# Patient Record
Sex: Male | Born: 1945 | Race: White | Hispanic: No | Marital: Married | Smoking: Former smoker
Health system: Southern US, Community
[De-identification: ages and names within clinical notes are randomized; demographics above are authoritative.]

## PROBLEM LIST (undated history)

## (undated) DIAGNOSIS — J189 Pneumonia, unspecified organism: Secondary | ICD-10-CM

## (undated) DIAGNOSIS — I10 Essential (primary) hypertension: Secondary | ICD-10-CM

## (undated) DIAGNOSIS — C801 Malignant (primary) neoplasm, unspecified: Secondary | ICD-10-CM

## (undated) DIAGNOSIS — I2510 Atherosclerotic heart disease of native coronary artery without angina pectoris: Secondary | ICD-10-CM

## (undated) DIAGNOSIS — K219 Gastro-esophageal reflux disease without esophagitis: Secondary | ICD-10-CM

## (undated) DIAGNOSIS — R0602 Shortness of breath: Secondary | ICD-10-CM

## (undated) DIAGNOSIS — D141 Benign neoplasm of larynx: Secondary | ICD-10-CM

## (undated) DIAGNOSIS — H9193 Unspecified hearing loss, bilateral: Secondary | ICD-10-CM

## (undated) DIAGNOSIS — R112 Nausea with vomiting, unspecified: Secondary | ICD-10-CM

## (undated) DIAGNOSIS — I259 Chronic ischemic heart disease, unspecified: Secondary | ICD-10-CM

## (undated) DIAGNOSIS — I219 Acute myocardial infarction, unspecified: Secondary | ICD-10-CM

## (undated) DIAGNOSIS — E785 Hyperlipidemia, unspecified: Secondary | ICD-10-CM

## (undated) DIAGNOSIS — E782 Mixed hyperlipidemia: Secondary | ICD-10-CM

## (undated) DIAGNOSIS — K4090 Unilateral inguinal hernia, without obstruction or gangrene, not specified as recurrent: Secondary | ICD-10-CM

## (undated) DIAGNOSIS — J449 Chronic obstructive pulmonary disease, unspecified: Secondary | ICD-10-CM

## (undated) DIAGNOSIS — Z9889 Other specified postprocedural states: Secondary | ICD-10-CM

## (undated) HISTORY — PX: HERNIA REPAIR: SHX51

## (undated) HISTORY — PX: OTHER SURGICAL HISTORY: SHX169

## (undated) HISTORY — PX: CORONARY ARTERY BYPASS GRAFT: SHX141

## (undated) HISTORY — DX: Gastro-esophageal reflux disease without esophagitis: K21.9

## (undated) HISTORY — DX: Chronic ischemic heart disease, unspecified: I25.9

## (undated) HISTORY — DX: Atherosclerotic heart disease of native coronary artery without angina pectoris: I25.10

## (undated) HISTORY — DX: Unilateral inguinal hernia, without obstruction or gangrene, not specified as recurrent: K40.90

## (undated) HISTORY — PX: CARDIAC CATHETERIZATION: SHX172

## (undated) HISTORY — DX: Mixed hyperlipidemia: E78.2

## (undated) HISTORY — DX: Shortness of breath: R06.02

## (undated) HISTORY — DX: Essential (primary) hypertension: I10

## (undated) HISTORY — DX: Hyperlipidemia, unspecified: E78.5

## (undated) HISTORY — DX: Pneumonia, unspecified organism: J18.9

## (undated) HISTORY — DX: Acute myocardial infarction, unspecified: I21.9

## (undated) HISTORY — DX: Benign neoplasm of larynx: D14.1

---

## 1995-12-01 HISTORY — PX: CHOLECYSTECTOMY: SHX55

## 2008-08-22 ENCOUNTER — Emergency Department (HOSPITAL_COMMUNITY): Admission: EM | Admit: 2008-08-22 | Discharge: 2008-08-22 | Payer: Self-pay | Admitting: Emergency Medicine

## 2010-06-09 ENCOUNTER — Ambulatory Visit: Payer: Self-pay | Admitting: Cardiology

## 2010-10-14 ENCOUNTER — Ambulatory Visit: Payer: Self-pay | Admitting: Cardiology

## 2011-02-10 ENCOUNTER — Other Ambulatory Visit: Payer: Self-pay | Admitting: *Deleted

## 2011-02-10 ENCOUNTER — Encounter: Payer: Self-pay | Admitting: Cardiology

## 2011-02-10 DIAGNOSIS — J449 Chronic obstructive pulmonary disease, unspecified: Secondary | ICD-10-CM | POA: Insufficient documentation

## 2011-02-10 DIAGNOSIS — R079 Chest pain, unspecified: Secondary | ICD-10-CM | POA: Insufficient documentation

## 2011-02-10 DIAGNOSIS — I219 Acute myocardial infarction, unspecified: Secondary | ICD-10-CM | POA: Insufficient documentation

## 2011-02-10 DIAGNOSIS — E7800 Pure hypercholesterolemia, unspecified: Secondary | ICD-10-CM

## 2011-02-10 DIAGNOSIS — R0602 Shortness of breath: Secondary | ICD-10-CM | POA: Insufficient documentation

## 2011-02-10 DIAGNOSIS — I259 Chronic ischemic heart disease, unspecified: Secondary | ICD-10-CM | POA: Insufficient documentation

## 2011-02-10 DIAGNOSIS — E785 Hyperlipidemia, unspecified: Secondary | ICD-10-CM | POA: Insufficient documentation

## 2011-02-10 DIAGNOSIS — D141 Benign neoplasm of larynx: Secondary | ICD-10-CM | POA: Insufficient documentation

## 2011-02-13 ENCOUNTER — Other Ambulatory Visit (INDEPENDENT_AMBULATORY_CARE_PROVIDER_SITE_OTHER): Payer: Medicare Other | Admitting: *Deleted

## 2011-02-13 ENCOUNTER — Ambulatory Visit (INDEPENDENT_AMBULATORY_CARE_PROVIDER_SITE_OTHER): Payer: Medicare Other | Admitting: Cardiology

## 2011-02-13 ENCOUNTER — Encounter: Payer: Self-pay | Admitting: Cardiology

## 2011-02-13 DIAGNOSIS — E7800 Pure hypercholesterolemia, unspecified: Secondary | ICD-10-CM

## 2011-02-13 DIAGNOSIS — E785 Hyperlipidemia, unspecified: Secondary | ICD-10-CM

## 2011-02-13 DIAGNOSIS — I259 Chronic ischemic heart disease, unspecified: Secondary | ICD-10-CM

## 2011-02-13 DIAGNOSIS — J449 Chronic obstructive pulmonary disease, unspecified: Secondary | ICD-10-CM

## 2011-02-13 LAB — HEPATIC FUNCTION PANEL
ALT: 47 U/L (ref 0–53)
AST: 27 U/L (ref 0–37)
Albumin: 4.1 g/dL (ref 3.5–5.2)
Alkaline Phosphatase: 63 U/L (ref 39–117)
Bilirubin, Direct: 0.3 mg/dL (ref 0.0–0.3)
Total Bilirubin: 2.1 mg/dL — ABNORMAL HIGH (ref 0.3–1.2)
Total Protein: 6.7 g/dL (ref 6.0–8.3)

## 2011-02-13 LAB — BASIC METABOLIC PANEL
BUN: 19 mg/dL (ref 6–23)
CO2: 31 mEq/L (ref 19–32)
Calcium: 9.7 mg/dL (ref 8.4–10.5)
Chloride: 98 mEq/L (ref 96–112)
Creatinine, Ser: 1.1 mg/dL (ref 0.4–1.5)
GFR: 69.02 mL/min (ref >60.00)
Glucose, Bld: 104 mg/dL — ABNORMAL HIGH (ref 70–99)
Potassium: 3.5 mEq/L (ref 3.5–5.1)
Sodium: 138 mEq/L (ref 135–145)

## 2011-02-13 LAB — LIPID PANEL
Cholesterol: 156 mg/dL (ref 0–200)
HDL: 43.1 mg/dL (ref >39.00)
LDL Cholesterol: 88 mg/dL (ref 0–99)
Total CHOL/HDL Ratio: 4
Triglycerides: 123 mg/dL (ref 0.0–149.0)
VLDL: 24.6 mg/dL (ref 0.0–40.0)

## 2011-02-13 NOTE — Assessment & Plan Note (Signed)
The patient remains on his present medication for his COPD.  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  In the past he had not been able to tolerate any of the beta blockers because of adverse effects on his pulmonary condition.  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

## 2011-02-13 NOTE — Assessment & Plan Note (Signed)
The patient has a history of coronary artery disease and has had previous coronary artery bypass graft surgery x5 in 1996.  He remains on long-term Lipitor 20 mg daily.  He's not having any musculoskeletal side effects from the Lipitor.

## 2011-02-13 NOTE — Progress Notes (Signed)
Harrell Gave Date of Birth:  1946/09/27 [facility] / [facility] [address]   [address] [place], [place]  [address] [phone]           Fax   [phone]  HPI: This pleasant 65 year old man is seen for a scheduled 4 month followup office visit he has a history of severe COPD and he has a history of known ischemic heart disease.  He had an acute inferior wall myocardial infarction in August 1996.  He went on to have coronary artery bypass graft surgery x5 while living in [place] area.  He gets a lot of his healthcare through the [place] system.  He has had COPD since 1992.  He quit smoking then.  He has had reactive airways disease.  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  His most recent procedure was successful and he's not having any active problems at this time.  Current Outpatient Prescriptions  Medication Sig Dispense Refill  . albuterol (PROVENTIL) (5 MG/ML) 0.5% nebulizer solution Take 2.5 mg by nebulization every 6 (six) hours as needed.        Marland Kitchen aspirin 325 MG tablet Take 325 mg by mouth daily.        Marland Kitchen atorvastatin (LIPITOR) 20 MG tablet Take 20 mg by mouth daily.        . Budesonide-Formoterol Fumarate (SYMBICORT IN) Inhale into the lungs. 2puffs bid       . diltiazem (CARDIZEM) 120 MG tablet Take 120 mg by mouth daily.        . nitroGLYCERIN (NITROSTAT) 0.4 MG SL tablet Place 0.4 mg under the tongue every 5 (five) minutes as needed.        Marland Kitchen omeprazole (PRILOSEC) 20 MG capsule Take 20 mg by mouth 2 (two) times daily.        . Potassium Chloride (KLOR-CON PO) Take by mouth daily. 1-2 DAILY       . Tiotropium Bromide Monohydrate (SPIRIVA HANDIHALER IN) Inhale into the lungs daily.        Marland Kitchen triamterene-hydrochlorothiazide (MAXZIDE-25) 37.5-25 MG per tablet Take 1 tablet by mouth daily.        . formoterol (FORADIL AEROLIZER) 12 MCG capsule for inhaler  Place 12 mcg into inhaler and inhale 2 (two) times daily.        . mometasone (ASMANEX 120 METERED DOSES) 220 MCG/INH inhaler Inhale 2 puffs into the lungs daily.          No Known Allergies  Patient Active Problem List  Diagnoses  . Ischemic heart disease  . MI (myocardial infarction)  . COPD, severe  . Papilloma of larynx  . Chest pain  . SOB (shortness of breath) on exertion  . Dyslipidemia    History  Smoking status  . Former Smoker  . Quit date: 10/30/1990  Smokeless tobacco  . Not on file    History  Alcohol Use No    No family history on file.  Review of Systems: The patient denies any heat or cold intolerance.  No weight gain or weight loss.  The patient denies headaches or blurry vision.  There is no cough or sputum production.  The patient denies dizziness.  There is no hematuria or hematochezia.  The patient denies any muscle aches or arthritis.  The patient denies any rash.  The patient denies frequent falling or instability.  There is no  history of depression or anxiety.  All other systems were reviewed and are negative.   Physical Exam: Filed Vitals:   02/13/11 0930  BP: 120/70  Pulse: 76  Weight is 1:30, down 4 pounds.Pupils equal and reactive.   Extraocular Movements are full.  There is no scleral icterus.  The mouth and pharynx are normal.  The neck is supple.  The carotids reveal no bruits.  The jugular venous pressure is normal.  The thyroid is not enlarged.  There is no lymphadenopathy.The chest is clear to percussion and auscultation. There are no rales or rhonchi. Expansion of the chest is symmetrical.  He does have minimal mild expiratory wheeze but is in no respiratory distress.The precordium is quiet.  The first heart sound is normal.  The second heart sound is physiologically split.  There is no murmur gallop rub or click.  There is no abnormal lift or heave.The abdomen is soft and nontender. Bowel sounds are normal. The liver and spleen are not  enlarged. There Are no abdominal masses. There are no bruits.The pedal pulses are good.  There is no phlebitis or edema.  There is no cyanosis or clubbing.  Neurological reveals fine intention tremor.    Assessment / Plan: Continue present medication.  Blood work today pending.  Recheck 4 months

## 2011-02-13 NOTE — Assessment & Plan Note (Signed)
The patient denies any chest pain or shortness of breath.  He's not had any palpitations or tachycardia.  He denies any dizzy spells or syncope.

## 2011-02-17 ENCOUNTER — Other Ambulatory Visit: Payer: Self-pay | Admitting: Cardiology

## 2011-02-17 DIAGNOSIS — E7800 Pure hypercholesterolemia, unspecified: Secondary | ICD-10-CM

## 2011-02-17 NOTE — Telephone Encounter (Signed)
Spoke with Dr. Elease Hashimoto regarding override, said it was ok. escribe request

## 2011-02-20 ENCOUNTER — Telehealth: Payer: Self-pay | Admitting: *Deleted

## 2011-02-20 NOTE — Progress Notes (Signed)
Advised of labs 

## 2011-02-20 NOTE — Telephone Encounter (Signed)
Advised patient of labs, watch sweets and carbs

## 2011-02-20 NOTE — Telephone Encounter (Signed)
Message copied by Regis Bill on Mon Feb 20, 2011  8:57 AM ------      Message from: Cassell Clement      Created: Mon Feb 13, 2011  6:32 PM       Blood work is satisfactory.  Continue present medication

## 2011-05-22 ENCOUNTER — Telehealth: Payer: Self-pay | Admitting: Cardiology

## 2011-05-22 NOTE — Telephone Encounter (Signed)
Called needing a refill of the generic version of Lipitor because it is cheaper [facility] in [place] [phone]. Please call back. I have pulled the chart.

## 2011-05-22 NOTE — Telephone Encounter (Signed)
Called pharmacy and ok'd generic Lipitor.  Advised wife

## 2011-08-01 LAB — CBC
HCT: 45
Hemoglobin: 15.5
MCHC: 34.5
MCV: 93.4
Platelets: 265
RBC: 4.82
RDW: 13.8
WBC: 8.6

## 2011-08-01 LAB — POCT I-STAT, CHEM 8
BUN: 18
Calcium, Ion: 1.15
Chloride: 102
Creatinine, Ser: 1.2
Glucose, Bld: 108 — ABNORMAL HIGH
HCT: 44
Hemoglobin: 15
Potassium: 3.5
Sodium: 139
TCO2: 28

## 2011-08-01 LAB — DIFFERENTIAL
Basophils Absolute: 0
Basophils Relative: 0
Eosinophils Absolute: 0.1
Eosinophils Relative: 1
Lymphocytes Relative: 6 — ABNORMAL LOW
Lymphs Abs: 0.6 — ABNORMAL LOW
Monocytes Absolute: 0.6
Monocytes Relative: 6
Neutro Abs: 7.4
Neutrophils Relative %: 86 — ABNORMAL HIGH

## 2011-08-01 LAB — POCT CARDIAC MARKERS
CKMB, poc: 1.6
Myoglobin, poc: 92
Troponin i, poc: <0.05

## 2011-08-01 LAB — B-NATRIURETIC PEPTIDE (CONVERTED LAB): Pro B Natriuretic peptide (BNP): 91

## 2011-08-01 LAB — D-DIMER, QUANTITATIVE: D-Dimer, Quant: 0.43

## 2011-08-03 ENCOUNTER — Ambulatory Visit (INDEPENDENT_AMBULATORY_CARE_PROVIDER_SITE_OTHER): Payer: Medicare Other | Admitting: Cardiology

## 2011-08-03 ENCOUNTER — Encounter: Payer: Self-pay | Admitting: Cardiology

## 2011-08-03 VITALS — BP 128/72 | HR 70 | Ht 62.0 in | Wt 128.0 lb

## 2011-08-03 DIAGNOSIS — I259 Chronic ischemic heart disease, unspecified: Secondary | ICD-10-CM

## 2011-08-03 DIAGNOSIS — J449 Chronic obstructive pulmonary disease, unspecified: Secondary | ICD-10-CM

## 2011-08-03 DIAGNOSIS — R0602 Shortness of breath: Secondary | ICD-10-CM

## 2011-08-03 DIAGNOSIS — D141 Benign neoplasm of larynx: Secondary | ICD-10-CM

## 2011-08-03 DIAGNOSIS — R079 Chest pain, unspecified: Secondary | ICD-10-CM

## 2011-08-03 DIAGNOSIS — E785 Hyperlipidemia, unspecified: Secondary | ICD-10-CM

## 2011-08-03 LAB — HEPATIC FUNCTION PANEL
ALT: 17 U/L (ref 0–53)
AST: 22 U/L (ref 0–37)
Albumin: 4.4 g/dL (ref 3.5–5.2)
Alkaline Phosphatase: 68 U/L (ref 39–117)
Bilirubin, Direct: 0.2 mg/dL (ref 0.0–0.3)
Total Bilirubin: 2.4 mg/dL — ABNORMAL HIGH (ref 0.3–1.2)
Total Protein: 7.4 g/dL (ref 6.0–8.3)

## 2011-08-03 LAB — BASIC METABOLIC PANEL
BUN: 16 mg/dL (ref 6–23)
CO2: 30 mEq/L (ref 19–32)
Calcium: 9.4 mg/dL (ref 8.4–10.5)
Chloride: 101 mEq/L (ref 96–112)
Creatinine, Ser: 1.1 mg/dL (ref 0.4–1.5)
GFR: 72.62 mL/min (ref >60.00)
Glucose, Bld: 115 mg/dL — ABNORMAL HIGH (ref 70–99)
Potassium: 3.5 mEq/L (ref 3.5–5.1)
Sodium: 139 mEq/L (ref 135–145)

## 2011-08-03 LAB — LIPID PANEL
Cholesterol: 165 mg/dL (ref 0–200)
HDL: 54.4 mg/dL (ref >39.00)
LDL Cholesterol: 91 mg/dL (ref 0–99)
Total CHOL/HDL Ratio: 3
Triglycerides: 99 mg/dL (ref 0.0–149.0)
VLDL: 19.8 mg/dL (ref 0.0–40.0)

## 2011-08-03 NOTE — Progress Notes (Signed)
William Lee Date of Birth:  10/16/1946 [facility] / [facility] [address]   [address] [place], [place]  [address] [phone]           Fax   [phone]  History of Present Illness: This pleasant 65 year old gentleman is seen for a scheduled office visit.  He has a history of severe COPD.  He also has a history of known ischemic heart disease.  He had an acute inferior wall myocardial infarction in August, 1996.  He had subsequent coronary bypass graft surgery.  He was living in [place] state at that time.  He has had a history of COPD since 1962.  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  These have required recurrent surgical procedures, and he actually has another surgery scheduled for next week.  The patient has not been expressing any chest pain.  Current Outpatient Prescriptions  Medication Sig Dispense Refill  . albuterol (PROVENTIL) (5 MG/ML) 0.5% nebulizer solution Take 2.5 mg by nebulization every 6 (six) hours as needed.        Marland Kitchen aspirin 325 MG tablet Take 325 mg by mouth daily.        . Budesonide-Formoterol Fumarate (SYMBICORT IN) Inhale into the lungs. 2puffs bid       . diltiazem (CARDIZEM) 120 MG tablet Take 120 mg by mouth daily.        Marland Kitchen LIPITOR 20 MG tablet TAKE 1 TABLET EVERY DAY  30 tablet  12  . nitroGLYCERIN (NITROSTAT) 0.4 MG SL tablet Place 0.4 mg under the tongue every 5 (five) minutes as needed.        Marland Kitchen omeprazole (PRILOSEC) 20 MG capsule Take 20 mg by mouth 2 (two) times daily.        . Potassium Chloride (KLOR-CON PO) Take by mouth daily. 1-2 DAILY       . Tiotropium Bromide Monohydrate (SPIRIVA HANDIHALER IN) Inhale into the lungs daily.        Marland Kitchen triamterene-hydrochlorothiazide (MAXZIDE-25) 37.5-25 MG per tablet Take 1 tablet by mouth daily.          No Known Allergies  Patient Active Problem List  Diagnoses  . Ischemic heart disease  . MI (myocardial infarction)  . COPD, severe  . Papilloma of larynx  . Chest  pain  . SOB (shortness of breath) on exertion  . Dyslipidemia    History  Smoking status  . Former Smoker  . Quit date: 10/30/1990  Smokeless tobacco  . Not on file    History  Alcohol Use No    No family history on file.  Review of Systems: Constitutional: no fever chills diaphoresis or fatigue or change in weight.  Head and neck: no hearing loss, no epistaxis, no photophobia or visual disturbance. Respiratory: No cough, shortness of breath or wheezing. Cardiovascular: No chest pain peripheral edema, palpitations. Gastrointestinal: No abdominal distention, no abdominal pain, no change in bowel habits hematochezia or melena. Genitourinary: No dysuria, no frequency, no urgency, no nocturia. Musculoskeletal:No arthralgias, no back pain, no gait disturbance or myalgias. Neurological: No dizziness, no headaches, no numbness, no seizures, no syncope, no weakness, no tremors. Hematologic: No lymphadenopathy, no easy bruising. Psychiatric: No confusion, no hallucinations, no sleep disturbance.    Physical Exam: Filed Vitals:   08/03/11 1029  BP: 128/72  Pulse: 70   The general appearance reveals a well-developed, well-nourished, in no distress.Pupils equal and reactive.   Extraocular Movements are full.  There is no scleral  icterus.  The mouth and pharynx are normal.  The neck is supple.  The carotids reveal no bruits.  The jugular venous pressure is normal.  The thyroid is not enlarged.  There is no lymphadenopathy.  The chest is clear to percussion and auscultation. There are no rales or rhonchi. Expansion of the chest is symmetrical.  The precordium is quiet.  The first heart sound is normal.  The second heart sound is physiologically split.  There is no murmur gallop rub or click.  There is no abnormal lift or heave.  The abdomen is soft and nontender. Bowel sounds are normal. The liver and spleen are not enlarged. There Are no abdominal masses. There are no bruits  The  pedal pulses are good.  There is no phlebitis or edema.  There is no cyanosis or clubbing. Strength is normal and symmetrical in all extremities.  There is no lateralizing weakness.  There are no sensory deficits.The skin is warm and dry.  There is no rash.     Assessment / Plan:

## 2011-08-03 NOTE — Assessment & Plan Note (Signed)
The patient has chronic musculoskeletal suggestible pain, but no ischemic pain

## 2011-08-03 NOTE — Assessment & Plan Note (Signed)
The patient has a history of COPD.  He denies any purulent sputum.  He has had no hemoptysis.

## 2011-08-03 NOTE — Assessment & Plan Note (Signed)
The patient has surgery scheduled for next week at his ENT physician in [facility]

## 2011-08-03 NOTE — Patient Instructions (Signed)
continue same dose of medications  Your physician wants you to follow-up in: 6 months You will receive a reminder letter in the mail two months in advance. If you don't receive a letter, please call our office to schedule the follow-up appointment.  

## 2011-08-11 ENCOUNTER — Telehealth: Payer: Self-pay | Admitting: *Deleted

## 2011-08-11 NOTE — Progress Notes (Signed)
Advised of labs 

## 2011-08-11 NOTE — Telephone Encounter (Signed)
Message copied by Burnell Blanks on Fri Aug 11, 2011 10:27 AM ------      Message from: Cassell Clement      Created: Fri Aug 04, 2011  5:46 PM       Please report.  Lipids are normal.  Liver functions are normal.  The bilirubin is chronically high and is of no concern.  The blood sugar is at high at 115.  Continue present regimen

## 2011-08-11 NOTE — Telephone Encounter (Signed)
Advised of labs 

## 2011-11-11 ENCOUNTER — Inpatient Hospital Stay (HOSPITAL_COMMUNITY)
Admission: EM | Admit: 2011-11-11 | Discharge: 2011-11-12 | DRG: 313 | Disposition: A | Discharge status: Home / Self Care | Payer: Medicare Other | Attending: Internal Medicine | Admitting: Internal Medicine

## 2011-11-11 ENCOUNTER — Encounter (HOSPITAL_COMMUNITY): Payer: Self-pay | Admitting: *Deleted

## 2011-11-11 ENCOUNTER — Other Ambulatory Visit: Payer: Self-pay

## 2011-11-11 ENCOUNTER — Emergency Department (HOSPITAL_COMMUNITY): Payer: Medicare Other

## 2011-11-11 DIAGNOSIS — Z87891 Personal history of nicotine dependence: Secondary | ICD-10-CM

## 2011-11-11 DIAGNOSIS — I252 Old myocardial infarction: Secondary | ICD-10-CM

## 2011-11-11 DIAGNOSIS — R0789 Other chest pain: Principal | ICD-10-CM | POA: Diagnosis present

## 2011-11-11 DIAGNOSIS — E785 Hyperlipidemia, unspecified: Secondary | ICD-10-CM | POA: Diagnosis present

## 2011-11-11 DIAGNOSIS — R079 Chest pain, unspecified: Secondary | ICD-10-CM | POA: Diagnosis present

## 2011-11-11 DIAGNOSIS — I259 Chronic ischemic heart disease, unspecified: Secondary | ICD-10-CM

## 2011-11-11 DIAGNOSIS — J4489 Other specified chronic obstructive pulmonary disease: Secondary | ICD-10-CM | POA: Diagnosis present

## 2011-11-11 DIAGNOSIS — D141 Benign neoplasm of larynx: Secondary | ICD-10-CM

## 2011-11-11 DIAGNOSIS — J449 Chronic obstructive pulmonary disease, unspecified: Secondary | ICD-10-CM | POA: Diagnosis present

## 2011-11-11 DIAGNOSIS — Z9861 Coronary angioplasty status: Secondary | ICD-10-CM

## 2011-11-11 DIAGNOSIS — I2589 Other forms of chronic ischemic heart disease: Secondary | ICD-10-CM | POA: Diagnosis present

## 2011-11-11 DIAGNOSIS — I10 Essential (primary) hypertension: Secondary | ICD-10-CM | POA: Diagnosis present

## 2011-11-11 DIAGNOSIS — R0602 Shortness of breath: Secondary | ICD-10-CM | POA: Diagnosis present

## 2011-11-11 DIAGNOSIS — Z951 Presence of aortocoronary bypass graft: Secondary | ICD-10-CM

## 2011-11-11 DIAGNOSIS — I219 Acute myocardial infarction, unspecified: Secondary | ICD-10-CM

## 2011-11-11 DIAGNOSIS — J189 Pneumonia, unspecified organism: Secondary | ICD-10-CM | POA: Diagnosis present

## 2011-11-11 HISTORY — DX: Chronic obstructive pulmonary disease, unspecified: J44.9

## 2011-11-11 HISTORY — DX: Shortness of breath: R06.02

## 2011-11-11 LAB — LIPID PANEL
Cholesterol: 149 mg/dL (ref 0–200)
HDL: 50 mg/dL (ref >39)
LDL Cholesterol: 78 mg/dL (ref 0–99)
Total CHOL/HDL Ratio: 3 RATIO
Triglycerides: 103 mg/dL (ref <150)
VLDL: 21 mg/dL (ref 0–40)

## 2011-11-11 LAB — COMPREHENSIVE METABOLIC PANEL
ALT: 15 U/L (ref 0–53)
ALT: 220 U/L — ABNORMAL HIGH (ref 0–53)
AST: 15 U/L (ref 0–37)
AST: 341 U/L — ABNORMAL HIGH (ref 0–37)
Albumin: 3.6 g/dL (ref 3.5–5.2)
Albumin: 3.2 g/dL — ABNORMAL LOW (ref 3.5–5.2)
Alkaline Phosphatase: 76 U/L (ref 39–117)
Alkaline Phosphatase: 140 U/L — ABNORMAL HIGH (ref 39–117)
BUN: 12 mg/dL (ref 6–23)
BUN: 11 mg/dL (ref 6–23)
CO2: 31 mEq/L (ref 19–32)
CO2: 29 mEq/L (ref 19–32)
Calcium: 9.1 mg/dL (ref 8.4–10.5)
Calcium: 8.7 mg/dL (ref 8.4–10.5)
Chloride: 99 mEq/L (ref 96–112)
Chloride: 103 mEq/L (ref 96–112)
Creatinine, Ser: 0.97 mg/dL (ref 0.50–1.35)
Creatinine, Ser: 1.04 mg/dL (ref 0.50–1.35)
GFR calc Af Amer: >90 mL/min (ref >90)
GFR calc Af Amer: 84 mL/min — ABNORMAL LOW (ref >90)
GFR calc non Af Amer: 84 mL/min — ABNORMAL LOW (ref >90)
GFR calc non Af Amer: 73 mL/min — ABNORMAL LOW (ref >90)
Glucose, Bld: 130 mg/dL — ABNORMAL HIGH (ref 70–99)
Glucose, Bld: 130 mg/dL — ABNORMAL HIGH (ref 70–99)
Potassium: 2.8 mEq/L — ABNORMAL LOW (ref 3.5–5.1)
Potassium: 3.5 mEq/L (ref 3.5–5.1)
Sodium: 140 mEq/L (ref 135–145)
Sodium: 139 mEq/L (ref 135–145)
Total Bilirubin: 1.4 mg/dL — ABNORMAL HIGH (ref 0.3–1.2)
Total Bilirubin: 1.7 mg/dL — ABNORMAL HIGH (ref 0.3–1.2)
Total Protein: 6.8 g/dL (ref 6.0–8.3)
Total Protein: 6.2 g/dL (ref 6.0–8.3)

## 2011-11-11 LAB — APTT
aPTT: 28 seconds (ref 24–37)
aPTT: 29 seconds (ref 24–37)

## 2011-11-11 LAB — CBC
HCT: 43 % (ref 39.0–52.0)
HCT: 41.5 % (ref 39.0–52.0)
Hemoglobin: 15.3 g/dL (ref 13.0–17.0)
Hemoglobin: 14.2 g/dL (ref 13.0–17.0)
MCH: 32 pg (ref 26.0–34.0)
MCH: 31.3 pg (ref 26.0–34.0)
MCHC: 35.6 g/dL (ref 30.0–36.0)
MCHC: 34.2 g/dL (ref 30.0–36.0)
MCV: 90 fL (ref 78.0–100.0)
MCV: 91.4 fL (ref 78.0–100.0)
Platelets: 197 10*3/uL (ref 150–400)
Platelets: 179 10*3/uL (ref 150–400)
RBC: 4.78 MIL/uL (ref 4.22–5.81)
RBC: 4.54 MIL/uL (ref 4.22–5.81)
RDW: 13.8 % (ref 11.5–15.5)
RDW: 14 % (ref 11.5–15.5)
WBC: 9.1 10*3/uL (ref 4.0–10.5)
WBC: 6.4 10*3/uL (ref 4.0–10.5)

## 2011-11-11 LAB — CARDIAC PANEL(CRET KIN+CKTOT+MB+TROPI)
CK, MB: 3.3 ng/mL (ref 0.3–4.0)
CK, MB: 3 ng/mL (ref 0.3–4.0)
CK, MB: 3 ng/mL (ref 0.3–4.0)
Relative Index: 2.3 (ref 0.0–2.5)
Relative Index: INVALID (ref 0.0–2.5)
Relative Index: INVALID (ref 0.0–2.5)
Total CK: 145 U/L (ref 7–232)
Total CK: 68 U/L (ref 7–232)
Total CK: 62 U/L (ref 7–232)
Troponin I: <0.3 ng/mL (ref <0.30)
Troponin I: <0.3 ng/mL (ref <0.30)
Troponin I: <0.3 ng/mL (ref <0.30)

## 2011-11-11 LAB — DIFFERENTIAL
Basophils Absolute: 0 10*3/uL (ref 0.0–0.1)
Basophils Relative: 0 % (ref 0–1)
Eosinophils Absolute: 0.3 10*3/uL (ref 0.0–0.7)
Eosinophils Relative: 4 % (ref 0–5)
Lymphocytes Relative: 9 % — ABNORMAL LOW (ref 12–46)
Lymphs Abs: 0.6 10*3/uL — ABNORMAL LOW (ref 0.7–4.0)
Monocytes Absolute: 0.6 10*3/uL (ref 0.1–1.0)
Monocytes Relative: 9 % (ref 3–12)
Neutro Abs: 5 10*3/uL (ref 1.7–7.7)
Neutrophils Relative %: 78 % — ABNORMAL HIGH (ref 43–77)

## 2011-11-11 LAB — PROTIME-INR
INR: 1.07 (ref 0.00–1.49)
INR: 1.16 (ref 0.00–1.49)
Prothrombin Time: 14.1 seconds (ref 11.6–15.2)
Prothrombin Time: 15 seconds (ref 11.6–15.2)

## 2011-11-11 LAB — PRO B NATRIURETIC PEPTIDE: Pro B Natriuretic peptide (BNP): 480.5 pg/mL — ABNORMAL HIGH (ref 0–125)

## 2011-11-11 LAB — POCT I-STAT TROPONIN I: Troponin i, poc: 0 ng/mL (ref 0.00–0.08)

## 2011-11-11 LAB — MAGNESIUM
Magnesium: 2.1 mg/dL (ref 1.5–2.5)
Magnesium: 2.1 mg/dL (ref 1.5–2.5)

## 2011-11-11 LAB — PHOSPHORUS: Phosphorus: 2.1 mg/dL — ABNORMAL LOW (ref 2.3–4.6)

## 2011-11-11 LAB — D-DIMER, QUANTITATIVE: D-Dimer, Quant: 0.31 ug/mL-FEU (ref 0.00–0.48)

## 2011-11-11 LAB — TSH: TSH: 1.174 u[IU]/mL (ref 0.350–4.500)

## 2011-11-11 LAB — HEMOGLOBIN A1C
Hgb A1c MFr Bld: 5.8 % — ABNORMAL HIGH (ref <5.7)
Mean Plasma Glucose: 120 mg/dL — ABNORMAL HIGH (ref <117)

## 2011-11-11 MED ORDER — ONDANSETRON HCL 4 MG PO TABS: 4.0000 mg | ORAL_TABLET | Freq: Four times a day (QID) | ORAL | Status: DC | PRN

## 2011-11-11 MED ORDER — KETOROLAC TROMETHAMINE 30 MG/ML IJ SOLN
30.0000 mg | Freq: Once | INTRAMUSCULAR | Status: AC
  Administered 2011-11-11: 30 mg via INTRAVENOUS
  Filled 2011-11-11: qty 1

## 2011-11-11 MED ORDER — MORPHINE SULFATE 4 MG/ML IJ SOLN
2.0000 mg | Freq: Once | INTRAMUSCULAR | Status: AC
  Administered 2011-11-11: 2 mg via INTRAVENOUS
  Filled 2011-11-11: qty 1

## 2011-11-11 MED ORDER — MOXIFLOXACIN HCL IN NACL 400 MG/250ML IV SOLN
400.0000 mg | Freq: Once | INTRAVENOUS | Status: AC
  Administered 2011-11-11: 400 mg via INTRAVENOUS
  Filled 2011-11-11: qty 250

## 2011-11-11 MED ORDER — TRIAMTERENE-HCTZ 37.5-25 MG PO TABS
1.0000 | ORAL_TABLET | Freq: Every day | ORAL | Status: DC
  Administered 2011-11-11 – 2011-11-12 (×2): 1 via ORAL
  Filled 2011-11-11 (×2): qty 1

## 2011-11-11 MED ORDER — ALBUTEROL SULFATE (5 MG/ML) 0.5% IN NEBU: 2.5000 mg | INHALATION_SOLUTION | Freq: Four times a day (QID) | RESPIRATORY_TRACT | Status: DC | PRN

## 2011-11-11 MED ORDER — SIMVASTATIN 40 MG PO TABS
40.0000 mg | ORAL_TABLET | Freq: Every day | ORAL | Status: DC
  Filled 2011-11-11: qty 1

## 2011-11-11 MED ORDER — POTASSIUM CHLORIDE CRYS ER 20 MEQ PO TBCR
40.0000 meq | EXTENDED_RELEASE_TABLET | Freq: Once | ORAL | Status: AC
  Administered 2011-11-11: 40 meq via ORAL
  Filled 2011-11-11: qty 2

## 2011-11-11 MED ORDER — BUDESONIDE-FORMOTEROL FUMARATE 160-4.5 MCG/ACT IN AERO
2.0000 | INHALATION_SPRAY | Freq: Two times a day (BID) | RESPIRATORY_TRACT | Status: DC
  Administered 2011-11-11 – 2011-11-12 (×2): 2 via RESPIRATORY_TRACT
  Filled 2011-11-11 (×2): qty 6

## 2011-11-11 MED ORDER — PANTOPRAZOLE SODIUM 40 MG PO TBEC
40.0000 mg | DELAYED_RELEASE_TABLET | Freq: Every day | ORAL | Status: DC
  Administered 2011-11-11 – 2011-11-12 (×2): 40 mg via ORAL
  Filled 2011-11-11 (×2): qty 1

## 2011-11-11 MED ORDER — ASPIRIN 325 MG PO TABS
325.0000 mg | ORAL_TABLET | Freq: Every day | ORAL | Status: DC
  Administered 2011-11-11 – 2011-11-12 (×2): 325 mg via ORAL
  Filled 2011-11-11 (×2): qty 1

## 2011-11-11 MED ORDER — ENOXAPARIN SODIUM 30 MG/0.3ML ~~LOC~~ SOLN
30.0000 mg | SUBCUTANEOUS | Status: DC
  Administered 2011-11-11: 30 mg via SUBCUTANEOUS
  Filled 2011-11-11 (×2): qty 0.3

## 2011-11-11 MED ORDER — NITROGLYCERIN 0.4 MG SL SUBL
0.4000 mg | SUBLINGUAL_TABLET | SUBLINGUAL | Status: DC | PRN
  Administered 2011-11-11: 0.4 mg via SUBLINGUAL
  Filled 2011-11-11 (×2): qty 25

## 2011-11-11 MED ORDER — DILTIAZEM HCL 60 MG PO TABS
120.0000 mg | ORAL_TABLET | Freq: Every day | ORAL | Status: DC
  Administered 2011-11-11: 120 mg via ORAL
  Filled 2011-11-11: qty 2

## 2011-11-11 MED ORDER — BIOTENE DRY MOUTH MT LIQD
15.0000 mL | Freq: Two times a day (BID) | OROMUCOSAL | Status: DC
  Administered 2011-11-12: 15 mL via OROMUCOSAL

## 2011-11-11 MED ORDER — POTASSIUM CHLORIDE 20 MEQ PO PACK: 20.0000 meq | PACK | Freq: Every day | ORAL | Status: DC

## 2011-11-11 MED ORDER — POTASSIUM CHLORIDE CRYS ER 20 MEQ PO TBCR
20.0000 meq | EXTENDED_RELEASE_TABLET | Freq: Every day | ORAL | Status: DC
  Administered 2011-11-11 – 2011-11-12 (×2): 20 meq via ORAL
  Filled 2011-11-11 (×2): qty 1

## 2011-11-11 MED ORDER — DILTIAZEM HCL ER COATED BEADS 120 MG PO CP24
120.0000 mg | ORAL_CAPSULE | Freq: Every day | ORAL | Status: DC
  Administered 2011-11-11: 120 mg via ORAL
  Filled 2011-11-11 (×2): qty 1

## 2011-11-11 MED ORDER — TIOTROPIUM BROMIDE MONOHYDRATE 18 MCG IN CAPS
18.0000 ug | ORAL_CAPSULE | Freq: Every day | RESPIRATORY_TRACT | Status: DC
  Administered 2011-11-12: 18 ug via RESPIRATORY_TRACT
  Filled 2011-11-11: qty 5

## 2011-11-11 MED ORDER — ONDANSETRON HCL 4 MG/2ML IJ SOLN: 4.0000 mg | Freq: Four times a day (QID) | INTRAMUSCULAR | Status: DC | PRN

## 2011-11-11 MED ORDER — DILTIAZEM HCL ER COATED BEADS 120 MG PO CP24
120.0000 mg | ORAL_CAPSULE | Freq: Every day | ORAL | Status: DC
Start: 2011-11-11 — End: 2011-11-11
  Filled 2011-11-11: qty 1

## 2011-11-11 MED ORDER — ATORVASTATIN CALCIUM 10 MG PO TABS
20.0000 mg | ORAL_TABLET | Freq: Every day | ORAL | Status: DC
  Administered 2011-11-11: 20 mg via ORAL
  Filled 2011-11-11 (×3): qty 2

## 2011-11-11 NOTE — ED Notes (Signed)
Per EMS:  Pt from home.  States that he started to have CP at 0430 this am.  Pain in the (L) chest, radiating to the back.  Denies N/V/Diaphoresis.  Reports SOB with activity.  [place]AB[place]  18 LAC, 2SL nitro given en route, asa taken PTA.  Pt tender on palpation.

## 2011-11-11 NOTE — ED Notes (Signed)
Report given but informed that room is not cleaned at this time. RN will call back when room is ready.

## 2011-11-11 NOTE — ED Notes (Signed)
HH meal ordered for pt.

## 2011-11-11 NOTE — ED Notes (Signed)
EKG completed at [phone] and given to Dr. Jeraldine Loots along with OLD ekg.

## 2011-11-11 NOTE — H&P (Signed)
PCP:  Kaleen Mask, MD   DOA:  11/11/2011  6:05 AM  Chief Complaint:  Chest pain  HPI: 66 year old male with history of HTN, Posterior wall MI status post angioplasty presented to ED with sudden onset chest pain, while at rest, 8/10 in intensity, radiating to left arm and somewhat to right side of the chest, worse with movement. Patient reports no associated numbness or tingling sensation in left arm, no palpitations, no cough, no lightheadedness or dizziness. Patient has experienced somewhat similar symptoms in past. The pain has been constant with no alleviating factors. No complaints of abdominal pain, no nausea or vomiting, no blood in stool or urine. Patient reports no fever or chills and no sick contacts at home. Patient is admitted to hospitalist service for further evaluation and management.  Allergies: No Known Allergies  Prior to Admission medications   Medication Sig Start Date End Date Taking? Authorizing Provider  albuterol (PROVENTIL) (5 MG/ML) 0.5% nebulizer solution Take 2.5 mg by nebulization every 6 (six) hours as needed.     Yes Historical Provider, MD  aspirin 325 MG tablet Take 325 mg by mouth daily.     Yes Historical Provider, MD  atorvastatin (LIPITOR) 20 MG tablet Take 20 mg by mouth daily.   Yes Historical Provider, MD  budesonide-formoterol (SYMBICORT) 160-4.5 MCG/ACT inhaler Inhale 2 puffs into the lungs 2 (two) times daily.   Yes Historical Provider, MD  diltiazem (CARDIZEM) 120 MG tablet Take 120 mg by mouth daily.     Yes Historical Provider, MD  nitroGLYCERIN (NITROSTAT) 0.4 MG SL tablet Place 0.4 mg under the tongue every 5 (five) minutes as needed.     Yes Historical Provider, MD  omeprazole (PRILOSEC) 20 MG capsule Take 20 mg by mouth 2 (two) times daily. Take one capsule in the morning and 2 capsules in the evening   Yes Historical Provider, MD  potassium chloride (KLOR-CON) 20 MEQ packet Take 20 mEq by mouth daily.   Yes Historical Provider, MD    Tiotropium Bromide Monohydrate (SPIRIVA HANDIHALER IN) Inhale 1 capsule into the lungs daily.    Yes Historical Provider, MD  triamterene-hydrochlorothiazide (MAXZIDE-25) 37.5-25 MG per tablet Take 1 tablet by mouth daily.     Yes Historical Provider, MD    Past Medical History  Diagnosis Date  . Ischemic heart disease   . MI (myocardial infarction)   . COPD, severe   . Papilloma of larynx   . Chest pain   . SOB (shortness of breath) on exertion   . Dyslipidemia   . COPD (chronic obstructive pulmonary disease)     Past Surgical History  Procedure Date  . Coronary artery bypass graft     Social History:  reports that he quit smoking about 21 years ago. He does not have any smokeless tobacco history on file. He reports that he does not drink alcohol or use illicit drugs.  History reviewed. No pertinent family history.  Review of Systems:  Constitutional: Denies fever, chills, diaphoresis, appetite change and fatigue.  HEENT: Denies photophobia, eye pain, redness, hearing loss, ear pain, congestion, sore throat, rhinorrhea, sneezing, mouth sores, trouble swallowing, neck pain, neck stiffness and tinnitus.   Respiratory: Denies SOB, DOE, cough, chest tightness,  and wheezing.   Cardiovascular: as per HPI  Gastrointestinal: Denies nausea, vomiting, abdominal pain, diarrhea, constipation, blood in stool and abdominal distention.  Genitourinary: Denies dysuria, urgency, frequency, hematuria, flank pain and difficulty urinating.  Musculoskeletal: Denies myalgias, back pain, joint swelling, arthralgias  and gait problem.  Skin: Denies pallor, rash and wound.  Neurological: Denies dizziness, seizures, syncope, weakness, light-headedness, numbness and headaches.  Hematological: Denies adenopathy. Easy bruising, personal or family bleeding history  Psychiatric/Behavioral: Denies suicidal ideation, mood changes, confusion, nervousness, sleep disturbance and agitation   Physical  Exam:  Filed Vitals:   11/11/11 0915 11/11/11 1023 11/11/11 1115 11/11/11 1338  BP: 153/83 131/74 131/74 133/78  Pulse: 84   79  Temp:   98.3 F (36.8 C) 97.7 F (36.5 C)  TempSrc:   Oral Oral  Resp: 18   18  Height:    5\' 3"  (1.6 m)  Weight:    57.2 kg (126 lb 1.7 oz)  SpO2: 100% 100% 100% 98%    Constitutional: Vital signs reviewed.  Patient is a well-developed and well-nourished in no acute distress and cooperative with exam. Alert and oriented x3.  Head: Normocephalic and atraumatic Ear: TM normal bilaterally Mouth: no erythema or exudates, MMM Eyes: PERRL, EOMI, conjunctivae normal, No scleral icterus.  Neck: Supple, Trachea midline normal ROM, No JVD, mass, thyromegaly, or carotid bruit present.  Cardiovascular: RRR, S1 normal, S2 normal, no MRG, pulses symmetric and intact bilaterally Pulmonary/Chest: CTAB, no wheezes, rales, or rhonchi Abdominal: Soft. Non-tender, non-distended, bowel sounds are normal, no masses, organomegaly, or guarding present.  GU: no CVA tenderness Musculoskeletal: No joint deformities, erythema, or stiffness, ROM full and no nontender Ext: no edema and no cyanosis, pulses palpable bilaterally (DP and PT) Hematology: no cervical, inginal, or axillary adenopathy.  Neurological: A&O x3, Strenght is normal and symmetric bilaterally, cranial nerve II-XII are grossly intact, no focal motor deficit, sensory intact to light touch bilaterally.  Skin: Warm, dry and intact. No rash, cyanosis, or clubbing.  Psychiatric: Normal mood and affect. speech and behavior is normal. Judgment and thought content normal. Cognition and memory are normal.   Labs on Admission:  Results for orders placed during the hospital encounter of 11/11/11 (from the past 48 hour(s))  CBC     Status: Normal   Collection Time   11/11/11  6:12 AM      Component Value Range Comment   WBC 9.1  4.0 - 10.5 (K/uL)    RBC 4.78  4.22 - 5.81 (MIL/uL)    Hemoglobin 15.3  13.0 - 17.0 (g/dL)     HCT 45.4  09.8 - 11.9 (%)    MCV 90.0  78.0 - 100.0 (fL)    MCH 32.0  26.0 - 34.0 (pg)    MCHC 35.6  30.0 - 36.0 (g/dL)    RDW 14.7  82.9 - 56.2 (%)    Platelets 197  150 - 400 (K/uL)   PROTIME-INR     Status: Normal   Collection Time   11/11/11  6:12 AM      Component Value Range Comment   Prothrombin Time 14.1  11.6 - 15.2 (seconds)    INR 1.07  0.00 - 1.49    MAGNESIUM     Status: Normal   Collection Time   11/11/11  6:12 AM      Component Value Range Comment   Magnesium 2.1  1.5 - 2.5 (mg/dL)   APTT     Status: Normal   Collection Time   11/11/11  6:12 AM      Component Value Range Comment   aPTT 28  24 - 37 (seconds)   COMPREHENSIVE METABOLIC PANEL     Status: Abnormal   Collection Time   11/11/11  6:12 AM  Component Value Range Comment   Sodium 140  135 - 145 (mEq/L)    Potassium 2.8 (*) 3.5 - 5.1 (mEq/L)    Chloride 99  96 - 112 (mEq/L)    CO2 31  19 - 32 (mEq/L)    Glucose, Bld 130 (*) 70 - 99 (mg/dL)    BUN 12  6 - 23 (mg/dL)    Creatinine, Ser 1.61  0.50 - 1.35 (mg/dL)    Calcium 9.1  8.4 - 10.5 (mg/dL)    Total Protein 6.8  6.0 - 8.3 (g/dL)    Albumin 3.6  3.5 - 5.2 (g/dL)    AST 15  0 - 37 (U/L)    ALT 15  0 - 53 (U/L)    Alkaline Phosphatase 76  39 - 117 (U/L)    Total Bilirubin 1.4 (*) 0.3 - 1.2 (mg/dL)    GFR calc non Af Amer 84 (*) >90 (mL/min)    GFR calc Af Amer >90  >90 (mL/min)   D-DIMER, QUANTITATIVE     Status: Normal   Collection Time   11/11/11  6:12 AM      Component Value Range Comment   D-Dimer, Quant 0.31  0.00 - 0.48 (ug/mL-FEU)   PRO B NATRIURETIC PEPTIDE     Status: Abnormal   Collection Time   11/11/11  6:13 AM      Component Value Range Comment   Pro B Natriuretic peptide (BNP) 480.5 (*) 0 - 125 (pg/mL)   CARDIAC PANEL(CRET KIN+CKTOT+MB+TROPI)     Status: Normal   Collection Time   11/11/11  6:20 AM      Component Value Range Comment   Total CK 145  7 - 232 (U/L)    CK, MB 3.3  0.3 - 4.0 (ng/mL)    Troponin I <0.30  <0.30 (ng/mL)     Relative Index 2.3  0.0 - 2.5    POCT I-STAT TROPONIN I     Status: Normal   Collection Time   11/11/11  9:36 AM      Component Value Range Comment   Troponin i, poc 0.00  0.00 - 0.08 (ng/mL)    Comment 3            LIPID PANEL     Status: Normal   Collection Time   11/11/11  9:56 AM      Component Value Range Comment   Cholesterol 149  0 - 200 (mg/dL)    Triglycerides 096  <150 (mg/dL)    HDL 50  >04 (mg/dL)    Total CHOL/HDL Ratio 3.0      VLDL 21  0 - 40 (mg/dL)    LDL Cholesterol 78  0 - 99 (mg/dL)   CARDIAC PANEL(CRET KIN+CKTOT+MB+TROPI)     Status: Normal   Collection Time   11/11/11  1:38 PM      Component Value Range Comment   Total CK 68  7 - 232 (U/L)    CK, MB 3.0  0.3 - 4.0 (ng/mL)    Troponin I <0.30  <0.30 (ng/mL)    Relative Index RELATIVE INDEX IS INVALID  0.0 - 2.5      Radiological Exams on Admission: No results found.  Assessment/Plan  Principal Problem:   *Chest pain - rule out ACS - the 1st set cardiac enzymes is negative - obtain 2 D ECHO - fasting lipid  Panel and TSH - replete electrolytes  Active Problems:   COPD, severe - continue home medications  Shortness of breath - possible secondary to COPD - continue management as above  Time Spent on Admission: Greater than 30 minutes  William Lee 11/11/2011, 3:06 PM

## 2011-11-11 NOTE — Progress Notes (Signed)
A/O 4x  No c/o pain on initial assessment. Lungs clear diminished to lower fields 02 in place at 2l. Tremors noted pt able to stand and ambulate w/o assist. No skin issues noted. Abd soft bowel sounds present 4x. Regular rate and rhythm noted.

## 2011-11-11 NOTE — ED Provider Notes (Signed)
History     CSN: [id number]  Arrival date & time 11/11/11  0605   First MD Initiated Contact with Patient 11/11/11 [phone]      Chief Complaint  Patient presents with  . Chest Pain    (Consider location/radiation/quality/duration/timing/severity/associated sxs/prior treatment) HPI This 69 roll male presents with chest pain. His pain began acutely 1.5 hours before presentation. The pain is focally about the left anterior chest with radiation to the left scapula. The patient notes the pain is worse with motion and deep inspiration. No shortness of breath, no exertional worsening of the pain. No attempts at analgesia thus far. The pain is described as sharp. No nausea, no vomiting, no near syncope. The patient notes that he has been in his usual state of health prior to this morning Past Medical History  Diagnosis Date  . Ischemic heart disease   . MI (myocardial infarction)   . COPD, severe   . Papilloma of larynx   . Chest pain   . SOB (shortness of breath) on exertion   . Dyslipidemia   . COPD (chronic obstructive pulmonary disease)     Past Surgical History  Procedure Date  . Coronary artery bypass graft     History reviewed. No pertinent family history.  History  Substance Use Topics  . Smoking status: Former Smoker    Quit date: 10/30/1990  . Smokeless tobacco: Not on file  . Alcohol Use: No      Review of Systems  Constitutional:       Per HPI, otherwise negative  HENT:       Per HPI, otherwise negative  Eyes: Negative.   Respiratory:       Per HPI, otherwise negative  Cardiovascular:       Per HPI, otherwise negative  Gastrointestinal: Negative for vomiting.  Genitourinary: Negative.   Musculoskeletal:       Per HPI, otherwise negative  Skin: Negative.   Neurological: Negative for syncope.    Allergies  Review of patient's allergies indicates no known allergies.  Home Medications   Current Outpatient Rx  Name Route Sig Dispense Refill  .  ALBUTEROL SULFATE (5 MG/ML) 0.5% IN NEBU Nebulization Take 2.5 mg by nebulization every 6 (six) hours as needed.      . ASPIRIN 325 MG PO TABS Oral Take 325 mg by mouth daily.      [place] IN Inhalation Inhale into the lungs. 2puffs bid     . DILTIAZEM HCL 120 MG PO TABS Oral Take 120 mg by mouth daily.      Marland Kitchen LIPITOR 20 MG PO TABS  TAKE 1 TABLET EVERY DAY 30 tablet 12  . NITROGLYCERIN 0.4 MG SL SUBL Sublingual Place 0.4 mg under the tongue every 5 (five) minutes as needed.      Marland Kitchen OMEPRAZOLE 20 MG PO CPDR Oral Take 20 mg by mouth 2 (two) times daily.      Marland Kitchen KLOR-CON PO Oral Take by mouth daily. 1-2 DAILY     . SPIRIVA HANDIHALER IN Inhalation Inhale into the lungs daily.      . TRIAMTERENE-HCTZ 37.5-25 MG PO TABS Oral Take 1 tablet by mouth daily.        There were no vitals taken for this visit.  Physical Exam  Nursing note and vitals reviewed. Constitutional: He is oriented to person, place, and time. He appears well-developed. No distress.  HENT:  Head: Normocephalic and atraumatic.  Eyes: Conjunctivae and EOM are normal.  Cardiovascular: Normal  rate and regular rhythm.   Pulmonary/Chest: Effort normal. No stridor. No respiratory distress.       Midline sternal scar  Abdominal: He exhibits no distension.  Musculoskeletal: He exhibits no edema.  Neurological: He is alert and oriented to person, place, and time.  Skin: Skin is warm and dry.  Psychiatric: He has a normal mood and affect.    ED Course  Procedures (including critical care time)   Labs Reviewed  CBC  CARDIAC PANEL(CRET KIN+CKTOT+MB+TROPI)  PROTIME-INR  MAGNESIUM  APTT  COMPREHENSIVE METABOLIC PANEL  PRO B NATRIURETIC PEPTIDE  D-DIMER, QUANTITATIVE   No results found.   No diagnosis found.  Cardiac Monitor: 95 -st -normal Pulse Ox 97% 3L [place] - abnormal   Date: 11/11/2011  Rate: 84  Rhythm: normal sinus rhythm  QRS Axis: left  Intervals: normal  ST/T Wave abnormalities: nonspecific T wave  changes  Conduction Disutrbances:ventricular trigeminy  Narrative Interpretation:   Old EKG Reviewed: changes noted ABNORMAL ECG  CXR: L mid lobe opacification c/w pna. MDM  This 30 old male presents with new left sided chest and back pain. On exam he is in no distress though he is mildly tachycardic. The patient has a history of both coronary artery disease and COPD. The patient is not hypoxic, and his pain improved with the ED interventions. Patient's labs and studies are suggestive of pneumonia as likely etiology.  The patient's nonischemic ECG, unremarkable cardiac enzymes are reassuring. Notably, the patient does have a mildly elevated BNP.  Patient received Moxi in ED, and was admitted for further E/M of his CP.       Gerhard Munch, MD 11/13/11 0005

## 2011-11-11 NOTE — ED Provider Notes (Signed)
This patient initially evaluated by Dr. Jeraldine Loots. Pt presented with severe chest pain. Pt had a posterior wall MI in 1996 and diagnosed with COPD in the 1960s. After completing his work up, neg cardiac enzymes, - D-dimer. low left lobe pneumonia found. Pt has received IV abx in ED. Pts chest pain never relieved. While receiving abx, CP increased and worsened. He states it feels like his MI pain. Repeat EKG done with no acute changes.   Date: 11/11/2011  Rate: 85  Rhythm: normal sinus rhythm  QRS Axis: normal  Intervals: Ventricular premature complex  ST/T Wave abnormalities: inferior infarct, age indeterminat  Conduction Disutrbances:none  Narrative Interpretation:   Old EKG Reviewed: unchanged from a couple hours ago.  Pt admitted to Dr. Ellin Goodie, Team 6,Tele, inpatient.    Dorthula Matas, PA 11/11/11 [phone]

## 2011-11-11 NOTE — ED Notes (Signed)
Called to check on status of bed, informed that the bed was still dirty. Will return call when ready.

## 2011-11-11 NOTE — ED Notes (Signed)
Family at bedside. 

## 2011-11-11 NOTE — ED Notes (Addendum)
Pt c/o new onset R side chest pain radiating to mid-sternum, increase pain w/deep breathing, pt describes the pain as a burning sensation, rates pain a 6/10, EDP Pickering aware of new complaints, new VO given and followed through

## 2011-11-11 NOTE — ED Notes (Signed)
Pt also c/o mid abd pain

## 2011-11-12 ENCOUNTER — Other Ambulatory Visit: Payer: Self-pay

## 2011-11-12 DIAGNOSIS — J189 Pneumonia, unspecified organism: Secondary | ICD-10-CM | POA: Diagnosis present

## 2011-11-12 HISTORY — DX: Pneumonia, unspecified organism: J18.9

## 2011-11-12 LAB — CARDIAC PANEL(CRET KIN+CKTOT+MB+TROPI)
CK, MB: 3.3 ng/mL (ref 0.3–4.0)
CK, MB: 3.3 ng/mL (ref 0.3–4.0)
Relative Index: INVALID (ref 0.0–2.5)
Relative Index: INVALID (ref 0.0–2.5)
Total CK: 75 U/L (ref 7–232)
Total CK: 77 U/L (ref 7–232)
Troponin I: <0.3 ng/mL (ref <0.30)
Troponin I: <0.3 ng/mL (ref <0.30)

## 2011-11-12 LAB — BASIC METABOLIC PANEL
BUN: 11 mg/dL (ref 6–23)
CO2: 28 mEq/L (ref 19–32)
Calcium: 9 mg/dL (ref 8.4–10.5)
Chloride: 104 mEq/L (ref 96–112)
Creatinine, Ser: 0.87 mg/dL (ref 0.50–1.35)
GFR calc Af Amer: >90 mL/min (ref >90)
GFR calc non Af Amer: 88 mL/min — ABNORMAL LOW (ref >90)
Glucose, Bld: 130 mg/dL — ABNORMAL HIGH (ref 70–99)
Potassium: 3.4 mEq/L — ABNORMAL LOW (ref 3.5–5.1)
Sodium: 138 mEq/L (ref 135–145)

## 2011-11-12 LAB — CBC
HCT: 39.9 % (ref 39.0–52.0)
Hemoglobin: 13.9 g/dL (ref 13.0–17.0)
MCH: 31.9 pg (ref 26.0–34.0)
MCHC: 34.8 g/dL (ref 30.0–36.0)
MCV: 91.5 fL (ref 78.0–100.0)
Platelets: 181 10*3/uL (ref 150–400)
RBC: 4.36 MIL/uL (ref 4.22–5.81)
RDW: 14.1 % (ref 11.5–15.5)
WBC: 7.1 10*3/uL (ref 4.0–10.5)

## 2011-11-12 LAB — GLUCOSE, CAPILLARY: Glucose-Capillary: 146 mg/dL — ABNORMAL HIGH (ref 70–99)

## 2011-11-12 MED ORDER — TRAMADOL HCL 50 MG PO TABS
50.0000 mg | ORAL_TABLET | Freq: Four times a day (QID) | ORAL | Status: DC | PRN
  Administered 2011-11-12: 50 mg via ORAL
  Filled 2011-11-12: qty 1

## 2011-11-12 MED ORDER — MOXIFLOXACIN HCL 400 MG PO TABS: 400.0000 mg | ORAL_TABLET | Freq: Every day | ORAL | Status: AC

## 2011-11-12 MED ORDER — MOXIFLOXACIN HCL IN NACL 400 MG/250ML IV SOLN
400.0000 mg | INTRAVENOUS | Status: DC
  Administered 2011-11-12: 400 mg via INTRAVENOUS
  Filled 2011-11-12: qty 250

## 2011-11-12 MED ORDER — POTASSIUM CHLORIDE CRYS ER 20 MEQ PO TBCR
20.0000 meq | EXTENDED_RELEASE_TABLET | Freq: Once | ORAL | Status: AC
  Administered 2011-11-12: 20 meq via ORAL

## 2011-11-12 NOTE — Progress Notes (Signed)
Pt was complaining of Chest pain radiating to the lt shoulder and shoulder blade, pain was worse with breathing, pt was offered nitro but refused, Claiborne Billings (PA) was notified for an alternative pain med., orders was given through epic and carried out. ----Jolene Guyett, D. rn

## 2011-11-12 NOTE — ED Provider Notes (Signed)
Medical screening examination/treatment/procedure(s) were performed by non-physician practitioner and as supervising physician I was immediately available for consultation/collaboration.  Juliet Rude. Rubin Payor, MD 11/12/11 [phone]

## 2011-11-12 NOTE — Progress Notes (Signed)
[facility] hospitalist progress note for Dr. Elisabeth Pigeon. Chief complaint. Chest pain. History of present illness. 66 year old male in hospital with complaints of chest pain. He has a cardiac enzymes cycled x3 now and these have all been negative. He describes all left sternal 2 axillary area pain that is not constant but instead hurts with deep inspiration. He denies nausea, diaphoresis, dyspnea. He states this is a similar pain he presented with on admission. A 12-lead EKG was obtained and does not look any different from his admitting EKG. Physical exam vital signs temperature 97.5, pulse 74, respiration 20, blood pressure 128/71. General appearance. Well-developed male in no acute distress. Cardiac. Rate and rhythm regular with occasional irregular beat. Lungs. Diminished with some scattered rhonchi but otherwise clear. Abdomen. Soft with positive bowel sounds. No pain. Impression/plan. Problem #1. Chest pain. This does not seem cardiac patient description. He did obtain a repeat 12-lead EKG and this does not appear changed from his ER EKG. I have given him a dose of Ultram for pain. I will go ahead and cycle 1 further set of cardiac enzymes at . 7:00 AM.

## 2011-11-12 NOTE — Discharge Summary (Addendum)
Patient ID: William Lee MRN: [id number] DOB/AGE: 67-Oct-1947 67 y.o.  Admit date: 11/11/2011 Discharge date: 11/12/2011  Primary Care Physician:  Kaleen Mask, MD  Discharge Diagnoses:    Present on Admission:  .COPD, severe .Chest pain .Shortness of breath .Community acquired pneumonia  Principal Problem:  *Chest pain Active Problems:  Community acquired pneumonia  COPD, severe  Shortness of breath   Current Discharge Medication List    START taking these medications   Details  moxifloxacin (AVELOX) 400 MG tablet Take 1 tablet (400 mg total) by mouth daily. Qty: 10 tablet, Refills: 0      CONTINUE these medications which have NOT CHANGED   Details  albuterol (PROVENTIL) (5 MG/ML) 0.5% nebulizer solution Take 2.5 mg by nebulization every 6 (six) hours as needed.      aspirin 325 MG tablet Take 325 mg by mouth daily.      atorvastatin (LIPITOR) 20 MG tablet Take 20 mg by mouth daily.    budesonide-formoterol (SYMBICORT) 160-4.5 MCG/ACT inhaler Inhale 2 puffs into the lungs 2 (two) times daily.    diltiazem (CARDIZEM CD) 120 MG 24 hr capsule Take 120 mg by mouth at bedtime.    nitroGLYCERIN (NITROSTAT) 0.4 MG SL tablet Place 0.4 mg under the tongue every 5 (five) minutes as needed.      omeprazole (PRILOSEC) 20 MG capsule Take 20 mg by mouth 2 (two) times daily. Take one capsule in the morning and 2 capsules in the evening    potassium chloride (KLOR-CON) 20 MEQ packet Take 20 mEq by mouth daily.    Tiotropium Bromide Monohydrate (SPIRIVA HANDIHALER IN) Inhale 1 capsule into the lungs daily.     triamterene-hydrochlorothiazide (MAXZIDE-25) 37.5-25 MG per tablet Take 1 tablet by mouth daily.          Disposition and Follow-up:  with PCP in 1 week; patient reported he will schedule an appointment for pulmonary to follow up on CXR findings; CXR needs to be repeated upon resolution of symptoms and patietn needs to have repeat CT scan to evaluate the nodules  seen on CT scan in 2007.  Consults:  none  Significant Diagnostic Studies:  Dg Chest 2 View  11/11/2011  *RADIOLOGY REPORT*  Clinical Data: Left-sided chest pain and shortness of breath.  CHEST - 2 VIEW  Comparison: Chest radiograph performed 08/22/2008  Findings: The lungs are well-aerated.  Left peripheral midlung airspace opacity could reflect pneumonia, though an underlying mass cannot be entirely excluded.  There is no evidence of pleural effusion or pneumothorax.  Scattered biapical nodules are better characterized on prior CT.  The heart is normal in size; the patient is status post median sternotomy.  No acute osseous abnormalities are seen.  IMPRESSION:  1.  Left peripheral midlung airspace opacity could reflect pneumonia, though underlying mass cannot be entirely excluded. Suggest treatment for pneumonia and follow-up chest radiograph to ensure resolution. 2.  Scattered biapical pulmonary nodules are better characterized on prior CT.  Original Report Authenticated By: Tonia Ghent, M.D.    Brief H and P: 66 year old male with history of HTN, Posterior wall MI status post angioplasty presented to ED with sudden onset chest pain, while at rest, 8/10 in intensity, radiating to left arm and somewhat to right side of the chest, worse with movement. Patient reports no associated numbness or tingling sensation in left arm, no palpitations, no cough, no lightheadedness or dizziness. Patient has experienced somewhat similar symptoms in past. The pain has been constant with no alleviating  factors. No complaints of abdominal pain, no nausea or vomiting, no blood in stool or urine. Patient reports no fever or chills and no sick contacts at home. Patient is admitted to hospitalist service for further evaluation and management.    Physical Exam on Discharge:  Filed Vitals:   11/11/11 2141 11/12/11 0152 11/12/11 0522 11/12/11 0849  BP: 136/74 128/71 131/75   Pulse: 77 74 74   Temp: 97.3 F (36.3 C)  97.5 F (36.4 C) 97.4 F (36.3 C)   TempSrc: Oral Axillary Oral   Resp: 18 20 18    Height:      Weight:   56.881 kg (125 lb 6.4 oz)   SpO2: 100% 96% 96% 96%     Intake/Output Summary (Last 24 hours) at 11/12/11 0856 Last data filed at 11/12/11 0620  Gross per 24 hour  Intake    600 ml  Output   2850 ml  Net  -2250 ml    General: Alert, awake, oriented x3, in no acute distress. HEENT: No bruits, no goiter. Heart: Regular rate and rhythm, without murmurs, rubs, gallops. Lungs: Clear to auscultation bilaterally. Abdomen: Soft, nontender, nondistended, positive bowel sounds. Extremities: No clubbing cyanosis or edema with positive pedal pulses. Neuro: Grossly intact, nonfocal.  CBC:    Component Value Date/Time   WBC 7.1 11/12/2011 0630   HGB 13.9 11/12/2011 0630   HCT 39.9 11/12/2011 0630   PLT 181 11/12/2011 0630   MCV 91.5 11/12/2011 0630   NEUTROABS 5.0 11/11/2011 1533   LYMPHSABS 0.6* 11/11/2011 1533   MONOABS 0.6 11/11/2011 1533   EOSABS 0.3 11/11/2011 1533   BASOSABS 0.0 11/11/2011 1533    Basic Metabolic Panel:    Component Value Date/Time   NA 138 11/12/2011 0630   K 3.4* 11/12/2011 0630   CL 104 11/12/2011 0630   CO2 28 11/12/2011 0630   BUN 11 11/12/2011 0630   CREATININE 0.87 11/12/2011 0630   GLUCOSE 130* 11/12/2011 0630   CALCIUM 9.0 11/12/2011 0630    Hospital Course:   Principal Problem:   *Chest pain - perhaps musculoskeletal versus pneumonia - ACS ruled out; cardiac enzymes x 3 negative - patient refused 2 D ECHO in hospital and insisted it would be schedule on an outpatient basis  Active Problems:   Community acquired pneumonia - patient started on antibiotic in ED  - can be discharged home with Avelox for 10 days   COPD, severe - continue home medication regimen   Shortness of breath - secondary to COPD versus pneumonia - no longer short of breath  Disposition - medically stable to be discharged home  * patient had some pulmonary opacities  identified on CXR which as per radialogy should be followed up with another CXR after resolution of symptoms of pneumonia to make sure the opacity does not represent a mass / or a malignant process. Also, I spoke with Pulmonary MD regarding concerning findings on previous CT scan chest in 2007 and recommendation is to repeat CT scan chest on an outpatient basis upon resolution of pneumonia to re-evaluate the nodules seen on previous CT chest.  Time spent on Discharge: Greater than 30 minutes  Signed: Nil Bolser 11/12/2011, 8:56 AM

## 2011-11-13 NOTE — Progress Notes (Signed)
Utilization Review Completed.William Lee T1/14/2013   

## 2011-11-17 ENCOUNTER — Other Ambulatory Visit: Payer: Self-pay | Admitting: Family Medicine

## 2011-11-17 DIAGNOSIS — R911 Solitary pulmonary nodule: Secondary | ICD-10-CM

## 2011-11-20 NOTE — Progress Notes (Signed)
PT seen and examined at bed side. Agree with above's assessment and plan  Jamariya Davidoff 

## 2011-11-24 ENCOUNTER — Ambulatory Visit
Admission: RE | Admit: 2011-11-24 | Discharge: 2011-11-24 | Disposition: A | Discharge status: Home / Self Care | Payer: Medicare Other | Source: Ambulatory Visit | Attending: Family Medicine | Admitting: Family Medicine

## 2011-11-24 DIAGNOSIS — R911 Solitary pulmonary nodule: Secondary | ICD-10-CM

## 2011-11-24 MED ORDER — IOHEXOL 300 MG/ML  SOLN
75.0000 mL | Freq: Once | INTRAMUSCULAR | Status: AC | PRN
  Administered 2011-11-24: 75 mL via INTRAVENOUS

## 2011-12-05 ENCOUNTER — Ambulatory Visit (HOSPITAL_COMMUNITY)
Admission: RE | Admit: 2011-12-05 | Discharge: 2011-12-05 | Disposition: A | Discharge status: Home / Self Care | Payer: Medicare Other | Source: Ambulatory Visit | Attending: Family Medicine | Admitting: Family Medicine

## 2011-12-05 DIAGNOSIS — R079 Chest pain, unspecified: Secondary | ICD-10-CM | POA: Insufficient documentation

## 2011-12-05 DIAGNOSIS — I059 Rheumatic mitral valve disease, unspecified: Secondary | ICD-10-CM

## 2011-12-05 DIAGNOSIS — I2510 Atherosclerotic heart disease of native coronary artery without angina pectoris: Secondary | ICD-10-CM

## 2011-12-05 NOTE — Progress Notes (Signed)
  Echocardiogram 2D Echocardiogram has been performed.  William Lee 12/05/2011, 2:48 PM

## 2012-01-11 ENCOUNTER — Other Ambulatory Visit: Payer: Self-pay | Admitting: Family Medicine

## 2012-01-11 DIAGNOSIS — R911 Solitary pulmonary nodule: Secondary | ICD-10-CM

## 2012-01-15 ENCOUNTER — Ambulatory Visit
Admission: RE | Admit: 2012-01-15 | Discharge: 2012-01-15 | Disposition: A | Discharge status: Home / Self Care | Payer: Medicare Other | Source: Ambulatory Visit | Attending: Family Medicine | Admitting: Family Medicine

## 2012-01-15 DIAGNOSIS — R911 Solitary pulmonary nodule: Secondary | ICD-10-CM

## 2012-01-15 MED ORDER — IOHEXOL 300 MG/ML  SOLN
75.0000 mL | Freq: Once | INTRAMUSCULAR | Status: AC | PRN
  Administered 2012-01-15: 75 mL via INTRAVENOUS

## 2012-02-01 DIAGNOSIS — K4090 Unilateral inguinal hernia, without obstruction or gangrene, not specified as recurrent: Secondary | ICD-10-CM | POA: Insufficient documentation

## 2012-02-01 HISTORY — DX: Unilateral inguinal hernia, without obstruction or gangrene, not specified as recurrent: K40.90

## 2012-02-12 ENCOUNTER — Encounter: Payer: Self-pay | Admitting: Cardiology

## 2012-02-12 ENCOUNTER — Ambulatory Visit (INDEPENDENT_AMBULATORY_CARE_PROVIDER_SITE_OTHER): Payer: Medicare Other | Admitting: Cardiology

## 2012-02-12 VITALS — BP 124/70 | HR 78 | Ht 63.0 in | Wt 132.0 lb

## 2012-02-12 DIAGNOSIS — E785 Hyperlipidemia, unspecified: Secondary | ICD-10-CM

## 2012-02-12 DIAGNOSIS — R0989 Other specified symptoms and signs involving the circulatory and respiratory systems: Secondary | ICD-10-CM

## 2012-02-12 DIAGNOSIS — R0609 Other forms of dyspnea: Secondary | ICD-10-CM

## 2012-02-12 DIAGNOSIS — I2510 Atherosclerotic heart disease of native coronary artery without angina pectoris: Secondary | ICD-10-CM

## 2012-02-12 DIAGNOSIS — R0602 Shortness of breath: Secondary | ICD-10-CM

## 2012-02-12 DIAGNOSIS — R0600 Dyspnea, unspecified: Secondary | ICD-10-CM

## 2012-02-12 LAB — BASIC METABOLIC PANEL
BUN: 13 mg/dL (ref 6–23)
CO2: 29 mEq/L (ref 19–32)
Calcium: 9.3 mg/dL (ref 8.4–10.5)
Chloride: 102 mEq/L (ref 96–112)
Creatinine, Ser: 1 mg/dL (ref 0.4–1.5)
GFR: 77.44 mL/min (ref >60.00)
Glucose, Bld: 103 mg/dL — ABNORMAL HIGH (ref 70–99)
Potassium: 3.8 mEq/L (ref 3.5–5.1)
Sodium: 139 mEq/L (ref 135–145)

## 2012-02-12 LAB — HEPATIC FUNCTION PANEL
ALT: 18 U/L (ref 0–53)
AST: 23 U/L (ref 0–37)
Albumin: 4.3 g/dL (ref 3.5–5.2)
Alkaline Phosphatase: 58 U/L (ref 39–117)
Bilirubin, Direct: 0.2 mg/dL (ref 0.0–0.3)
Total Bilirubin: 2 mg/dL — ABNORMAL HIGH (ref 0.3–1.2)
Total Protein: 7.1 g/dL (ref 6.0–8.3)

## 2012-02-12 LAB — LIPID PANEL
Cholesterol: 182 mg/dL (ref 0–200)
HDL: 59.8 mg/dL (ref >39.00)
LDL Cholesterol: 100 mg/dL — ABNORMAL HIGH (ref 0–99)
Total CHOL/HDL Ratio: 3
Triglycerides: 113 mg/dL (ref 0.0–149.0)
VLDL: 22.6 mg/dL (ref 0.0–40.0)

## 2012-02-12 NOTE — Progress Notes (Signed)
Quick Note:  Please report to patient. The recent labs are stable. Continue same medication and careful diet. ______ 

## 2012-02-12 NOTE — Progress Notes (Signed)
William Lee Date of Birth:  06-11-1946 [facility] [address] [place], [place]  [address] [phone]  Fax   [phone]  HPI: This pleasant 66 year old gentleman is seen for a scheduled 6 month followup office visit.  The patient has a history of known ischemic heart disease.  He had an acute inferior wall myocardial infarction in August 1996.  He underwent subsequent coronary artery bypass graft surgery.  Patient has a history of severe COPD.  Patient has known left ventricular dysfunction with previous ejection fraction by echocardiogram showing an ejection fraction of 30-35% with significant inferoposterior wall wall akinesis.  The patient also has known mild to moderate mitral regurgitation by echo.  The patient has a history of hyperlipidemia and is on Lipitor.  Current Outpatient Prescriptions  Medication Sig Dispense Refill  . albuterol (PROVENTIL) (5 MG/ML) 0.5% nebulizer solution Take 2.5 mg by nebulization every 6 (six) hours as needed.        Marland Kitchen aspirin 325 MG tablet Take 325 mg by mouth daily.        Marland Kitchen atorvastatin (LIPITOR) 20 MG tablet Take 20 mg by mouth daily.      . budesonide-formoterol (SYMBICORT) 160-4.5 MCG/ACT inhaler Inhale 2 puffs into the lungs 2 (two) times daily.      Marland Kitchen diltiazem (CARDIZEM CD) 120 MG 24 hr capsule Take 120 mg by mouth at bedtime.      . nitroGLYCERIN (NITROSTAT) 0.4 MG SL tablet Place 0.4 mg under the tongue every 5 (five) minutes as needed.        Marland Kitchen omeprazole (PRILOSEC) 20 MG capsule Take 20 mg by mouth 2 (two) times daily. Take one capsule in the morning and 2 capsules in the evening      . potassium chloride (KLOR-CON) 20 MEQ packet Take 20 mEq by mouth daily.      . Tiotropium Bromide Monohydrate (SPIRIVA HANDIHALER IN) Inhale 1 capsule into the lungs daily.       Marland Kitchen triamterene-hydrochlorothiazide (MAXZIDE-25) 37.5-25 MG per tablet Take 1 tablet by mouth daily.        . cyclobenzaprine (FLEXERIL) 10 MG tablet as  needed.        No Known Allergies  Patient Active Problem List  Diagnoses  . Ischemic heart disease  . MI (myocardial infarction)  . COPD, severe  . Papilloma of larynx  . Chest pain  . Dyslipidemia  . Shortness of breath  . Community acquired pneumonia    History  Smoking status  . Former Smoker  . Quit date: 10/30/1990  Smokeless tobacco  . Not on file    History  Alcohol Use No    No family history on file.  Review of Systems: The patient denies any heat or cold intolerance.  No weight gain or weight loss.  The patient denies headaches or blurry vision.  There is no cough or sputum production.  The patient denies dizziness.  There is no hematuria or hematochezia.  The patient denies any muscle aches or arthritis.  The patient denies any rash.  The patient denies frequent falling or instability.  There is no history of depression or anxiety.  All other systems were reviewed and are negative.   Physical Exam: Filed Vitals:   02/12/12 1025  BP: 124/70  Pulse: 78   the general appearance reveals a thin middle-aged gentleman in no distress.Pupils equal and reactive.   Extraocular Movements are full.  There is no scleral icterus.  The mouth and pharynx are  normal.  The neck is supple.  The carotids reveal no bruits.  The jugular venous pressure is normal.  The thyroid is not enlarged.  There is no lymphadenopathy.  Chest reveals distant breath sounds bilaterally.The precordium is quiet.  The first heart sound is normal.  The second heart sound is physiologically split.  There is no murmur gallop rub or click.  There is no abnormal lift or heave.  The abdomen is soft and nontender. Bowel sounds are normal. The liver and spleen are not enlarged. There Are no abdominal masses. There are no bruits.  The pedal pulses are good.  There is no phlebitis or edema.  There is no cyanosis or clubbing. Strength is normal and symmetrical in all extremities.  There is no lateralizing  weakness.  There are no sensory deficits.  The skin is warm and dry.  There is no rash.     Assessment / Plan: The patient is to continue same medication.  We are checking blood work today.  Recheck in 6 months for followup office visit and fasting lab work

## 2012-02-12 NOTE — Patient Instructions (Signed)
Fasting Labs today:  Lipid/Liver and BMET.  Your physician wants you to follow-up in: 6 months with Dr. Patty Sermons.  You will receive a reminder letter in the mail two months in advance. If you don't receive a letter, please call our office to schedule the follow-up appointment.

## 2012-02-12 NOTE — Assessment & Plan Note (Signed)
The patient has chronic shortness of breath related to his severe COPD which she has had since 1962.  Has been no significant change in his exertional dyspnea since last visit.  Of note is the fact that he went to the emergency room in January 2013 with atypical chest pain and was found to have pneumonia.  He had a CAT scan at that time and a followup CAT scan more recently which showed no worrisome lesions.

## 2012-02-12 NOTE — Assessment & Plan Note (Signed)
The patient has a history of dyslipidemia.  He is tolerating Lipitor.  He is not having any myalgias or other side effects.  We are rechecking his labs today

## 2012-02-15 ENCOUNTER — Telehealth: Payer: Self-pay | Admitting: *Deleted

## 2012-02-15 NOTE — Telephone Encounter (Signed)
Advised of labs 

## 2012-02-15 NOTE — Telephone Encounter (Signed)
Message copied by Burnell Blanks on Thu Feb 15, 2012  5:33 PM ------      Message from: Cassell Clement      Created: Mon Feb 12, 2012  8:27 PM       Please report to patient.  The recent labs are stable. Continue same medication and careful diet.

## 2012-03-18 ENCOUNTER — Other Ambulatory Visit: Payer: Self-pay | Admitting: Cardiology

## 2012-03-18 NOTE — Telephone Encounter (Signed)
Refilled atorvastatin

## 2012-04-02 ENCOUNTER — Ambulatory Visit (INDEPENDENT_AMBULATORY_CARE_PROVIDER_SITE_OTHER): Payer: Medicare Other | Admitting: Surgery

## 2012-04-02 ENCOUNTER — Encounter (INDEPENDENT_AMBULATORY_CARE_PROVIDER_SITE_OTHER): Payer: Self-pay | Admitting: Surgery

## 2012-04-02 VITALS — BP 122/88 | HR 86 | Temp 98.6°F | Resp 14 | Ht 62.0 in | Wt 131.0 lb

## 2012-04-02 DIAGNOSIS — K4090 Unilateral inguinal hernia, without obstruction or gangrene, not specified as recurrent: Secondary | ICD-10-CM

## 2012-04-02 NOTE — Patient Instructions (Signed)
We will schedule surgery for inguinal hernia repair

## 2012-04-02 NOTE — Progress Notes (Signed)
NAME: William Lee DOB: 02/28/1946 MRN: [id number]                                                                                      DATE: 04/02/2012  PCP: WilliamWILSON OLIVER, MD, MD Referring Provider: Elkins, Wilson Lee, *  IMPRESSION:  Reducible Left Inguinal hernia COPD, stable  PLAN:   Repair with mesh, open, at his convenience. I have discussed with the patient the fact that he has an inguinal hernia and reviewed the pathophysiology of that diagnosis. I have given him educational materials about this. I discussed options for treatment including observation or repair and have discussed both laparoscopic and open inguinal hernia repairs as potential techniques. We have discussed the use of mesh. We have discussed risks of surgery including bleeding, infection, recurrence, postoperative pain and possible chronic groin pain, testicular injury, and potential issues with voiding. I believe the patient's questions have been answered and that he has a good understanding of the issues                  CC:  Chief Complaint  Patient presents with  . Hernia    New Pt. Eval LIH    HPI:  William Lee is a 66 y.o.  male who presents for evaluation of a left inguinal hernia. He was lifting some heavy materials from his trunk about two months ago and felt a bulge in the left inguinal area. He also now has a little bit of discomfort in the left lower quadrant. The bulge seems to be improved but if he does a lot of activities or walks a lot he does notice discomfort in the groin. It's not gotten any worse. He started noticing symptoms on the right side. Interestingly, he used to have fairly hard firm stools and since this occurred they have been more soft. He has not had diarrhea or other abdominal symptoms however. He's never had a hernia before.  PMH:  has a past medical history of Ischemic heart disease; MI (myocardial infarction); COPD, severe; Papilloma of larynx; Chest pain; SOB (shortness  of breath) on exertion; Dyslipidemia; COPD (chronic obstructive pulmonary disease); Hyperlipidemia; Hypertension; GERD (gastroesophageal reflux disease); and Asthma.  PSH:   has past surgical history that includes Coronary artery bypass graft; Cholecystectomy (12/1995); and papalomas virus removal (3/10 7/10 5/11 10/12).  ALLERGIES:  No Known Allergies  MEDICATIONS: Current outpatient prescriptions:albuterol (PROVENTIL) (5 MG/ML) 0.5% nebulizer solution, Take 2.5 mg by nebulization every 6 (six) hours as needed.  , Disp: , Rfl: ;  aspirin 325 MG tablet, Take 325 mg by mouth daily.  , Disp: , Rfl: ;  atorvastatin (LIPITOR) 20 MG tablet, TAKE 1 TABLET EVERY DAY, Disp: 30 tablet, Rfl: 12;  budesonide-formoterol (SYMBICORT) 160-4.5 MCG/ACT inhaler, Inhale 2 puffs into the lungs 2 (two) times daily., Disp: , Rfl:  cyclobenzaprine (FLEXERIL) 10 MG tablet, as needed., Disp: , Rfl: ;  diltiazem (CARDIZEM CD) 120 MG 24 hr capsule, Take 120 mg by mouth at bedtime., Disp: , Rfl: ;  omeprazole (PRILOSEC) 20 MG capsule, Take 20 mg by mouth 2 (two) times daily. Take one capsule in the morning and   2 capsules in the evening, Disp: , Rfl: ;  potassium chloride (KLOR-CON) 20 MEQ packet, Take 20 mEq by mouth daily., Disp: , Rfl:  Tiotropium Bromide Monohydrate (SPIRIVA HANDIHALER IN), Inhale 1 capsule into the lungs daily. , Disp: , Rfl: ;  triamterene-hydrochlorothiazide (MAXZIDE-25) 37.5-25 MG per tablet, Take 1 tablet by mouth daily.  , Disp: , Rfl: ;  DISCONTD: atorvastatin (LIPITOR) 20 MG tablet, Take 20 mg by mouth daily., Disp: , Rfl:   ROS: He has filled out our 12 point review of systems and it is negative except for easy bruising. He has severe copd, but has not smoked since 1992 and walks about 3 miles on a treadmill. No need for supplemental O2.   EXAM:   VITAL SIGNS: BP 122/88  Pulse 86  Temp(Src) 98.6 F (37 C) (Temporal)  Resp 14  Ht 5' 2" (1.575 m)  Wt 131 lb (59.421 kg)  BMI 23.96  kg/m2  GENERAL:  The patient is alert, oriented, and generally healthy-appearing, NAD. Mood and affect are normal.  HEENT:  The head is normocephalic, the eyes nonicteric, the pupils were round regular and equal. EOMs are normal. Pharynx normal. Dentition good.  NECK:  The neck is supple and there are no masses or thyromegaly.  LUNGS:  Normal respirations and clear to auscultation.Somewhat distant breath sounds.  HEART:  Regular rhythm, with no murmurs rubs or gallops. Pulses are intact carotid dorsalis pedis and posterior tibial. No significant varicosities are noted.  ABDOMEN:  Soft, flat, and nontender. No masses or organomegaly is noted. No hernias are noted. Bowel sounds are normal.  GU:  Normal male. He has a left inguinal hernia presenting at the superficial ring and trying to extend towards the scrotum.  EXTREMITIES:  Good range of motion, no edema.   DATA REVIEWED:  Notes in Epic    William Lee 04/02/2012  CC: William Lee, *, WilliamWILSON OLIVER, MD, MD Lee, William        

## 2012-04-26 ENCOUNTER — Encounter (HOSPITAL_BASED_OUTPATIENT_CLINIC_OR_DEPARTMENT_OTHER): Payer: Self-pay | Admitting: *Deleted

## 2012-04-29 ENCOUNTER — Telehealth (INDEPENDENT_AMBULATORY_CARE_PROVIDER_SITE_OTHER): Payer: Self-pay | Admitting: General Surgery

## 2012-04-29 ENCOUNTER — Encounter (HOSPITAL_BASED_OUTPATIENT_CLINIC_OR_DEPARTMENT_OTHER)
Admission: RE | Admit: 2012-04-29 | Discharge: 2012-04-29 | Disposition: A | Discharge status: Home / Self Care | Payer: Medicare Other | Source: Ambulatory Visit | Attending: Surgery | Admitting: Surgery

## 2012-04-29 LAB — BASIC METABOLIC PANEL
BUN: 11 mg/dL (ref 6–23)
CO2: 30 mEq/L (ref 19–32)
Calcium: 9.5 mg/dL (ref 8.4–10.5)
Chloride: 99 mEq/L (ref 96–112)
Creatinine, Ser: 1.14 mg/dL (ref 0.50–1.35)
GFR calc Af Amer: 75 mL/min — ABNORMAL LOW (ref >90)
GFR calc non Af Amer: 65 mL/min — ABNORMAL LOW (ref >90)
Glucose, Bld: 113 mg/dL — ABNORMAL HIGH (ref 70–99)
Potassium: 3.1 mEq/L — ABNORMAL LOW (ref 3.5–5.1)
Sodium: 139 mEq/L (ref 135–145)

## 2012-04-29 NOTE — Telephone Encounter (Signed)
Patient aware to take an extra potassium tablet today and tomorrow. He will do this and follow up for surgery.

## 2012-04-29 NOTE — Telephone Encounter (Signed)
Message copied by Liliana Cline on Mon Apr 29, 2012  1:18 PM ------      Message from: Currie Paris      Created: Mon Apr 29, 2012  1:15 PM       This patient has an abnormal lab and needs some extra KCl See if he can take some extra today and tomorrow

## 2012-05-01 ENCOUNTER — Ambulatory Visit (HOSPITAL_BASED_OUTPATIENT_CLINIC_OR_DEPARTMENT_OTHER)
Admission: RE | Admit: 2012-05-01 | Discharge: 2012-05-01 | Disposition: A | Discharge status: Home / Self Care | Payer: Medicare Other | Source: Ambulatory Visit | Attending: Surgery | Admitting: Surgery

## 2012-05-01 ENCOUNTER — Encounter (HOSPITAL_BASED_OUTPATIENT_CLINIC_OR_DEPARTMENT_OTHER): Payer: Self-pay | Admitting: *Deleted

## 2012-05-01 ENCOUNTER — Ambulatory Visit (HOSPITAL_BASED_OUTPATIENT_CLINIC_OR_DEPARTMENT_OTHER): Payer: Medicare Other | Admitting: *Deleted

## 2012-05-01 ENCOUNTER — Encounter (HOSPITAL_BASED_OUTPATIENT_CLINIC_OR_DEPARTMENT_OTHER)
Admission: RE | Disposition: A | Discharge status: Home / Self Care | Payer: Self-pay | Source: Ambulatory Visit | Attending: Surgery

## 2012-05-01 DIAGNOSIS — K4090 Unilateral inguinal hernia, without obstruction or gangrene, not specified as recurrent: Secondary | ICD-10-CM

## 2012-05-01 DIAGNOSIS — Z01812 Encounter for preprocedural laboratory examination: Secondary | ICD-10-CM | POA: Insufficient documentation

## 2012-05-01 HISTORY — DX: Other specified postprocedural states: Z98.890

## 2012-05-01 HISTORY — DX: Other specified postprocedural states: R11.2

## 2012-05-01 HISTORY — PX: INGUINAL HERNIA REPAIR: SHX194

## 2012-05-01 LAB — POCT HEMOGLOBIN-HEMACUE: Hemoglobin: 14 g/dL (ref 13.0–17.0)

## 2012-05-01 SURGERY — REPAIR, HERNIA, INGUINAL, ADULT
Anesthesia: Spinal | Site: Abdomen | Laterality: Left | Wound class: Clean

## 2012-05-01 MED ORDER — ONDANSETRON HCL 4 MG/2ML IJ SOLN
INTRAMUSCULAR | Status: DC | PRN
  Administered 2012-05-01: 4 mg via INTRAVENOUS

## 2012-05-01 MED ORDER — MIDAZOLAM HCL 5 MG/5ML IJ SOLN
INTRAMUSCULAR | Status: DC | PRN
  Administered 2012-05-01: 1 mg via INTRAVENOUS
  Administered 2012-05-01 (×2): 0.5 mg via INTRAVENOUS

## 2012-05-01 MED ORDER — FENTANYL CITRATE 0.05 MG/ML IJ SOLN
INTRAMUSCULAR | Status: DC | PRN
  Administered 2012-05-01: 25 ug via INTRAVENOUS
  Administered 2012-05-01: 50 ug via INTRAVENOUS

## 2012-05-01 MED ORDER — OXYCODONE-ACETAMINOPHEN 5-325 MG PO TABS: 1.0000 | ORAL_TABLET | ORAL | Status: AC | PRN

## 2012-05-01 MED ORDER — BUPIVACAINE HCL (PF) 0.25 % IJ SOLN
INTRAMUSCULAR | Status: DC | PRN
  Administered 2012-05-01: 16 mL

## 2012-05-01 MED ORDER — CEPHALEXIN 500 MG PO CAPS: 500.0000 mg | ORAL_CAPSULE | Freq: Three times a day (TID) | ORAL | Status: AC

## 2012-05-01 MED ORDER — ACETAMINOPHEN 10 MG/ML IV SOLN: 1000.0000 mg | Freq: Once | INTRAVENOUS | Status: DC | PRN

## 2012-05-01 MED ORDER — OXYCODONE-ACETAMINOPHEN 5-325 MG PO TABS
1.0000 | ORAL_TABLET | Freq: Once | ORAL | Status: AC | PRN
  Administered 2012-05-01: 1 via ORAL

## 2012-05-01 MED ORDER — ONDANSETRON HCL 4 MG/2ML IJ SOLN: 4.0000 mg | Freq: Once | INTRAMUSCULAR | Status: DC | PRN

## 2012-05-01 MED ORDER — CHLORHEXIDINE GLUCONATE 4 % EX LIQD: 1.0000 "application " | Freq: Once | CUTANEOUS | Status: DC

## 2012-05-01 MED ORDER — HYDROMORPHONE HCL PF 1 MG/ML IJ SOLN: 0.2500 mg | INTRAMUSCULAR | Status: DC | PRN

## 2012-05-01 MED ORDER — HYDROMORPHONE HCL PF 1 MG/ML IJ SOLN
0.2500 mg | INTRAMUSCULAR | Status: DC | PRN
  Administered 2012-05-01 (×2): 0.5 mg via INTRAVENOUS

## 2012-05-01 MED ORDER — LACTATED RINGERS IV SOLN
INTRAVENOUS | Status: DC
  Administered 2012-05-01 (×2): via INTRAVENOUS

## 2012-05-01 MED ORDER — CEFAZOLIN SODIUM 1-5 GM-% IV SOLN
1.0000 g | INTRAVENOUS | Status: AC
  Administered 2012-05-01: 2 g via INTRAVENOUS

## 2012-05-01 SURGICAL SUPPLY — 42 items
BLADE HEX COATED 2.75 (ELECTRODE) ×2 IMPLANT
BLADE SURG 10 STRL SS (BLADE) ×2 IMPLANT
BLADE SURG ROTATE 9660 (MISCELLANEOUS) ×2 IMPLANT
CHLORAPREP W/TINT 26ML (MISCELLANEOUS) ×2 IMPLANT
CLIP TI WIDE RED SMALL 6 (CLIP) IMPLANT
CLOTH BEACON ORANGE TIMEOUT ST (SAFETY) ×2 IMPLANT
COVER MAYO STAND STRL (DRAPES) ×2 IMPLANT
COVER TABLE BACK 60X90 (DRAPES) ×2 IMPLANT
DECANTER SPIKE VIAL GLASS SM (MISCELLANEOUS) IMPLANT
DERMABOND ADVANCED (GAUZE/BANDAGES/DRESSINGS) ×1
DERMABOND ADVANCED .7 DNX12 (GAUZE/BANDAGES/DRESSINGS) ×1 IMPLANT
DRAIN PENROSE 1/2X12 LTX STRL (WOUND CARE) ×2 IMPLANT
DRAPE LAPAROTOMY TRNSV 102X78 (DRAPE) ×2 IMPLANT
DRAPE UTILITY XL STRL (DRAPES) ×2 IMPLANT
ELECT REM PT RETURN 9FT ADLT (ELECTROSURGICAL) ×2
ELECTRODE REM PT RTRN 9FT ADLT (ELECTROSURGICAL) ×1 IMPLANT
GLOVE BIO SURGEON STRL SZ 6.5 (GLOVE) ×4 IMPLANT
GLOVE BIO SURGEON STRL SZ7 (GLOVE) ×4 IMPLANT
GLOVE BIOGEL PI IND STRL 7.5 (GLOVE) ×1 IMPLANT
GLOVE BIOGEL PI INDICATOR 7.5 (GLOVE) ×1
GLOVE EUDERMIC 7 POWDERFREE (GLOVE) ×2 IMPLANT
GOWN PREVENTION PLUS XLARGE (GOWN DISPOSABLE) ×8 IMPLANT
MESH HERNIA 3X6 (Mesh General) ×2 IMPLANT
NEEDLE HYPO 22GX1.5 SAFETY (NEEDLE) ×2 IMPLANT
NEEDLE HYPO 25X1 1.5 SAFETY (NEEDLE) ×2 IMPLANT
NS IRRIG 1000ML POUR BTL (IV SOLUTION) IMPLANT
PACK BASIN DAY SURGERY FS (CUSTOM PROCEDURE TRAY) ×2 IMPLANT
PENCIL BUTTON HOLSTER BLD 10FT (ELECTRODE) ×2 IMPLANT
SLEEVE SCD COMPRESS KNEE MED (MISCELLANEOUS) IMPLANT
SPONGE INTESTINAL PEANUT (DISPOSABLE) ×2 IMPLANT
SPONGE LAP 4X18 X RAY DECT (DISPOSABLE) ×2 IMPLANT
SUT MNCRL AB 4-0 PS2 18 (SUTURE) ×2 IMPLANT
SUT PROLENE 2 0 CT2 30 (SUTURE) ×4 IMPLANT
SUT SILK 2 0 SH (SUTURE) ×2 IMPLANT
SUT VIC AB 3-0 CT1 27 (SUTURE) ×2
SUT VIC AB 3-0 CT1 27XBRD (SUTURE) ×2 IMPLANT
SUT VIC AB 4-0 BRD 54 (SUTURE) ×2 IMPLANT
SYR CONTROL 10ML LL (SYRINGE) ×2 IMPLANT
TAPE HYPAFIX 4 X10 (GAUZE/BANDAGES/DRESSINGS) IMPLANT
TOWEL OR 17X24 6PK STRL BLUE (TOWEL DISPOSABLE) ×2 IMPLANT
TOWEL OR NON WOVEN STRL DISP B (DISPOSABLE) ×2 IMPLANT
WATER STERILE IRR 1000ML POUR (IV SOLUTION) ×2 IMPLANT

## 2012-05-01 NOTE — Interval H&P Note (Signed)
History and Physical Interval Note:  05/01/2012 10:20 AM  William Lee  has presented today for surgery, with the diagnosis of left inguninal hernia  The various methods of treatment have been discussed with the patient and family. After consideration of risks, benefits and other options for treatment, the patient has consented to  Procedure(s) (LRB): HERNIA REPAIR INGUINAL ADULT (Left) as a surgical intervention .  The patient's history has been reviewed, patient examined, no change in status, stable for surgery.  I have reviewed the patients' chart and labs.  Questions were answered to the patient's satisfaction.   Discussed the issue with his papillomatous lesion of vocal cord with patient and anesthesia. Marked the left as the operative side  [name] J

## 2012-05-01 NOTE — Op Note (Signed)
William Lee 06/21/1946 [id number] 04/02/2012  Preoperative diagnosis: Left indirect inguinal hernia  Postoperative diagnosis: Same  Procedure: Repair of left indirect inguinal hernia with mesh  Surgeon: Currie Paris, MD, FACS  Asst.:Meekins, MSIII  Anesthesia:Spinal and Local supplimentation  Clinical History and Indications: This patient has asymptomatic, visible, reducible, left inguinal hernia that he wished to have repaired  Description of Procedure:The patient was seen in the holding area and the plans for the procedure as noted above confirmed with the patient. We reviewed again the risks and complications and the patient had no further questions. I then marked the left  as the operative side. This was confirmed with the patient.  The patient was taken to the operating room and after satisfactory Spinal anesthesia was obtained the left  inguinal area was prepped and draped as a sterile field. A time out was done.  I used an equal mixture of 0.5% plain Marcaine  to help with postoperative pain management. The area of the incision was infiltrated first as well as a field block going medially and inferiorly. I also injected some below the external oblique aponeurosis at the level of the anterior superior iliac spine.  An oblique incision was made and deepened to the external oblique aponeurosis. Bleeders were either cauterized or tied with 3-0 Vicryl. The external oblique aponeurosis was opened in the line of its fibers and elevated off of the underlying tissue. The ilioinguinal nerve was noted and protected.  The spermatic cord was then dissected up off of the inguinal floor and surrounded with a drain. The inguinal floor appeared intact. There is an indirect sac plus a lot of preperitoneal fatty tissue coming through the deep ring. The sac was agitated at space after opening to be sure there was no contents. The preperitoneal fat was also excised. We carefully avoided injury to  the vascular supply to the testis and the vas deferens.  I then took some Marlex mesh and cut it to shape. It was anchored at the pubic tubercle and a running 2-0 Prolene used to suture it to the edge of the inferior shelving edge of the external oblique. It was split laterally to go around the cord and then laid gently over the internal oblique medially. Several more sutures of 2-0 Prolene were used to anchor the mesh to the internal oblique. The tails of the mesh were crossed lateral to the cord structures and deep ring and tacked together.  This appeared to produce a nice coverage and repair with no tension. There was adequate space for the cord structures to exit through the mesh and deep ring.  I checked to make sure everything was dry. Additional local had been infiltrated as I was working to be sure we had complete anesthesia of the entire operative field.  The incision was then closed with a running 3-0 Vicryl on the external oblique, closing it over the repair. Scarpa's fascia was closed with a running 3-0 Vicryl and the skin with a running 4-0 Monocryl subcuticular and Dermabond on the skin.  The patient tolerated the procedure well. There were no operative complications. There was minimal blood loss. All counts were correct.  Currie Paris, MD, FACS 05/01/2012 12:29 PM

## 2012-05-01 NOTE — Anesthesia Postprocedure Evaluation (Signed)
  Anesthesia Post-op Note  Patient: William Lee  Procedure(s) Performed: Procedure(s) (LRB): HERNIA REPAIR INGUINAL ADULT (Left)  Patient Location: PACU  Anesthesia Type: General  Level of Consciousness: awake, alert  and oriented  Airway and Oxygen Therapy: Patient Spontanous Breathing  Post-op Pain: none  Post-op Assessment: Post-op Vital signs reviewed and Patient's Cardiovascular Status Stable  Post-op Vital Signs: stable  Complications: No apparent anesthesia complications

## 2012-05-01 NOTE — Anesthesia Preprocedure Evaluation (Addendum)
Anesthesia Evaluation  Patient identified by MRN, date of birth, ID band Patient awake    Reviewed: Allergy & Precautions, H&P , NPO status   Airway Mallampati: II      Dental  (+) Teeth Intact   Pulmonary  breath sounds clear to auscultation        Cardiovascular Rhythm:Regular Rate:Normal     Neuro/Psych    GI/Hepatic   Endo/Other    Renal/GU      Musculoskeletal   Abdominal   Peds  Hematology   Anesthesia Other Findings   Reproductive/Obstetrics                           Anesthesia Physical Anesthesia Plan  ASA: III  Anesthesia Plan: Regional and MAC   Post-op Pain Management:    Induction:   Airway Management Planned: Natural Airway  Additional Equipment:   Intra-op Plan:   Post-operative Plan:   Informed Consent: I have reviewed the patients History and Physical, chart, labs and discussed the procedure including the risks, benefits and alternatives for the proposed anesthesia with the patient or authorized representative who has indicated his/her understanding and acceptance.   Dental advisory given  Plan Discussed with:   Anesthesia Plan Comments: (Htn COPD CAD S/P CABG 1996, no angina H/O laryngeal papillomas, s/p resection X 2   Plan Spinal anesthesia with MAC  Kipp Brood, MD)        Anesthesia Quick Evaluation

## 2012-05-01 NOTE — H&P (View-Only) (Signed)
William Lee DOB: 1945/12/07 MRN: [id number]                                                                                      DATE: 04/02/2012  PCP: Kaleen Mask, MD, MD Referring Provider: Kaleen Mask, *  IMPRESSION:  Reducible Left Inguinal hernia COPD, stable  PLAN:   Repair with mesh, open, at his convenience. I have discussed with the patient the fact that he has an inguinal hernia and reviewed the pathophysiology of that diagnosis. I have given him educational materials about this. I discussed options for treatment including observation or repair and have discussed both laparoscopic and open inguinal hernia repairs as potential techniques. We have discussed the use of mesh. We have discussed risks of surgery including bleeding, infection, recurrence, postoperative pain and possible chronic groin pain, testicular injury, and potential issues with voiding. I believe the patient's questions have been answered and that he has a good understanding of the issues                  CC:  Chief Complaint  Patient presents with  . Hernia    New Pt. Eval [facility]    HPI:  William Lee is a 66 y.o.  male who presents for evaluation of a left inguinal hernia. He was lifting some heavy materials from his trunk about two months ago and felt a bulge in the left inguinal area. He also now has a little bit of discomfort in the left lower quadrant. The bulge seems to be improved but if he does a lot of activities or walks a lot he does notice discomfort in the groin. It's not gotten any worse. He started noticing symptoms on the right side. Interestingly, he used to have fairly hard firm stools and since this occurred they have been more soft. He has not had diarrhea or other abdominal symptoms however. He's never had a hernia before.  PMH:  has a past medical history of Ischemic heart disease; MI (myocardial infarction); COPD, severe; Papilloma of larynx; Chest pain; SOB (shortness  of breath) on exertion; Dyslipidemia; COPD (chronic obstructive pulmonary disease); Hyperlipidemia; Hypertension; GERD (gastroesophageal reflux disease); and Asthma.  PSH:   has past surgical history that includes Coronary artery bypass graft; Cholecystectomy (12/1995); and papalomas virus removal (3/10 7/10 5/11 10/12).  ALLERGIES:  No Known Allergies  MEDICATIONS: Current outpatient prescriptions:albuterol (PROVENTIL) (5 MG/ML) 0.5% nebulizer solution, Take 2.5 mg by nebulization every 6 (six) hours as needed.  , Disp: , Rfl: ;  aspirin 325 MG tablet, Take 325 mg by mouth daily.  , Disp: , Rfl: ;  atorvastatin (LIPITOR) 20 MG tablet, TAKE 1 TABLET EVERY DAY, Disp: 30 tablet, Rfl: 12;  budesonide-formoterol (SYMBICORT) 160-4.5 MCG/ACT inhaler, Inhale 2 puffs into the lungs 2 (two) times daily., Disp: , Rfl:  cyclobenzaprine (FLEXERIL) 10 MG tablet, as needed., Disp: , Rfl: ;  diltiazem (CARDIZEM CD) 120 MG 24 hr capsule, Take 120 mg by mouth at bedtime., Disp: , Rfl: ;  omeprazole (PRILOSEC) 20 MG capsule, Take 20 mg by mouth 2 (two) times daily. Take one capsule in the morning and  2 capsules in the evening, Disp: , Rfl: ;  potassium chloride (KLOR-CON) 20 MEQ packet, Take 20 mEq by mouth daily., Disp: , Rfl:  Tiotropium Bromide Monohydrate (SPIRIVA HANDIHALER IN), Inhale 1 capsule into the lungs daily. , Disp: , Rfl: ;  triamterene-hydrochlorothiazide (MAXZIDE-25) 37.5-25 MG per tablet, Take 1 tablet by mouth daily.  , Disp: , Rfl: ;  DISCONTD: atorvastatin (LIPITOR) 20 MG tablet, Take 20 mg by mouth daily., Disp: , Rfl:   ROS: He has filled out our 12 point review of systems and it is negative except for easy bruising. He has severe copd, but has not smoked since 1992 and walks about 3 miles on a treadmill. No need for supplemental O2.   EXAM:   VITAL SIGNS: BP 122/88  Pulse 86  Temp(Src) 98.6 F (37 C) (Temporal)  Resp 14  Ht 5\' 2"  (1.575 m)  Wt 131 lb (59.421 kg)  BMI 23.96  kg/m2  GENERAL:  The patient is alert, oriented, and generally healthy-appearing, NAD. Mood and affect are normal.  HEENT:  The head is normocephalic, the eyes nonicteric, the pupils were round regular and equal. EOMs are normal. Pharynx normal. Dentition good.  NECK:  The neck is supple and there are no masses or thyromegaly.  LUNGS:  Normal respirations and clear to auscultation.Somewhat distant breath sounds.  HEART:  Regular rhythm, with no murmurs rubs or gallops. Pulses are intact carotid dorsalis pedis and posterior tibial. No significant varicosities are noted.  ABDOMEN:  Soft, flat, and nontender. No masses or organomegaly is noted. No hernias are noted. Bowel sounds are normal.  GU:  Normal male. He has a left inguinal hernia presenting at the superficial ring and trying to extend towards the scrotum.  EXTREMITIES:  Good range of motion, no edema.   DATA REVIEWED:  Notes in Epic    Kaela Beitz J 04/02/2012  CC: Kaleen Mask, *, Kaleen Mask, MD, MD Cassell Clement

## 2012-05-01 NOTE — Anesthesia Procedure Notes (Addendum)
Procedure Name: MAC Performed by: Meyer Russel Pre-anesthesia Checklist: Patient identified, Emergency Drugs available, Suction available and Patient being monitored Patient Re-evaluated:Patient Re-evaluated prior to inductionOxygen Delivery Method: Simple face mask Preoxygenation: Pre-oxygenation with 100% oxygen Intubation Type: IV induction    Anesthesia Regional Block:   Narrative:    Spinal  Patient location during procedure: OR Start time: 05/01/2012 11:10 AM End time: 05/01/2012 11:18 AM Staffing Performed by: anesthesiologist  Preanesthetic Checklist Completed: patient identified, site marked, surgical consent, pre-op evaluation, timeout performed, IV checked, risks and benefits discussed and monitors and equipment checked Spinal Block Patient position: right lateral decubitus Prep: Betadine Patient monitoring: heart rate, cardiac monitor, continuous pulse ox and blood pressure Approach: midline Location: L3-4 Injection technique: single-shot Needle Needle type: Tuohy  Needle gauge: 22 G Needle length: 5 cm Needle insertion depth: 2.5 cm Assessment Sensory level: T10 Additional Notes First pass, midline L3-4 clear CSF, 40 mg. Of 2% lidocaine with epinephrine injected easily.  Kipp Brood, MD

## 2012-05-01 NOTE — Transfer of Care (Signed)
Immediate Anesthesia Transfer of Care Note  Patient: William Lee  Procedure(s) Performed: Procedure(s) (LRB): HERNIA REPAIR INGUINAL ADULT (Left)  Patient Location: PACU  Anesthesia Type: Spinal  Level of Consciousness: awake, alert  and oriented  Airway & Oxygen Therapy: Patient Spontanous Breathing and Patient connected to face mask oxygen  Post-op Assessment: Report given to PACU RN and Post -op Vital signs reviewed and stable, spinal level @ L5 right, L4 left  right  Post vital signs: Reviewed and stable  Complications: No apparent anesthesia complications

## 2012-05-08 ENCOUNTER — Encounter (HOSPITAL_BASED_OUTPATIENT_CLINIC_OR_DEPARTMENT_OTHER): Payer: Self-pay | Admitting: Surgery

## 2012-05-21 ENCOUNTER — Ambulatory Visit (INDEPENDENT_AMBULATORY_CARE_PROVIDER_SITE_OTHER): Payer: Medicare Other | Admitting: Surgery

## 2012-05-21 ENCOUNTER — Encounter (INDEPENDENT_AMBULATORY_CARE_PROVIDER_SITE_OTHER): Payer: Self-pay | Admitting: Surgery

## 2012-05-21 VITALS — BP 128/84 | HR 77 | Temp 98.6°F | Resp 18 | Ht 63.0 in | Wt 129.6 lb

## 2012-05-21 DIAGNOSIS — K4090 Unilateral inguinal hernia, without obstruction or gangrene, not specified as recurrent: Secondary | ICD-10-CM

## 2012-05-21 NOTE — Patient Instructions (Signed)
Continue to avoid lifting anything over 15 pounds for 3 more weeks. We will see you again on an as needed basis. Please call the office at [phone] if you have any questions or concerns. Thank you for allowing [place] to take care of you.

## 2012-05-21 NOTE — Progress Notes (Signed)
NAME: William Lee                                            DOB: 08/22/1946 DATE: 05/21/2012                                                  MRN: [id number]  CC: Post op   HPI: This patient comes in for post op follow-up.Heunderwent Left inguinal hernia repair on 05/01/2012. He feels that he is doing well.  PE:  VITAL SIGNS: BP 128/84  Pulse 77  Temp 98.6 F (37 C) (Temporal)  Resp 18  Ht 5\' 3"  (1.6 m)  Wt 129 lb 9.6 oz (58.786 kg)  BMI 22.96 kg/m2  General: The patient appears to be healthy, NAD Incision is healing well. There is no evidence of infection or other problems.  DATA REVIEWED: No new data  IMPRESSION: The patient is doing well S/P Repair of left Inguinal hernia.    PLAN: I will see againwhen necessary

## 2012-09-04 ENCOUNTER — Ambulatory Visit: Payer: Medicare Other | Admitting: Cardiology

## 2012-09-19 ENCOUNTER — Other Ambulatory Visit: Payer: Self-pay | Admitting: *Deleted

## 2012-09-19 DIAGNOSIS — I259 Chronic ischemic heart disease, unspecified: Secondary | ICD-10-CM

## 2012-09-19 DIAGNOSIS — E785 Hyperlipidemia, unspecified: Secondary | ICD-10-CM

## 2012-09-19 DIAGNOSIS — I219 Acute myocardial infarction, unspecified: Secondary | ICD-10-CM

## 2012-09-20 ENCOUNTER — Encounter: Payer: Self-pay | Admitting: Cardiology

## 2012-09-20 ENCOUNTER — Other Ambulatory Visit (INDEPENDENT_AMBULATORY_CARE_PROVIDER_SITE_OTHER): Payer: Medicare Other

## 2012-09-20 ENCOUNTER — Ambulatory Visit (INDEPENDENT_AMBULATORY_CARE_PROVIDER_SITE_OTHER): Payer: Medicare Other | Admitting: Cardiology

## 2012-09-20 VITALS — BP 122/72 | HR 69 | Resp 19 | Ht 63.0 in | Wt 127.8 lb

## 2012-09-20 DIAGNOSIS — E785 Hyperlipidemia, unspecified: Secondary | ICD-10-CM

## 2012-09-20 DIAGNOSIS — I259 Chronic ischemic heart disease, unspecified: Secondary | ICD-10-CM

## 2012-09-20 DIAGNOSIS — I219 Acute myocardial infarction, unspecified: Secondary | ICD-10-CM

## 2012-09-20 LAB — CBC WITH DIFFERENTIAL/PLATELET
Basophils Absolute: 0 10*3/uL (ref 0.0–0.1)
Basophils Relative: 0.3 % (ref 0.0–3.0)
Eosinophils Absolute: 0.1 10*3/uL (ref 0.0–0.7)
Eosinophils Relative: 1.5 % (ref 0.0–5.0)
HCT: 46.8 % (ref 39.0–52.0)
Hemoglobin: 15.4 g/dL (ref 13.0–17.0)
Lymphocytes Relative: 12.5 % (ref 12.0–46.0)
Lymphs Abs: 0.8 10*3/uL (ref 0.7–4.0)
MCHC: 33 g/dL (ref 30.0–36.0)
MCV: 92.2 fl (ref 78.0–100.0)
Monocytes Absolute: 0.6 10*3/uL (ref 0.1–1.0)
Monocytes Relative: 8.5 % (ref 3.0–12.0)
Neutro Abs: 5.2 10*3/uL (ref 1.4–7.7)
Neutrophils Relative %: 77.2 % — ABNORMAL HIGH (ref 43.0–77.0)
Platelets: 263 10*3/uL (ref 150.0–400.0)
RBC: 5.07 Mil/uL (ref 4.22–5.81)
RDW: 14.2 % (ref 11.5–14.6)
WBC: 6.8 10*3/uL (ref 4.5–10.5)

## 2012-09-20 LAB — HEPATIC FUNCTION PANEL
ALT: 13 U/L (ref 0–53)
AST: 16 U/L (ref 0–37)
Albumin: 4 g/dL (ref 3.5–5.2)
Alkaline Phosphatase: 72 U/L (ref 39–117)
Bilirubin, Direct: 0.2 mg/dL (ref 0.0–0.3)
Total Bilirubin: 2.2 mg/dL — ABNORMAL HIGH (ref 0.3–1.2)
Total Protein: 7.4 g/dL (ref 6.0–8.3)

## 2012-09-20 LAB — LIPID PANEL
Cholesterol: 160 mg/dL (ref 0–200)
HDL: 43.2 mg/dL (ref >39.00)
LDL Cholesterol: 85 mg/dL (ref 0–99)
Total CHOL/HDL Ratio: 4
Triglycerides: 160 mg/dL — ABNORMAL HIGH (ref 0.0–149.0)
VLDL: 32 mg/dL (ref 0.0–40.0)

## 2012-09-20 LAB — BASIC METABOLIC PANEL
BUN: 13 mg/dL (ref 6–23)
CO2: 31 mEq/L (ref 19–32)
Calcium: 9.1 mg/dL (ref 8.4–10.5)
Chloride: 99 mEq/L (ref 96–112)
Creatinine, Ser: 1.1 mg/dL (ref 0.4–1.5)
GFR: 72.37 mL/min (ref >60.00)
Glucose, Bld: 120 mg/dL — ABNORMAL HIGH (ref 70–99)
Potassium: 3.3 mEq/L — ABNORMAL LOW (ref 3.5–5.1)
Sodium: 138 mEq/L (ref 135–145)

## 2012-09-20 NOTE — Progress Notes (Signed)
William Lee Date of Birth:  [birth year] [facility] [address] [place], [place]  [address] [phone]  Fax   [phone]  HPI: This pleasant 66 year old gentleman is seen for a scheduled 6 month followup office visit. The patient has a history of known ischemic heart disease. He had an acute inferior wall myocardial infarction in August 1996. He underwent subsequent coronary artery bypass graft surgery. Patient has a history of severe COPD. Patient has known left ventricular dysfunction with previous ejection fraction by echocardiogram showing an ejection fraction of 30-35% with significant inferoposterior wall wall akinesis. The patient also has known mild to moderate mitral regurgitation by echo. The patient has a history of hyperlipidemia and is on Lipitor.  Since we last saw the patient he had successful inguinal hernia repair by Dr. Jamey Ripa.  Surgery was done under regional anesthesia because of his previous known laryngeal problems.  Current Outpatient Prescriptions  Medication Sig Dispense Refill  . AFLURIA injection       . albuterol (PROVENTIL) (5 MG/ML) 0.5% nebulizer solution Take 2.5 mg by nebulization every 6 (six) hours as needed.        Marland Kitchen aspirin 325 MG tablet Take 325 mg by mouth daily.        Marland Kitchen atorvastatin (LIPITOR) 20 MG tablet TAKE 1 TABLET EVERY DAY  30 tablet  12  . budesonide-formoterol (SYMBICORT) 160-4.5 MCG/ACT inhaler Inhale 2 puffs into the lungs 2 (two) times daily.      . cyclobenzaprine (FLEXERIL) 10 MG tablet as needed.      . diltiazem (CARDIZEM CD) 120 MG 24 hr capsule Take 120 mg by mouth at bedtime.      . fluticasone (FLONASE) 50 MCG/ACT nasal spray Place 2 sprays into the nose daily.      Marland Kitchen omeprazole (PRILOSEC) 20 MG capsule Take 40 mg by mouth 2 (two) times daily. Take one capsule in the morning and 2 capsules in the evening      . potassium chloride (KLOR-CON) 20 MEQ packet Take 20 mEq by mouth daily.      . Tiotropium  Bromide Monohydrate (SPIRIVA HANDIHALER IN) Inhale 1 capsule into the lungs daily.       Marland Kitchen triamterene-hydrochlorothiazide (MAXZIDE-25) 37.5-25 MG per tablet Take 1 tablet by mouth daily.          No Known Allergies  Patient Active Problem List  Diagnosis  . Ischemic heart disease  . MI (myocardial infarction)  . COPD, severe  . Papilloma of larynx  . Chest pain  . Dyslipidemia  . Shortness of breath  . Community acquired pneumonia    History  Smoking status  . Former Smoker  . Quit date: 10/30/1990  Smokeless tobacco  . Not on file    History  Alcohol Use No    Family History  Problem Relation Age of Onset  . Cancer Mother     Liver  . Cancer Father     Bone  . Cancer Sister     lung  . Cancer Brother     unknown    Review of Systems: The patient denies any heat or cold intolerance.  No weight gain or weight loss.  The patient denies headaches or blurry vision.  There is no cough or sputum production.  The patient denies dizziness.  There is no hematuria or hematochezia.  The patient denies any muscle aches or arthritis.  The patient denies any rash.  The patient denies frequent falling or instability.  There is no history of depression or anxiety.  All other systems were reviewed and are negative.   Physical Exam: Filed Vitals:   09/20/12 0953  BP: 122/72  Pulse: 69  Resp: 19   the general appearance reveals a small middle-aged gentleman in no acute distress.  His voice is slightly hoarse as usual.The head and neck exam reveals pupils equal and reactive.  Extraocular movements are full.  There is no scleral icterus.  The mouth and pharynx are normal.  The neck is supple.  The carotids reveal no bruits.  The jugular venous pressure is normal.  The  thyroid is not enlarged.  There is no lymphadenopathy.  The chest is clear to percussion and auscultation.  There are no rales or rhonchi.  Expansion of the chest is symmetrical.  The precordium is quiet.  The first  heart sound is normal.  The second heart sound is physiologically split.  There is no murmur gallop rub or click.  There is no abnormal lift or heave.  The abdomen is soft and nontender.  The bowel sounds are normal.  The liver and spleen are not enlarged.  There are no abdominal masses.  There are no abdominal bruits.  Extremities reveal good pedal pulses.  There is no phlebitis or edema.  There is no cyanosis or clubbing.  Strength is normal and symmetrical in all extremities.  There is no lateralizing weakness.  There are no sensory deficits.  The skin is warm and dry.  There is no rash.      Assessment / Plan: Overall the patient is doing well.  Continue same medication and be rechecked in 6 months for office visit lipid panel hepatic function panel and basal metabolic panel.

## 2012-09-20 NOTE — Assessment & Plan Note (Signed)
On the patient has not been expressing any chest pain or angina pectoris.  He has not had any recent sublingual nitroglycerin.

## 2012-09-20 NOTE — Patient Instructions (Addendum)
Will obtain labs today and call you with the results (lp/bmet/hfp)  Your physician recommends that you continue on your current medications as directed. Please refer to the Current Medication list given to you today.  Your physician wants you to follow-up in: 6 months with fasting labs (lp/bmet/hfp)  You will receive a reminder letter in the mail two months in advance. If you don't receive a letter, please call our office to schedule the follow-up appointment.  

## 2012-09-20 NOTE — Assessment & Plan Note (Signed)
The patient remains on low-dose Lipitor 20 mg daily.  He is not having any myalgias.  We are checking lab work today

## 2012-09-20 NOTE — Assessment & Plan Note (Signed)
His dyspnea waxes and wanes in severity.  Overall he is no worse than on his last visit.  He is not running a fever and has not cough or sputum production at this time

## 2012-10-22 ENCOUNTER — Telehealth: Payer: Self-pay | Admitting: *Deleted

## 2012-10-22 NOTE — Telephone Encounter (Signed)
Copy has been mailed to patient.

## 2012-10-22 NOTE — Telephone Encounter (Signed)
Message copied by Burnell Blanks on Tue Oct 22, 2012  9:15 AM ------      Message from: Cassell Clement      Created: Sun Sep 22, 2012  5:44 PM       Rest of lab okay.  TG high so watch carbs.  Increase K as discussed.

## 2013-03-27 ENCOUNTER — Ambulatory Visit (INDEPENDENT_AMBULATORY_CARE_PROVIDER_SITE_OTHER): Payer: Medicare Other | Admitting: Cardiology

## 2013-03-27 ENCOUNTER — Encounter: Payer: Self-pay | Admitting: Cardiology

## 2013-03-27 VITALS — BP 130/76 | HR 80 | Ht 62.5 in | Wt 127.0 lb

## 2013-03-27 DIAGNOSIS — E785 Hyperlipidemia, unspecified: Secondary | ICD-10-CM

## 2013-03-27 DIAGNOSIS — I259 Chronic ischemic heart disease, unspecified: Secondary | ICD-10-CM

## 2013-03-27 LAB — BASIC METABOLIC PANEL
BUN: 15 mg/dL (ref 6–23)
CO2: 30 mEq/L (ref 19–32)
Calcium: 9.2 mg/dL (ref 8.4–10.5)
Chloride: 100 mEq/L (ref 96–112)
Creatinine, Ser: 1.2 mg/dL (ref 0.4–1.5)
GFR: 62.18 mL/min (ref >60.00)
Glucose, Bld: 114 mg/dL — ABNORMAL HIGH (ref 70–99)
Potassium: 3.8 mEq/L (ref 3.5–5.1)
Sodium: 137 mEq/L (ref 135–145)

## 2013-03-27 LAB — HEPATIC FUNCTION PANEL
ALT: 19 U/L (ref 0–53)
AST: 22 U/L (ref 0–37)
Albumin: 4 g/dL (ref 3.5–5.2)
Alkaline Phosphatase: 61 U/L (ref 39–117)
Bilirubin, Direct: 0.2 mg/dL (ref 0.0–0.3)
Total Bilirubin: 2.4 mg/dL — ABNORMAL HIGH (ref 0.3–1.2)
Total Protein: 7.1 g/dL (ref 6.0–8.3)

## 2013-03-27 LAB — LIPID PANEL
Cholesterol: 174 mg/dL (ref 0–200)
HDL: 54.7 mg/dL (ref >39.00)
LDL Cholesterol: 101 mg/dL — ABNORMAL HIGH (ref 0–99)
Total CHOL/HDL Ratio: 3
Triglycerides: 93 mg/dL (ref 0.0–149.0)
VLDL: 18.6 mg/dL (ref 0.0–40.0)

## 2013-03-27 NOTE — Progress Notes (Signed)
William Lee Date of Birth:  1946-10-22 [facility] [address] [place], [place]  [address] [phone]  Fax   [phone]  HPI: This pleasant 67 year old gentleman is seen for a scheduled 6 month followup office visit. The patient has a history of known ischemic heart disease. He had an acute inferior wall myocardial infarction in August 1996. He underwent subsequent coronary artery bypass graft surgery. Patient has a history of severe COPD. Patient has known left ventricular dysfunction with previous ejection fraction by echocardiogram showing an ejection fraction of 30-35% with significant inferoposterior wall wall akinesis. The patient also has known mild to moderate mitral regurgitation by echo. The patient has a history of hyperlipidemia and is on Lipitor.  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  His last surgery was in August 2013.  Current Outpatient Prescriptions  Medication Sig Dispense Refill  . AFLURIA injection       . albuterol (PROVENTIL) (5 MG/ML) 0.5% nebulizer solution Take 2.5 mg by nebulization every 6 (six) hours as needed.        Marland Kitchen aspirin 81 MG tablet Take 81 mg by mouth daily.      Marland Kitchen atorvastatin (LIPITOR) 20 MG tablet TAKE 1 TABLET EVERY DAY  30 tablet  12  . budesonide-formoterol (SYMBICORT) 160-4.5 MCG/ACT inhaler Inhale 2 puffs into the lungs 2 (two) times daily.      . cyclobenzaprine (FLEXERIL) 10 MG tablet as needed.      . diltiazem (CARDIZEM CD) 120 MG 24 hr capsule Take 120 mg by mouth at bedtime.      . fluticasone (FLONASE) 50 MCG/ACT nasal spray Place 2 sprays into the nose daily.      Marland Kitchen omeprazole (PRILOSEC) 20 MG capsule Take 40 mg by mouth 2 (two) times daily. Take one capsule in the morning and 2 capsules in the evening      . potassium chloride (KLOR-CON) 20 MEQ packet Take 20 mEq by mouth daily.      . Tiotropium Bromide Monohydrate (SPIRIVA HANDIHALER IN) Inhale 1 capsule into the lungs  daily.       Marland Kitchen triamterene-hydrochlorothiazide (MAXZIDE-25) 37.5-25 MG per tablet Take 1 tablet by mouth daily.         No current facility-administered medications for this visit.    No Known Allergies  Patient Active Problem List   Diagnosis Date Noted  . Dyslipidemia     Priority: High  . Community acquired pneumonia 11/12/2011  . Shortness of breath 11/11/2011  . Ischemic heart disease   . MI (myocardial infarction)   . COPD, severe   . Papilloma of larynx   . Chest pain     History  Smoking status  . Former Smoker  . Quit date: 10/30/1990  Smokeless tobacco  . Not on file    History  Alcohol Use No    Family History  Problem Relation Age of Onset  . Cancer Mother     Liver  . Cancer Father     Bone  . Cancer Sister     lung  . Cancer Brother     unknown    Review of Systems: The patient denies any heat or cold intolerance.  No weight gain or weight loss.  The patient denies headaches or blurry vision.  There is no cough or sputum production.  The patient denies dizziness.  There is no hematuria or hematochezia.  The patient denies any muscle aches or arthritis.  The patient denies any rash.  The patient denies frequent falling or instability.  There is no history of depression or anxiety.  All other systems were reviewed and are negative.   Physical Exam: Filed Vitals:   03/27/13 0917  BP: 130/76  Pulse: 80   the general appearance reveals a well-developed well-nourished gentleman in no distress.The head and neck exam reveals pupils equal and reactive.  Extraocular movements are full.  There is no scleral icterus.  The mouth and pharynx are normal.  The neck is supple.  The carotids reveal no bruits.  The jugular venous pressure is normal.  The  thyroid is not enlarged.  There is no lymphadenopathy.  The chest is clear to percussion and auscultation.  There are no rales or rhonchi.  Expansion of the chest is symmetrical.  The precordium is quiet.  The first  heart sound is normal.  The second heart sound is physiologically split.  There is no murmur gallop rub or click.  There is no abnormal lift or heave.  The abdomen is soft and nontender.  The bowel sounds are normal.  The liver and spleen are not enlarged.  There are no abdominal masses.  There are no abdominal bruits.  Extremities reveal good pedal pulses.  There is no phlebitis or edema.  There is no cyanosis or clubbing.  Strength is normal and symmetrical in all extremities.  There is no lateralizing weakness.  There are no sensory deficits.  The skin is warm and dry.  There is no rash.      Assessment / Plan: Continue same medication.  Blood work today pending.  We are reducing his aspirin to just 81 mg daily because of excessive bruising of the skin. Recheck in 6 months for followup office visit EKG lipid panel hepatic function panel and basal metabolic panel.

## 2013-03-27 NOTE — Patient Instructions (Signed)
Will obtain labs today and call you with the results (lp/bmet/hfp)  DECREASE YOUR ASPIRIN TO 81 MG DAILY  Your physician wants you to follow-up in: 6 months with fasting labs (lp/bmet/hfp) You will receive a reminder letter in the mail two months in advance. If you don't receive a letter, please call our office to schedule the follow-up appointment.

## 2013-03-27 NOTE — Assessment & Plan Note (Signed)
His COPD has been stable.  No recent bronchitis exacerbation

## 2013-03-27 NOTE — Assessment & Plan Note (Signed)
The patient has had no recurrence of angina pectoris.  He has not taken any nitroglycerin since 1996 when he had his heart surgery.

## 2013-03-27 NOTE — Progress Notes (Signed)
Quick Note:  Please report to patient. The recent labs are stable. Continue same medication and careful diet. ______ 

## 2013-03-27 NOTE — Assessment & Plan Note (Signed)
The patient has a history of hypercholesterolemia and is on Lipitor.  The patient has not been experiencing any myalgias from the statin therapy

## 2013-03-28 ENCOUNTER — Telehealth: Payer: Self-pay | Admitting: *Deleted

## 2013-03-28 NOTE — Telephone Encounter (Signed)
Advised patient of lab results  

## 2013-03-28 NOTE — Telephone Encounter (Signed)
Message copied by Burnell Blanks on Fri Mar 28, 2013 10:25 AM ------      Message from: Cassell Clement      Created: Thu Mar 27, 2013  5:47 PM       Please report to patient.  The recent labs are stable. Continue same medication and careful diet. ------

## 2013-08-26 ENCOUNTER — Encounter (HOSPITAL_COMMUNITY): Payer: Self-pay | Admitting: *Deleted

## 2013-08-26 ENCOUNTER — Encounter (HOSPITAL_COMMUNITY): Payer: Self-pay | Admitting: Pharmacy Technician

## 2013-08-26 NOTE — Progress Notes (Signed)
08-26-13 PAT Appt. Sch. 09-12-13 11AM, pt. To see anesthesia,vital signs, labs, ekg.

## 2013-09-02 ENCOUNTER — Ambulatory Visit (INDEPENDENT_AMBULATORY_CARE_PROVIDER_SITE_OTHER): Payer: Medicare Other | Admitting: Cardiology

## 2013-09-02 ENCOUNTER — Encounter: Payer: Self-pay | Admitting: Cardiology

## 2013-09-02 VITALS — BP 150/80 | HR 90 | Ht 62.5 in | Wt 128.0 lb

## 2013-09-02 DIAGNOSIS — E785 Hyperlipidemia, unspecified: Secondary | ICD-10-CM

## 2013-09-02 DIAGNOSIS — R0602 Shortness of breath: Secondary | ICD-10-CM

## 2013-09-02 DIAGNOSIS — I259 Chronic ischemic heart disease, unspecified: Secondary | ICD-10-CM

## 2013-09-02 LAB — BASIC METABOLIC PANEL
BUN: 14 mg/dL (ref 6–23)
CO2: 30 mEq/L (ref 19–32)
Calcium: 9.3 mg/dL (ref 8.4–10.5)
Chloride: 98 mEq/L (ref 96–112)
Creatinine, Ser: 1.1 mg/dL (ref 0.4–1.5)
GFR: 67.8 mL/min (ref >60.00)
Glucose, Bld: 105 mg/dL — ABNORMAL HIGH (ref 70–99)
Potassium: 3.5 mEq/L (ref 3.5–5.1)
Sodium: 135 mEq/L (ref 135–145)

## 2013-09-02 LAB — HEPATIC FUNCTION PANEL
ALT: 15 U/L (ref 0–53)
AST: 18 U/L (ref 0–37)
Albumin: 3.9 g/dL (ref 3.5–5.2)
Alkaline Phosphatase: 61 U/L (ref 39–117)
Bilirubin, Direct: 0.2 mg/dL (ref 0.0–0.3)
Total Bilirubin: 2 mg/dL — ABNORMAL HIGH (ref 0.3–1.2)
Total Protein: 7 g/dL (ref 6.0–8.3)

## 2013-09-02 LAB — LIPID PANEL
Cholesterol: 148 mg/dL (ref 0–200)
HDL: 47 mg/dL (ref >39.00)
LDL Cholesterol: 77 mg/dL (ref 0–99)
Total CHOL/HDL Ratio: 3
Triglycerides: 121 mg/dL (ref 0.0–149.0)
VLDL: 24.2 mg/dL (ref 0.0–40.0)

## 2013-09-02 NOTE — Assessment & Plan Note (Signed)
The patient has a history of dyslipidemia and is on Lipitor.  We are checking lab work today

## 2013-09-02 NOTE — Patient Instructions (Signed)
Will obtain labs today and call you with the results (lp/bmet/hfp)  Your physician recommends that you continue on your current medications as directed. Please refer to the Current Medication list given to you today.  Your physician wants you to follow-up in: 6 months with fasting labs (lp/bmet/hfp)  You will receive a reminder letter in the mail two months in advance. If you don't receive a letter, please call our office to schedule the follow-up appointment.  

## 2013-09-02 NOTE — Progress Notes (Signed)
Quick Note:  Please report to patient. The recent labs are stable. Continue same medication and careful diet. ______ 

## 2013-09-02 NOTE — Progress Notes (Signed)
William Lee Date of Birth:  09/13/1946 [address] [place], [place]  [address] [phone]  Fax   [phone]  HPI: This pleasant 67 year old gentleman is seen for a scheduled 6 month followup office visit. The patient has a history of known ischemic heart disease. He had an acute inferior wall myocardial infarction in August 1996. He underwent subsequent coronary artery bypass graft surgery. Patient has a history of severe COPD. Patient has known left ventricular dysfunction with previous ejection fraction by echocardiogram showing an ejection fraction of 30-35% with significant inferoposterior wall wall akinesis. The patient also has known mild to moderate mitral regurgitation by echo. The patient has a history of hyperlipidemia and is on Lipitor.  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  His last surgery was in August 2013.  Since last visit he had a positive stool Hemoccult and has seen Dr. Randa Evens and is scheduled for upper endoscopy and colonoscopy at the hospital on 09/23/13.  Current Outpatient Prescriptions  Medication Sig Dispense Refill  . albuterol (PROVENTIL HFA;VENTOLIN HFA) 108 (90 BASE) MCG/ACT inhaler Inhale 2 puffs into the lungs every 6 (six) hours as needed for wheezing.      Marland Kitchen albuterol (PROVENTIL) (5 MG/ML) 0.5% nebulizer solution Take 2.5 mg by nebulization every 6 (six) hours as needed for wheezing or shortness of breath.       Marland Kitchen aspirin 81 MG tablet Take 81 mg by mouth daily.      Marland Kitchen atorvastatin (LIPITOR) 20 MG tablet Take 20 mg by mouth daily.      . budesonide-formoterol (SYMBICORT) 160-4.5 MCG/ACT inhaler Inhale 2 puffs into the lungs 2 (two) times daily.      . cyclobenzaprine (FLEXERIL) 10 MG tablet Take 10 mg by mouth 2 (two) times daily as needed for muscle spasms.       Marland Kitchen diltiazem (CARDIZEM CD) 120 MG 24 hr capsule Take 120 mg by mouth at bedtime.      . fluticasone (FLONASE) 50 MCG/ACT nasal spray Place 2  sprays into the nose daily as needed for rhinitis.       Marland Kitchen omeprazole (PRILOSEC) 20 MG capsule Take 40 mg by mouth 2 (two) times daily. Take one capsule in the morning and 2 capsules in the evening      . potassium chloride (KLOR-CON) 20 MEQ packet Take 20 mEq by mouth 2 (two) times daily.       . Tiotropium Bromide Monohydrate (SPIRIVA HANDIHALER IN) Inhale 1 capsule into the lungs daily.       . traMADol (ULTRAM) 50 MG tablet Take 25 mg by mouth every 6 (six) hours as needed for pain.       Marland Kitchen triamterene-hydrochlorothiazide (MAXZIDE-25) 37.5-25 MG per tablet Take 1 tablet by mouth daily.        No current facility-administered medications for this visit.    No Known Allergies  Patient Active Problem List   Diagnosis Date Noted  . Dyslipidemia     Priority: High  . Community acquired pneumonia 11/12/2011  . Shortness of breath 11/11/2011  . Ischemic heart disease   . MI (myocardial infarction)   . COPD, severe   . Papilloma of larynx   . Chest pain     History  Smoking status  . Former Smoker  . Quit date: 10/30/1990  Smokeless tobacco  . Not on file    History  Alcohol Use No    Family History  Problem Relation Age of Onset  . Cancer Mother     Liver  . Cancer Father     Bone  . Cancer Sister     lung  . Cancer Brother     unknown    Review of Systems: The patient denies any heat or cold intolerance.  No weight gain or weight loss.  The patient denies headaches or blurry vision.  There is no cough or sputum production.  The patient denies dizziness.  There is no hematuria or hematochezia.  The patient denies any muscle aches or arthritis.  The patient denies any rash.  The patient denies frequent falling or instability.  There is no history of depression or anxiety.  All other systems were reviewed and are negative.   Physical Exam: Filed Vitals:   09/02/13 1130  BP: 150/80  Pulse: 90   the general appearance reveals a well-developed well-nourished  gentleman in no distress.The head and neck exam reveals pupils equal and reactive.  Extraocular movements are full.  There is no scleral icterus.  The mouth and pharynx are normal.  The neck is supple.  The carotids reveal no bruits.  The jugular venous pressure is normal.  The  thyroid is not enlarged.  There is no lymphadenopathy.  The chest is clear to percussion and auscultation.  Breath sounds are distant.  There are no rales or rhonchi.  Expansion of the chest is symmetrical.  The precordium is quiet.  The first heart sound is normal.  The second heart sound is physiologically split.  There is no murmur gallop rub or click.  There is no abnormal lift or heave.  The abdomen is soft and nontender.  The bowel sounds are normal.  The liver and spleen are not enlarged.  There are no abdominal masses.  There are no abdominal bruits.  Extremities reveal good pedal pulses.  There is no phlebitis or edema.  There is no cyanosis or clubbing.  Strength is normal and symmetrical in all extremities.  There is no lateralizing weakness.  There are no sensory deficits.  The skin is warm and dry.  There is no rash.      Assessment / Plan: Continue same medication.  Blood work today pending.  We are reducing his aspirin to just 81 mg daily because of excessive bruising of the skin. Recheck in 6 months for followup office visit EKG lipid panel hepatic function panel and basal metabolic panel.

## 2013-09-02 NOTE — Assessment & Plan Note (Signed)
The patient has not been having any chest pain.  He has not had to take any sublingual nitroglycerin

## 2013-09-02 NOTE — Assessment & Plan Note (Signed)
The patient has significant exertional dyspnea related to his severe COPD.  He is not having any productive cough fever chills or night sweats.

## 2013-09-03 ENCOUNTER — Telehealth: Payer: Self-pay | Admitting: *Deleted

## 2013-09-03 NOTE — Telephone Encounter (Signed)
Message copied by Burnell Blanks on Wed Sep 03, 2013 11:55 AM ------      Message from: Cassell Clement      Created: Tue Sep 02, 2013  4:08 PM       Please report to patient.  The recent labs are stable. Continue same medication and careful diet. ------

## 2013-09-03 NOTE — Telephone Encounter (Signed)
Advised patient of lab results  

## 2013-09-11 ENCOUNTER — Encounter (HOSPITAL_COMMUNITY)
Admission: RE | Admit: 2013-09-11 | Discharge: 2013-09-11 | Disposition: A | Discharge status: Home / Self Care | Payer: Medicare Other | Source: Ambulatory Visit | Attending: Orthopedic Surgery | Admitting: Orthopedic Surgery

## 2013-09-11 ENCOUNTER — Encounter (HOSPITAL_COMMUNITY): Payer: Self-pay

## 2013-09-11 DIAGNOSIS — H9193 Unspecified hearing loss, bilateral: Secondary | ICD-10-CM

## 2013-09-11 HISTORY — DX: Unspecified hearing loss, bilateral: H91.93

## 2013-09-11 NOTE — Pre-Procedure Instructions (Signed)
09-11-13 1140 Dr. Leta Jungling in to see for anesthesia consult. Questions answered. EKG of 1'13 Epic okay to use. Labs-CMP,Lipid panel done 09-02-13 Epic. Review of pre-prodecure instructions with copy given. W. Kennon Portela

## 2013-09-11 NOTE — Patient Instructions (Signed)
Your procedure is scheduled on: 09-23-13 Report to [facility] Admitting [id number] am Call this number if you have problems morning of your procedure:[phone]  Follow all bowel prep instructions per your doctor's orders.  Do not eat or drink anything after midnight the night before your procedure. You may brush your teeth, rinse out your mouth, but no water, no food, no chewing gum, no mints, no candies, no chewing tobacco.     Take these medicines the morning of your procedure with A SIP OF WATER: Omeprazole. Use Inhalers as usual and bring.   Please make arrangements for a responsible person to drive you home after the procedure. You cannot go home by cab/taxi. We recommend you have someone with you at home the first 24 hours after your procedure. Driver for procedure is- spouse  LEAVE ALL VALUABLES, JEWELRY, BILLFOLD AT HOME.  NO DENTURES, CONTACT LENSES ALLOWED IN THE ENDOSCOPY ROOM.   YOU MAY WEAR DEODORANT, PLEASE REMOVE ALL JEWELRY, WATCHES RINGS, BODY PIERCINGS AND LEAVE AT HOME.   WOMEN: NO MAKE-UP, LOTIONS PERFUMES

## 2013-09-12 ENCOUNTER — Other Ambulatory Visit (HOSPITAL_COMMUNITY): Payer: Medicare Other

## 2013-09-22 ENCOUNTER — Other Ambulatory Visit: Payer: Self-pay | Admitting: Gastroenterology

## 2013-09-22 NOTE — Addendum Note (Signed)
Addended by: Kimla Furth JR. on: 09/22/2013 10:09 AM   Modules accepted: Orders  

## 2013-09-23 ENCOUNTER — Encounter (HOSPITAL_COMMUNITY): Payer: Self-pay | Admitting: *Deleted

## 2013-09-23 ENCOUNTER — Encounter (HOSPITAL_COMMUNITY)
Admission: RE | Disposition: A | Discharge status: Home / Self Care | Payer: Self-pay | Source: Ambulatory Visit | Attending: Gastroenterology

## 2013-09-23 ENCOUNTER — Encounter (HOSPITAL_COMMUNITY): Payer: Medicare Other | Admitting: Anesthesiology

## 2013-09-23 ENCOUNTER — Ambulatory Visit (HOSPITAL_COMMUNITY)
Admission: RE | Admit: 2013-09-23 | Discharge: 2013-09-23 | Disposition: A | Discharge status: Home / Self Care | Payer: Medicare Other | Source: Ambulatory Visit | Attending: Gastroenterology | Admitting: Gastroenterology

## 2013-09-23 ENCOUNTER — Ambulatory Visit (HOSPITAL_COMMUNITY): Payer: Medicare Other | Admitting: Anesthesiology

## 2013-09-23 ENCOUNTER — Other Ambulatory Visit: Payer: Self-pay

## 2013-09-23 DIAGNOSIS — K5730 Diverticulosis of large intestine without perforation or abscess without bleeding: Secondary | ICD-10-CM | POA: Insufficient documentation

## 2013-09-23 DIAGNOSIS — I10 Essential (primary) hypertension: Secondary | ICD-10-CM | POA: Insufficient documentation

## 2013-09-23 DIAGNOSIS — D126 Benign neoplasm of colon, unspecified: Secondary | ICD-10-CM | POA: Insufficient documentation

## 2013-09-23 DIAGNOSIS — J4489 Other specified chronic obstructive pulmonary disease: Secondary | ICD-10-CM | POA: Insufficient documentation

## 2013-09-23 DIAGNOSIS — Z951 Presence of aortocoronary bypass graft: Secondary | ICD-10-CM | POA: Insufficient documentation

## 2013-09-23 DIAGNOSIS — J392 Other diseases of pharynx: Secondary | ICD-10-CM | POA: Insufficient documentation

## 2013-09-23 DIAGNOSIS — K449 Diaphragmatic hernia without obstruction or gangrene: Secondary | ICD-10-CM | POA: Insufficient documentation

## 2013-09-23 DIAGNOSIS — D371 Neoplasm of uncertain behavior of stomach: Secondary | ICD-10-CM | POA: Insufficient documentation

## 2013-09-23 DIAGNOSIS — J449 Chronic obstructive pulmonary disease, unspecified: Secondary | ICD-10-CM | POA: Insufficient documentation

## 2013-09-23 DIAGNOSIS — K219 Gastro-esophageal reflux disease without esophagitis: Secondary | ICD-10-CM | POA: Insufficient documentation

## 2013-09-23 DIAGNOSIS — Z7982 Long term (current) use of aspirin: Secondary | ICD-10-CM | POA: Insufficient documentation

## 2013-09-23 DIAGNOSIS — K648 Other hemorrhoids: Secondary | ICD-10-CM | POA: Insufficient documentation

## 2013-09-23 DIAGNOSIS — R195 Other fecal abnormalities: Secondary | ICD-10-CM | POA: Insufficient documentation

## 2013-09-23 DIAGNOSIS — I252 Old myocardial infarction: Secondary | ICD-10-CM | POA: Insufficient documentation

## 2013-09-23 HISTORY — PX: ESOPHAGOGASTRODUODENOSCOPY (EGD) WITH PROPOFOL: SHX5813

## 2013-09-23 HISTORY — PX: COLONOSCOPY WITH PROPOFOL: SHX5780

## 2013-09-23 SURGERY — ESOPHAGOGASTRODUODENOSCOPY (EGD) WITH PROPOFOL
Anesthesia: Monitor Anesthesia Care

## 2013-09-23 MED ORDER — LACTATED RINGERS IV SOLN
INTRAVENOUS | Status: DC | PRN
  Administered 2013-09-23: 10:00:00 via INTRAVENOUS

## 2013-09-23 MED ORDER — LACTATED RINGERS IV SOLN: INTRAVENOUS | Status: DC

## 2013-09-23 MED ORDER — PROPOFOL 10 MG/ML IV BOLUS
INTRAVENOUS | Status: AC
  Filled 2013-09-23: qty 20

## 2013-09-23 MED ORDER — SODIUM CHLORIDE 0.9 % IV SOLN: INTRAVENOUS | Status: DC

## 2013-09-23 MED ORDER — FENTANYL CITRATE 0.05 MG/ML IJ SOLN: 25.0000 ug | INTRAMUSCULAR | Status: DC | PRN

## 2013-09-23 MED ORDER — FENTANYL CITRATE 0.05 MG/ML IJ SOLN
INTRAMUSCULAR | Status: AC
  Filled 2013-09-23: qty 2

## 2013-09-23 MED ORDER — LACTATED RINGERS IV SOLN
INTRAVENOUS | Status: DC
  Administered 2013-09-23: 1000 mL via INTRAVENOUS

## 2013-09-23 MED ORDER — MIDAZOLAM HCL 5 MG/5ML IJ SOLN
INTRAMUSCULAR | Status: DC | PRN
  Administered 2013-09-23: 0.5 mg via INTRAVENOUS
  Administered 2013-09-23: 1 mg via INTRAVENOUS
  Administered 2013-09-23 (×2): .25 mg via INTRAVENOUS

## 2013-09-23 MED ORDER — ONDANSETRON HCL 4 MG/2ML IJ SOLN
INTRAMUSCULAR | Status: DC | PRN
  Administered 2013-09-23 (×2): 2 mg via INTRAVENOUS

## 2013-09-23 MED ORDER — PROPOFOL INFUSION 10 MG/ML OPTIME
INTRAVENOUS | Status: DC | PRN
  Administered 2013-09-23: 100 ug/kg/min via INTRAVENOUS

## 2013-09-23 MED ORDER — MIDAZOLAM HCL 2 MG/2ML IJ SOLN
INTRAMUSCULAR | Status: AC
  Filled 2013-09-23: qty 2

## 2013-09-23 MED ORDER — ONDANSETRON HCL 4 MG/2ML IJ SOLN
INTRAMUSCULAR | Status: AC
  Filled 2013-09-23: qty 2

## 2013-09-23 MED ORDER — FENTANYL CITRATE 0.05 MG/ML IJ SOLN
INTRAMUSCULAR | Status: DC | PRN
  Administered 2013-09-23: 25 ug via INTRAVENOUS
  Administered 2013-09-23: 50 ug via INTRAVENOUS
  Administered 2013-09-23: 25 ug via INTRAVENOUS

## 2013-09-23 MED ORDER — EPHEDRINE SULFATE 50 MG/ML IJ SOLN
INTRAMUSCULAR | Status: AC
  Filled 2013-09-23: qty 1

## 2013-09-23 MED ORDER — KETAMINE HCL 10 MG/ML IJ SOLN
INTRAMUSCULAR | Status: DC | PRN
  Administered 2013-09-23: 15 mg via INTRAVENOUS

## 2013-09-23 SURGICAL SUPPLY — 24 items

## 2013-09-23 NOTE — Op Note (Signed)
[facility] [address] [place], [address]   ENDOSCOPY PROCEDURE REPORT  PATIENT: William, Lee  MR#: [id number] BIRTHDATE: September 24, 1946 , 67  yrs. old GENDER: Male ENDOSCOPIST:Audi Wettstein Randa Evens, MD REFERRED BY:  Dr. 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 The stomach was entered pylorus identified and pass. The duodenum including the 2nd portion duodenal bulb were normal. Pyloric channel and antrum were normal. Stomach examined in the forward and retroflex view no ulceration or other lesions were seen. The GE junction reveals a small hiatal hernia with no other gross abnormalities. The esophagus was endoscopically normal. The scope was withdrawn back up into the pharynx in the vocal cords appeared somewhat edematous probably from previous papillomatosis treatment. The scope was withdrawn the patient tolerated THE PROCEDURE WELL>     COMPLICATIONS: None  ENDOSCOPIC IMPRESSION: 1. Positive stool. No obvious cause on EGD 2. 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 Windle Guard

## 2013-09-23 NOTE — Transfer of Care (Signed)
Immediate Anesthesia Transfer of Care Note  Patient: William Lee  Procedure(s) Performed: Procedure(s): ESOPHAGOGASTRODUODENOSCOPY (EGD) WITH PROPOFOL (N/A) COLONOSCOPY WITH PROPOFOL (N/A)  Patient Location: PACU  Anesthesia Type:MAC  Level of Consciousness: awake, alert , oriented and patient cooperative  Airway & Oxygen Therapy: Patient Spontanous Breathing and Patient connected to nasal cannula oxygen  Post-op Assessment: Report given to PACU RN, Post -op Vital signs reviewed and stable and Patient moving all extremities  Post vital signs: stable  Complications: No apparent anesthesia complications

## 2013-09-23 NOTE — Anesthesia Postprocedure Evaluation (Signed)
  Anesthesia Post-op Note  Patient: William Lee  Procedure(s) Performed: Procedure(s) (LRB): ESOPHAGOGASTRODUODENOSCOPY (EGD) WITH PROPOFOL (N/A) COLONOSCOPY WITH PROPOFOL (N/A)  Patient Location: PACU  Anesthesia Type: MAC  Level of Consciousness: awake and alert   Airway and Oxygen Therapy: Patient Spontanous Breathing  Post-op Pain: mild  Post-op Assessment: Post-op Vital signs reviewed, Patient's Cardiovascular Status Stable, Respiratory Function Stable, Patent Airway and No signs of Nausea or vomiting  Last Vitals:  Filed Vitals:   09/23/13 1152  BP: 120/81  Pulse: 73  Temp:   Resp: 18    Post-op Vital Signs: stable   Complications: No apparent anesthesia complications

## 2013-09-23 NOTE — H&P (Signed)
Subjective:   Patient is a 67 y.o. male presents with Hemoccult positive stool. He does have a history of ulcer disease. More concerning his he has a history of papilloma virus causing encroachment around his vocal cords and has been undergoing treatment for this with ENT physician at [facility]. Reviewed the records shows that he was examined 8/14 with his last treatment being 8/13 with no change in size of the papilloma. Due to difficulties with previous sedation in his history of laryngeal papilloma, is to have procedures done under propofol sedation with anesthesia monitoring his airway.. Procedure including risks and benefits discussed in office.  Patient Active Problem List   Diagnosis Date Noted  . Community acquired pneumonia 11/12/2011  . Shortness of breath 11/11/2011  . Ischemic heart disease   . MI (myocardial infarction)   . COPD, severe   . Papilloma of larynx   . Chest pain   . Dyslipidemia    Past Medical History  Diagnosis Date  . Ischemic heart disease   . MI (myocardial infarction)   . COPD, severe   . Papilloma of larynx   . Chest pain   . SOB (shortness of breath) on exertion   . Dyslipidemia   . COPD (chronic obstructive pulmonary disease)   . Hyperlipidemia   . Hypertension   . GERD (gastroesophageal reflux disease)   . Asthma   . PONV (postoperative nausea and vomiting)   . left Inguinal hernia 02/01/2012    Repaired 05/01/12   . 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 Coronary artery bypass graft    . Cholecystectomy  12/1995  . Papalomas virus removal  3/10 7/10 5/11 10/12  . Inguinal hernia repair  05/01/2012    Procedure: HERNIA REPAIR INGUINAL ADULT;  Surgeon: Currie Paris, MD;  Location:  SUR[facility]ERY CENTER;  Service: General;  Laterality: Left;  inguinal  . Hernia repair      Prescriptions prior to admission  Medication Sig Dispense Refill  . aspirin 81 MG  tablet Take 81 mg by mouth daily.      Marland Kitchen atorvastatin (LIPITOR) 20 MG tablet Take 20 mg by mouth daily.      . budesonide-formoterol (SYMBICORT) 160-4.5 MCG/ACT inhaler Inhale 2 puffs into the lungs 2 (two) times daily.      . cyclobenzaprine (FLEXERIL) 10 MG tablet Take 10 mg by mouth 2 (two) times daily as needed for muscle spasms.       Marland Kitchen diltiazem (CARDIZEM CD) 120 MG 24 hr capsule Take 120 mg by mouth at bedtime.      Marland Kitchen omeprazole (PRILOSEC) 20 MG capsule Take 40 mg by mouth 2 (two) times daily. Take one capsule in the morning and 2 capsules in the evening      . potassium chloride (KLOR-CON) 20 MEQ packet Take 20 mEq by mouth 2 (two) times daily.       . Tiotropium Bromide Monohydrate (SPIRIVA HANDIHALER IN) Inhale 1 capsule into the lungs daily.       . traMADol (ULTRAM) 50 MG tablet Take 25 mg by mouth every 6 (six) hours as needed for pain.       Marland Kitchen triamterene-hydrochlorothiazide (MAXZIDE-25) 37.5-25 MG per tablet Take 1 tablet by mouth daily.       Marland Kitchen albuterol (PROVENTIL HFA;VENTOLIN HFA) 108 (90 BASE) MCG/ACT inhaler Inhale 2 puffs into the lungs every 6 (six) hours as needed  for wheezing.      Marland Kitchen albuterol (PROVENTIL) (5 MG/ML) 0.5% nebulizer solution Take 2.5 mg by nebulization every 6 (six) hours as needed for wheezing or shortness of breath.       . fluticasone (FLONASE) 50 MCG/ACT nasal spray Place 2 sprays into the nose daily as needed for rhinitis.        No Known Allergies  History  Substance Use Topics  . Smoking status: Former Smoker    Quit date: 10/30/1990  . Smokeless tobacco: Not on file  . Alcohol Use: No    Family History  Problem Relation Age of Onset  . Cancer Mother     Liver  . Cancer Father     Bone  . Cancer Sister     lung  . Cancer Brother     unknown     Objective:   Patient Vitals for the past 8 hrs:  BP Temp Temp src Pulse Resp SpO2 Height Weight  09/23/13 0922 140/81 mmHg 98.4 F (36.9 C) Oral 85 16 96 % 5\' 2"  (1.575 m) 58.06 kg (128 lb)          See MD Preop evaluation      Assessment:   1. Heme positive stool. 2. 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

## 2013-09-23 NOTE — Anesthesia Preprocedure Evaluation (Signed)
Anesthesia Evaluation  Patient identified by MRN, date of birth, ID band Patient awake    Reviewed: Allergy & Precautions, H&P , NPO status , Patient's Chart, lab work & pertinent test results  History of Anesthesia Complications (+) PONV  Airway Mallampati: II TM Distance: >3 FB Neck ROM: full    Dental no notable dental hx. 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 on medications + Past MI and + CABG negative cardio ROS  Rhythm:regular Rate:Normal     Neuro/Psych negative neurological ROS  negative psych ROS   GI/Hepatic negative GI ROS, Neg liver ROS, GERD-  Medicated and Controlled,  Endo/Other  negative endocrine ROS  Renal/GU negative Renal ROS  negative genitourinary   Musculoskeletal   Abdominal   Peds  Hematology negative hematology ROS (+)   Anesthesia Other Findings   Reproductive/Obstetrics negative OB ROS                           Anesthesia Physical Anesthesia Plan  ASA: III  Anesthesia Plan: MAC   Post-op Pain Management:    Induction:   Airway Management Planned: Simple Face Mask  Additional Equipment:   Intra-op Plan:   Post-operative Plan:   Informed Consent: I have reviewed the patients History and Physical, chart, labs and discussed the procedure including the risks, benefits and alternatives for the proposed anesthesia with the patient or authorized representative who has indicated his/her understanding and acceptance.   Dental Advisory Given  Plan Discussed with: CRNA and Surgeon  Anesthesia Plan Comments:         Anesthesia Quick Evaluation

## 2013-09-23 NOTE — Op Note (Signed)
[facility] [address] [place], [address]   COLONOSCOPY PROCEDURE REPORT  PATIENT: William Lee, William Lee  MR#: [id number] BIRTHDATE: Apr 09, 1946 , 67  yrs. old GENDER: Male ENDOSCOPIST: Carman Ching, MD REFERRED BY: Dr. Windle Guard PROCEDURE DATE:  09/23/2013 PROCEDURE:  Colonoscopy and Polypectomy ASA CLASS:    class III INDICATIONS:  heme positive stools MEDICATIONS:    MAC with propofol  DESCRIPTION OF PROCEDURE: Following EGD, digital rectal exam performed in the Pentax pediatric colonoscope was used. The scope was advanced under direct visualization and the prep was good. We advanced to the cecum using gentle abdominal pressure. ICV and appendiceal orifice were identified. 1/2 cm sessile polyp and proximal ascending colon removed with snare and recovered. 1 1/2 to 2 cm sessile polyp in proximal tranverse colon was removed in 3 pieces in the pieces were recovered with the net. Scope reinserted in the proximal sigmoid three-quarter centimeters sessile polyp found and removed with a hot snare. This was recovered. Scattered diverticulosis to have to sigmoid and leftcolon. No other lesions were seen upon withdrawal the scope. Internal hemorrhoids were seen in the rectum were not actively bleeding. The patient tolerated procedure well and was monitored by anesthesia throughout. There were no immediate complications.     COMPLICATIONS: None  ENDOSCOPIC IMPRESSION: 1. Multiple colon polyps removed from a sending, transverse, and the sigmoid colon. 2. Internal hemorrhoids 3. Mild diverticulosis  RECOMMENDATIONS: routine post polypectomy instructions. Repeat colonoscopy depending on path. We'll see back in the office in 6 months to recheck his stool.    _______________________________ eSigned:  Carman Ching, MD 09/23/2013 11:44 AM  CC: Dr. Jeannetta Nap    PATIENT NAME:  William Lee, William Lee MR#: [id number]

## 2013-09-24 ENCOUNTER — Encounter (HOSPITAL_COMMUNITY): Payer: Self-pay | Admitting: Gastroenterology

## 2014-07-22 ENCOUNTER — Ambulatory Visit (INDEPENDENT_AMBULATORY_CARE_PROVIDER_SITE_OTHER): Payer: Medicare Other | Admitting: Cardiology

## 2014-07-22 ENCOUNTER — Encounter: Payer: Self-pay | Admitting: Cardiology

## 2014-07-22 VITALS — BP 140/80 | HR 86 | Ht 63.0 in | Wt 119.0 lb

## 2014-07-22 DIAGNOSIS — R0602 Shortness of breath: Secondary | ICD-10-CM

## 2014-07-22 DIAGNOSIS — I259 Chronic ischemic heart disease, unspecified: Secondary | ICD-10-CM

## 2014-07-22 DIAGNOSIS — E785 Hyperlipidemia, unspecified: Secondary | ICD-10-CM

## 2014-07-22 LAB — HEPATIC FUNCTION PANEL
ALT: 15 U/L (ref 0–53)
AST: 21 U/L (ref 0–37)
Albumin: 4.1 g/dL (ref 3.5–5.2)
Alkaline Phosphatase: 59 U/L (ref 39–117)
Bilirubin, Direct: 0.3 mg/dL (ref 0.0–0.3)
Total Bilirubin: 2.5 mg/dL — ABNORMAL HIGH (ref 0.2–1.2)
Total Protein: 7.3 g/dL (ref 6.0–8.3)

## 2014-07-22 LAB — BASIC METABOLIC PANEL
BUN: 15 mg/dL (ref 6–23)
CO2: 29 mEq/L (ref 19–32)
Calcium: 9.1 mg/dL (ref 8.4–10.5)
Chloride: 99 mEq/L (ref 96–112)
Creatinine, Ser: 1.2 mg/dL (ref 0.4–1.5)
GFR: 64.98 mL/min (ref >60.00)
Glucose, Bld: 108 mg/dL — ABNORMAL HIGH (ref 70–99)
Potassium: 3.2 mEq/L — ABNORMAL LOW (ref 3.5–5.1)
Sodium: 135 mEq/L (ref 135–145)

## 2014-07-22 LAB — LIPID PANEL
Cholesterol: 161 mg/dL (ref 0–200)
HDL: 50.1 mg/dL (ref >39.00)
LDL Cholesterol: 87 mg/dL (ref 0–99)
NonHDL: 110.9
Total CHOL/HDL Ratio: 3
Triglycerides: 119 mg/dL (ref 0.0–149.0)
VLDL: 23.8 mg/dL (ref 0.0–40.0)

## 2014-07-22 NOTE — Assessment & Plan Note (Signed)
The patient has not been experiencing any recurrent chest pain or angina.  No sublingual nitroglycerin.

## 2014-07-22 NOTE — Assessment & Plan Note (Signed)
The patient is on Lipitor 20 mg daily.  He is not having any myalgias.  Fasting blood work today is pending.

## 2014-07-22 NOTE — Progress Notes (Signed)
William Lee Date of Birth:  [birth year] [facility] [address] [place], [place]  [address] [phone]  Fax   [phone]  HPI: This pleasant 68 year old gentleman is seen for a scheduled 6 month followup office visit. The patient has a history of known ischemic heart disease. He had an acute inferior wall myocardial infarction in August 1996. He underwent subsequent coronary artery bypass graft surgery. Patient has a history of severe COPD. Patient has known left ventricular dysfunction with previous ejection fraction by echocardiogram showing an ejection fraction of 30-35% with significant inferoposterior wall wall akinesis. The patient also has known mild to moderate mitral regurgitation by echo. The patient has a history of hyperlipidemia and is on Lipitor. 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 His last surgery was in April 2015.  He is followed for this by his specialist in [place].  He gets other care at the [facility].  He recently received a Prevnar 13 shot.  He has been evaluated for liver disease.  He has a history of Gilbert's disease.   Current Outpatient Prescriptions  Medication Sig Dispense Refill  . albuterol (PROVENTIL HFA;VENTOLIN HFA) 108 (90 BASE) MCG/ACT inhaler Inhale 2 puffs into the lungs every 6 (six) hours as needed for wheezing.      Marland Kitchen albuterol (PROVENTIL) (5 MG/ML) 0.5% nebulizer solution Take 2.5 mg by nebulization every 6 (six) hours as needed for wheezing or shortness of breath.       Marland Kitchen aspirin 81 MG tablet Take 81 mg by mouth daily.      Marland Kitchen atorvastatin (LIPITOR) 20 MG tablet Take 20 mg by mouth daily.      . budesonide-formoterol (SYMBICORT) 160-4.5 MCG/ACT inhaler Inhale 2 puffs into the lungs 2 (two) times daily.      . cyclobenzaprine (FLEXERIL) 10 MG tablet Take 10 mg by mouth 2 (two) times daily as needed for muscle spasms.       Marland Kitchen diltiazem (CARDIZEM CD) 120 MG 24 hr capsule Take 120  mg by mouth at bedtime.      . fluticasone (FLONASE) 50 MCG/ACT nasal spray Place 2 sprays into the nose daily as needed for rhinitis.       Marland Kitchen omeprazole (PRILOSEC) 20 MG capsule Take 40 mg by mouth 2 (two) times daily. Take one capsule in the morning and 2 capsules in the evening      . potassium chloride (KLOR-CON) 20 MEQ packet Take 20 mEq by mouth 2 (two) times daily.       . Tiotropium Bromide Monohydrate (SPIRIVA HANDIHALER IN) Inhale 1 capsule into the lungs daily.       . traMADol (ULTRAM) 50 MG tablet Take 25 mg by mouth every 6 (six) hours as needed for pain.       Marland Kitchen triamterene-hydrochlorothiazide (MAXZIDE-25) 37.5-25 MG per tablet Take 1 tablet by mouth daily.        No current facility-administered medications for this visit.    Allergies  Allergen Reactions  . Tape Rash    Patient Active Problem List   Diagnosis Date Noted  . Dyslipidemia     Priority: High  . Community acquired pneumonia 11/12/2011  . Shortness of breath 11/11/2011  . Ischemic heart disease   . MI (myocardial infarction)   . COPD, severe   . Papilloma of larynx   . Chest pain     History  Smoking status  . Former Smoker  .  Quit date: 10/30/1990  Smokeless tobacco  . Not on file    History  Alcohol Use No    Family History  Problem Relation Age of Onset  . Cancer Mother     Liver  . Cancer Father     Bone  . Cancer Sister     lung  . Cancer Brother     unknown    Review of Systems: The patient denies any heat or cold intolerance.  No weight gain or weight loss.  The patient denies headaches or blurry vision.  There is no cough or sputum production.  The patient denies dizziness.  There is no hematuria or hematochezia.  The patient denies any muscle aches or arthritis.  The patient denies any rash.  The patient denies frequent falling or instability.  There is no history of depression or anxiety.  All other systems were reviewed and are negative.   Physical Exam: Filed Vitals:    07/22/14 1127  BP: 140/80  Pulse: 86  The patient appears to be in no distress.  He does have a prominent nonproductive cough  Head and neck exam reveals that the pupils are equal and reactive.  The extraocular movements are full.  There is no scleral icterus.  Mouth and pharynx are benign.  No lymphadenopathy.  No carotid bruits.  The jugular venous pressure is normal.  Thyroid is not enlarged or tender.  The voice is hoarse.  Chest is clear to percussion and auscultation.  No rales or rhonchi.  Expansion of the chest is symmetrical.  Heart reveals no abnormal lift or heave.  First and second heart sounds are normal.  There is no murmur gallop rub or click.  The abdomen is soft and nontender.  Bowel sounds are normoactive.  There is no hepatosplenomegaly or mass.  There are no abdominal bruits.  Extremities reveal no phlebitis or edema.  Pedal pulses are good.  There is no cyanosis or clubbing.  Neurologic exam is normal strength and no lateralizing weakness.  No sensory deficits.  Integument reveals no rash   EKG shows normal sinus rhythm with frequent PVCs.  There is a pattern of an old inferior wall myocardial infarction.  Since last tracing 09/23/13, no significant change.  Assessment / Plan: 1.  Ischemic heart disease status post inferior wall myocardial infarction August 1996 2. left ventricular dysfunction with inferoposterior wall akinesis and overall ejection fraction of 30-35% 3. mild to moderate mitral regurgitation by echo. 4.  Severe COPD 5. 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  Hypercholesterolemia  Disposition: Continue current medication.  Laboratory pending.  Recheck in 6 months for followup office visit and fasting lab work

## 2014-07-22 NOTE — Progress Notes (Signed)
Quick Note:  Please report to patient. The recent labs are stable. Continue same medication and careful diet. The potassium is low at 3.2. Increase potassium up to 3 times a day. ______

## 2014-07-22 NOTE — Patient Instructions (Signed)
Will obtain labs today and call you with the results (lp/bmet/hfp)  Your physician recommends that you continue on your current medications as directed. Please refer to the Current Medication list given to you today.  Your physician wants you to follow-up in: 6 months with fasting labs (lp/bmet/hfp)  You will receive a reminder letter in the mail two months in advance. If you don't receive a letter, please call our office to schedule the follow-up appointment.  

## 2014-07-22 NOTE — Assessment & Plan Note (Signed)
Patient has severe COPD.  He has a chronic loose cough.  No fever or chills or purulent sputum at this time.

## 2014-07-23 ENCOUNTER — Telehealth: Payer: Self-pay | Admitting: *Deleted

## 2014-07-23 NOTE — Telephone Encounter (Signed)
Advised patient of lab results. Patient had only been taking K+ once a day. Discussed with  Dr. Mare Ferrari and will increase to twice a day chart stated. Patient verbalized understanding. Mailed copy of labs so he could give to [place]

## 2014-07-23 NOTE — Telephone Encounter (Signed)
Message copied by Earvin Hansen on Thu Jul 23, 2014  5:50 PM ------      Message from: Darlin Coco      Created: Wed Jul 22, 2014  5:42 PM       Please report to patient.  The recent labs are stable. Continue same medication and careful diet.  The potassium is low at 3.2.  Increase potassium up to 3 times a day. ------

## 2014-07-30 ENCOUNTER — Telehealth: Payer: Self-pay | Admitting: Cardiology

## 2014-07-30 MED ORDER — POTASSIUM CHLORIDE 20 MEQ PO PACK
20.0000 meq | PACK | Freq: Two times a day (BID) | ORAL | Status: DC
Start: 2014-07-30 — End: 2015-10-22

## 2014-07-30 NOTE — Telephone Encounter (Signed)
Faxed office note and labs with Rx change for potassium to [place] as requested

## 2014-07-30 NOTE — Telephone Encounter (Signed)
New message    Patient calling need information fax over to [place]  Fax [phone]

## 2015-01-20 ENCOUNTER — Encounter: Payer: Self-pay | Admitting: *Deleted

## 2015-01-21 ENCOUNTER — Ambulatory Visit (INDEPENDENT_AMBULATORY_CARE_PROVIDER_SITE_OTHER): Payer: Medicare Other | Admitting: Cardiology

## 2015-01-21 ENCOUNTER — Encounter: Payer: Self-pay | Admitting: Cardiology

## 2015-01-21 VITALS — BP 102/64 | HR 67 | Ht 63.0 in | Wt 124.4 lb

## 2015-01-21 DIAGNOSIS — E785 Hyperlipidemia, unspecified: Secondary | ICD-10-CM

## 2015-01-21 DIAGNOSIS — I259 Chronic ischemic heart disease, unspecified: Secondary | ICD-10-CM

## 2015-01-21 DIAGNOSIS — R0602 Shortness of breath: Secondary | ICD-10-CM | POA: Diagnosis not present

## 2015-01-21 LAB — LIPID PANEL
Cholesterol: 159 mg/dL (ref 0–200)
HDL: 49.1 mg/dL (ref >39.00)
LDL Cholesterol: 79 mg/dL (ref 0–99)
NonHDL: 109.9
Total CHOL/HDL Ratio: 3
Triglycerides: 153 mg/dL — ABNORMAL HIGH (ref 0.0–149.0)
VLDL: 30.6 mg/dL (ref 0.0–40.0)

## 2015-01-21 LAB — HEPATIC FUNCTION PANEL
ALT: 11 U/L (ref 0–53)
AST: 16 U/L (ref 0–37)
Albumin: 4 g/dL (ref 3.5–5.2)
Alkaline Phosphatase: 76 U/L (ref 39–117)
Bilirubin, Direct: 0.3 mg/dL (ref 0.0–0.3)
Total Bilirubin: 2.1 mg/dL — ABNORMAL HIGH (ref 0.2–1.2)
Total Protein: 7.1 g/dL (ref 6.0–8.3)

## 2015-01-21 LAB — BASIC METABOLIC PANEL
BUN: 14 mg/dL (ref 6–23)
CO2: 29 mEq/L (ref 19–32)
Calcium: 9.4 mg/dL (ref 8.4–10.5)
Chloride: 99 mEq/L (ref 96–112)
Creatinine, Ser: 1.2 mg/dL (ref 0.40–1.50)
GFR: 63.64 mL/min (ref >60.00)
Glucose, Bld: 117 mg/dL — ABNORMAL HIGH (ref 70–99)
Potassium: 3.8 mEq/L (ref 3.5–5.1)
Sodium: 136 mEq/L (ref 135–145)

## 2015-01-21 NOTE — Progress Notes (Signed)
Cardiology Office Note   Date:  01/21/2015   ID:  William Lee, DOB 1946-06-16, MRN [id number]  PCP:  Leonard Downing, MD  Cardiologist:   Darlin Coco, MD   No chief complaint on file.     History of Present Illness: William Lee is a 69 y.o. male who presents for a six-month follow-up visit  This pleasant 69 year old gentleman is seen for a scheduled 6 month followup office visit. The patient has a history of known ischemic heart disease. He had an acute inferior wall myocardial infarction in August 1996. He underwent subsequent coronary artery bypass graft surgery. Patient has a history of severe COPD. Patient has known left ventricular dysfunction with previous ejection fraction by echocardiogram showing an ejection fraction of 30-35% with significant inferoposterior wall wall akinesis. The patient also has known mild to moderate mitral regurgitation by echo. The patient has a history of hyperlipidemia and is on Lipitor. 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 His last surgery was 9 days ago. He is followed for this by his specialist in [place]. He gets other care at the [facility]. He recently received a Prevnar 13 shot. He has been evaluated for liver disease. He has a history of Gilbert's disease. Since last visit he has had some neuralgia of the left leg and has not been able to use his treadmill as much.  He is a former smoker.  He also has some degree of PAD  Past Medical History  Diagnosis Date  . Ischemic heart disease   . MI (myocardial infarction)   . COPD, severe   . Papilloma of larynx   . Chest pain   . SOB (shortness of breath) on exertion   . Dyslipidemia   . COPD (chronic obstructive pulmonary disease)   . Hyperlipidemia   . Hypertension   . GERD (gastroesophageal reflux disease)   . Asthma   . PONV (postoperative nausea and vomiting)   . left Inguinal hernia 02/01/2012    Repaired 05/01/12   . 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 Coronary artery bypass graft    . Cholecystectomy  12/1995  . Papalomas virus removal  3/10 7/10 5/11 10/12  . Inguinal hernia repair  05/01/2012    Procedure: HERNIA REPAIR INGUINAL ADULT;  Surgeon: Haywood Lasso, MD;  Location: [place];  Service: General;  Laterality: Left;  inguinal  . Hernia repair    . Esophagogastroduodenoscopy (egd) with propofol N/A 09/23/2013    Procedure: ESOPHAGOGASTRODUODENOSCOPY (EGD) WITH PROPOFOL;  Surgeon: Winfield Cunas., MD;  Location: [facility];  Service: Endoscopy;  Laterality: N/A;  . Colonoscopy with propofol N/A 09/23/2013    Procedure: COLONOSCOPY WITH PROPOFOL;  Surgeon: Winfield Cunas., MD;  Location: [facility];  Service: Endoscopy;  Laterality: N/A;     Current Outpatient Prescriptions  Medication Sig Dispense Refill  . albuterol (PROVENTIL HFA;VENTOLIN HFA) 108 (90 BASE) MCG/ACT inhaler Inhale 2 puffs into the lungs every 6 (six) hours as needed for wheezing.    Marland Kitchen albuterol (PROVENTIL) (5 MG/ML) 0.5% nebulizer solution Take 2.5 mg by nebulization every 6 (six) hours as needed for wheezing or shortness of breath.     Marland Kitchen aspirin 81 MG tablet Take 81 mg by mouth daily.    Marland Kitchen atorvastatin (LIPITOR) 20 MG tablet Take 20 mg  by mouth daily.    . budesonide-formoterol (SYMBICORT) 160-4.5 MCG/ACT inhaler Inhale 2 puffs into the lungs 2 (two) times daily.    . cyclobenzaprine (FLEXERIL) 10 MG tablet Take 10 mg by mouth 2 (two) times daily as needed for muscle spasms.     Marland Kitchen diltiazem (CARDIZEM CD) 120 MG 24 hr capsule Take 120 mg by mouth at bedtime.    . fluticasone (FLONASE) 50 MCG/ACT nasal spray Place 2 sprays into the nose daily as needed for rhinitis.     Marland Kitchen omeprazole (PRILOSEC) 20 MG capsule Take 40 mg by mouth 2 (two) times daily. Take one capsule in the morning and 2 capsules in the evening    .  potassium chloride (KLOR-CON) 20 MEQ packet Take 20 mEq by mouth 2 (two) times daily. 180 tablet 3  . Tiotropium Bromide Monohydrate (SPIRIVA HANDIHALER IN) Inhale 1 capsule into the lungs daily.     . traMADol (ULTRAM) 50 MG tablet Take 50 mg by mouth every 6 (six) hours as needed for pain.     Marland Kitchen triamterene-hydrochlorothiazide (MAXZIDE-25) 37.5-25 MG per tablet Take 1 tablet by mouth daily.      No current facility-administered medications for this visit.    Allergies:   Tape    Social History:  The patient  reports that he quit smoking about 24 years ago. He does not have any smokeless tobacco history on file. He reports that he does not drink alcohol or use illicit drugs.   Family History:  The patient's family history includes Bone cancer in his father; Cancer in his brother; Liver cancer in his mother; Lung cancer in his sister.    ROS:  Please see the history of present illness.   Otherwise, review of systems are positive for none.   All other systems are reviewed and negative.    PHYSICAL EXAM: VS:  BP 102/64 mmHg  Pulse 67  Ht 5\' 3"  (1.6 m)  Wt 124 lb 6.4 oz (56.427 kg)  BMI 22.04 kg/m2 , BMI Body mass index is 22.04 kg/(m^2). GEN: Well nourished, well developed, in no acute distress HEENT: normal Neck: no JVD, carotid bruits, or masses Cardiac: RRR; no murmurs, rubs, or gallops,no edema  Respiratory:  clear to auscultation bilaterally, normal work of breathing GI: soft, nontender, nondistended, + BS MS: no deformity or atrophy Skin: warm and dry, no rash Neuro:  Strength and sensation are intact Psych: euthymic mood, full affect   EKG:  EKG is not ordered today.    Recent Labs: 01/21/2015: ALT 11; BUN 14; Creatinine 1.20; Potassium 3.8; Sodium 136    Lipid Panel    Component Value Date/Time   CHOL 159 01/21/2015 1018   TRIG 153.0* 01/21/2015 1018   HDL 49.10 01/21/2015 1018   CHOLHDL 3 01/21/2015 1018   VLDL 30.6 01/21/2015 1018   LDLCALC 79 01/21/2015  1018      Wt Readings from Last 3 Encounters:  01/21/15 124 lb 6.4 oz (56.427 kg)  07/22/14 119 lb (53.978 kg)  09/23/13 128 lb (58.06 kg)       ASSESSMENT AND PLAN:  1. Ischemic heart disease status post inferior wall myocardial infarction August 1996 2. left ventricular dysfunction with inferoposterior wall akinesis and overall ejection fraction of 30-35% 3. mild to moderate mitral regurgitation by echo. 4. Severe COPD 5. 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 Hypercholesterolemia  Disposition: Continue current medication. Laboratory pending. Recheck in 6 months for followup office visit and  fasting lab work   Current medicines are reviewed at length with the patient today.  The patient does not have concerns regarding medicines.  The following changes have been made:  no change  Labs/ tests ordered today include:   Orders Placed This Encounter  Procedures  . Lipid panel  . Hepatic function panel  . Basic metabolic panel       Signed, Darlin Coco, MD  01/21/2015 7:28 PM    [facility] [address], [place], [place]  [address] Phone: [phone]; Fax: [phone]

## 2015-01-21 NOTE — Patient Instructions (Signed)
Will obtain labs today and call you with the results (lp/bmet/hfp)  Your physician recommends that you continue on your current medications as directed. Please refer to the Current Medication list given to you today.  Your physician wants you to follow-up in: 6 months with fasting labs (lp/bmet/hfp)  You will receive a reminder letter in the mail two months in advance. If you don't receive a letter, please call our office to schedule the follow-up appointment.  

## 2015-01-21 NOTE — Progress Notes (Signed)
Quick Note:  Please report to patient. The recent labs are stable. Continue same medication and careful diet. TG higher. Watch carbs. ______

## 2015-08-11 ENCOUNTER — Encounter: Payer: Self-pay | Admitting: Cardiology

## 2015-08-11 ENCOUNTER — Ambulatory Visit (INDEPENDENT_AMBULATORY_CARE_PROVIDER_SITE_OTHER): Payer: Medicare Other | Admitting: Cardiology

## 2015-08-11 VITALS — BP 138/60 | HR 73 | Ht 63.0 in | Wt 122.1 lb

## 2015-08-11 DIAGNOSIS — E785 Hyperlipidemia, unspecified: Secondary | ICD-10-CM | POA: Diagnosis not present

## 2015-08-11 DIAGNOSIS — I259 Chronic ischemic heart disease, unspecified: Secondary | ICD-10-CM | POA: Diagnosis not present

## 2015-08-11 DIAGNOSIS — R0602 Shortness of breath: Secondary | ICD-10-CM | POA: Diagnosis not present

## 2015-08-11 LAB — BASIC METABOLIC PANEL
BUN: 16 mg/dL (ref 7–25)
CO2: 27 mmol/L (ref 20–31)
Calcium: 9.4 mg/dL (ref 8.6–10.3)
Chloride: 97 mmol/L — ABNORMAL LOW (ref 98–110)
Creat: 1.09 mg/dL (ref 0.70–1.18)
Glucose, Bld: 131 mg/dL — ABNORMAL HIGH (ref 65–99)
Potassium: 4 mmol/L (ref 3.5–5.3)
Sodium: 135 mmol/L (ref 135–146)

## 2015-08-11 LAB — HEPATIC FUNCTION PANEL
ALT: 11 U/L (ref 9–46)
AST: 16 U/L (ref 10–35)
Albumin: 4.2 g/dL (ref 3.6–5.1)
Alkaline Phosphatase: 60 U/L (ref 40–115)
Bilirubin, Direct: 0.3 mg/dL — ABNORMAL HIGH (ref <=0.2)
Indirect Bilirubin: 1.3 mg/dL — ABNORMAL HIGH (ref 0.2–1.2)
Total Bilirubin: 1.6 mg/dL — ABNORMAL HIGH (ref 0.2–1.2)
Total Protein: 7.1 g/dL (ref 6.1–8.1)

## 2015-08-11 LAB — LIPID PANEL
Cholesterol: 165 mg/dL (ref 125–200)
HDL: 65 mg/dL (ref >=40)
LDL Cholesterol: 89 mg/dL (ref <130)
Total CHOL/HDL Ratio: 2.5 Ratio (ref <=5.0)
Triglycerides: 56 mg/dL (ref <150)
VLDL: 11 mg/dL (ref <30)

## 2015-08-11 NOTE — Progress Notes (Signed)
Quick Note:  Please report to patient. The recent labs are stable. Continue same medication and careful diet. BS is higher. Watch diet carefully. ______

## 2015-08-11 NOTE — Progress Notes (Signed)
Cardiology Office Note   Date:  08/11/2015   ID:  William Lee, DOB 10-25-1946, MRN [id number]  PCP:  Leonard Downing, MD  Cardiologist: Darlin Coco MD  No chief complaint on file.     History of Present Illness: William Lee is a 69 y.o. male who presents for a six-month follow-up visit  This pleasant 69 year old gentleman is seen for a scheduled 6 month followup office visit. The patient has a history of known ischemic heart disease. He had an acute inferior wall myocardial infarction in August 1996. He underwent subsequent coronary artery bypass graft surgery. Patient has a history of severe COPD. Patient has known left ventricular dysfunction with previous ejection fraction by echocardiogram showing an ejection fraction of 30-35% with significant inferoposterior wall wall akinesis. The patient also has known mild to moderate mitral regurgitation by echo. The patient has a history of hyperlipidemia and is on Lipitor. 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 His last surgery was yesterday. He is followed for this by his specialist in [place]. He gets other care at the [facility]. t. He has been evaluated for liver disease. He has a history of Gilbert's disease. Since last visit he has had some neuralgia of the left leg and has not been able to use his treadmill as much. He is a former smoker. He also has some degree of PAD. The patient has not been having any chest pain or angina.  Past Medical History  Diagnosis Date  . Ischemic heart disease   . MI (myocardial infarction) ([place])   . COPD, severe   . Papilloma of larynx   . Chest pain   . SOB (shortness of breath) on exertion   . Dyslipidemia   . COPD (chronic obstructive pulmonary disease) ([place])   . Hyperlipidemia   . Hypertension   . GERD (gastroesophageal reflux disease)   . Asthma   . PONV (postoperative nausea and vomiting)   . left Inguinal hernia 02/01/2012      Repaired 05/01/12   . 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 Coronary artery bypass graft    . Cholecystectomy  12/1995  . Papalomas virus removal  3/10 7/10 5/11 10/12  . Inguinal hernia repair  05/01/2012    Procedure: HERNIA REPAIR INGUINAL ADULT;  Surgeon: Haywood Lasso, MD;  Location: [place];  Service: General;  Laterality: Left;  inguinal  . Hernia repair    . Esophagogastroduodenoscopy (egd) with propofol N/A 09/23/2013    Procedure: ESOPHAGOGASTRODUODENOSCOPY (EGD) WITH PROPOFOL;  Surgeon: Winfield Cunas., MD;  Location: [facility];  Service: Endoscopy;  Laterality: N/A;  . Colonoscopy with propofol N/A 09/23/2013    Procedure: COLONOSCOPY WITH PROPOFOL;  Surgeon: Winfield Cunas., MD;  Location: [facility];  Service: Endoscopy;  Laterality: N/A;     Current Outpatient Prescriptions  Medication Sig Dispense Refill  . albuterol (PROVENTIL HFA;VENTOLIN HFA) 108 (90 BASE) MCG/ACT inhaler Inhale 2 puffs into the lungs every 6 (six) hours as needed for wheezing.    Marland Kitchen albuterol (PROVENTIL) (5 MG/ML) 0.5% nebulizer solution Take 2.5 mg by nebulization every 6 (six) hours as needed for wheezing or shortness of breath.     Marland Kitchen aspirin 81 MG tablet Take 81 mg by mouth daily.    Marland Kitchen atorvastatin (LIPITOR) 40 MG tablet Take 20  mg by mouth daily as needed.    . budesonide-formoterol (SYMBICORT) 160-4.5 MCG/ACT inhaler Inhale 2 puffs into the lungs 2 (two) times daily.    . chlorpheniramine-HYDROcodone (TUSSIONEX) 10-8 MG/5ML SUER Take 5 mLs by mouth daily as needed (cough).     . cyclobenzaprine (FLEXERIL) 10 MG tablet Take 10 mg by mouth 2 (two) times daily as needed for muscle spasms.     Marland Kitchen diltiazem (CARDIZEM CD) 120 MG 24 hr capsule Take 120 mg by mouth at bedtime.    . fluticasone (FLONASE) 50 MCG/ACT nasal spray Place 2 sprays into the nose daily as needed for rhinitis.      Marland Kitchen omeprazole (PRILOSEC) 20 MG capsule Take 40 mg by mouth 2 (two) times daily. Take one capsule in the morning and 2 capsules in the evening    . oxyCODONE (OXY IR/ROXICODONE) 5 MG immediate release tablet Take 1 tablet by mouth every 4 (four) hours as needed (pain).     . potassium chloride (KLOR-CON) 20 MEQ packet Take 20 mEq by mouth 2 (two) times daily. 180 tablet 3  . Tiotropium Bromide Monohydrate (SPIRIVA HANDIHALER IN) Inhale 1 capsule into the lungs daily.     . traMADol (ULTRAM) 50 MG tablet Take 50 mg by mouth every 6 (six) hours as needed for pain.     Marland Kitchen triamterene-hydrochlorothiazide (MAXZIDE-25) 37.5-25 MG per tablet Take 1 tablet by mouth daily.      No current facility-administered medications for this visit.    Allergies:   Tape    Social History:  The patient  reports that he quit smoking about 24 years ago. He does not have any smokeless tobacco history on file. He reports that he does not drink alcohol or use illicit drugs.   Family History:  The patient's family history includes Bone cancer in his father; Cancer in his brother; Liver cancer in his mother; Lung cancer in his sister.    ROS:  Please see the history of present illness.   Otherwise, review of systems are positive for none.   All other systems are reviewed and negative.    PHYSICAL EXAM: VS:  BP 138/60 mmHg  Pulse 73  Ht 5\' 3"  (1.6 m)  Wt 122 lb 1.9 oz (55.393 kg)  BMI 21.64 kg/m2 , BMI Body mass index is 21.64 kg/(m^2). GEN: Well nourished, well developed, in no acute distress HEENT: normal Neck: no JVD, carotid bruits, or masses Cardiac: RRR; no murmurs, rubs, or gallops,no edema  Respiratory:  clear to auscultation bilaterally, normal work of breathing GI: soft, nontender, nondistended, + BS MS: no deformity or atrophy Skin: warm and dry, no rash Neuro:  Strength and sensation are intact Psych: euthymic mood, full affect   EKG:  EKG is ordered today. The ekg ordered today demonstrates  normal sinus rhythm at 73 bpm.  Old inferior wall MI with Q waves.  Since last tracing of 07/22/14, PVCs have resolved   Recent Labs: 01/21/2015: ALT 11; BUN 14; Creatinine, Ser 1.20; Potassium 3.8; Sodium 136    Lipid Panel    Component Value Date/Time   CHOL 159 01/21/2015 1018   TRIG 153.0* 01/21/2015 1018   HDL 49.10 01/21/2015 1018   CHOLHDL 3 01/21/2015 1018   VLDL 30.6 01/21/2015 1018   LDLCALC 79 01/21/2015 1018      Wt Readings from Last 3 Encounters:  08/11/15 122 lb 1.9 oz (55.393 kg)  01/21/15 124 lb 6.4 oz (56.427 kg)  07/22/14 119 lb (53.978 kg)  ASSESSMENT AND PLAN:  1. Ischemic heart disease status post inferior wall myocardial infarction August 1996 2. left ventricular dysfunction with inferoposterior wall akinesis and overall ejection fraction of 30-35% 3. mild to moderate mitral regurgitation by echo. 4. Severe COPD 5. 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 Hypercholesterolemia   Current medicines are reviewed at length with the patient today.  The patient does not have concerns regarding medicines.  The following changes have been made:  no change  Labs/ tests ordered today include:   Orders Placed This Encounter  Procedures  . Lipid panel  . Hepatic function panel  . Basic metabolic panel  . EKG 12-Lead    Disposition: Continue on same medication.  We are checking blood work today.  Recheck in 6 months for follow-up office visit lipid panel hepatic function panel and basal metabolic panel.  Initial follow-up will probably be with APP.   Berna Spare MD 08/11/2015 1:04 PM    [address], [place], [place]  [address] Phone: [phone]; Fax: [phone]

## 2015-08-11 NOTE — Patient Instructions (Signed)
Medication Instructions:  Your physician recommends that you continue on your current medications as directed. Please refer to the Current Medication list given to you today.  Labwork: Lp/bmet/hfp  Testing/Procedures: nne  Follow-Up: Your physician recommends that you schedule a follow-up appointment in: 6 month ov/lp/bmet/hfp with Tera Helper NP or Brynda Rim PA

## 2015-10-22 ENCOUNTER — Ambulatory Visit (INDEPENDENT_AMBULATORY_CARE_PROVIDER_SITE_OTHER): Payer: Medicare Other | Admitting: Internal Medicine

## 2015-10-22 ENCOUNTER — Ambulatory Visit (INDEPENDENT_AMBULATORY_CARE_PROVIDER_SITE_OTHER)
Admission: RE | Admit: 2015-10-22 | Discharge: 2015-10-22 | Disposition: A | Discharge status: Home / Self Care | Payer: Medicare Other | Source: Ambulatory Visit | Attending: Internal Medicine | Admitting: Internal Medicine

## 2015-10-22 ENCOUNTER — Encounter: Payer: Self-pay | Admitting: Internal Medicine

## 2015-10-22 VITALS — BP 118/78 | HR 73 | Ht 63.0 in | Wt 123.4 lb

## 2015-10-22 DIAGNOSIS — J449 Chronic obstructive pulmonary disease, unspecified: Secondary | ICD-10-CM | POA: Diagnosis not present

## 2015-10-22 DIAGNOSIS — I259 Chronic ischemic heart disease, unspecified: Secondary | ICD-10-CM | POA: Diagnosis not present

## 2015-10-22 MED ORDER — TIOTROPIUM BROMIDE MONOHYDRATE 2.5 MCG/ACT IN AERS: INHALATION_SPRAY | RESPIRATORY_TRACT | Status: DC

## 2015-10-22 NOTE — Progress Notes (Signed)
Subjective:    Patient ID: William Lee, male    DOB: 23-Dec-1945,  MRN: [id number]  HPI  47 yowm quit smoking 1992 with some breathing difficulty and some better cabg 1996 to point where could jog / work out on treadmill but required daily inhalers then around 2013 noted onset of doe shed to house up a landing on symbicort / spirva dpi  helping some self referred for sob/ chest congestion   10/22/2015 1st [facility] office visit/ [facility]   Chief Complaint  Patient presents with  . PULMONARY CONSULT    Pt is self-referred for cough/COPD. Pt states that he was diagnosed with COPD in 1996. Pt has been seen at the [place] several times but does not follow with a regular pulmonologist. Pt does have inhalers. Pt c/o cough, wheeze and SOB. Pt denies CP/tightness.   symptoms are chronic x 3 years s progression  and worse in am / better p symbiocrt/ taking spiriva powder form c/o throat and chest"congestion but not does not have excess/ purulent sputum or mucus plugs    Doe = MMRC2 = can't walk a nl pace on a flat grade s sob "to shed and back to house" with slt incline only   No obvious other patterns in day to day or daytime variabilty or assoc  cp or chest tightness,  overt sinus or hb symptoms. No unusual exp hx or h/o childhood pna/ asthma or knowledge of premature birth.  Sleeping ok without nocturnal  or early am exacerbation  of respiratory  c/o's or need for noct saba. Also denies any obvious fluctuation of symptoms with weather or environmental changes or other aggravating or alleviating factors except as outlined above   Current Medications, Allergies, Complete Past Medical History, Past Surgical History, Family History, and Social History were reviewed in [employer] record.            Review of Systems  Constitutional: Negative.  Negative for fever and unexpected weight change.  HENT: Positive for congestion and postnasal drip. Negative for dental problem,  ear pain, nosebleeds, rhinorrhea, sinus pressure, sneezing, sore throat and trouble swallowing.   Eyes: Negative.  Negative for redness and itching.  Respiratory: Positive for cough, shortness of breath and wheezing. Negative for chest tightness.   Cardiovascular: Positive for leg swelling. Negative for palpitations.  Gastrointestinal: Negative.  Negative for nausea and vomiting.  Endocrine: Negative.   Genitourinary: Negative.  Negative for dysuria.  Musculoskeletal: Positive for joint swelling.  Skin: Negative.  Negative for rash.  Allergic/Immunologic: Negative.   Neurological: Negative.  Negative for headaches.  Hematological: Bruises/bleeds easily.  Psychiatric/Behavioral: Negative.  Negative for dysphoric mood. The patient is not nervous/anxious.        Objective:   Physical Exam  amb extremely  hoarse wm nad/ very tremulous/ mild pseudowheeze    Wt Readings from Last 3 Encounters:  10/22/15 123 lb 6.4 oz (55.974 kg)  08/11/15 122 lb 1.9 oz (55.393 kg)  01/21/15 124 lb 6.4 oz (56.427 kg)    Vital signs reviewed   HEENT: nl dentition, turbinates, and oropharynx. Nl external ear canals without cough reflex   NECK :  without JVD/Nodes/TM/ nl carotid upstrokes bilaterally   LUNGS: no acc muscle use, barrel chest with distant bs, no wheeze    CV:  RRR  no s3 or murmur or increase in P2, no sign edema   ABD:  soft and nontender with  End insp hoover's  in the supine  position. No bruits or organomegaly, bowel sounds nl  MS:  Nl gait/ ext warm without deformities, calf tenderness, cyanosis or clubbing No obvious joint restrictions   SKIN: warm and dry without lesions    NEURO:  alert, approp, nl sensorium with  no motor deficits     CXR PA and Lateral:   10/22/2015 :    I personally reviewed images and agree with radiology impression as follows:     COPD. There is no evidence of pneumonia, CHF, nor other acute cardiopulmonary disease.              Assessment & Plan:

## 2015-10-22 NOTE — Patient Instructions (Addendum)
Change the  spiriva to 2 puffs each am right after your symbicort 160 x 2 pffs   Only use your albuterol as a rescue medication to be used if you can't catch your breath by resting or doing a relaxed purse lip breathing pattern.  - The less you use it, the better it will work when you need it. - Ok to use up to 2 puffs  every 4 hours if you must but call for immediate appointment if use goes up over your usual need - Don't leave home without it !!  (think of it like the spare tire for your car)   Please remember to go to the   x-ray department downstairs for your tests - we will call you with the results when they are available.  Take prilosec Take 30- 60 min before your first and last meals of the day and pepcid ac 20 mg one at bedtime  GERD (REFLUX)  is an extremely common cause of respiratory symptoms just like yours , many times with no obvious heartburn at all.    It can be treated with medication, but also with lifestyle changes including elevation of the head of your bed (ideally with 6 inch  bed blocks),  Smoking cessation, avoidance of late meals, excessive alcohol, and avoid fatty foods, chocolate, peppermint, colas, red wine, and acidic juices such as orange juice.  NO MINT OR MENTHOL PRODUCTS SO NO COUGH DROPS  USE SUGARLESS CANDY INSTEAD (Jolley ranchers or Stover's or Life Savers) or even ice chips will also do - the key is to swallow to prevent all throat clearing. NO OIL BASED VITAMINS - use powdered substitutes.  Please schedule a follow up office visit in 6 weeks, call sooner if needed with pfts  Review cardiac status next ov / check bnp

## 2015-10-23 ENCOUNTER — Encounter: Payer: Self-pay | Admitting: Internal Medicine

## 2015-10-23 NOTE — Assessment & Plan Note (Addendum)
10/22/2015  Walked RA x 3 laps @ 185 ft each stopped due to  End of study, nl pace, no sob or desat   - 10/22/2015  extensive coaching HFA effectiveness =    75% so try spiriva respimat  Symptoms are  disproportionate to objective findings and not clear this is a lung problem but pt does appear to have difficult airway management issues. DDX of  difficult airways management all start with A and  include Adherence, Ace Inhibitors, Acid Reflux, Active Sinus Disease, Alpha 1 Antitripsin deficiency, Anxiety masquerading as Airways dz,  ABPA,  allergy(esp in young), Aspiration (esp in elderly), Adverse effects of meds,  Active smokers,  Alternate dx, A bunch of PE's (a small clot burden can't cause this syndrome unless there is already severe underlying pulm or vascular dz with poor reserve) plus two Bs  = Bronchiectasis and Beta blocker use..and one C= CHF   Adherence is always the initial "prime suspect" and is a multilayered concern that requires a "trust but verify" approach in every patient - starting with knowing how to use medications, especially inhalers, correctly, keeping up with refills and understanding the fundamental difference between maintenance and prns vs those medications only taken for a very short course and then stopped and not refilled.  - - The proper method of use, as well as anticipated side effects, of a metered-dose inhaler are discussed and demonstrated to the patient. Improved effectiveness after extensive coaching during this visit to a level of approximately 75 % from a baseline of 50 %   ? Acid (or non-acid) GERD > always difficult to exclude as up to 75% of pts in some series report no assoc GI/ Heartburn symptoms> rec max (24h)  acid suppression and diet restrictions/ reviewed and instructions given in writing.   ? Adverse effects meds ?  dpi intol> try off and replace with respimat x 2 week trial   ? Anxiety/ depression/ deconditioning > usually a dx of exclusion but much  higher based on initial H and P   ? Alternate dx ? vcd from prev throat surgery > f/v loop with pfts ordered   ? chf > note EF 35% by Dr Sherryl Barters last eval but not recent bnp or echo > consider next ov p review pfts which I suspect will not show severe copd  I had an extended discussion with the patient and wife  reviewing all relevant studies completed to date and  lasting 67minutes of a 60 minute visit    Each maintenance medication was reviewed in detail including most importantly the difference between maintenance and prns and under what circumstances the prns are to be triggered using an action plan format that is not reflected in the computer generated alphabetically organized AVS.    Please see instructions for details which were reviewed in writing and the patient given a copy highlighting the part that I personally wrote and discussed at today's ov.

## 2015-12-06 ENCOUNTER — Ambulatory Visit: Payer: Medicare Other | Admitting: Internal Medicine

## 2015-12-27 ENCOUNTER — Telehealth (HOSPITAL_COMMUNITY): Payer: Self-pay

## 2015-12-27 NOTE — Telephone Encounter (Signed)
I have called and left a message with Madelyn to inquire about participation in Pulmonary Rehab per Dr. Suzie Portela referral. Requested patient call back to discuss program in greater detail

## 2016-01-14 ENCOUNTER — Ambulatory Visit (HOSPITAL_COMMUNITY): Payer: Medicare Other

## 2016-01-24 ENCOUNTER — Encounter (HOSPITAL_COMMUNITY)
Admission: RE | Admit: 2016-01-24 | Discharge: 2016-01-24 | Disposition: A | Discharge status: Home / Self Care | Payer: Medicare Other | Source: Ambulatory Visit | Attending: Pulmonary Disease | Admitting: Pulmonary Disease

## 2016-01-24 ENCOUNTER — Encounter (HOSPITAL_COMMUNITY): Payer: Self-pay

## 2016-01-24 VITALS — BP 148/76 | HR 77 | Resp 20 | Ht 62.25 in | Wt 121.9 lb

## 2016-01-24 DIAGNOSIS — R0600 Dyspnea, unspecified: Secondary | ICD-10-CM | POA: Insufficient documentation

## 2016-01-24 HISTORY — DX: Atherosclerotic heart disease of native coronary artery without angina pectoris: I25.10

## 2016-01-24 NOTE — Progress Notes (Signed)
Pulmonary Individual Treatment Plan  Patient Details  Name: William Lee MRN: [id number] Date of Birth: [birth year] Referring Provider:  Leonard Downing, *  Initial Encounter Date:   Visit Diagnosis: Dyspnea  Patient's Home Medications on Admission:   Current outpatient prescriptions:  .  albuterol (PROVENTIL HFA;VENTOLIN HFA) 108 (90 BASE) MCG/ACT inhaler, Inhale 2 puffs into the lungs every 6 (six) hours as needed for wheezing., Disp: , Rfl:  .  albuterol (PROVENTIL) (5 MG/ML) 0.5% nebulizer solution, Take 2.5 mg by nebulization every 6 (six) hours as needed for wheezing or shortness of breath. , Disp: , Rfl:  .  aspirin 81 MG tablet, Take 81 mg by mouth daily., Disp: , Rfl:  .  atorvastatin (LIPITOR) 40 MG tablet, Take 20 mg by mouth daily as needed., Disp: , Rfl:  .  budesonide-formoterol (SYMBICORT) 160-4.5 MCG/ACT inhaler, Inhale 2 puffs into the lungs 2 (two) times daily., Disp: , Rfl:  .  chlorpheniramine-HYDROcodone (TUSSIONEX) 10-8 MG/5ML SUER, Take 5 mLs by mouth daily as needed (cough). , Disp: , Rfl:  .  cyclobenzaprine (FLEXERIL) 10 MG tablet, Take 10 mg by mouth 2 (two) times daily as needed for muscle spasms. , Disp: , Rfl:  .  diltiazem (CARDIZEM CD) 120 MG 24 hr capsule, Take 120 mg by mouth at bedtime., Disp: , Rfl:  .  fluticasone (FLONASE) 50 MCG/ACT nasal spray, Place 2 sprays into the nose daily as needed for rhinitis. , Disp: , Rfl:  .  omeprazole (PRILOSEC) 20 MG capsule, Take 40 mg by mouth 2 (two) times daily. Take 2 capsules in the morning and 2 capsules in the evening, Disp: , Rfl:  .  potassium chloride 20 MEQ/15ML (10%) SOLN, Take 20 mEq by mouth 2 (two) times daily. , Disp: , Rfl:  .  tiotropium (SPIRIVA) 18 MCG inhalation capsule, Place 18 mcg into inhaler and inhale daily., Disp: , Rfl:  .  Tiotropium Bromide Monohydrate (SPIRIVA RESPIMAT) 2.5 MCG/ACT AERS, Take 2 puffs first thing in am and then another 2 puffs about 12 hours later., Disp: 1 Inhaler,  Rfl: 11 .  traMADol (ULTRAM) 50 MG tablet, Take 50 mg by mouth every 6 (six) hours as needed for pain. Reported on 01/24/2016, Disp: , Rfl:  .  triamterene-hydrochlorothiazide (MAXZIDE-25) 37.5-25 MG per tablet, Take 1 tablet by mouth daily. Reported on 01/24/2016, Disp: , Rfl:   Past Medical History: Past Medical History  Diagnosis Date  . Ischemic heart disease   . MI (myocardial infarction) ([place])   . COPD, severe   . Papilloma of larynx   . Chest pain   . SOB (shortness of breath) on exertion   . Dyslipidemia   . COPD (chronic obstructive pulmonary disease) ([place])   . Hyperlipidemia   . Hypertension   . GERD (gastroesophageal reflux disease)   . Asthma   . PONV (postoperative nausea and vomiting)   . left Inguinal hernia 02/01/2012    Repaired 05/01/12   . 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 Coronary artery disease     Tobacco Use: History  Smoking status  . Former Smoker  . Quit date: 10/30/1990  Smokeless tobacco  . Not on file    Labs:     Recent Review Flowsheet Data    Labs for ITP Cardiac and Pulmonary Rehab Latest Ref Rng 03/27/2013 09/02/2013 07/22/2014 01/21/2015 08/11/2015   Cholestrol 125 - 200 mg/dL 174 148 161 159 165   LDLCALC <  130 mg/dL 101(H) 77 87 79 89   HDL >=40 mg/dL 54.70 47.00 50.10 49.10 65   Trlycerides <150 mg/dL 93.0 121.0 119.0 153.0(H) 56      Capillary Blood Glucose: Lab Results  Component Value Date   GLUCAP 146* 11/12/2011     ADL UCSD:     Pulmonary Assessment Scores      01/25/16 1145       ADL [facility]SD   SOB Score total 58        Pulmonary Function Assessment:     Pulmonary Function Assessment - 01/24/16 1301    Pulmonary Function Tests   FVC% 2.32 %   FEV1% 0.77 %   FEV1/FVC Ratio 32.95   DLCO% 12.24 %   Breath   Bilateral Breath Sounds Clear   Shortness of Breath Yes;Limiting activity      Exercise Target Goals:    Exercise Program Goal: Individual exercise prescription set  with THRR, safety & activity barriers. Participant demonstrates ability to understand and report RPE using BORG scale, to self-measure pulse accurately, and to acknowledge the importance of the exercise prescription.  Exercise Prescription Goal: Starting with aerobic activity 30 plus minutes a day, 3 days per week for initial exercise prescription. Provide home exercise prescription and guidelines that participant acknowledges understanding prior to discharge.  Activity Barriers & Risk Stratification:     Activity Barriers & Cardiac Risk Stratification - 01/24/16 1257    Activity Barriers & Cardiac Risk Stratification   Activity Barriers Arthritis;Deconditioning;Muscular Weakness;Shortness of Breath  of the neck      6 Minute Walk:     6 Minute Walk      01/25/16 1558       6 Minute Walk   Phase Initial     Distance 1429 feet     Walk Time 6 minutes     # of Rest Breaks 0     MPH 2.71     METS 3.92     RPE 12.5     Perceived Dyspnea  1     VO2 Peak 13.73     Symptoms Yes (comment)     Comments Lt leg pain "3/10" on pain scale and Lt leg felt like it was "going to sleep" at the end of the 6MWT per patient.     Resting HR 97 bpm     Resting BP 128/72 mmHg     Max Ex. HR 119 bpm     Max Ex. BP 180/80 mmHg     2 Minute Post BP 158/80 mmHg     Interval HR   Baseline HR 97     1 Minute HR 109     2 Minute HR 107     3 Minute HR 111     4 Minute HR 114     5 Minute HR 119     6 Minute HR 118     2 Minute Post HR 101     Interval Heart Rate? Yes     Interval Oxygen   Interval Oxygen? Yes  Room Air     Baseline Oxygen Saturation % 96 %     1 Minute Oxygen Saturation % 94 %     2 Minute Oxygen Saturation % 93 %     3 Minute Oxygen Saturation % 93 %     4 Minute Oxygen Saturation % 93 %     5 Minute Oxygen Saturation % 93 %     6 Minute Oxygen  Saturation % 92 %     2 Minute Post Oxygen Saturation % 96 %        Initial Exercise Prescription:     Initial Exercise  Prescription - 01/25/16 1600    Date of Initial Exercise Prescription   Date 01/25/16   Treadmill   MPH 2.5   Grade 1   Minutes 15   Bike   Level 0.9   Minutes 15   NuStep   Level 3   METs 2.5   Prescription Details   Frequency (times per week) 2   Duration Progress to 45 minutes of aerobic exercise without signs/symptoms of physical distress   Intensity   THRR 40-80% of Max Heartrate 60-120   Ratings of Perceived Exertion 11-13   Perceived Dyspnea 0-4   Resistance Training   Training Prescription Yes   Weight orange bands   Reps 8-10      Perform Capillary Blood Glucose checks as needed.  Exercise Prescription Changes:   Exercise Comments:   Discharge Exercise Prescription (Final Exercise Prescription Changes):    Nutrition:  Target Goals: Understanding of nutrition guidelines, daily intake of sodium 1500mg , cholesterol 200mg , calories 30% from fat and 7% or less from saturated fats, daily to have 5 or more servings of fruits and vegetables.  Biometrics:     Pre Biometrics - 01/24/16 1307    Pre Biometrics   Grip Strength 37 kg       Nutrition Therapy Plan and Nutrition Goals:   Nutrition Discharge: Rate Your Plate Scores:   Psychosocial: Target Goals: Acknowledge presence or absence of depression, maximize coping skills, provide positive support system. Participant is able to verbalize types and ability to use techniques and skills needed for reducing stress and depression.  Initial Review & Psychosocial Screening:     Initial Psych Review & Screening - 01/24/16 [place]? Yes   Barriers   Psychosocial barriers to participate in program There are no identifiable barriers or psychosocial needs.   Screening Interventions   Interventions Encouraged to exercise      Quality of Life Scores:     Quality of Life - 01/25/16 1145    Quality of Life Scores   Health/Function Pre 22.2 %   Socioeconomic Pre  24.13 %   Psych/Spiritual Pre 20.93 %   Family Pre 25.67 %   GLOBAL Pre 22.17 %      PHQ-9:     Recent Review Flowsheet Data    Depression screen [facility] 2/9 01/24/2016   Decreased Interest 0   Down, Depressed, Hopeless 0   PHQ - 2 Score 0      Psychosocial Evaluation and Intervention:     Psychosocial Evaluation - 01/24/16 1316    Psychosocial Evaluation & Interventions   Interventions Encouraged to exercise with the program and follow exercise prescription   Continued Psychosocial Services Needed No      Psychosocial Re-Evaluation:  Education: Education Goals: Education classes will be provided on a weekly basis, covering required topics. Participant will state understanding/return demonstration of topics presented.  Learning Barriers/Preferences:     Learning Barriers/Preferences - 01/24/16 1259    Learning Barriers/Preferences   Learning Barriers None   Learning Preferences Skilled Demonstration      Education Topics: Risk Factor Reduction:  -Group instruction that is supported by a PowerPoint presentation. Instructor discusses the definition of a risk factor, different risk factors for pulmonary disease, and how the heart and lungs  work together.     Nutrition for Pulmonary Patient:  -Group instruction provided by PowerPoint slides, verbal discussion, and written materials to support subject matter. The instructor gives an explanation and review of healthy diet recommendations, which includes a discussion on weight management, recommendations for fruit and vegetable consumption, as well as protein, fluid, caffeine, fiber, sodium, sugar, and alcohol. Tips for eating when patients are short of breath are discussed.   Pursed Lip Breathing:  -Group instruction that is supported by demonstration and informational handouts. Instructor discusses the benefits of pursed lip and diaphragmatic breathing and detailed demonstration on how to preform both.     Oxygen Safety:   -Group instruction provided by PowerPoint, verbal discussion, and written material to support subject matter. There is an overview of "What is Oxygen" and "Why do we need it".  Instructor also reviews how to create a safe environment for oxygen use, the importance of using oxygen as prescribed, and the risks of noncompliance. There is a brief discussion on traveling with oxygen and resources the patient may utilize.   Oxygen Equipment:  -Group instruction provided by [facility] Staff utilizing handouts, written materials, and equipment demonstrations.   Signs and Symptoms:  -Group instruction provided by written material and verbal discussion to support subject matter. Warning signs and symptoms of infection, stroke, and heart attack are reviewed and when to call the physician/911 reinforced. Tips for preventing the spread of infection discussed.   Advanced Directives:  -Group instruction provided by verbal instruction and written material to support subject matter. Instructor reviews Advanced Directive laws and proper instruction for filling out document.   Pulmonary Video:  -Group video education that reviews the importance of medication and oxygen compliance, exercise, good nutrition, pulmonary hygiene, and pursed lip and diaphragmatic breathing for the pulmonary patient.   Exercise for the Pulmonary Patient:  -Group instruction that is supported by a PowerPoint presentation. Instructor discusses benefits of exercise, core components of exercise, frequency, duration, and intensity of an exercise routine, importance of utilizing pulse oximetry during exercise, safety while exercising, and options of places to exercise outside of rehab.     Pulmonary Medications:  -Verbally interactive group education provided by instructor with focus on inhaled medications and proper administration.   Anatomy and Physiology of the Respiratory System and Intimacy:  -Group instruction provided by  PowerPoint, verbal discussion, and written material to support subject matter. Instructor reviews respiratory cycle and anatomical components of the respiratory system and their functions. Instructor also reviews differences in obstructive and restrictive respiratory diseases with examples of each. Intimacy, Sex, and Sexuality differences are reviewed with a discussion on how relationships can change when diagnosed with pulmonary disease. Common sexual concerns are reviewed.   Knowledge Questionnaire Score:     Knowledge Questionnaire Score - 01/25/16 1144    Knowledge Questionnaire Score   Pre Score 9/13      Core Components/Risk Factors/Patient Goals at Admission:     Personal Goals and Risk Factors at Admission - 01/24/16 1308    Core Components/Risk Factors/Patient Goals on Admission   Increase Strength and Stamina Yes   Intervention Provide advice, education, support and counseling about physical activity/exercise needs.;Develop an individualized exercise prescription for aerobic and resistive training based on initial evaluation findings, risk stratification, comorbidities and participant's personal goals.   Expected Outcomes Achievement of increased cardiorespiratory fitness and enhanced flexibility, muscular endurance and strength shown through measurements of functional capacity and personal statement of participant.   Improve shortness of breath with ADL's  Yes   Intervention Provide education, individualized exercise plan and daily activity instruction to help decrease symptoms of SOB with activities of daily living.   Expected Outcomes Short Term: Achieves a reduction of symptoms when performing activities of daily living.   Develop more efficient breathing techniques such as purse lipped breathing and diaphragmatic breathing; and practicing self-pacing with activity Yes   Intervention Provide education, demonstration and support about specific breathing techniuqes utilized for  more efficient breathing. Include techniques such as pursed lipped breathing, diaphragmatic breathing and self-pacing activity.   Expected Outcomes Short Term: Participant will be able to demonstrate and use breathing techniques as needed throughout daily activities.      Core Components/Risk Factors/Patient Goals Review:      Goals and Risk Factor Review      01/24/16 1313           Core Components/Risk Factors/Patient Goals Review   Personal Goals Review Increase Strength and Stamina;Develop more efficient breathing techniques such as purse lipped breathing and diaphragmatic breathing and practicing self-pacing with activity.;Improve shortness of breath with ADL's       Review purse lip breathing, increase workloads on exercise equipment,        Expected Outcomes more strength and stamina with ADL's, purse lip breathing without cuing and develope more efficient breathing          Core Components/Risk Factors/Patient Goals at Discharge (Final Review):      Goals and Risk Factor Review - 01/24/16 1313    Core Components/Risk Factors/Patient Goals Review   Personal Goals Review Increase Strength and Stamina;Develop more efficient breathing techniques such as purse lipped breathing and diaphragmatic breathing and practicing self-pacing with activity.;Improve shortness of breath with ADL's   Review purse lip breathing, increase workloads on exercise equipment,    Expected Outcomes more strength and stamina with ADL's, purse lip breathing without cuing and develope more efficient breathing      ITP Comments:   Comments: William Lee 70 y.o. male Pulmonary Rehab Orientation Note Patient arrived today in Cardiac and Pulmonary Rehab for orientation to Pulmonary Rehab. He walked from the [facility] independently with taking several rest breaks. He does not carry portable oxygen. Per pt, he uses oxygen never. Color good, skin warm and dry. Patient is oriented to time and place.  Patient's medical history, psychosocial health, and medications reviewed. Psychosocial assessment reveals pt lives with their spouse of 20 years. 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 Pt hobbies include watching football and basketball and spending time with his 2 dogs and 2 household cats.  He also feeds several feral cats near his house. Pt reports his stress level is low. Areas of stress/anxiety include Health. He does not like having to alter his lifestyle due to his emphysema and shortness of breath.    Pt does not exhibit  signs of depression. PHQ2/9 score 0/0. Pt shows good  coping skills with positive outlook .  offered emotional support and reassurance. Will continue to monitor and evaluate progress toward psychosocial goal(s) of maintaining current psychosocial status.  Physical assessment reveals heart rate is normal, breath sounds clear to auscultation, no wheezes, rales, or rhonchi. Grip strength equal, strong. Distal pulses 3+ posterior tibial pulses present without peripheral edema. Patient reports he does take medications as prescribed. Patient states he follows a Regular diet. The patient reports no specific efforts to gain or lose weight.. Patient's weight will be  monitored closely. Demonstration and practice of PLB using pulse oximeter. Patient able to return demonstration satisfactorily. Safety and hand hygiene in the exercise area reviewed with patient. Patient voices understanding of the information reviewed. Department expectations discussed with patient and achievable goals were set. The patient shows enthusiasm about attending the program and we look forward to working with this nice gentleman. The patient is scheduled for a 6 min walk test on Tuesday, January 25, 2016 @ 3:30 pm and to begin exercise on Tuesday, February 01, 2016 in the 1030 class after the medical director signs the ITP and exercise prescription. [id number].

## 2016-01-25 ENCOUNTER — Encounter (HOSPITAL_COMMUNITY)
Admission: RE | Admit: 2016-01-25 | Discharge: 2016-01-25 | Disposition: A | Discharge status: Home / Self Care | Payer: Medicare Other | Source: Ambulatory Visit | Attending: Pulmonary Disease | Admitting: Pulmonary Disease

## 2016-01-25 VITALS — Wt 121.5 lb

## 2016-01-25 DIAGNOSIS — R0600 Dyspnea, unspecified: Secondary | ICD-10-CM | POA: Diagnosis not present

## 2016-02-01 ENCOUNTER — Encounter (HOSPITAL_COMMUNITY)
Admission: RE | Admit: 2016-02-01 | Discharge: 2016-02-01 | Disposition: A | Discharge status: Home / Self Care | Payer: Medicare Other | Source: Ambulatory Visit | Attending: Pulmonary Disease | Admitting: Pulmonary Disease

## 2016-02-01 VITALS — Wt 122.8 lb

## 2016-02-01 DIAGNOSIS — R0600 Dyspnea, unspecified: Secondary | ICD-10-CM | POA: Diagnosis not present

## 2016-02-01 NOTE — Progress Notes (Signed)
Daily Session Note  Patient Details  Name: William Lee MRN: [id number] Date of Birth: [birth year] Referring Provider:  Leonard Downing, *  Encounter Date: 02/01/2016  Check In:     Session Check In - 02/01/16 1015    Check-In   Location [facility]   Staff Present Rodney Langton, Felipe Drone, RN, MHA;Jessica Luan Pulling, MA, ACSM RCEP, Exercise Physiologist;Portia Rollene Rotunda, RN, BSN   Supervising physician immediately available to respond to emergencies [facility] immediately available   Physician(s) Dr. Marily Memos   Medication changes reported     No   Fall or balance concerns reported    No   Warm-up and Cool-down Not performed (comment)   Resistance Training Performed Yes   VAD Patient? No   Pain Assessment   Currently in Pain? No/denies   Multiple Pain Sites No      Capillary Blood Glucose: No results found for this or any previous visit (from the past 24 hour(s)).      Exercise Prescription Changes - 02/01/16 1200    Exercise Review   Progression No   Response to Exercise   Blood Pressure (Admit) 142/72 mmHg   Blood Pressure (Exercise) 128/62 mmHg   Blood Pressure (Exit) 122/72 mmHg   Heart Rate (Admit) 79 bpm   Heart Rate (Exercise) 117 bpm   Heart Rate (Exit) 89 bpm   Oxygen Saturation (Admit) 96 %   Oxygen Saturation (Exercise) 91 %   Oxygen Saturation (Exit) 96 %   Rating of Perceived Exertion (Exercise) 14   Perceived Dyspnea (Exercise) 2   Intensity THRR unchanged   Resistance Training   Training Prescription Yes   Weight orange bands   Reps 8-10   Interval Training   Interval Training No   Treadmill   MPH 2.5   Grade 1   Minutes 15   Recumbant Bike   Level 3   Watts 12   Minutes 15   NuStep   Level 1   METs 1.4     Goals Met:  Exercise tolerated well Queuing for purse lip breathing No report of cardiac concerns or symptoms Strength training completed today  Goals Unmet:  Not Applicable  Comments:  Patient unable to do upright stationary bike, moved to recumbent bike.   Service time is from 1030 to 1210    Dr. Rush Farmer is Medical Director for Pulmonary Rehab at [facility].

## 2016-02-03 ENCOUNTER — Encounter (HOSPITAL_COMMUNITY)
Admission: RE | Admit: 2016-02-03 | Discharge: 2016-02-03 | Disposition: A | Discharge status: Home / Self Care | Payer: Medicare Other | Source: Ambulatory Visit | Attending: Pulmonary Disease | Admitting: Pulmonary Disease

## 2016-02-03 VITALS — Wt 121.9 lb

## 2016-02-03 DIAGNOSIS — R0600 Dyspnea, unspecified: Secondary | ICD-10-CM

## 2016-02-03 NOTE — Progress Notes (Signed)
Daily Session Note  Patient Details  Name: William Lee MRN: [id number] Date of Birth: November 23, 1945 Referring Provider:  Leonard Downing, *  Encounter Date: 02/03/2016  Check In:     Session Check In - 02/03/16 1217    Check-In   Location [facility]   Staff Present Rosebud Poles, RN, BSN;Rubee Vega, MS, ACSM RCEP, Exercise Physiologist;Lisa Ysidro Evert, RN;Portia Rollene Rotunda, RN, BSN   Supervising physician immediately available to respond to emergencies [facility] immediately available   Medication changes reported     Yes      Capillary Blood Glucose: No results found for this or any previous visit (from the past 24 hour(s)).      Exercise Prescription Changes - 02/03/16 1300    Exercise Review   Progression No   Response to Exercise   Blood Pressure (Admit) 126/60 mmHg   Blood Pressure (Exercise) 174/80 mmHg   Blood Pressure (Exit) 104/60 mmHg   Heart Rate (Admit) 86 bpm   Heart Rate (Exercise) 111 bpm   Heart Rate (Exit) 80 bpm   Oxygen Saturation (Admit) 96 %   Oxygen Saturation (Exercise) 90 %   Oxygen Saturation (Exit) 95 %   Rating of Perceived Exertion (Exercise) 13   Perceived Dyspnea (Exercise) 1   Duration Progress to 30 minutes of continuous aerobic without signs/symptoms of physical distress   Intensity THRR unchanged   Progression   Progression Continue progressive overload as per policy without signs/symptoms or physical distress.   Resistance Training   Training Prescription Yes   Weight orange bands   Reps 8-10   Interval Training   Interval Training No   Recumbant Bike   Level 1   Minutes 15   NuStep   Level 1   METs 1.6     Goals Met:  Exercise tolerated well Queuing for purse lip breathing No report of cardiac concerns or symptoms Strength training completed today  Goals Unmet:  Not Applicable  Comments: Service time is from 10:30am to 12:30pm    Dr. Rush Farmer is Medical Director for Pulmonary  Rehab at [facility].

## 2016-02-08 ENCOUNTER — Encounter (HOSPITAL_COMMUNITY)
Admission: RE | Admit: 2016-02-08 | Discharge: 2016-02-08 | Disposition: A | Discharge status: Home / Self Care | Payer: Medicare Other | Source: Ambulatory Visit | Attending: Pulmonary Disease | Admitting: Pulmonary Disease

## 2016-02-08 VITALS — Wt 120.6 lb

## 2016-02-08 DIAGNOSIS — R0600 Dyspnea, unspecified: Secondary | ICD-10-CM | POA: Diagnosis not present

## 2016-02-08 NOTE — Progress Notes (Signed)
Daily Session Note  Patient Details  Name: William Lee MRN: [id number] Date of Birth: 18-Mar-1946 Referring Provider:  Rush Farmer, MD  Encounter Date: 02/08/2016  Check In:     Session Check In - 02/08/16 1105    Check-In   Location [facility]   Staff Present Rodney Langton, RN;Portia Rollene Rotunda, RN, Deland Pretty, MS, ACSM CEP, Exercise Physiologist;Molly diVincenzo, MS, ACSM RCEP, Exercise Physiologist   Supervising physician immediately available to respond to emergencies [facility] immediately available   Physician(s) Dr. Marily Memos   Medication changes reported     No   Fall or balance concerns reported    No   Warm-up and Cool-down Performed as group-led instruction   Resistance Training Performed Yes   VAD Patient? No   Pain Assessment   Currently in Pain? No/denies   Multiple Pain Sites No      Capillary Blood Glucose: No results found for this or any previous visit (from the past 24 hour(s)).      Exercise Prescription Changes - 02/08/16 1200    Exercise Review   Progression Yes   Response to Exercise   Blood Pressure (Admit) 124/64 mmHg   Blood Pressure (Exercise) 154/82 mmHg   Blood Pressure (Exit) 110/60 mmHg   Heart Rate (Admit) 89 bpm   Heart Rate (Exercise) 128 bpm   Heart Rate (Exit) 94 bpm   Oxygen Saturation (Admit) 96 %   Oxygen Saturation (Exercise) 90 %   Oxygen Saturation (Exit) 96 %   Rating of Perceived Exertion (Exercise) 13   Perceived Dyspnea (Exercise) 2   Duration Progress to 45 minutes of aerobic exercise without signs/symptoms of physical distress   Intensity THRR unchanged   Progression   Progression Continue to progress workloads to maintain intensity without signs/symptoms of physical distress.   Resistance Training   Training Prescription Yes   Weight orange bands  orange bands   Reps 10-12   Interval Training   Interval Training No   Treadmill   MPH 2.5   Grade 2   Minutes 15   Recumbant Bike    Level 3   Minutes 15   NuStep   Level 2   METs 1.9     Goals Met:  Exercise tolerated well No report of cardiac concerns or symptoms Strength training completed today  Goals Unmet:  Not Applicable  Comments: Service time is from 1030 to 1205    Dr. Rush Farmer is Medical Director for Pulmonary Rehab at [facility].

## 2016-02-10 ENCOUNTER — Encounter (HOSPITAL_COMMUNITY)
Admission: RE | Admit: 2016-02-10 | Discharge: 2016-02-10 | Disposition: A | Discharge status: Home / Self Care | Payer: Medicare Other | Source: Ambulatory Visit | Attending: Pulmonary Disease | Admitting: Pulmonary Disease

## 2016-02-10 VITALS — Wt 120.2 lb

## 2016-02-10 DIAGNOSIS — R0600 Dyspnea, unspecified: Secondary | ICD-10-CM | POA: Diagnosis not present

## 2016-02-10 NOTE — Progress Notes (Signed)
Daily Session Note  Patient Details  Name: William Lee MRN: [id number] Date of Birth: [birth year] Referring Provider:    Encounter Date: 02/10/2016  Check In:     Session Check In - 02/10/16 1030    Check-In   Location [facility]   Staff Present Su Hilt, MS, ACSM RCEP, Exercise Physiologist;Annedrea Stackhouse, RN, MHA;Abdifatah Colquhoun Rollene Rotunda, RN, Marga Melnick, RN, BSN   Supervising physician immediately available to respond to emergencies [facility] immediately available   Physician(s) Dr. Aggie Moats   Medication changes reported     No   Fall or balance concerns reported    No   Warm-up and Cool-down Performed as group-led instruction   Resistance Training Performed Yes   VAD Patient? No   Pain Assessment   Currently in Pain? No/denies      Capillary Blood Glucose: No results found for this or any previous visit (from the past 24 hour(s)).      Exercise Prescription Changes - 02/10/16 1230    Exercise Review   Progression No   Response to Exercise   Blood Pressure (Admit) 126/60 mmHg   Blood Pressure (Exercise) 164/74 mmHg   Blood Pressure (Exit) 96/54 mmHg  recheck 114/60   Heart Rate (Admit) 93 bpm   Heart Rate (Exercise) 118 bpm   Heart Rate (Exit) 99 bpm   Oxygen Saturation (Admit) 93 %   Oxygen Saturation (Exercise) 87 %   Oxygen Saturation (Exit) 97 %   Rating of Perceived Exertion (Exercise) 13   Perceived Dyspnea (Exercise) 2   Duration Progress to 45 minutes of aerobic exercise without signs/symptoms of physical distress   Intensity THRR unchanged   Progression   Progression Continue to progress workloads to maintain intensity without signs/symptoms of physical distress.   Resistance Training   Training Prescription Yes   Weight orange bands   Reps 10-12   Interval Training   Interval Training No   Treadmill   MPH 2.5   Grade 2   Minutes 15   Recumbant Bike   Level 3   Minutes 15   NuStep   Level --   METs --      Goals Met:  Improved SOB with ADL's Using PLB without cueing & demonstrates good technique Achieving weight loss Exercise tolerated well No report of cardiac concerns or symptoms Strength training completed today  Goals Unmet:  NA   Comments: Service time is from 1030 to 1210    Dr. Rush Farmer is Medical Director for Pulmonary Rehab at [facility].

## 2016-02-15 ENCOUNTER — Encounter (HOSPITAL_COMMUNITY)
Admission: RE | Admit: 2016-02-15 | Discharge: 2016-02-15 | Disposition: A | Discharge status: Home / Self Care | Payer: Medicare Other | Source: Ambulatory Visit | Attending: Pulmonary Disease | Admitting: Pulmonary Disease

## 2016-02-15 VITALS — Wt 123.2 lb

## 2016-02-15 DIAGNOSIS — R0600 Dyspnea, unspecified: Secondary | ICD-10-CM | POA: Diagnosis not present

## 2016-02-15 NOTE — Progress Notes (Signed)
Daily Session Note  Patient Details  Name: William Lee MRN: [id number] Date of Birth: 1946-05-17 Referring Provider:    Encounter Date: 02/15/2016  Check In:     Session Check In - 02/15/16 1230    Check-In   Location [facility]   Staff Present Rosebud Poles, RN, Luisa Hart, RN, BSN;Ramon Dredge, RN, MHA;Molly diVincenzo, MS, ACSM RCEP, Exercise Physiologist   Supervising physician immediately available to respond to emergencies [facility] immediately available   Physician(s) Dr. Marily Memos   Medication changes reported     No   Fall or balance concerns reported    No   Warm-up and Cool-down Performed as group-led instruction   Resistance Training Performed Yes   VAD Patient? No   Pain Assessment   Currently in Pain? No/denies   Multiple Pain Sites No      Capillary Blood Glucose: No results found for this or any previous visit (from the past 24 hour(s)).      Exercise Prescription Changes - 02/15/16 1200    Response to Exercise   Blood Pressure (Admit) 104/56 mmHg   Blood Pressure (Exercise) 130/70 mmHg   Blood Pressure (Exit) 126/66 mmHg   Heart Rate (Admit) 95 bpm   Heart Rate (Exercise) 90 bpm   Heart Rate (Exit) 97 bpm   Oxygen Saturation (Admit) 89 %   Oxygen Saturation (Exercise) 129 %   Oxygen Saturation (Exit) 109 %   Rating of Perceived Exertion (Exercise) 11   Perceived Dyspnea (Exercise) 1   Duration Progress to 45 minutes of aerobic exercise without signs/symptoms of physical distress   Intensity THRR unchanged   Resistance Training   Training Prescription Yes   Weight orange bands   Reps 10-12   Interval Training   Interval Training No   Treadmill   MPH 2.5   Grade 2   Minutes 15   Recumbant Bike   Level 3   Minutes 15   NuStep   Level 2   METs 2.1     Goals Met:  Independence with exercise equipment Improved SOB with ADL's Strength training completed today  Goals Unmet:  Not  Applicable  Comments: Service time is from 1030 to 1210    Dr. Rush Farmer is Medical Director for Pulmonary Rehab at [facility]

## 2016-02-16 ENCOUNTER — Other Ambulatory Visit: Payer: Medicare Other

## 2016-02-16 ENCOUNTER — Ambulatory Visit: Payer: Medicare Other | Admitting: Nurse Practitioner

## 2016-02-17 ENCOUNTER — Encounter (HOSPITAL_COMMUNITY)
Admission: RE | Admit: 2016-02-17 | Discharge: 2016-02-17 | Disposition: A | Discharge status: Home / Self Care | Payer: Medicare Other | Source: Ambulatory Visit | Attending: Pulmonary Disease | Admitting: Pulmonary Disease

## 2016-02-17 DIAGNOSIS — R0600 Dyspnea, unspecified: Secondary | ICD-10-CM | POA: Diagnosis not present

## 2016-02-17 NOTE — Progress Notes (Signed)
Daily Session Note  Patient Details  Name: William Lee MRN: [id number] Date of Birth: 06-25-1946 Referring Provider:    Encounter Date: 02/17/2016  Check In:     Session Check In - 02/17/16 1104    Check-In   Location [facility]   Staff Present Rosebud Poles, RN, BSN;Molly diVincenzo, MS, ACSM RCEP, Exercise Physiologist;Lisa Ysidro Evert, RN;Kawehi Hostetter Rollene Rotunda, RN, BSN   Supervising physician immediately available to respond to emergencies [facility] immediately available   Physician(s) Dr. Marily Memos   Medication changes reported     No   Fall or balance concerns reported    No   Warm-up and Cool-down Performed as group-led instruction   Resistance Training Performed Yes   VAD Patient? No   Pain Assessment   Currently in Pain? No/denies   Multiple Pain Sites No      Capillary Blood Glucose: No results found for this or any previous visit (from the past 24 hour(s)).      Exercise Prescription Changes - 02/17/16 1300    Response to Exercise   Comments --  reviewed home exercise prescription   Home Exercise Plan   Plans to continue exercise at Home   Frequency Add 3 additional days to program exercise sessions.     Goals Met:  Using PLB without cueing & demonstrates good technique Exercise tolerated well No report of cardiac concerns or symptoms Strength training completed today  Goals Unmet:  Not Applicable  Comments: Service time is from 1030 to 1230   Dr. Rush Farmer is Medical Director for Pulmonary Rehab at [facility]

## 2016-02-17 NOTE — Progress Notes (Signed)
Pulmonary Individual Treatment Plan  Patient Details  Name: William Lee MRN: [id number] Date of Birth: Nov 01, 1945 Referring Provider:    Initial Encounter Date:       Pulmonary Rehab Walk Test from 01/25/2016 in [place]   Date  01/25/16      Visit Diagnosis: No diagnosis found.  Patient's Home Medications on Admission:   Current outpatient prescriptions:  .  albuterol (PROVENTIL HFA;VENTOLIN HFA) 108 (90 BASE) MCG/ACT inhaler, Inhale 2 puffs into the lungs every 6 (six) hours as needed for wheezing., Disp: , Rfl:  .  albuterol (PROVENTIL) (5 MG/ML) 0.5% nebulizer solution, Take 2.5 mg by nebulization every 6 (six) hours as needed for wheezing or shortness of breath. , Disp: , Rfl:  .  aspirin 81 MG tablet, Take 81 mg by mouth daily., Disp: , Rfl:  .  atorvastatin (LIPITOR) 40 MG tablet, Take 20 mg by mouth daily as needed., Disp: , Rfl:  .  budesonide-formoterol (SYMBICORT) 160-4.5 MCG/ACT inhaler, Inhale 2 puffs into the lungs 2 (two) times daily., Disp: , Rfl:  .  chlorpheniramine-HYDROcodone (TUSSIONEX) 10-8 MG/5ML SUER, Take 5 mLs by mouth daily as needed (cough). , Disp: , Rfl:  .  cyclobenzaprine (FLEXERIL) 10 MG tablet, Take 10 mg by mouth 2 (two) times daily as needed for muscle spasms. , Disp: , Rfl:  .  diltiazem (CARDIZEM CD) 120 MG 24 hr capsule, Take 120 mg by mouth at bedtime., Disp: , Rfl:  .  fluticasone (FLONASE) 50 MCG/ACT nasal spray, Place 2 sprays into the nose daily as needed for rhinitis. , Disp: , Rfl:  .  omeprazole (PRILOSEC) 20 MG capsule, Take 40 mg by mouth 2 (two) times daily. Take 2 capsules in the morning and 2 capsules in the evening, Disp: , Rfl:  .  potassium chloride 20 MEQ/15ML (10%) SOLN, Take 20 mEq by mouth 2 (two) times daily. , Disp: , Rfl:  .  tiotropium (SPIRIVA) 18 MCG inhalation capsule, Place 18 mcg into inhaler and inhale daily., Disp: , Rfl:  .  Tiotropium Bromide Monohydrate (SPIRIVA RESPIMAT) 2.5  MCG/ACT AERS, Take 2 puffs first thing in am and then another 2 puffs about 12 hours later., Disp: 1 Inhaler, Rfl: 11 .  traMADol (ULTRAM) 50 MG tablet, Take 50 mg by mouth every 6 (six) hours as needed for pain. Reported on 01/24/2016, Disp: , Rfl:  .  triamterene-hydrochlorothiazide (MAXZIDE-25) 37.5-25 MG per tablet, Take 1 tablet by mouth daily. Reported on 01/24/2016, Disp: , Rfl:   Past Medical History: Past Medical History  Diagnosis Date  . Ischemic heart disease   . MI (myocardial infarction) ([place])   . COPD, severe   . Papilloma of larynx   . Chest pain   . SOB (shortness of breath) on exertion   . Dyslipidemia   . COPD (chronic obstructive pulmonary disease) ([place])   . Hyperlipidemia   . Hypertension   . GERD (gastroesophageal reflux disease)   . Asthma   . PONV (postoperative nausea and vomiting)   . left Inguinal hernia 02/01/2012    Repaired 05/01/12   . 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 Coronary artery disease     Tobacco Use: History  Smoking status  . Former Smoker  . Quit date: 10/30/1990  Smokeless tobacco  . Not on file    Labs: Recent Review Flowsheet Data    Labs for ITP Cardiac and Pulmonary Rehab Latest  Ref Rng 03/27/2013 09/02/2013 07/22/2014 01/21/2015 08/11/2015   Cholestrol 125 - 200 mg/dL 174 148 161 159 165   LDLCALC <130 mg/dL 101(H) 77 87 79 89   HDL >=40 mg/dL 54.70 47.00 50.10 49.10 65   Trlycerides <150 mg/dL 93.0 121.0 119.0 153.0(H) 56      Capillary Blood Glucose: Lab Results  Component Value Date   GLUCAP 146* 11/12/2011     ADL [facility]:     Pulmonary Assessment Scores      01/25/16 1145       ADL [facility]   SOB Score total 58        Pulmonary Function Assessment:     Pulmonary Function Assessment - 01/24/16 1301    Pulmonary Function Tests   FVC% 2.32 %   FEV1% 0.77 %   FEV1/FVC Ratio 32.95   DLCO% 12.24 %   Breath   Bilateral Breath Sounds Clear   Shortness of Breath Yes;Limiting  activity      Exercise Target Goals:    Exercise Program Goal: Individual exercise prescription set with THRR, safety & activity barriers. Participant demonstrates ability to understand and report RPE using BORG scale, to self-measure pulse accurately, and to acknowledge the importance of the exercise prescription.  Exercise Prescription Goal: Starting with aerobic activity 30 plus minutes a day, 3 days per week for initial exercise prescription. Provide home exercise prescription and guidelines that participant acknowledges understanding prior to discharge.  Activity Barriers & Risk Stratification:     Activity Barriers & Cardiac Risk Stratification - 01/24/16 1257    Activity Barriers & Cardiac Risk Stratification   Activity Barriers Arthritis;Deconditioning;Muscular Weakness;Shortness of Breath  of the neck      6 Minute Walk:     6 Minute Walk      01/25/16 1558       6 Minute Walk   Phase Initial     Distance 1429 feet     Walk Time 6 minutes     # of Rest Breaks 0     MPH 2.71     METS 3.92     RPE 12.5     Perceived Dyspnea  1     VO2 Peak 13.73     Symptoms Yes (comment)     Comments Lt leg pain "3/10" on pain scale and Lt leg felt like it was "going to sleep" at the end of the 6MWT per patient.     Resting HR 97 bpm     Resting BP 128/72 mmHg     Max Ex. HR 119 bpm     Max Ex. BP 180/80 mmHg     2 Minute Post BP 158/80 mmHg     Interval HR   Baseline HR 97     1 Minute HR 109     2 Minute HR 107     3 Minute HR 111     4 Minute HR 114     5 Minute HR 119     6 Minute HR 118     2 Minute Post HR 101     Interval Heart Rate? Yes     Interval Oxygen   Interval Oxygen? Yes  Room Air     Baseline Oxygen Saturation % 96 %     1 Minute Oxygen Saturation % 94 %     2 Minute Oxygen Saturation % 93 %     3 Minute Oxygen Saturation % 93 %     4 Minute Oxygen  Saturation % 93 %     5 Minute Oxygen Saturation % 93 %     6 Minute Oxygen Saturation % 92 %      2 Minute Post Oxygen Saturation % 96 %        Initial Exercise Prescription:     Initial Exercise Prescription - 01/25/16 1600    Date of Initial Exercise RX and Referring Provider   Date 01/25/16   Treadmill   MPH 2.5   Grade 1   Minutes 15   Bike   Level 0.9   Minutes 15   NuStep   Level 3   METs 2.5   Prescription Details   Frequency (times per week) 2   Duration Progress to 45 minutes of aerobic exercise without signs/symptoms of physical distress   Intensity   THRR 40-80% of Max Heartrate 60-120   Ratings of Perceived Exertion 11-13   Perceived Dyspnea 0-4   Resistance Training   Training Prescription Yes   Weight orange bands   Reps 8-10      Perform Capillary Blood Glucose checks as needed.  Exercise Prescription Changes:     Exercise Prescription Changes      02/01/16 1200 02/03/16 1300 02/08/16 1200 02/10/16 1230 02/15/16 1200   Exercise Review   Progression No No Yes No    Response to Exercise   Blood Pressure (Admit) 142/72 mmHg 126/60 mmHg 124/64 mmHg 126/60 mmHg 104/56 mmHg   Blood Pressure (Exercise) 128/62 mmHg 174/80 mmHg 154/82 mmHg 164/74 mmHg 130/70 mmHg   Blood Pressure (Exit) 122/72 mmHg 104/60 mmHg 110/60 mmHg 96/54 mmHg  recheck 114/60 126/66 mmHg   Heart Rate (Admit) 79 bpm 86 bpm 89 bpm 93 bpm 95 bpm   Heart Rate (Exercise) 117 bpm 111 bpm 128 bpm 118 bpm 90 bpm   Heart Rate (Exit) 89 bpm 80 bpm 94 bpm 99 bpm 97 bpm   Oxygen Saturation (Admit) 96 % 96 % 96 % 93 % 89 %   Oxygen Saturation (Exercise) 91 % 90 % 90 % 87 % 129 %   Oxygen Saturation (Exit) 96 % 95 % 96 % 97 % 109 %   Rating of Perceived Exertion (Exercise) 14 13 13 13 11    Perceived Dyspnea (Exercise) 2 1 2 2 1    Duration  Progress to 30 minutes of continuous aerobic without signs/symptoms of physical distress Progress to 45 minutes of aerobic exercise without signs/symptoms of physical distress Progress to 45 minutes of aerobic exercise without signs/symptoms of physical  distress Progress to 45 minutes of aerobic exercise without signs/symptoms of physical distress   Intensity THRR unchanged THRR unchanged THRR unchanged THRR unchanged THRR unchanged   Progression   Progression  Continue progressive overload as per policy without signs/symptoms or physical distress. Continue to progress workloads to maintain intensity without signs/symptoms of physical distress. Continue to progress workloads to maintain intensity without signs/symptoms of physical distress.    Resistance Training   Training Prescription Yes Yes Yes Yes Yes   Weight orange bands orange bands orange bands  orange bands orange bands orange bands   Reps 8-10 8-10 10-12 10-12 10-12   Interval Training   Interval Training No No No No No   Treadmill   MPH 2.5  2.5 2.5 2.5   Grade 1  2 2 2    Minutes 15  15 15 15    Recumbant Bike   Level 3 1 3 3 3    Watts 12  Minutes 15 15 15 15 15    NuStep   Level 1 1 2  -- 2   METs 1.4 1.6 1.9 -- 2.1      Exercise Comments:     Exercise Comments      02/17/16 0841           Exercise Comments Patient is cont. to progress exercise tolerance. Will discuss home exercise prescription.           Discharge Exercise Prescription (Final Exercise Prescription Changes):     Exercise Prescription Changes - 02/15/16 1200    Response to Exercise   Blood Pressure (Admit) 104/56 mmHg   Blood Pressure (Exercise) 130/70 mmHg   Blood Pressure (Exit) 126/66 mmHg   Heart Rate (Admit) 95 bpm   Heart Rate (Exercise) 90 bpm   Heart Rate (Exit) 97 bpm   Oxygen Saturation (Admit) 89 %   Oxygen Saturation (Exercise) 129 %   Oxygen Saturation (Exit) 109 %   Rating of Perceived Exertion (Exercise) 11   Perceived Dyspnea (Exercise) 1   Duration Progress to 45 minutes of aerobic exercise without signs/symptoms of physical distress   Intensity THRR unchanged   Resistance Training   Training Prescription Yes   Weight orange bands   Reps 10-12   Interval  Training   Interval Training No   Treadmill   MPH 2.5   Grade 2   Minutes 15   Recumbant Bike   Level 3   Minutes 15   NuStep   Level 2   METs 2.1       Nutrition:  Target Goals: Understanding of nutrition guidelines, daily intake of sodium 1500mg , cholesterol 200mg , calories 30% from fat and 7% or less from saturated fats, daily to have 5 or more servings of fruits and vegetables.  Biometrics:     Pre Biometrics - 01/24/16 1307    Pre Biometrics   Grip Strength 37 kg       Nutrition Therapy Plan and Nutrition Goals:   Nutrition Discharge: Rate Your Plate Scores:     Nutrition Assessments - 01/28/16 1119    Rate Your Plate Scores   Pre Score 45      Psychosocial: Target Goals: Acknowledge presence or absence of depression, maximize coping skills, provide positive support system. Participant is able to verbalize types and ability to use techniques and skills needed for reducing stress and depression.  Initial Review & Psychosocial Screening:     Initial Psych Review & Screening - 01/24/16 [place]? Yes   Barriers   Psychosocial barriers to participate in program There are no identifiable barriers or psychosocial needs.   Screening Interventions   Interventions Encouraged to exercise      Quality of Life Scores:     Quality of Life - 01/25/16 1145    Quality of Life Scores   Health/Function Pre 22.2 %   Socioeconomic Pre 24.13 %   Psych/Spiritual Pre 20.93 %   Family Pre 25.67 %   GLOBAL Pre 22.17 %      PHQ-9:     Recent Review Flowsheet Data    Depression screen [facility] 2/9 01/24/2016   Decreased Interest 0   Down, Depressed, Hopeless 0   PHQ - 2 Score 0      Psychosocial Evaluation and Intervention:     Psychosocial Evaluation - 01/24/16 1316    Psychosocial Evaluation & Interventions   Interventions Encouraged to exercise with the program and  follow exercise prescription   Continued Psychosocial  Services Needed No      Psychosocial Re-Evaluation:     Psychosocial Re-Evaluation      02/17/16 0808           Psychosocial Re-Evaluation   Interventions Encouraged to attend Pulmonary Rehabilitation for the exercise         Education: Education Goals: Education classes will be provided on a weekly basis, covering required topics. Participant will state understanding/return demonstration of topics presented.  Learning Barriers/Preferences:     Learning Barriers/Preferences - 01/24/16 1259    Learning Barriers/Preferences   Learning Barriers None   Learning Preferences Skilled Demonstration      Education Topics: Risk Factor Reduction:  -Group instruction that is supported by a PowerPoint presentation. Instructor discusses the definition of a risk factor, different risk factors for pulmonary disease, and how the heart and lungs work together.     Nutrition for Pulmonary Patient:  -Group instruction provided by PowerPoint slides, verbal discussion, and written materials to support subject matter. The instructor gives an explanation and review of healthy diet recommendations, which includes a discussion on weight management, recommendations for fruit and vegetable consumption, as well as protein, fluid, caffeine, fiber, sodium, sugar, and alcohol. Tips for eating when patients are short of breath are discussed.   Pursed Lip Breathing:  -Group instruction that is supported by demonstration and informational handouts. Instructor discusses the benefits of pursed lip and diaphragmatic breathing and detailed demonstration on how to preform both.            PULMONARY REHAB OTHER RESPIRATORY from 02/10/2016 in [place]   Date  02/10/16   Educator  RT   Instruction Review Code  2- meets goals/outcomes      Oxygen Safety:  -Group instruction provided by PowerPoint, verbal discussion, and written material to support subject matter. There is an  overview of "What is Oxygen" and "Why do we need it".  Instructor also reviews how to create a safe environment for oxygen use, the importance of using oxygen as prescribed, and the risks of noncompliance. There is a brief discussion on traveling with oxygen and resources the patient may utilize.      PULMONARY REHAB OTHER RESPIRATORY from 02/10/2016 in [place]   Date  02/03/16   Educator  Trish Fountain   Instruction Review Code  2- meets goals/outcomes      Oxygen Equipment:  -Group instruction provided by [facility] Staff utilizing handouts, written materials, and equipment demonstrations.   Signs and Symptoms:  -Group instruction provided by written material and verbal discussion to support subject matter. Warning signs and symptoms of infection, stroke, and heart attack are reviewed and when to call the physician/911 reinforced. Tips for preventing the spread of infection discussed.   Advanced Directives:  -Group instruction provided by verbal instruction and written material to support subject matter. Instructor reviews Advanced Directive laws and proper instruction for filling out document.   Pulmonary Video:  -Group video education that reviews the importance of medication and oxygen compliance, exercise, good nutrition, pulmonary hygiene, and pursed lip and diaphragmatic breathing for the pulmonary patient.   Exercise for the Pulmonary Patient:  -Group instruction that is supported by a PowerPoint presentation. Instructor discusses benefits of exercise, core components of exercise, frequency, duration, and intensity of an exercise routine, importance of utilizing pulse oximetry during exercise, safety while exercising, and options of places to exercise outside of rehab.  Pulmonary Medications:  -Verbally interactive group education provided by instructor with focus on inhaled medications and proper administration.      PULMONARY REHAB OTHER  RESPIRATORY from 02/10/2016 in [place]   Date  02/10/16   Educator  RT   Instruction Review Code  2- meets goals/outcomes      Anatomy and Physiology of the Respiratory System and Intimacy:  -Group instruction provided by PowerPoint, verbal discussion, and written material to support subject matter. Instructor reviews respiratory cycle and anatomical components of the respiratory system and their functions. Instructor also reviews differences in obstructive and restrictive respiratory diseases with examples of each. Intimacy, Sex, and Sexuality differences are reviewed with a discussion on how relationships can change when diagnosed with pulmonary disease. Common sexual concerns are reviewed.   Knowledge Questionnaire Score:     Knowledge Questionnaire Score - 01/25/16 1144    Knowledge Questionnaire Score   Pre Score 9/13      Core Components/Risk Factors/Patient Goals at Admission:     Personal Goals and Risk Factors at Admission - 01/24/16 1308    Core Components/Risk Factors/Patient Goals on Admission   Increase Strength and Stamina Yes   Intervention Provide advice, education, support and counseling about physical activity/exercise needs.;Develop an individualized exercise prescription for aerobic and resistive training based on initial evaluation findings, risk stratification, comorbidities and participant's personal goals.   Expected Outcomes Achievement of increased cardiorespiratory fitness and enhanced flexibility, muscular endurance and strength shown through measurements of functional capacity and personal statement of participant.   Improve shortness of breath with ADL's Yes   Intervention Provide education, individualized exercise plan and daily activity instruction to help decrease symptoms of SOB with activities of daily living.   Expected Outcomes Short Term: Achieves a reduction of symptoms when performing activities of daily living.    Develop more efficient breathing techniques such as purse lipped breathing and diaphragmatic breathing; and practicing self-pacing with activity Yes   Intervention Provide education, demonstration and support about specific breathing techniuqes utilized for more efficient breathing. Include techniques such as pursed lipped breathing, diaphragmatic breathing and self-pacing activity.   Expected Outcomes Short Term: Participant will be able to demonstrate and use breathing techniques as needed throughout daily activities.      Core Components/Risk Factors/Patient Goals Review:      Goals and Risk Factor Review      01/24/16 1313 02/17/16 0809         Core Components/Risk Factors/Patient Goals Review   Personal Goals Review Increase Strength and Stamina;Develop more efficient breathing techniques such as purse lipped breathing and diaphragmatic breathing and practicing self-pacing with activity.;Improve shortness of breath with ADL's Increase Strength and Stamina;Develop more efficient breathing techniques such as purse lipped breathing and diaphragmatic breathing and practicing self-pacing with activity.;Improve shortness of breath with ADL's      Review purse lip breathing, increase workloads on exercise equipment,  see "comments" section on ITP      Expected Outcomes more strength and stamina with ADL's, purse lip breathing without cuing and develope more efficient breathing see Admission expected outcomes         Core Components/Risk Factors/Patient Goals at Discharge (Final Review):      Goals and Risk Factor Review - 02/17/16 0809    Core Components/Risk Factors/Patient Goals Review   Personal Goals Review Increase Strength and Stamina;Develop more efficient breathing techniques such as purse lipped breathing and diaphragmatic breathing and practicing self-pacing with activity.;Improve shortness of breath with  ADL's   Review see "comments" section on ITP   Expected Outcomes see  Admission expected outcomes      ITP Comments:   Comments: ITP REVIEW Pt is making expected progress toward personal goals after completing 5 sessions. Recommend continued exercise, life style modification, education, and utilization of breathing techniques to increase stamina and strength and decrease shortness of breath with exertion.

## 2016-02-17 NOTE — Progress Notes (Signed)
I have reviewed a Home Exercise Prescription with William Lee . [name] is not currently exercising at home.  The patient was advised to walk 2-3 days a week for 30 minutes.  [name] and I discussed how to progress their exercise prescription.  The patient stated that their goals were to increase strength and preform activities of daily living easier.  The patient stated that they understand the exercise prescription.  We reviewed exercise guidelines, target heart rate during exercise, oxygen use, weather, home pulse oximeter, endpoints for exercise, and goals.  Patient is encouraged to come to me with any questions. I will continue to follow up with the patient to assist them with progression and safety.

## 2016-02-22 ENCOUNTER — Encounter (HOSPITAL_COMMUNITY)
Admission: RE | Admit: 2016-02-22 | Discharge: 2016-02-22 | Disposition: A | Discharge status: Home / Self Care | Payer: Medicare Other | Source: Ambulatory Visit | Attending: Pulmonary Disease | Admitting: Pulmonary Disease

## 2016-02-22 VITALS — Wt 120.6 lb

## 2016-02-22 DIAGNOSIS — R0600 Dyspnea, unspecified: Secondary | ICD-10-CM

## 2016-02-22 NOTE — Progress Notes (Signed)
William Lee 70 y.o. male Nutrition Note Spoke with pt. Pt is at a normal wt for his ht. Pt reports a history of slow wt loss over the past 6 months. According to pt, his UBW is 130 lb. Per review of pt wt in the EMR, pt wt is down ~4 lbs over the past year. Pt wt today is 54.7 kg, which is down 0.6 kg since admission. Pt feels his wt is down because his wife has been taking care of her parents "that live 6 miles down the road for the last year." Pt prepares most of his meals. Pt states "I like a lot of junk food; doughnuts, cookies, etc." Pt educated re: high calorie, high protein diet. Pt's Rate Your Plate results not reviewed with pt due to pt wanting to "gain wt." Pt reports his MD "wants me to gain wt too." Pt does not avoid salty food. The role of sodium in lung disease reviewed with pt. Pt expressed understanding of the information reviewed via feedback method.    Vitals - 1 value per visit 02/22/2016 02/17/2016 02/15/2016 02/10/2016  Weight (lb) 120.59 122.14 123.24 120.15   Vitals - 1 value per visit 02/08/2016 02/03/2016 02/01/2016 01/25/2016 01/24/2016  Weight (lb) 120.59 121.91 122.8 121.47 121.91   Vitals - 1 value per visit 10/22/2015 08/11/2015 01/21/2015 07/22/2014  Weight (lb) 123.4 122.12 124.4 119   Vitals - 1 value per visit 09/23/2013  Weight (lb) 128   Nutrition Diagnosis ? Food-and nutrition-related knowledge deficit related to lack of exposure to information as related to diagnosis of pulmonary disease ? Unintentional wt loss related to fatigue during food preparation and decreased food intake as evidenced by wt loss. ?  Nutrition Rx/Est. Daily Nutrition Needs for: ? wt gain 2300-2800 Kcal  85-110 gm protein   1500 mg or less sodium  Nutrition Intervention ? Pt's individual nutrition plan and goals reviewed with pt. ? Benefits of adopting healthy eating habits discussed when pt's Rate Your Plate reviewed. ? Handouts given for: High Calorie, High Protein diet; Suggestions for  Increasing Calories and Protein; and High Calorie, High Protein recipes ? Pt encouraged to consume a nutrition supplement at least 1 time/d ? Pt to attend the Nutrition and Lung Disease class ? Continual client-centered nutrition education by RD, as part of interdisciplinary care.  Goal(s) 1. The pt will recognize symptoms that can interfere with adequate oral intake, such as shortness of breath, N/V, early satiety, fatigue, ability to secure and prepare food, taste and smell changes, chewing/swallowing difficulties, and/ or pain when eating. 2. The pt will consume high-energy, high-nutrient dense beverages when necessary to compensate for decreased oral intake of solid foods. 3. Identify food quantities necessary to achieve wt gain to 124-130 lb at graduation from pulmonary rehab. Monitor and Evaluate progress toward nutrition goal with team.   Derek Mound, M.Ed, RD, LDN, CDE 02/22/2016 1:55 PM

## 2016-02-22 NOTE — Progress Notes (Signed)
Daily Session Note  Patient Details  Name: William Lee MRN: [id number] Date of Birth: [birth year] Referring Provider:    Encounter Date: 02/22/2016  Check In:     Session Check In - 02/22/16 1231    Check-In   Location [facility]   Staff Present Su Hilt, MS, ACSM RCEP, Exercise Physiologist;Joan Leonia Reeves, RN, Luisa Hart, RN, [employer], RN, Roque Cash, RN   Supervising physician immediately available to respond to emergencies [facility] immediately available   Physician(s) Dr. Marily Memos   Medication changes reported     No   Fall or balance concerns reported    No   Warm-up and Cool-down Performed as group-led instruction   Resistance Training Performed Yes   VAD Patient? No   Pain Assessment   Currently in Pain? No/denies   Multiple Pain Sites No      Capillary Blood Glucose: No results found for this or any previous visit (from the past 24 hour(s)).      Exercise Prescription Changes - 02/22/16 1200    Response to Exercise   Blood Pressure (Admit) 122/60 mmHg   Blood Pressure (Exercise) 144/80 mmHg   Blood Pressure (Exit) 106/50 mmHg   Heart Rate (Admit) 78 bpm   Heart Rate (Exercise) 116 bpm   Heart Rate (Exit) 95 bpm   Oxygen Saturation (Admit) 98 %   Oxygen Saturation (Exercise) 91 %   Oxygen Saturation (Exit) 97 %   Rating of Perceived Exertion (Exercise) 13   Perceived Dyspnea (Exercise) 2   Duration Progress to 45 minutes of aerobic exercise without signs/symptoms of physical distress   Intensity THRR unchanged   Progression   Progression Continue to progress workloads to maintain intensity without signs/symptoms of physical distress.   Resistance Training   Training Prescription Yes   Weight orange bands   Reps 10-12   Interval Training   Interval Training No   Treadmill   MPH 2   Grade 1   Minutes 15   Recumbant Bike   Level 3   Minutes 15   NuStep   Level 4   METs 2.4     Goals Met:  Queuing  for purse lip breathing No report of cardiac concerns or symptoms Strength training completed today  Goals Unmet:  Left leg claudication pain on treadmill  Comments: Service time is from 10:30am to 12:05pm    Dr. Rush Farmer is Medical Director for Pulmonary Rehab at [facility].

## 2016-02-24 ENCOUNTER — Encounter (HOSPITAL_COMMUNITY)
Admission: RE | Admit: 2016-02-24 | Discharge: 2016-02-24 | Disposition: A | Discharge status: Home / Self Care | Payer: Medicare Other | Source: Ambulatory Visit | Attending: Pulmonary Disease | Admitting: Pulmonary Disease

## 2016-02-24 VITALS — Wt 120.8 lb

## 2016-02-24 DIAGNOSIS — R0600 Dyspnea, unspecified: Secondary | ICD-10-CM

## 2016-02-24 NOTE — Addendum Note (Signed)
Encounter addended by: Lance Morin, RN on: 02/24/2016 12:53 PM<BR>     Documentation filed: Inpatient Document Flowsheet

## 2016-02-24 NOTE — Progress Notes (Signed)
Daily Session Note  Patient Details  Name: MCGWIRE DASARO MRN: [id number] Date of Birth: 1946-10-27 Referring Provider:    Encounter Date: 02/24/2016  Check In:     Session Check In - 02/24/16 1220    Check-In   [facility]   Staff Present Rosebud Poles, RN, BSN;Lisa Ysidro Evert, RN;Portia Rollene Rotunda, RN, BSN;Ramon Dredge, RN, MHA;Molly diVincenzo, MS, ACSM RCEP, Exercise Physiologist   Supervising physician immediately available to respond to emergencies [facility] immediately available   Physician(s) Dr. Eliseo Squires   Medication changes reported     No   Fall or balance concerns reported    No   Warm-up and Cool-down Performed as group-led instruction   Resistance Training Performed Yes   VAD Patient? No   Pain Assessment   Currently in Pain? No/denies   Multiple Pain Sites No      Capillary Blood Glucose: No results found for this or any previous visit (from the past 24 hour(s)).   Goals Met:  Independence with exercise equipment Exercise tolerated well Strength training completed today  Goals Unmet:  Not Applicable  Comments:Service time is from 1030 to 1205     Dr. Rush Farmer is Medical Director for Pulmonary Rehab at [facility].

## 2016-02-29 ENCOUNTER — Encounter (HOSPITAL_COMMUNITY)
Admission: RE | Admit: 2016-02-29 | Discharge: 2016-02-29 | Disposition: A | Discharge status: Home / Self Care | Payer: Medicare Other | Source: Ambulatory Visit | Attending: Pulmonary Disease | Admitting: Pulmonary Disease

## 2016-02-29 VITALS — Wt 123.7 lb

## 2016-02-29 DIAGNOSIS — R0600 Dyspnea, unspecified: Secondary | ICD-10-CM | POA: Insufficient documentation

## 2016-02-29 NOTE — Progress Notes (Signed)
Daily Session Note  Patient Details  Name: William Lee MRN: [id number] Date of Birth: 26-May-1946 Referring Provider:    Encounter Date: 02/29/2016  Check In:     Session Check In - 02/29/16 1146    Check-In   [facility]   Staff Present Rosebud Poles, RN, Deland Pretty, MS, ACSM CEP, Exercise Physiologist;Lovella Hardie, MS, ACSM RCEP, Exercise Physiologist;Portia Rollene Rotunda, RN, BSN   Supervising physician immediately available to respond to emergencies [facility] immediately available   Physician(s) Dr. Marily Memos   Medication changes reported     No   Fall or balance concerns reported    No   Warm-up and Cool-down Performed as group-led instruction   Resistance Training Performed Yes   VAD Patient? No   Pain Assessment   Currently in Pain? No/denies   Multiple Pain Sites No      Capillary Blood Glucose: No results found for this or any previous visit (from the past 24 hour(s)).      Exercise Prescription Changes - 02/29/16 1200    Exercise Review   Progression Yes   Response to Exercise   Blood Pressure (Admit) 102/60 mmHg   Blood Pressure (Exercise) 146/72 mmHg   Blood Pressure (Exit) 112/66 mmHg   Heart Rate (Admit) 89 bpm   Heart Rate (Exercise) 124 bpm   Heart Rate (Exit) 95 bpm   Oxygen Saturation (Admit) 97 %   Oxygen Saturation (Exercise) 90 %   Oxygen Saturation (Exit) 96 %   Rating of Perceived Exertion (Exercise) 13   Perceived Dyspnea (Exercise) 2   Duration Progress to 45 minutes of aerobic exercise without signs/symptoms of physical distress   Intensity THRR unchanged   Progression   Progression Continue to progress workloads to maintain intensity without signs/symptoms of physical distress.   Resistance Training   Training Prescription Yes   Weight BLUE   Reps 10-12   Interval Training   Interval Training No   Treadmill   MPH 2.5   Grade 1   Minutes 15   Recumbant Bike   Level 3   Minutes 15   NuStep   Level 5   Minutes 15   METs 2.4     Goals Met:  Personal goals reviewed No report of cardiac concerns or symptoms Strength training completed today  Goals Unmet:  Not Applicable  Comments: Service time is from 10:30AM to 12:10PM    Dr. Rush Farmer is Medical Director for Pulmonary Rehab at [facility].

## 2016-03-02 ENCOUNTER — Encounter (HOSPITAL_COMMUNITY)
Admission: RE | Admit: 2016-03-02 | Discharge: 2016-03-02 | Disposition: A | Discharge status: Home / Self Care | Payer: Medicare Other | Source: Ambulatory Visit | Attending: Pulmonary Disease | Admitting: Pulmonary Disease

## 2016-03-02 VITALS — Wt 123.9 lb

## 2016-03-02 DIAGNOSIS — R0600 Dyspnea, unspecified: Secondary | ICD-10-CM | POA: Diagnosis not present

## 2016-03-02 NOTE — Progress Notes (Signed)
Daily Session Note  Patient Details  Name: William Lee MRN: [id number] Date of Birth: October 11, 1946 Referring Provider:    Encounter Date: 03/02/2016  Check In:     Session Check In - 03/02/16 1245    Check-In   [facility]   Staff Present Rosebud Poles, RN, BSN;Lisa Ysidro Evert, RN;Portia Rollene Rotunda, RN, BSN;Molly diVincenzo, MS, ACSM RCEP, Exercise Physiologist   Supervising physician immediately available to respond to emergencies [facility] immediately available   Physician(s) Dr. Waldron Labs   Medication changes reported     No   Fall or balance concerns reported    No   Warm-up and Cool-down Performed as group-led instruction   Resistance Training Performed Yes   VAD Patient? No   Pain Assessment   Currently in Pain? No/denies   Multiple Pain Sites No      Capillary Blood Glucose: No results found for this or any previous visit (from the past 24 hour(s)).   Goals Met:  Exercise tolerated well Strength training completed today  Goals Unmet:  Not Applicable  Comments: Service time is from 1030 to 1230    Dr. Rush Farmer is Medical Director for Pulmonary Rehab at [facility].

## 2016-03-07 ENCOUNTER — Encounter (HOSPITAL_COMMUNITY)
Admission: RE | Admit: 2016-03-07 | Discharge: 2016-03-07 | Disposition: A | Discharge status: Home / Self Care | Payer: Medicare Other | Source: Ambulatory Visit | Attending: Pulmonary Disease | Admitting: Pulmonary Disease

## 2016-03-07 VITALS — Wt 122.4 lb

## 2016-03-07 DIAGNOSIS — R0600 Dyspnea, unspecified: Secondary | ICD-10-CM

## 2016-03-07 NOTE — Progress Notes (Signed)
Daily Session Note  Patient Details  Name: William Lee MRN: [id number] Date of Birth: [birth year] Referring Provider:    Encounter Date: 03/07/2016  Check In:     Session Check In - 03/07/16 1221    Check-In   [facility]   Staff Present Su Hilt, MS, ACSM RCEP, Exercise Physiologist;Lisa Ysidro Evert, Felipe Drone, RN, MHA;Portia Rollene Rotunda, RN, BSN   Physician(s) Dr. Marily Memos   Medication changes reported     No   Fall or balance concerns reported    No   Warm-up and Cool-down Performed as group-led instruction   Resistance Training Performed Yes   Pain Assessment   Currently in Pain? No/denies   Multiple Pain Sites No      Capillary Blood Glucose: No results found for this or any previous visit (from the past 24 hour(s)).      Exercise Prescription Changes - 03/07/16 1200    Response to Exercise   Blood Pressure (Admit) 112/54 mmHg   Blood Pressure (Exercise) 120/68 mmHg   Blood Pressure (Exit) 122/60 mmHg   Heart Rate (Admit) 88 bpm   Heart Rate (Exercise) 117 bpm   Heart Rate (Exit) 94 bpm   Oxygen Saturation (Admit) 95 %   Oxygen Saturation (Exercise) 89 %   Oxygen Saturation (Exit) 94 %   Rating of Perceived Exertion (Exercise) 11   Perceived Dyspnea (Exercise) 1   Duration Progress to 45 minutes of aerobic exercise without signs/symptoms of physical distress   Intensity THRR unchanged   Progression   Progression Continue to progress workloads to maintain intensity without signs/symptoms of physical distress.   Resistance Training   Training Prescription Yes   Weight BLUE   Reps 10-12   Interval Training   Interval Training No   Treadmill   MPH 2.5   Grade 1   Minutes 15   Recumbant Bike   Level 3   Minutes 15   NuStep   Level 5   Minutes 15   METs 1.9     Goals Met:  Exercise tolerated well No report of cardiac concerns or symptoms Strength training completed today  Goals Unmet:  Not  Applicable  Comments: Service time is from 10:30am to 12:10pm    Dr. Rush Farmer is Medical Director for Pulmonary Rehab at [facility].

## 2016-03-09 ENCOUNTER — Encounter (HOSPITAL_COMMUNITY)
Admission: RE | Admit: 2016-03-09 | Discharge: 2016-03-09 | Disposition: A | Discharge status: Home / Self Care | Payer: Medicare Other | Source: Ambulatory Visit | Attending: Pulmonary Disease | Admitting: Pulmonary Disease

## 2016-03-09 DIAGNOSIS — R0600 Dyspnea, unspecified: Secondary | ICD-10-CM | POA: Diagnosis not present

## 2016-03-09 NOTE — Progress Notes (Signed)
Daily Session Note  Patient Details  Name: William Lee MRN: [id number] Date of Birth: 05-08-1946 Referring Provider:    Encounter Date: 03/09/2016  Check In:     Session Check In - 03/09/16 1107    Check-In   [facility]   Staff Present Rosebud Poles, RN, BSN;Purity Irmen, MS, ACSM RCEP, Exercise Physiologist;Portia Rollene Rotunda, Therapist, sports, BSN;Ramon Dredge, RN, [facility]   Supervising physician immediately available to respond to emergencies [facility] immediately available   Physician(s) Dr. Marily Memos   Medication changes reported     No   Fall or balance concerns reported    No   Warm-up and Cool-down Performed as group-led instruction   Resistance Training Performed Yes   VAD Patient? No   Pain Assessment   Currently in Pain? No/denies      Capillary Blood Glucose: No results found for this or any previous visit (from the past 24 hour(s)).      Exercise Prescription Changes - 03/09/16 1300    Response to Exercise   Blood Pressure (Admit) 120/60 mmHg   Blood Pressure (Exercise) 160/72 mmHg   Blood Pressure (Exit) 106/50 mmHg   Heart Rate (Admit) 87 bpm   Heart Rate (Exercise) 113 bpm   Heart Rate (Exit) 100 bpm   Oxygen Saturation (Admit) 97 %   Oxygen Saturation (Exercise) 92 %   Oxygen Saturation (Exit) 97 %   Rating of Perceived Exertion (Exercise) 13   Perceived Dyspnea (Exercise) 2   Duration Progress to 45 minutes of aerobic exercise without signs/symptoms of physical distress   Intensity THRR unchanged   Progression   Progression Continue to progress workloads to maintain intensity without signs/symptoms of physical distress.   Resistance Training   Training Prescription Yes   Weight BLUE   Reps 10-12   Interval Training   Interval Training No   Treadmill   MPH 2.5   Grade 1   Minutes 15   Recumbant Bike   Level 3   Minutes 15     Goals Met:  Exercise tolerated well No report of cardiac concerns or symptoms Strength  training completed today  Goals Unmet:  Not Applicable  Comments: Service time is from 10:30am to 12:35pm    Dr. Rush Farmer is Medical Director for Pulmonary Rehab at [facility].

## 2016-03-14 ENCOUNTER — Encounter (HOSPITAL_COMMUNITY)
Admission: RE | Admit: 2016-03-14 | Discharge: 2016-03-14 | Disposition: A | Discharge status: Home / Self Care | Payer: Medicare Other | Source: Ambulatory Visit | Attending: Pulmonary Disease | Admitting: Pulmonary Disease

## 2016-03-14 VITALS — Wt 125.4 lb

## 2016-03-14 DIAGNOSIS — R0600 Dyspnea, unspecified: Secondary | ICD-10-CM | POA: Diagnosis not present

## 2016-03-14 NOTE — Progress Notes (Signed)
Daily Session Note  Patient Details  Name: William Lee MRN: [id number] Date of Birth: 03/25/1946 Referring Provider:    Encounter Date: 03/14/2016  Check In:     Session Check In - 03/14/16 1009    Check-In   Location [facility]   Staff Present Joan Behrens, RN, BSN;Molly diVincenzo, MS, ACSM RCEP, Exercise Physiologist;Portia Payne, RN, BSN;Annedrea Stackhouse, RN, MHA   Supervising physician immediately available to respond to emergencies [facility] immediately available   Physician(s) Dr. Merrell   Medication changes reported     No   Fall or balance concerns reported    No   Warm-up and Cool-down Performed as group-led instruction   Resistance Training Performed Yes   VAD Patient? No   Pain Assessment   Currently in Pain? No/denies   Multiple Pain Sites No      Capillary Blood Glucose: No results found for this or any previous visit (from the past 24 hour(s)).      Exercise Prescription Changes - 03/14/16 1200    Exercise Review   Progression Yes   Response to Exercise   Blood Pressure (Admit) 120/56 mmHg   Blood Pressure (Exercise) 138/60 mmHg   Blood Pressure (Exit) 99/64 mmHg   Heart Rate (Admit) 82 bpm   Heart Rate (Exercise) 108 bpm   Heart Rate (Exit) 89 bpm   Oxygen Saturation (Admit) 97 %   Oxygen Saturation (Exercise) 90 %   Oxygen Saturation (Exit) 97 %   Rating of Perceived Exertion (Exercise) 12   Perceived Dyspnea (Exercise) 2   Duration Progress to 45 minutes of aerobic exercise without signs/symptoms of physical distress   Intensity THRR unchanged   Progression   Progression Continue to progress workloads to maintain intensity without signs/symptoms of physical distress.   Resistance Training   Training Prescription Yes   Weight BLUE   Reps 10-12   Interval Training   Interval Training No   Treadmill   MPH 2.5   Grade 1   Minutes 15   Recumbant Bike   Level 3   Minutes 15   NuStep   Level 6   Minutes 15   METs 2.5     Goals Met:  Exercise tolerated well Strength training completed today  Goals Unmet:  Not Applicable  Comments: Service time is from 10:30am to 12:05pm    Dr. Wesam G. Yacoub is Medical Director for Pulmonary Rehab at [facility]. 

## 2016-03-16 ENCOUNTER — Encounter (HOSPITAL_COMMUNITY)
Admission: RE | Admit: 2016-03-16 | Discharge: 2016-03-16 | Disposition: A | Discharge status: Home / Self Care | Payer: Medicare Other | Source: Ambulatory Visit | Attending: Pulmonary Disease | Admitting: Pulmonary Disease

## 2016-03-16 VITALS — Wt 126.3 lb

## 2016-03-16 DIAGNOSIS — R0600 Dyspnea, unspecified: Secondary | ICD-10-CM | POA: Diagnosis not present

## 2016-03-16 NOTE — Progress Notes (Signed)
Pulmonary Individual Treatment Plan  Patient Details  Name: William Lee MRN: [id number] Date of Birth: 28-Oct-1946 Referring Provider:    Initial Encounter Date:       Pulmonary Rehab Walk Test from 01/25/2016 in [place]   Date  01/25/16      Visit Diagnosis: Dyspnea  Patient's Home Medications on Admission:   Current outpatient prescriptions:  .  albuterol (PROVENTIL HFA;VENTOLIN HFA) 108 (90 BASE) MCG/ACT inhaler, Inhale 2 puffs into the lungs every 6 (six) hours as needed for wheezing., Disp: , Rfl:  .  albuterol (PROVENTIL) (5 MG/ML) 0.5% nebulizer solution, Take 2.5 mg by nebulization every 6 (six) hours as needed for wheezing or shortness of breath. , Disp: , Rfl:  .  aspirin 81 MG tablet, Take 81 mg by mouth daily., Disp: , Rfl:  .  atorvastatin (LIPITOR) 40 MG tablet, Take 20 mg by mouth daily as needed., Disp: , Rfl:  .  budesonide-formoterol (SYMBICORT) 160-4.5 MCG/ACT inhaler, Inhale 2 puffs into the lungs 2 (two) times daily., Disp: , Rfl:  .  chlorpheniramine-HYDROcodone (TUSSIONEX) 10-8 MG/5ML SUER, Take 5 mLs by mouth daily as needed (cough). , Disp: , Rfl:  .  cyclobenzaprine (FLEXERIL) 10 MG tablet, Take 10 mg by mouth 2 (two) times daily as needed for muscle spasms. , Disp: , Rfl:  .  diltiazem (CARDIZEM CD) 120 MG 24 hr capsule, Take 120 mg by mouth at bedtime., Disp: , Rfl:  .  fluticasone (FLONASE) 50 MCG/ACT nasal spray, Place 2 sprays into the nose daily as needed for rhinitis. , Disp: , Rfl:  .  omeprazole (PRILOSEC) 20 MG capsule, Take 40 mg by mouth 2 (two) times daily. Take 2 capsules in the morning and 2 capsules in the evening, Disp: , Rfl:  .  potassium chloride 20 MEQ/15ML (10%) SOLN, Take 20 mEq by mouth 2 (two) times daily. , Disp: , Rfl:  .  tiotropium (SPIRIVA) 18 MCG inhalation capsule, Place 18 mcg into inhaler and inhale daily., Disp: , Rfl:  .  Tiotropium Bromide Monohydrate (SPIRIVA RESPIMAT) 2.5 MCG/ACT AERS,  Take 2 puffs first thing in am and then another 2 puffs about 12 hours later., Disp: 1 Inhaler, Rfl: 11 .  traMADol (ULTRAM) 50 MG tablet, Take 50 mg by mouth every 6 (six) hours as needed for pain. Reported on 01/24/2016, Disp: , Rfl:  .  triamterene-hydrochlorothiazide (MAXZIDE-25) 37.5-25 MG per tablet, Take 1 tablet by mouth daily. Reported on 01/24/2016, Disp: , Rfl:   Past Medical History: Past Medical History  Diagnosis Date  . Ischemic heart disease   . MI (myocardial infarction) ([place])   . COPD, severe   . Papilloma of larynx   . Chest pain   . SOB (shortness of breath) on exertion   . Dyslipidemia   . COPD (chronic obstructive pulmonary disease) ([place])   . Hyperlipidemia   . Hypertension   . GERD (gastroesophageal reflux disease)   . Asthma   . PONV (postoperative nausea and vomiting)   . left Inguinal hernia 02/01/2012    Repaired 05/01/12   . 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 Coronary artery disease     Tobacco Use: History  Smoking status  . Former Smoker  . Quit date: 10/30/1990  Smokeless tobacco  . Not on file    Labs: Recent Review Flowsheet Data    Labs for ITP Cardiac and Pulmonary Rehab Latest Ref Rng  03/27/2013 09/02/2013 07/22/2014 01/21/2015 08/11/2015   Cholestrol 125 - 200 mg/dL 174 148 161 159 165   LDLCALC <130 mg/dL 101(H) 77 87 79 89   HDL >=40 mg/dL 54.70 47.00 50.10 49.10 65   Trlycerides <150 mg/dL 93.0 121.0 119.0 153.0(H) 56      Capillary Blood Glucose: Lab Results  Component Value Date   GLUCAP 146* 11/12/2011     ADL UCSD:     Pulmonary Assessment Scores      01/25/16 1145       ADL UCSD   SOB Score total 58        Pulmonary Function Assessment:     Pulmonary Function Assessment - 01/24/16 1301    Pulmonary Function Tests   FVC% 2.32 %   FEV1% 0.77 %   FEV1/FVC Ratio 32.95   DLCO% 12.24 %   Breath   Bilateral Breath Sounds Clear   Shortness of Breath Yes;Limiting activity       Exercise Target Goals:    Exercise Program Goal: Individual exercise prescription set with THRR, safety & activity barriers. Participant demonstrates ability to understand and report RPE using BORG scale, to self-measure pulse accurately, and to acknowledge the importance of the exercise prescription.  Exercise Prescription Goal: Starting with aerobic activity 30 plus minutes a day, 3 days per week for initial exercise prescription. Provide home exercise prescription and guidelines that participant acknowledges understanding prior to discharge.  Activity Barriers & Risk Stratification:     Activity Barriers & Cardiac Risk Stratification - 01/24/16 1257    Activity Barriers & Cardiac Risk Stratification   Activity Barriers Arthritis;Deconditioning;Muscular Weakness;Shortness of Breath  of the neck      6 Minute Walk:     6 Minute Walk      01/25/16 1558       6 Minute Walk   Phase Initial     Distance 1429 feet     Walk Time 6 minutes     # of Rest Breaks 0     MPH 2.71     METS 3.92     RPE 12.5     Perceived Dyspnea  1     VO2 Peak 13.73     Symptoms Yes (comment)     Comments Lt leg pain "3/10" on pain scale and Lt leg felt like it was "going to sleep" at the end of the 6MWT per patient.     Resting HR 97 bpm     Resting BP 128/72 mmHg     Max Ex. HR 119 bpm     Max Ex. BP 180/80 mmHg     2 Minute Post BP 158/80 mmHg     Interval HR   Baseline HR 97     1 Minute HR 109     2 Minute HR 107     3 Minute HR 111     4 Minute HR 114     5 Minute HR 119     6 Minute HR 118     2 Minute Post HR 101     Interval Heart Rate? Yes     Interval Oxygen   Interval Oxygen? Yes  Room Air     Baseline Oxygen Saturation % 96 %     1 Minute Oxygen Saturation % 94 %     2 Minute Oxygen Saturation % 93 %     3 Minute Oxygen Saturation % 93 %     4 Minute Oxygen Saturation %  93 %     5 Minute Oxygen Saturation % 93 %     6 Minute Oxygen Saturation % 92 %     2 Minute  Post Oxygen Saturation % 96 %        Initial Exercise Prescription:     Initial Exercise Prescription - 01/25/16 1600    Date of Initial Exercise RX and Referring Provider   Date 01/25/16   Treadmill   MPH 2.5   Grade 1   Minutes 15   Bike   Level 0.9   Minutes 15   NuStep   Level 3   METs 2.5   Prescription Details   Frequency (times per week) 2   Duration Progress to 45 minutes of aerobic exercise without signs/symptoms of physical distress   Intensity   THRR 40-80% of Max Heartrate 60-120   Ratings of Perceived Exertion 11-13   Perceived Dyspnea 0-4   Resistance Training   Training Prescription Yes   Weight orange bands   Reps 8-10      Perform Capillary Blood Glucose checks as needed.  Exercise Prescription Changes:     Exercise Prescription Changes      02/01/16 1200 02/03/16 1300 02/08/16 1200 02/10/16 1230 02/15/16 1200   Exercise Review   Progression No No Yes No    Response to Exercise   Blood Pressure (Admit) 142/72 mmHg 126/60 mmHg 124/64 mmHg 126/60 mmHg 104/56 mmHg   Blood Pressure (Exercise) 128/62 mmHg 174/80 mmHg 154/82 mmHg 164/74 mmHg 130/70 mmHg   Blood Pressure (Exit) 122/72 mmHg 104/60 mmHg 110/60 mmHg 96/54 mmHg  recheck 114/60 126/66 mmHg   Heart Rate (Admit) 79 bpm 86 bpm 89 bpm 93 bpm 95 bpm   Heart Rate (Exercise) 117 bpm 111 bpm 128 bpm 118 bpm 90 bpm   Heart Rate (Exit) 89 bpm 80 bpm 94 bpm 99 bpm 97 bpm   Oxygen Saturation (Admit) 96 % 96 % 96 % 93 % 89 %   Oxygen Saturation (Exercise) 91 % 90 % 90 % 87 % 129 %   Oxygen Saturation (Exit) 96 % 95 % 96 % 97 % 109 %   Rating of Perceived Exertion (Exercise) 14 13 13 13 11    Perceived Dyspnea (Exercise) 2 1 2 2 1    Duration  Progress to 30 minutes of continuous aerobic without signs/symptoms of physical distress Progress to 45 minutes of aerobic exercise without signs/symptoms of physical distress Progress to 45 minutes of aerobic exercise without signs/symptoms of physical distress  Progress to 45 minutes of aerobic exercise without signs/symptoms of physical distress   Intensity THRR unchanged THRR unchanged THRR unchanged THRR unchanged THRR unchanged   Progression   Progression  Continue progressive overload as per policy without signs/symptoms or physical distress. Continue to progress workloads to maintain intensity without signs/symptoms of physical distress. Continue to progress workloads to maintain intensity without signs/symptoms of physical distress.    Resistance Training   Training Prescription Yes Yes Yes Yes Yes   Weight orange bands orange bands orange bands  orange bands orange bands orange bands   Reps 8-10 8-10 10-12 10-12 10-12   Interval Training   Interval Training No No No No No   Treadmill   MPH 2.5  2.5 2.5 2.5   Grade 1  2 2 2    Minutes 15  15 15 15    Recumbant Bike   Level 3 1 3 3 3    Watts 12       Minutes  15 15 15 15 15    NuStep   Level 1 1 2  -- 2   METs 1.4 1.6 1.9 -- 2.1     02/17/16 1230 02/17/16 1300 02/22/16 1200 02/24/16 1200 02/29/16 1200   Exercise Review   Progression Yes    Yes   Response to Exercise   Blood Pressure (Admit) 136/79 mmHg  122/60 mmHg 114/60 mmHg 102/60 mmHg   Blood Pressure (Exercise)   144/80 mmHg 130/60 mmHg 146/72 mmHg   Blood Pressure (Exit) 118/64 mmHg  106/50 mmHg 116/64 mmHg 112/66 mmHg   Heart Rate (Admit) 79 bpm  78 bpm 90 bpm 89 bpm   Heart Rate (Exercise) 111 bpm  116 bpm 122 bpm 124 bpm   Heart Rate (Exit) 93 bpm  95 bpm 98 bpm 95 bpm   Oxygen Saturation (Admit) 95 %  98 % 96 % 97 %   Oxygen Saturation (Exercise) 95 %  91 % 90 % 90 %   Oxygen Saturation (Exit) 96 %  97 % 97 % 96 %   Rating of Perceived Exertion (Exercise) 11  13  13    Perceived Dyspnea (Exercise) 0  2  2   Comments  --  reviewed home exercise prescription      Duration Progress to 45 minutes of aerobic exercise without signs/symptoms of physical distress  Progress to 45 minutes of aerobic exercise without signs/symptoms of  physical distress Progress to 45 minutes of aerobic exercise without signs/symptoms of physical distress Progress to 45 minutes of aerobic exercise without signs/symptoms of physical distress   Intensity THRR unchanged  THRR unchanged THRR unchanged THRR unchanged   Progression   Progression Continue to progress workloads to maintain intensity without signs/symptoms of physical distress.  Continue to progress workloads to maintain intensity without signs/symptoms of physical distress. Continue to progress workloads to maintain intensity without signs/symptoms of physical distress. Continue to progress workloads to maintain intensity without signs/symptoms of physical distress.   Resistance Training   Training Prescription Yes  Yes Yes Yes   Weight orange bands  orange bands orange bands BLUE   Reps 10-12  10-12 10-12 10-12   Interval Training   Interval Training No  No No No   Treadmill   MPH   2 2.5 2.5   Grade   1 1 1    Minutes   15 15 15    Recumbant Bike   Level   3 3 3    Minutes   15 15 15    NuStep   Level 4  4 4 5    Minutes     15   METs 2.4  2.4 2.4 2.4   Home Exercise Plan   Plans to continue exercise at  Home      Frequency  Add 3 additional days to program exercise sessions.        03/07/16 1200 03/09/16 1300 03/14/16 1200 03/16/16 1200     Exercise Review   Progression   Yes     Response to Exercise   Blood Pressure (Admit) 112/54 mmHg 120/60 mmHg 120/56 mmHg 108/50 mmHg    Blood Pressure (Exercise) 120/68 mmHg 160/72 mmHg 138/60 mmHg 132/70 mmHg    Blood Pressure (Exit) 122/60 mmHg 106/50 mmHg 99/64 mmHg 120/62 mmHg    Heart Rate (Admit) 88 bpm 87 bpm 82 bpm 76 bpm    Heart Rate (Exercise) 117 bpm 113 bpm 108 bpm 114 bpm    Heart Rate (Exit) 94 bpm 100 bpm 89 bpm 95 bpm  Oxygen Saturation (Admit) 95 % 97 % 97 % 96 %    Oxygen Saturation (Exercise) 89 % 92 % 90 % 93 %    Oxygen Saturation (Exit) 94 % 97 % 97 % 96 %    Rating of Perceived Exertion (Exercise) 11 13 12  13     Perceived Dyspnea (Exercise) 1 2 2 3     Duration Progress to 45 minutes of aerobic exercise without signs/symptoms of physical distress Progress to 45 minutes of aerobic exercise without signs/symptoms of physical distress Progress to 45 minutes of aerobic exercise without signs/symptoms of physical distress Progress to 45 minutes of aerobic exercise without signs/symptoms of physical distress    Intensity THRR unchanged THRR unchanged THRR unchanged THRR unchanged    Progression   Progression Continue to progress workloads to maintain intensity without signs/symptoms of physical distress. Continue to progress workloads to maintain intensity without signs/symptoms of physical distress. Continue to progress workloads to maintain intensity without signs/symptoms of physical distress. Continue to progress workloads to maintain intensity without signs/symptoms of physical distress.    Resistance Training   Training Prescription Yes Yes Yes Yes    Weight BLUE BLUE BLUE BLUE    Reps 10-12 10-12 10-12 10-12    Interval Training   Interval Training No No No No    Treadmill   MPH 2.5 2.5 2.5 2.5    Grade 1 1 1 1     Minutes 15 15 15 15     Recumbant Bike   Level 3 3 3      Minutes 15 15 15      NuStep   Level 5  6 6     Minutes 15  15 15     METs 1.9  2.5 2.7       Exercise Comments:     Exercise Comments      02/17/16 0841 03/16/16 0856         Exercise Comments Patient is cont. to progress exercise tolerance. Will discuss home exercise prescription.  Patient is increasing intensity on Nustep. He is now on level 6. Will continue to monitor and progress as appropriate.         Discharge Exercise Prescription (Final Exercise Prescription Changes):     Exercise Prescription Changes - 03/16/16 1200    Response to Exercise   Blood Pressure (Admit) 108/50 mmHg   Blood Pressure (Exercise) 132/70 mmHg   Blood Pressure (Exit) 120/62 mmHg   Heart Rate (Admit) 76 bpm   Heart Rate  (Exercise) 114 bpm   Heart Rate (Exit) 95 bpm   Oxygen Saturation (Admit) 96 %   Oxygen Saturation (Exercise) 93 %   Oxygen Saturation (Exit) 96 %   Rating of Perceived Exertion (Exercise) 13   Perceived Dyspnea (Exercise) 3   Duration Progress to 45 minutes of aerobic exercise without signs/symptoms of physical distress   Intensity THRR unchanged   Progression   Progression Continue to progress workloads to maintain intensity without signs/symptoms of physical distress.   Resistance Training   Training Prescription Yes   Weight BLUE   Reps 10-12   Interval Training   Interval Training No   Treadmill   MPH 2.5   Grade 1   Minutes 15   NuStep   Level 6   Minutes 15   METs 2.7       Nutrition:  Target Goals: Understanding of nutrition guidelines, daily intake of sodium 1500mg , cholesterol 200mg , calories 30% from fat and 7% or less from saturated fats, daily to  have 5 or more servings of fruits and vegetables.  Biometrics:     Pre Biometrics - 01/24/16 1307    Pre Biometrics   Grip Strength 37 kg       Nutrition Therapy Plan and Nutrition Goals:     Nutrition Therapy & Goals - 02/22/16 1412    Nutrition Therapy   Diet High Calorie, High Protein   Personal Nutrition Goals   Personal Goal #1 0.5-2 lb wt gain to a goal wt of 124-130 lb at graduation from [place], educate and counsel regarding individualized specific dietary modifications aiming towards targeted core components such as weight, hypertension, lipid management, diabetes, heart failure and other comorbidities.;Nutrition handout(s) given to patient.  High Calorie, High Protein diet, recipes, and suggestions for increasing calories and protein given   Expected Outcomes Short Term Goal: Understand basic principles of dietary content, such as calories, fat, sodium, cholesterol and nutrients.;Long Term Goal: Adherence to prescribed nutrition plan.       Nutrition Discharge: Rate Your Plate Scores:     Nutrition Assessments - 01/28/16 1119    Rate Your Plate Scores   Pre Score 45      Psychosocial: Target Goals: Acknowledge presence or absence of depression, maximize coping skills, provide positive support system. Participant is able to verbalize types and ability to use techniques and skills needed for reducing stress and depression.  Initial Review & Psychosocial Screening:     Initial Psych Review & Screening - 01/24/16 [place]? Yes   Barriers   Psychosocial barriers to participate in program There are no identifiable barriers or psychosocial needs.   Screening Interventions   Interventions Encouraged to exercise      Quality of Life Scores:     Quality of Life - 01/25/16 1145    Quality of Life Scores   Health/Function Pre 22.2 %   Socioeconomic Pre 24.13 %   Psych/Spiritual Pre 20.93 %   Family Pre 25.67 %   GLOBAL Pre 22.17 %      PHQ-9:     Recent Review Flowsheet Data    Depression screen [facility] 2/9 01/24/2016   Decreased Interest 0   Down, Depressed, Hopeless 0   PHQ - 2 Score 0      Psychosocial Evaluation and Intervention:     Psychosocial Evaluation - 01/24/16 1316    Psychosocial Evaluation & Interventions   Interventions Encouraged to exercise with the program and follow exercise prescription   Continued Psychosocial Services Needed No      Psychosocial Re-Evaluation:     Psychosocial Re-Evaluation      02/17/16 0808 03/16/16 0842         Psychosocial Re-Evaluation   Interventions Encouraged to attend Pulmonary Rehabilitation for the exercise Encouraged to attend Pulmonary Rehabilitation for the exercise        Education: Education Goals: Education classes will be provided on a weekly basis, covering required topics. Participant will state understanding/return demonstration of topics presented.  Learning Barriers/Preferences:      Learning Barriers/Preferences - 01/24/16 1259    Learning Barriers/Preferences   Learning Barriers None   Learning Preferences Skilled Demonstration      Education Topics: Risk Factor Reduction:  -Group instruction that is supported by a PowerPoint presentation. Instructor discusses the definition of a risk factor, different risk factors for pulmonary disease, and how the heart and lungs work together.  Nutrition for Pulmonary Patient:  -Group instruction provided by PowerPoint slides, verbal discussion, and written materials to support subject matter. The instructor gives an explanation and review of healthy diet recommendations, which includes a discussion on weight management, recommendations for fruit and vegetable consumption, as well as protein, fluid, caffeine, fiber, sodium, sugar, and alcohol. Tips for eating when patients are short of breath are discussed.   Pursed Lip Breathing:  -Group instruction that is supported by demonstration and informational handouts. Instructor discusses the benefits of pursed lip and diaphragmatic breathing and detailed demonstration on how to preform both.            PULMONARY REHAB OTHER RESPIRATORY from 03/16/2016 in [place]   Date  02/10/16   Educator  RT   Instruction Review Code  2- meets goals/outcomes      Oxygen Safety:  -Group instruction provided by PowerPoint, verbal discussion, and written material to support subject matter. There is an overview of "What is Oxygen" and "Why do we need it".  Instructor also reviews how to create a safe environment for oxygen use, the importance of using oxygen as prescribed, and the risks of noncompliance. There is a brief discussion on traveling with oxygen and resources the patient may utilize.      PULMONARY REHAB OTHER RESPIRATORY from 03/16/2016 in [place]   Date  02/03/16   Educator  Trish Fountain   Instruction Review Code   2- meets goals/outcomes      Oxygen Equipment:  -Group instruction provided by [facility] Staff utilizing handouts, written materials, and equipment demonstrations.      PULMONARY REHAB OTHER RESPIRATORY from 03/16/2016 in [place]   Date  02/17/16   Educator  Ace Gins rep   Instruction Review Code  2- meets goals/outcomes      Signs and Symptoms:  -Group instruction provided by written material and verbal discussion to support subject matter. Warning signs and symptoms of infection, stroke, and heart attack are reviewed and when to call the physician/911 reinforced. Tips for preventing the spread of infection discussed.   Advanced Directives:  -Group instruction provided by verbal instruction and written material to support subject matter. Instructor reviews Advanced Directive laws and proper instruction for filling out document.      PULMONARY REHAB OTHER RESPIRATORY from 03/16/2016 in [place]   Date  03/16/16   Educator  Mikki Santee [place]   Instruction Review Code  2- meets goals/outcomes      Pulmonary Video:  -Group video education that reviews the importance of medication and oxygen compliance, exercise, good nutrition, pulmonary hygiene, and pursed lip and diaphragmatic breathing for the pulmonary patient.   Exercise for the Pulmonary Patient:  -Group instruction that is supported by a PowerPoint presentation. Instructor discusses benefits of exercise, core components of exercise, frequency, duration, and intensity of an exercise routine, importance of utilizing pulse oximetry during exercise, safety while exercising, and options of places to exercise outside of rehab.        PULMONARY REHAB OTHER RESPIRATORY from 03/16/2016 in [place]   Date  03/02/16   Educator  ep   Instruction Review Code  2- meets goals/outcomes      Pulmonary Medications:  -Verbally interactive group  education provided by instructor with focus on inhaled medications and proper administration.      PULMONARY REHAB OTHER RESPIRATORY from 03/16/2016 in [place]   Date  02/10/16   Educator  RT   Instruction Review Code  2- meets goals/outcomes      Anatomy and Physiology of the Respiratory System and Intimacy:  -Group instruction provided by PowerPoint, verbal discussion, and written material to support subject matter. Instructor reviews respiratory cycle and anatomical components of the respiratory system and their functions. Instructor also reviews differences in obstructive and restrictive respiratory diseases with examples of each. Intimacy, Sex, and Sexuality differences are reviewed with a discussion on how relationships can change when diagnosed with pulmonary disease. Common sexual concerns are reviewed.      PULMONARY REHAB OTHER RESPIRATORY from 03/16/2016 in [place]   Date  03/09/16   Educator  Trish Fountain   Instruction Review Code  2- meets goals/outcomes      Knowledge Questionnaire Score:     Knowledge Questionnaire Score - 01/25/16 1144    Knowledge Questionnaire Score   Pre Score 9/13      Core Components/Risk Factors/Patient Goals at Admission:     Personal Goals and Risk Factors at Admission - 01/24/16 1308    Core Components/Risk Factors/Patient Goals on Admission   Increase Strength and Stamina Yes   Intervention Provide advice, education, support and counseling about physical activity/exercise needs.;Develop an individualized exercise prescription for aerobic and resistive training based on initial evaluation findings, risk stratification, comorbidities and participant's personal goals.   Expected Outcomes Achievement of increased cardiorespiratory fitness and enhanced flexibility, muscular endurance and strength shown through measurements of functional capacity and personal statement of  participant.   Improve shortness of breath with ADL's Yes   Intervention Provide education, individualized exercise plan and daily activity instruction to help decrease symptoms of SOB with activities of daily living.   Expected Outcomes Short Term: Achieves a reduction of symptoms when performing activities of daily living.   Develop more efficient breathing techniques such as purse lipped breathing and diaphragmatic breathing; and practicing self-pacing with activity Yes   Intervention Provide education, demonstration and support about specific breathing techniuqes utilized for more efficient breathing. Include techniques such as pursed lipped breathing, diaphragmatic breathing and self-pacing activity.   Expected Outcomes Short Term: Participant will be able to demonstrate and use breathing techniques as needed throughout daily activities.      Core Components/Risk Factors/Patient Goals Review:      Goals and Risk Factor Review      01/24/16 1313 02/17/16 0809 03/16/16 0842       Core Components/Risk Factors/Patient Goals Review   Personal Goals Review Increase Strength and Stamina;Develop more efficient breathing techniques such as purse lipped breathing and diaphragmatic breathing and practicing self-pacing with activity.;Improve shortness of breath with ADL's Increase Strength and Stamina;Develop more efficient breathing techniques such as purse lipped breathing and diaphragmatic breathing and practicing self-pacing with activity.;Improve shortness of breath with ADL's Increase Strength and Stamina;Develop more efficient breathing techniques such as purse lipped breathing and diaphragmatic breathing and practicing self-pacing with activity.;Improve shortness of breath with ADL's     Review purse lip breathing, increase workloads on exercise equipment,  see "comments" section on ITP see "comments" section on ITP     Expected Outcomes more strength and stamina with ADL's, purse lip breathing  without cuing and develope more efficient breathing see Admission expected outcomes see Admission expected outcomes        Core Components/Risk Factors/Patient Goals at Discharge (Final Review):      Goals and Risk Factor Review -  03/16/16 0842    Core Components/Risk Factors/Patient Goals Review   Personal Goals Review Increase Strength and Stamina;Develop more efficient breathing techniques such as purse lipped breathing and diaphragmatic breathing and practicing self-pacing with activity.;Improve shortness of breath with ADL's   Review see "comments" section on ITP   Expected Outcomes see Admission expected outcomes      ITP Comments:   Comments: ITP REVIEW Pt is making expected progress toward personal goals after completing 14 sessions. Recommend continued exercise, life style modification, education, and utilization of breathing techniques to increase stamina and strength and decrease shortness of breath with exertion.

## 2016-03-16 NOTE — Progress Notes (Signed)
Daily Session Note  Patient Details  Name: William Lee MRN: [id number] Date of Birth: Mar 13, 1946 Referring Provider:    Encounter Date: 03/16/2016  Check In:     Session Check In - 03/16/16 0959    Check-In   [facility]   Staff Present Rosebud Poles, RN, BSN;Kailani Brass, MS, ACSM RCEP, Exercise Physiologist;Portia Rollene Rotunda, Therapist, sports, BSN;Ramon Dredge, RN, [facility]   Supervising physician immediately available to respond to emergencies [facility] immediately available   Physician(s) Dr. Waldron Labs   Medication changes reported     No   Fall or balance concerns reported    No   Warm-up and Cool-down Performed as group-led instruction   Resistance Training Performed Yes   VAD Patient? No   Pain Assessment   Currently in Pain? No/denies   Multiple Pain Sites No      Capillary Blood Glucose: No results found for this or any previous visit (from the past 24 hour(s)).      Exercise Prescription Changes - 03/16/16 1200    Response to Exercise   Blood Pressure (Admit) 108/50 mmHg   Blood Pressure (Exercise) 132/70 mmHg   Blood Pressure (Exit) 120/62 mmHg   Heart Rate (Admit) 76 bpm   Heart Rate (Exercise) 114 bpm   Heart Rate (Exit) 95 bpm   Oxygen Saturation (Admit) 96 %   Oxygen Saturation (Exercise) 93 %   Oxygen Saturation (Exit) 96 %   Rating of Perceived Exertion (Exercise) 13   Perceived Dyspnea (Exercise) 3   Duration Progress to 45 minutes of aerobic exercise without signs/symptoms of physical distress   Intensity THRR unchanged   Progression   Progression Continue to progress workloads to maintain intensity without signs/symptoms of physical distress.   Resistance Training   Training Prescription Yes   Weight BLUE   Reps 10-12   Interval Training   Interval Training No   Treadmill   MPH 2.5   Grade 1   Minutes 15   NuStep   Level 6   Minutes 15   METs 2.7     Goals Met:  Exercise tolerated well No report of cardiac  concerns or symptoms Strength training completed today  Goals Unmet:  Not Applicable  Comments: Service time is from 10:30am to 12:20pm    Dr. Rush Farmer is Medical Director for Pulmonary Rehab at [facility].

## 2016-03-21 ENCOUNTER — Encounter (HOSPITAL_COMMUNITY)
Admission: RE | Admit: 2016-03-21 | Discharge: 2016-03-21 | Disposition: A | Discharge status: Home / Self Care | Payer: Medicare Other | Source: Ambulatory Visit | Attending: Pulmonary Disease | Admitting: Pulmonary Disease

## 2016-03-21 VITALS — Wt 125.7 lb

## 2016-03-21 DIAGNOSIS — R0600 Dyspnea, unspecified: Secondary | ICD-10-CM

## 2016-03-21 NOTE — Progress Notes (Signed)
Daily Session Note  Patient Details  Name: William Lee MRN: [id number] Date of Birth: 1946-03-22 Referring Provider:    Encounter Date: 03/21/2016  Check In:     Session Check In - 03/21/16 1225    Check-In   [facility]   Staff Present Trish Fountain, RN, BSN;Lisa Ysidro Evert, RN;Molly diVincenzo, MS, ACSM RCEP, Exercise Physiologist   Supervising physician immediately available to respond to emergencies [facility] immediately available   Physician(s) Dr. Marily Memos   Medication changes reported     No   Fall or balance concerns reported    No   Warm-up and Cool-down Performed as group-led instruction   Resistance Training Performed Yes   VAD Patient? No   Pain Assessment   Currently in Pain? No/denies   Multiple Pain Sites No      Capillary Blood Glucose: No results found for this or any previous visit (from the past 24 hour(s)).      Exercise Prescription Changes - 03/21/16 1225    Response to Exercise   Blood Pressure (Admit) 122/60 mmHg   Blood Pressure (Exercise) 144/70 mmHg   Blood Pressure (Exit) 126/66 mmHg   Heart Rate (Admit) 88 bpm   Heart Rate (Exercise) 113 bpm   Heart Rate (Exit) 98 bpm   Oxygen Saturation (Admit) 96 %   Oxygen Saturation (Exercise) 90 %   Oxygen Saturation (Exit) 98 %   Rating of Perceived Exertion (Exercise) 13   Perceived Dyspnea (Exercise) 3   Duration Progress to 45 minutes of aerobic exercise without signs/symptoms of physical distress   Intensity THRR unchanged   Progression   Progression Continue to progress workloads to maintain intensity without signs/symptoms of physical distress.   Resistance Training   Training Prescription Yes   Weight BLUE   Reps 10-12   Interval Training   Interval Training No   Treadmill   MPH 2.5   Grade 1   Minutes 15   Recumbant Bike   Level 3   Minutes 15   NuStep   Level 6   Minutes 15   METs 2.5     Goals Met:  Improved SOB with ADL's Using PLB without  cueing & demonstrates good technique Exercise tolerated well No report of cardiac concerns or symptoms Strength training completed today  Goals Unmet:  Not Applicable  Comments: Service time is from 1030 to 1210   Dr. Rush Farmer is Medical Director for Pulmonary Rehab at [facility].

## 2016-03-22 ENCOUNTER — Encounter: Payer: Self-pay | Admitting: Vascular Surgery

## 2016-03-23 ENCOUNTER — Encounter (HOSPITAL_COMMUNITY)
Admission: RE | Admit: 2016-03-23 | Discharge: 2016-03-23 | Disposition: A | Discharge status: Home / Self Care | Payer: Medicare Other | Source: Ambulatory Visit | Attending: Pulmonary Disease | Admitting: Pulmonary Disease

## 2016-03-23 VITALS — Wt 125.9 lb

## 2016-03-23 DIAGNOSIS — R0600 Dyspnea, unspecified: Secondary | ICD-10-CM | POA: Diagnosis not present

## 2016-03-23 NOTE — Progress Notes (Signed)
Daily Session Note  Patient Details  Name: FRITZ CAUTHON MRN: [id number] Date of Birth: 02-12-1946 Referring Provider:    Encounter Date: 03/23/2016  Check In:     Session Check In - 03/23/16 1014    Check-In   [facility]   Staff Present Su Hilt, MS, ACSM RCEP, Exercise Physiologist;Joan Leonia Reeves, RN, Luisa Hart, RN, Roque Cash, RN   Supervising physician immediately available to respond to emergencies [facility] immediately available   Physician(s) Dr. Eliseo Squires   Medication changes reported     No   Fall or balance concerns reported    No   Warm-up and Cool-down Performed as group-led instruction   Resistance Training Performed Yes   VAD Patient? No   Pain Assessment   Currently in Pain? No/denies   Multiple Pain Sites No      Capillary Blood Glucose: No results found for this or any previous visit (from the past 24 hour(s)).      Exercise Prescription Changes - 03/23/16 1200    Response to Exercise   Blood Pressure (Admit) 120/56 mmHg   Blood Pressure (Exercise) 162/74 mmHg   Blood Pressure (Exit) 110/54 mmHg   Heart Rate (Admit) 85 bpm   Heart Rate (Exercise) 106 bpm   Heart Rate (Exit) 95 bpm   Oxygen Saturation (Admit) 95 %   Oxygen Saturation (Exercise) 94 %   Oxygen Saturation (Exit) 96 %   Rating of Perceived Exertion (Exercise) 13   Perceived Dyspnea (Exercise) 2   Duration Progress to 45 minutes of aerobic exercise without signs/symptoms of physical distress   Intensity THRR unchanged   Progression   Progression Continue to progress workloads to maintain intensity without signs/symptoms of physical distress.   Resistance Training   Training Prescription Yes   Weight blue bands   Reps 10-12   Interval Training   Interval Training No   Recumbant Bike   Level 3   Minutes 15   NuStep   Level 6   Minutes 15   METs 2.1     Goals Met:  Exercise tolerated well No report of cardiac concerns or  symptoms Strength training completed today  Goals Unmet:  Not Applicable  Comments: Service time is from 1030 to 1200    Dr. Rush Farmer is Medical Director for Pulmonary Rehab at [facility].

## 2016-03-24 ENCOUNTER — Other Ambulatory Visit: Payer: Self-pay

## 2016-03-24 DIAGNOSIS — I739 Peripheral vascular disease, unspecified: Secondary | ICD-10-CM

## 2016-03-28 ENCOUNTER — Encounter (HOSPITAL_COMMUNITY)
Admission: RE | Admit: 2016-03-28 | Discharge: 2016-03-28 | Disposition: A | Discharge status: Home / Self Care | Payer: Medicare Other | Source: Ambulatory Visit | Attending: Pulmonary Disease | Admitting: Pulmonary Disease

## 2016-03-28 VITALS — Wt 125.7 lb

## 2016-03-28 DIAGNOSIS — R0600 Dyspnea, unspecified: Secondary | ICD-10-CM | POA: Diagnosis not present

## 2016-03-28 NOTE — Progress Notes (Addendum)
Daily Session Note  Patient Details  Name: William Lee MRN: [id number] Date of Birth: 01/04/1946 Referring Provider:    Encounter Date: 03/28/2016  Check In:     Session Check In - 03/28/16 1010    Check-In   [facility]   Staff Present Molly diVincenzo, MS, ACSM RCEP, Exercise Physiologist;Joan Behrens, RN, BSN;Portia Payne, RN, BSN   Supervising physician immediately available to respond to emergencies [facility] immediately available   Physician(s) Dr. Merrell   Medication changes reported     No   Fall or balance concerns reported    No   Warm-up and Cool-down Performed as group-led instruction   Resistance Training Performed Yes   VAD Patient? No   Pain Assessment   Currently in Pain? No/denies   Multiple Pain Sites No      Capillary Blood Glucose: No results found for this or any previous visit (from the past 24 hour(s)).      Exercise Prescription Changes - 03/28/16 1224    Response to Exercise   Blood Pressure (Admit) 112/60 mmHg   Blood Pressure (Exercise) 140/66 mmHg   Blood Pressure (Exit) 130/64 mmHg   Heart Rate (Admit) 90 bpm   Heart Rate (Exercise) 114 bpm   Heart Rate (Exit) 93 bpm   Oxygen Saturation (Admit) 94 %   Oxygen Saturation (Exercise) 88 %   Oxygen Saturation (Exit) 96 %   Rating of Perceived Exertion (Exercise) 13   Perceived Dyspnea (Exercise) 2   Duration Progress to 45 minutes of aerobic exercise without signs/symptoms of physical distress   Intensity THRR unchanged   Progression   Progression Continue to progress workloads to maintain intensity without signs/symptoms of physical distress.   Resistance Training   Training Prescription Yes   Weight blue bands   Reps 10-12   Interval Training   Interval Training No   Treadmill   MPH 2  pt changed workload an c/o dizziness. removed from TM   Grade 1   Minutes 3   Recumbant Bike   Level 2   pt decreased workload r/t SOB and cough.   Minutes 15    NuStep   Level 6   Minutes 15   METs 2.5   Track   Laps 9   Minutes 12     Goals Met:  Independence with exercise equipment No report of cardiac concerns or symptoms Strength training completed today  Goals Unmet:  PD, Giancarlos decreased his workloads on the treadmill and RB for c/o dyspnea and cough over the last 2 days. He has restarted his mucinex. He is encouraged to seek medical attention if his symptoms worsen or he is no better in 24 hours. He complained of "slight dizziness" on the treadmill after 3 min. Vitals stable. Patient switched to ambulating on the track and walked 9 laps without symptoms.  Comments: Service time is from 1030 to 1205   Dr. Wesam G. Yacoub is Medical Director for Pulmonary Rehab at [facility]. 

## 2016-03-29 ENCOUNTER — Ambulatory Visit (HOSPITAL_COMMUNITY)
Admission: RE | Admit: 2016-03-29 | Discharge: 2016-03-29 | Disposition: A | Discharge status: Home / Self Care | Payer: Medicare Other | Source: Ambulatory Visit | Attending: Vascular Surgery | Admitting: Vascular Surgery

## 2016-03-29 ENCOUNTER — Other Ambulatory Visit: Payer: Self-pay

## 2016-03-29 ENCOUNTER — Encounter: Payer: Self-pay | Admitting: Vascular Surgery

## 2016-03-29 ENCOUNTER — Ambulatory Visit (INDEPENDENT_AMBULATORY_CARE_PROVIDER_SITE_OTHER): Payer: Medicare Other | Admitting: Vascular Surgery

## 2016-03-29 VITALS — BP 140/76 | HR 90 | Temp 97.7°F | Resp 16 | Ht 62.0 in | Wt 124.0 lb

## 2016-03-29 DIAGNOSIS — E785 Hyperlipidemia, unspecified: Secondary | ICD-10-CM | POA: Insufficient documentation

## 2016-03-29 DIAGNOSIS — I10 Essential (primary) hypertension: Secondary | ICD-10-CM | POA: Diagnosis not present

## 2016-03-29 DIAGNOSIS — I739 Peripheral vascular disease, unspecified: Secondary | ICD-10-CM | POA: Diagnosis not present

## 2016-03-29 DIAGNOSIS — K219 Gastro-esophageal reflux disease without esophagitis: Secondary | ICD-10-CM | POA: Diagnosis not present

## 2016-03-29 DIAGNOSIS — R0989 Other specified symptoms and signs involving the circulatory and respiratory systems: Secondary | ICD-10-CM | POA: Diagnosis present

## 2016-03-29 NOTE — Progress Notes (Signed)
[facility] [place]  Patient name: William Lee MRN: [id number] DOB: 07/30/1946 Sex: male  REASON FOR CONSULT: Left lower extremity intermittent claudication  HPI: William Lee is a 70 y.o. male, who is patient is very pleasant active 70 year old gentleman with her clear-cut history of lower extremity intermittent claudication. He reports this is been progressive over several years. It does limit him to some degree from the active lifestyle that he likes to pursue. He does walk on a treadmill and also walks for enjoyment and is limited by this. He has no arterial rest pain and no tissue loss. 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 No history of DVT.  Past Medical History  Diagnosis Date  . Ischemic heart disease   . MI (myocardial infarction) ([place])   . COPD, severe   . Papilloma of larynx   . Chest pain   . SOB (shortness of breath) on exertion   . Dyslipidemia   . COPD (chronic obstructive pulmonary disease) ([place])   . Hyperlipidemia   . Hypertension   . GERD (gastroesophageal reflux disease)   . Asthma   . PONV (postoperative nausea and vomiting)   . left Inguinal hernia 02/01/2012    Repaired 05/01/12   . 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 Coronary artery disease     Family History  Problem Relation Age of Onset  . Liver cancer Mother   . Bone cancer Father   . Lung cancer Sister   . Cancer Brother     unknown    SOCIAL HISTORY: Social History   Social History  . Marital Status: Married    Spouse Name: N/A  . Number of Children: N/A  . Years of Education: N/A   Occupational History  . Not on file.   Social History Main Topics  . Smoking status: Former Smoker    Quit date: 10/30/1990  . Smokeless tobacco: Not on file  . Alcohol Use: No  . Drug Use: Yes    Special: Marijuana  . Sexual Activity: Not on file   Other  Topics Concern  . Not on file   Social History Narrative    Allergies  Allergen Reactions  . Silver Rash    Pulls skin off when removed  . Tape Rash    Also cant use tegaderm.    Current Outpatient Prescriptions  Medication Sig Dispense Refill  . albuterol (PROVENTIL HFA;VENTOLIN HFA) 108 (90 BASE) MCG/ACT inhaler Inhale 2 puffs into the lungs every 6 (six) hours as needed for wheezing.    Marland Kitchen albuterol (PROVENTIL) (5 MG/ML) 0.5% nebulizer solution Take 2.5 mg by nebulization every 6 (six) hours as needed for wheezing or shortness of breath.     Marland Kitchen aspirin 81 MG tablet Take 81 mg by mouth daily.    Marland Kitchen atorvastatin (LIPITOR) 40 MG tablet Take 20 mg by mouth daily as needed.    . budesonide-formoterol (SYMBICORT) 160-4.5 MCG/ACT inhaler Inhale 2 puffs into the lungs 2 (two) times daily.    . chlorpheniramine-HYDROcodone (TUSSIONEX) 10-8 MG/5ML SUER Take 5 mLs by mouth daily as needed (cough).     . cyclobenzaprine (FLEXERIL) 10 MG tablet Take 10 mg by mouth 2 (two) times daily as needed for muscle spasms.     Marland Kitchen diltiazem (CARDIZEM CD) 120 MG 24 hr capsule Take 120 mg by mouth at  bedtime.    . fluticasone (FLONASE) 50 MCG/ACT nasal spray Place 2 sprays into the nose daily as needed for rhinitis.     Marland Kitchen omeprazole (PRILOSEC) 20 MG capsule Take 40 mg by mouth 2 (two) times daily. Take 2 capsules in the morning and 2 capsules in the evening    . potassium chloride 20 MEQ/15ML (10%) SOLN Take 20 mEq by mouth 2 (two) times daily.     Marland Kitchen tiotropium (SPIRIVA) 18 MCG inhalation capsule Place 18 mcg into inhaler and inhale daily.    . Tiotropium Bromide Monohydrate (SPIRIVA RESPIMAT) 2.5 MCG/ACT AERS Take 2 puffs first thing in am and then another 2 puffs about 12 hours later. 1 Inhaler 11  . traMADol (ULTRAM) 50 MG tablet Take 50 mg by mouth every 6 (six) hours as needed for pain. Reported on 01/24/2016    . triamterene-hydrochlorothiazide (MAXZIDE-25) 37.5-25 MG per tablet Take 1 tablet by mouth daily.  Reported on 01/24/2016     No current facility-administered medications for this visit.    REVIEW OF SYSTEMS:  [X]  denotes positive finding, [ ]  denotes negative finding Cardiac  Comments:  Chest pain or chest pressure:    Shortness of breath upon exertion:    Short of breath when lying flat:    Irregular heart rhythm:        Vascular    Pain in calf, thigh, or hip brought on by ambulation: x   Pain in feet at night that wakes you up from your sleep:     Blood clot in your veins:    Leg swelling:         Pulmonary    Oxygen at home:    Productive cough:  x   Wheezing:         Neurologic    Sudden weakness in arms or legs:     Sudden numbness in arms or legs:     Sudden onset of difficulty speaking or slurred speech:    Temporary loss of vision in one eye:     Problems with dizziness:         Gastrointestinal    Blood in stool:     Vomited blood:         Genitourinary    Burning when urinating:     Blood in urine:        Psychiatric    Major depression:         Hematologic    Bleeding problems:    Problems with blood clotting too easily:        Skin    Rashes or ulcers:        Constitutional    Fever or chills:      PHYSICAL EXAM: Filed Vitals:   03/29/16 1146  BP: 140/76  Pulse: 90  Temp: 97.7 F (36.5 C)  TempSrc: Oral  Resp: 16  Height: 5\' 2"  (1.575 m)  Weight: 124 lb (56.246 kg)  SpO2: 96%    GENERAL: The patient is a well-nourished male, in no acute distress. The vital signs are documented above. CARDIAC: There is a regular rate and rhythm.  VASCULAR: 2+ radial and 2+ femoral pulses. 2+ right dorsalis pedis pulse. Absent pedal pulses on the left. Does have a palpable left popliteal pulse. PULMONARY: There is good air exchange bilaterally without wheezing or rales. ABDOMEN: Soft and non-tender with normal pitched bowel sounds. No bruits noted MUSCULOSKELETAL: There are no major deformities or cyanosis. NEUROLOGIC: No focal weakness or  paresthesias are detected. SKIN: There are no ulcers or rashes noted. PSYCHIATRIC: The patient has a normal affect.  DATA:  Noninvasive studies today revealed a duplex of his lower extremity showing plaque throughout the common femoral artery superficial femoral artery and popliteal increasing distally. Waveforms are biphasic at the common femoral artery. [facility]invasive [facility] on 02[facility]28[facility]2016 revealed ankle arm index in the left of 0.64 and normal on the right.  MEDICAL ISSUES: Intermittent claudication left leg. Reports this occurs through his buttocks thigh and down into his calf with walking and is relieved with rest. Discussed that this is not limb threatening. With his noninvasive studies and physical exam there is a reasonable chance that this is either left iliac or related to the left femoral bifurcation. Explained that he may be a candidate for stenting for correction of his symptoms. Certainly would not recommend major surgery for correction. Also explained that this potentially could be corrected with a local endarterectomy in the left groin depending on findings. He wished to proceed with arteriography for further evaluation and possible intervention. We will schedule this at his earliest convenience   Trajon Rosete [facility] [employer] [phone]

## 2016-03-30 ENCOUNTER — Encounter (HOSPITAL_COMMUNITY)
Admission: RE | Admit: 2016-03-30 | Discharge: 2016-03-30 | Disposition: A | Discharge status: Home / Self Care | Payer: Medicare Other | Source: Ambulatory Visit | Attending: Pulmonary Disease | Admitting: Pulmonary Disease

## 2016-03-30 VITALS — Wt 124.6 lb

## 2016-03-30 DIAGNOSIS — R0600 Dyspnea, unspecified: Secondary | ICD-10-CM | POA: Diagnosis not present

## 2016-03-30 NOTE — Progress Notes (Signed)
Daily Session Note  Patient Details  Name: William Lee MRN: [id number] Date of Birth: 1945/12/09 Referring Provider:    Encounter Date: 03/30/2016  Check In:     Session Check In - 03/30/16 1043    Check-In   [facility]   Staff Present Rosebud Poles, RN, BSN;Lisa Ysidro Evert, RN;Zeplin Aleshire Rollene Rotunda, RN, BSN;Ramon Dredge, RN, MHA;Molly diVincenzo, MS, ACSM RCEP, Exercise Physiologist   Supervising physician immediately available to respond to emergencies [facility] immediately available   Physician(s) Dr. Renaee Munda   Medication changes reported     No   Fall or balance concerns reported    No   Warm-up and Cool-down Performed as group-led instruction   Resistance Training Performed Yes   VAD Patient? No   Pain Assessment   Currently in Pain? No/denies   Multiple Pain Sites No      Capillary Blood Glucose: No results found for this or any previous visit (from the past 24 hour(s)).      Exercise Prescription Changes - 03/30/16 1340    Response to Exercise   Blood Pressure (Admit) 130/60 mmHg   Blood Pressure (Exercise) 140/68 mmHg   Blood Pressure (Exit) 108/66 mmHg   Heart Rate (Admit) 88 bpm   Heart Rate (Exercise) 111 bpm   Heart Rate (Exit) 88 bpm   Oxygen Saturation (Admit) 93 %   Oxygen Saturation (Exercise) 84 %   Oxygen Saturation (Exit) 95 %   Rating of Perceived Exertion (Exercise) 15   Perceived Dyspnea (Exercise) 3   Duration Progress to 45 minutes of aerobic exercise without signs/symptoms of physical distress   Intensity THRR unchanged   Progression   Progression Continue to progress workloads to maintain intensity without signs/symptoms of physical distress.   Resistance Training   Training Prescription Yes   Weight blue bands   Reps 10-12   Interval Training   Interval Training No   Treadmill   MPH --   Grade --   Minutes --   Recumbant Bike   Level 2   Minutes 15   NuStep   Level 6   Minutes 15   METs 2.3   Track    Laps --   Minutes --     Goals Met:  Queuing for purse lip breathing No report of cardiac concerns or symptoms Strength training completed today  Goals Unmet:  PD O2 Sat, pt complaining of more SOB and cough. MD appointment at 3:00 today. Denies fever/chills.  Comments: Service time is from 1030 to 1240   Dr. Rush Farmer is Medical Director for Pulmonary Rehab at [facility].

## 2016-04-04 ENCOUNTER — Other Ambulatory Visit: Payer: Self-pay

## 2016-04-04 ENCOUNTER — Encounter (HOSPITAL_COMMUNITY): Payer: Medicare Other

## 2016-04-06 ENCOUNTER — Encounter (HOSPITAL_COMMUNITY)
Admission: RE | Admit: 2016-04-06 | Discharge: 2016-04-06 | Disposition: A | Discharge status: Home / Self Care | Payer: Medicare Other | Source: Ambulatory Visit | Attending: Pulmonary Disease | Admitting: Pulmonary Disease

## 2016-04-06 VITALS — Wt 125.0 lb

## 2016-04-06 DIAGNOSIS — R0600 Dyspnea, unspecified: Secondary | ICD-10-CM

## 2016-04-06 NOTE — Progress Notes (Signed)
Daily Session Note  Patient Details  Name: TORRIE LAFAVOR MRN: [id number] Date of Birth: [birth year] Referring Provider:    Encounter Date: 04/06/2016  Check In:     Session Check In - 04/06/16 1004    Check-In   [facility]   Staff Present Rosebud Poles, RN, BSN;Lisa Ysidro Evert, RN;Portia Rollene Rotunda, RN, BSN;Ramon Dredge, RN, MHA;Molly diVincenzo, MS, ACSM RCEP, Exercise Physiologist   Supervising physician immediately available to respond to emergencies [facility] immediately available   Physician(s) Dr. Marily Memos   Medication changes reported     No   Fall or balance concerns reported    No   Warm-up and Cool-down Performed as group-led instruction   Resistance Training Performed Yes   VAD Patient? No   Pain Assessment   Currently in Pain? No/denies   Multiple Pain Sites No      Capillary Blood Glucose: No results found for this or any previous visit (from the past 24 hour(s)).      Exercise Prescription Changes - 04/06/16 1200    Response to Exercise   Blood Pressure (Admit) 124/52 mmHg   Blood Pressure (Exercise) 180/70 mmHg   Blood Pressure (Exit) 110/60 mmHg   Heart Rate (Admit) 87 bpm   Heart Rate (Exercise) 113 bpm   Heart Rate (Exit) 88 bpm   Oxygen Saturation (Admit) 94 %   Oxygen Saturation (Exercise) 87 %   Oxygen Saturation (Exit) 96 %   Rating of Perceived Exertion (Exercise) 12   Perceived Dyspnea (Exercise) 0   Duration Progress to 45 minutes of aerobic exercise without signs/symptoms of physical distress   Intensity THRR unchanged   Progression   Progression Continue to progress workloads to maintain intensity without signs/symptoms of physical distress.   Resistance Training   Training Prescription Yes   Weight blue bands   Reps 10-12   Interval Training   Interval Training No   Recumbant Bike   Level 3   Minutes 15   NuStep   Level 6   Minutes 15   METs 2.1     Goals Met:  Exercise tolerated well Strength  training completed today  Goals Unmet:  Not Applicable  Comments: Service time is from 1030 to 1145    Dr. Rush Farmer is Medical Director for Pulmonary Rehab at [facility].

## 2016-04-11 ENCOUNTER — Encounter (HOSPITAL_COMMUNITY)
Admission: RE | Admit: 2016-04-11 | Discharge: 2016-04-11 | Disposition: A | Discharge status: Home / Self Care | Payer: Medicare Other | Source: Ambulatory Visit | Attending: Pulmonary Disease | Admitting: Pulmonary Disease

## 2016-04-11 VITALS — Wt 123.7 lb

## 2016-04-11 DIAGNOSIS — R0600 Dyspnea, unspecified: Secondary | ICD-10-CM | POA: Diagnosis not present

## 2016-04-11 NOTE — Progress Notes (Signed)
Daily Session Note  Patient Details  Name: William Lee MRN: [id number] Date of Birth: 1946/06/19 Referring Provider:    Encounter Date: 04/11/2016  Check In:     Session Check In - 04/11/16 1117    Check-In   Location [facility]   Staff Present Rosebud Poles, RN, BSN;Lisa Ysidro Evert, RN;Portia Rollene Rotunda, RN, BSN;Ramon Dredge, RN, MHA;Lamberto Dinapoli, MS, ACSM RCEP, Exercise Physiologist   Supervising physician immediately available to respond to emergencies [facility] immediately available   Physician(s) Dr. Marily Memos   Medication changes reported     No   Fall or balance concerns reported    No   Warm-up and Cool-down Performed as group-led instruction   Resistance Training Performed Yes   VAD Patient? No   Pain Assessment   Currently in Pain? No/denies   Multiple Pain Sites No      Capillary Blood Glucose: No results found for this or any previous visit (from the past 24 hour(s)).      Exercise Prescription Changes - 04/11/16 1200    Response to Exercise   Blood Pressure (Admit) 122/70 mmHg   Blood Pressure (Exercise) 154/72 mmHg   Blood Pressure (Exit) 120/60 mmHg   Heart Rate (Admit) 92 bpm   Heart Rate (Exercise) 119 bpm   Heart Rate (Exit) 101 bpm   Oxygen Saturation (Admit) 96 %   Oxygen Saturation (Exercise) 91 %   Oxygen Saturation (Exit) 97 %   Rating of Perceived Exertion (Exercise) 13   Perceived Dyspnea (Exercise) 3   Duration Progress to 45 minutes of aerobic exercise without signs/symptoms of physical distress   Intensity THRR unchanged   Progression   Progression Continue to progress workloads to maintain intensity without signs/symptoms of physical distress.   Resistance Training   Training Prescription Yes   Weight blue bands   Reps 10-12   Interval Training   Interval Training No   Treadmill   MPH 2.5   Grade 1   Minutes 15   Recumbant Bike   Level 3   Minutes 15   NuStep   Level 6   Minutes 15   METs 2.4      Goals Met:  Exercise tolerated well No report of cardiac concerns or symptoms Strength training completed today  Goals Unmet:  Not Applicable  Comments: Service time is from 10:30AM to 12:00PM    Dr. Rush Farmer is Medical Director for Pulmonary Rehab at [facility].

## 2016-04-13 ENCOUNTER — Encounter (HOSPITAL_COMMUNITY)
Admission: RE | Admit: 2016-04-13 | Discharge: 2016-04-13 | Disposition: A | Discharge status: Home / Self Care | Payer: Medicare Other | Source: Ambulatory Visit | Attending: Pulmonary Disease | Admitting: Pulmonary Disease

## 2016-04-13 ENCOUNTER — Encounter (HOSPITAL_COMMUNITY): Payer: Self-pay

## 2016-04-13 NOTE — Progress Notes (Signed)
Pulmonary Individual Treatment Plan  Patient Details  Name: William Lee MRN: [id number] Date of Birth: 1946/05/21 Referring Provider:    Initial Encounter Date:       Pulmonary Rehab Walk Test from 01/25/2016 in [place]   Date  01/25/16      Visit Diagnosis: No diagnosis found.  Patient's Home Medications on Admission:   Current outpatient prescriptions:  .  albuterol (PROVENTIL HFA;VENTOLIN HFA) 108 (90 BASE) MCG/ACT inhaler, Inhale 2 puffs into the lungs every 6 (six) hours as needed for wheezing., Disp: , Rfl:  .  albuterol (PROVENTIL) (5 MG/ML) 0.5% nebulizer solution, Take 2.5 mg by nebulization every 6 (six) hours as needed for wheezing or shortness of breath. , Disp: , Rfl:  .  aspirin 81 MG tablet, Take 81 mg by mouth daily., Disp: , Rfl:  .  atorvastatin (LIPITOR) 40 MG tablet, Take 20 mg by mouth daily. , Disp: , Rfl:  .  budesonide-formoterol (SYMBICORT) 160-4.5 MCG/ACT inhaler, Inhale 2 puffs into the lungs 2 (two) times daily., Disp: , Rfl:  .  cyclobenzaprine (FLEXERIL) 10 MG tablet, Take 10 mg by mouth 2 (two) times daily as needed for muscle spasms. , Disp: , Rfl:  .  diltiazem (CARDIZEM CD) 120 MG 24 hr capsule, Take 120 mg by mouth at bedtime., Disp: , Rfl:  .  fluticasone (FLONASE) 50 MCG/ACT nasal spray, Place 2 sprays into the nose daily as needed for rhinitis. , Disp: , Rfl:  .  omeprazole (PRILOSEC) 20 MG capsule, Take 40 mg by mouth 2 (two) times daily. Take 2 capsules in the morning and 2 capsules in the evening, Disp: , Rfl:  .  potassium chloride 20 MEQ/15ML (10%) SOLN, Take 20 mEq by mouth 2 (two) times daily. , Disp: , Rfl:  .  Tiotropium Bromide Monohydrate (SPIRIVA RESPIMAT) 2.5 MCG/ACT AERS, Take 2 puffs first thing in am and then another 2 puffs about 12 hours later. (Patient taking differently: Take 1 puff by mouth daily. ), Disp: 1 Inhaler, Rfl: 11 .  traMADol (ULTRAM) 50 MG tablet, Take 50 mg by mouth every 6 (six)  hours as needed for pain. Reported on 01/24/2016, Disp: , Rfl:  .  triamterene-hydrochlorothiazide (MAXZIDE-25) 37.5-25 MG per tablet, Take 1 tablet by mouth daily. Reported on 01/24/2016, Disp: , Rfl:   Past Medical History: Past Medical History  Diagnosis Date  . Ischemic heart disease   . MI (myocardial infarction) ([place])   . COPD, severe   . Papilloma of larynx   . Chest pain   . SOB (shortness of breath) on exertion   . Dyslipidemia   . COPD (chronic obstructive pulmonary disease) ([place])   . Hyperlipidemia   . Hypertension   . GERD (gastroesophageal reflux disease)   . Asthma   . PONV (postoperative nausea and vomiting)   . left Inguinal hernia 02/01/2012    Repaired 05/01/12   . 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 Coronary artery disease     Tobacco Use: History  Smoking status  . Former Smoker  . Quit date: 10/30/1990  Smokeless tobacco  . Not on file    Labs: Recent Review Flowsheet Data    Labs for ITP Cardiac and Pulmonary Rehab Latest Ref Rng 03/27/2013 09/02/2013 07/22/2014 01/21/2015 08/11/2015   Cholestrol 125 - 200 mg/dL 174 148 161 159 165   LDLCALC <130 mg/dL 101(H) 77 87 79 89   HDL >=  40 mg/dL 54.70 47.00 50.10 49.10 65   Trlycerides <150 mg/dL 93.0 121.0 119.0 153.0(H) 56      Capillary Blood Glucose: Lab Results  Component Value Date   GLUCAP 146* 11/12/2011     ADL UCSD:   Pulmonary Function Assessment:   Exercise Target Goals:    Exercise Program Goal: Individual exercise prescription set with THRR, safety & activity barriers. Participant demonstrates ability to understand and report RPE using BORG scale, to self-measure pulse accurately, and to acknowledge the importance of the exercise prescription.  Exercise Prescription Goal: Starting with aerobic activity 30 plus minutes a day, 3 days per week for initial exercise prescription. Provide home exercise prescription and guidelines that participant  acknowledges understanding prior to discharge.  Activity Barriers & Risk Stratification:   6 Minute Walk:   Initial Exercise Prescription:   Perform Capillary Blood Glucose checks as needed.  Exercise Prescription Changes:     Exercise Prescription Changes      02/15/16 1200 02/17/16 1230 02/17/16 1300 02/22/16 1200 02/24/16 1200   Exercise Review   Progression  Yes      Response to Exercise   Blood Pressure (Admit) 104/56 mmHg 136/79 mmHg  122/60 mmHg 114/60 mmHg   Blood Pressure (Exercise) 130/70 mmHg   144/80 mmHg 130/60 mmHg   Blood Pressure (Exit) 126/66 mmHg 118/64 mmHg  106/50 mmHg 116/64 mmHg   Heart Rate (Admit) 95 bpm 79 bpm  78 bpm 90 bpm   Heart Rate (Exercise) 90 bpm 111 bpm  116 bpm 122 bpm   Heart Rate (Exit) 97 bpm 93 bpm  95 bpm 98 bpm   Oxygen Saturation (Admit) 89 % 95 %  98 % 96 %   Oxygen Saturation (Exercise) 129 % 95 %  91 % 90 %   Oxygen Saturation (Exit) 109 % 96 %  97 % 97 %   Rating of Perceived Exertion (Exercise) 11 11  13     Perceived Dyspnea (Exercise) 1 0  2    Comments   --  reviewed home exercise prescription     Duration Progress to 45 minutes of aerobic exercise without signs/symptoms of physical distress Progress to 45 minutes of aerobic exercise without signs/symptoms of physical distress  Progress to 45 minutes of aerobic exercise without signs/symptoms of physical distress Progress to 45 minutes of aerobic exercise without signs/symptoms of physical distress   Intensity THRR unchanged THRR unchanged  THRR unchanged THRR unchanged   Progression   Progression  Continue to progress workloads to maintain intensity without signs/symptoms of physical distress.  Continue to progress workloads to maintain intensity without signs/symptoms of physical distress. Continue to progress workloads to maintain intensity without signs/symptoms of physical distress.   Resistance Training   Training Prescription Yes Yes  Yes Yes   Weight orange bands orange  bands  orange bands orange bands   Reps 10-12 10-12  10-12 10-12   Interval Training   Interval Training No No  No No   Treadmill   MPH 2.5   2 2.5   Grade 2   1 1    Minutes 15   15 15    Recumbant Bike   Level 3   3 3    Minutes 15   15 15    NuStep   Level 2 4  4 4    METs 2.1 2.4  2.4 2.4   Home Exercise Plan   Plans to continue exercise at   Home     Frequency   Add 3  additional days to program exercise sessions.       02/29/16 1200 03/07/16 1200 03/09/16 1300 03/14/16 1200 03/16/16 1200   Exercise Review   Progression Yes   Yes    Response to Exercise   Blood Pressure (Admit) 102/60 mmHg 112/54 mmHg 120/60 mmHg 120/56 mmHg 108/50 mmHg   Blood Pressure (Exercise) 146/72 mmHg 120/68 mmHg 160/72 mmHg 138/60 mmHg 132/70 mmHg   Blood Pressure (Exit) 112/66 mmHg 122/60 mmHg 106/50 mmHg 99/64 mmHg 120/62 mmHg   Heart Rate (Admit) 89 bpm 88 bpm 87 bpm 82 bpm 76 bpm   Heart Rate (Exercise) 124 bpm 117 bpm 113 bpm 108 bpm 114 bpm   Heart Rate (Exit) 95 bpm 94 bpm 100 bpm 89 bpm 95 bpm   Oxygen Saturation (Admit) 97 % 95 % 97 % 97 % 96 %   Oxygen Saturation (Exercise) 90 % 89 % 92 % 90 % 93 %   Oxygen Saturation (Exit) 96 % 94 % 97 % 97 % 96 %   Rating of Perceived Exertion (Exercise) 13 11 13 12 13    Perceived Dyspnea (Exercise) 2 1 2 2 3    Duration Progress to 45 minutes of aerobic exercise without signs/symptoms of physical distress Progress to 45 minutes of aerobic exercise without signs/symptoms of physical distress Progress to 45 minutes of aerobic exercise without signs/symptoms of physical distress Progress to 45 minutes of aerobic exercise without signs/symptoms of physical distress Progress to 45 minutes of aerobic exercise without signs/symptoms of physical distress   Intensity THRR unchanged THRR unchanged THRR unchanged THRR unchanged THRR unchanged   Progression   Progression Continue to progress workloads to maintain intensity without signs/symptoms of physical distress.  Continue to progress workloads to maintain intensity without signs/symptoms of physical distress. Continue to progress workloads to maintain intensity without signs/symptoms of physical distress. Continue to progress workloads to maintain intensity without signs/symptoms of physical distress. Continue to progress workloads to maintain intensity without signs/symptoms of physical distress.   Resistance Training   Training Prescription Yes Yes Yes Yes Yes   Weight BLUE BLUE BLUE BLUE BLUE   Reps 10-12 10-12 10-12 10-12 10-12   Interval Training   Interval Training No No No No No   Treadmill   MPH 2.5 2.5 2.5 2.5 2.5   Grade 1 1 1 1 1    Minutes 15 15 15 15 15    Recumbant Bike   Level 3 3 3 3     Minutes 15 15 15 15     NuStep   Level 5 5  6 6    Minutes 15 15  15 15    METs 2.4 1.9  2.5 2.7     03/21/16 1225 03/23/16 1200 03/28/16 1224 03/30/16 1340 04/06/16 1200   Response to Exercise   Blood Pressure (Admit) 122/60 mmHg 120/56 mmHg 112/60 mmHg 130/60 mmHg 124/52 mmHg   Blood Pressure (Exercise) 144/70 mmHg 162/74 mmHg 140/66 mmHg 140/68 mmHg 180/70 mmHg   Blood Pressure (Exit) 126/66 mmHg 110/54 mmHg 130/64 mmHg 108/66 mmHg 110/60 mmHg   Heart Rate (Admit) 88 bpm 85 bpm 90 bpm 88 bpm 87 bpm   Heart Rate (Exercise) 113 bpm 106 bpm 114 bpm 111 bpm 113 bpm   Heart Rate (Exit) 98 bpm 95 bpm 93 bpm 88 bpm 88 bpm   Oxygen Saturation (Admit) 96 % 95 % 94 % 93 % 94 %   Oxygen Saturation (Exercise) 90 % 94 % 88 % 84 % 87 %   Oxygen Saturation (Exit) 98 %  96 % 96 % 95 % 96 %   Rating of Perceived Exertion (Exercise) 13 13 13 15 12    Perceived Dyspnea (Exercise) 3 2 2 3  0   Duration Progress to 45 minutes of aerobic exercise without signs/symptoms of physical distress Progress to 45 minutes of aerobic exercise without signs/symptoms of physical distress Progress to 45 minutes of aerobic exercise without signs/symptoms of physical distress Progress to 45 minutes of aerobic exercise without  signs/symptoms of physical distress Progress to 45 minutes of aerobic exercise without signs/symptoms of physical distress   Intensity THRR unchanged THRR unchanged THRR unchanged THRR unchanged THRR unchanged   Progression   Progression Continue to progress workloads to maintain intensity without signs/symptoms of physical distress. Continue to progress workloads to maintain intensity without signs/symptoms of physical distress. Continue to progress workloads to maintain intensity without signs/symptoms of physical distress. Continue to progress workloads to maintain intensity without signs/symptoms of physical distress. Continue to progress workloads to maintain intensity without signs/symptoms of physical distress.   Resistance Training   Training Prescription Yes Yes Yes Yes Yes   Weight BLUE blue bands blue bands blue bands blue bands   Reps 10-12 10-12 10-12 10-12 10-12   Interval Training   Interval Training No No No No No   Treadmill   MPH 2.5  2  pt changed workload an c/o dizziness. removed from TM --    Grade 1  1 --    Minutes 15  3 --    Recumbant Bike   Level 3 3 2    pt decreased workload r/t SOB and cough. 2 3   Minutes 15 15 15 15 15    NuStep   Level 6 6 6 6 6    Minutes 15 15 15 15 15    METs 2.5 2.1 2.5 2.3 2.1   Track   Laps   9 --    Minutes   12 --      04/11/16 1200           Response to Exercise   Blood Pressure (Admit) 122/70 mmHg       Blood Pressure (Exercise) 154/72 mmHg       Blood Pressure (Exit) 120/60 mmHg       Heart Rate (Admit) 92 bpm       Heart Rate (Exercise) 119 bpm       Heart Rate (Exit) 101 bpm       Oxygen Saturation (Admit) 96 %       Oxygen Saturation (Exercise) 91 %       Oxygen Saturation (Exit) 97 %       Rating of Perceived Exertion (Exercise) 13       Perceived Dyspnea (Exercise) 3       Duration Progress to 45 minutes of aerobic exercise without signs/symptoms of physical distress       Intensity THRR unchanged        Progression   Progression Continue to progress workloads to maintain intensity without signs/symptoms of physical distress.       Resistance Training   Training Prescription Yes       Weight blue bands       Reps 10-12       Interval Training   Interval Training No       Treadmill   MPH 2.5       Grade 1       Minutes 15       Recumbant Bike   Level 3  Minutes 15       NuStep   Level 6       Minutes 15       METs 2.4          Exercise Comments:     Exercise Comments      02/17/16 0841 03/16/16 0856 04/13/16 0752       Exercise Comments Patient is cont. to progress exercise tolerance. Will discuss home exercise prescription.  Patient is increasing intensity on Nustep. He is now on level 6. Will continue to monitor and progress as appropriate. Patient has had minor set backs due to increase in cough and other symptoms. Will cont. to monitor and progress workload once feeling better.        Discharge Exercise Prescription (Final Exercise Prescription Changes):     Exercise Prescription Changes - 04/11/16 1200    Response to Exercise   Blood Pressure (Admit) 122/70 mmHg   Blood Pressure (Exercise) 154/72 mmHg   Blood Pressure (Exit) 120/60 mmHg   Heart Rate (Admit) 92 bpm   Heart Rate (Exercise) 119 bpm   Heart Rate (Exit) 101 bpm   Oxygen Saturation (Admit) 96 %   Oxygen Saturation (Exercise) 91 %   Oxygen Saturation (Exit) 97 %   Rating of Perceived Exertion (Exercise) 13   Perceived Dyspnea (Exercise) 3   Duration Progress to 45 minutes of aerobic exercise without signs/symptoms of physical distress   Intensity THRR unchanged   Progression   Progression Continue to progress workloads to maintain intensity without signs/symptoms of physical distress.   Resistance Training   Training Prescription Yes   Weight blue bands   Reps 10-12   Interval Training   Interval Training No   Treadmill   MPH 2.5   Grade 1   Minutes 15   Recumbant Bike   Level 3    Minutes 15   NuStep   Level 6   Minutes 15   METs 2.4       Nutrition:  Target Goals: Understanding of nutrition guidelines, daily intake of sodium 1500mg , cholesterol 200mg , calories 30% from fat and 7% or less from saturated fats, daily to have 5 or more servings of fruits and vegetables.  Biometrics:    Nutrition Therapy Plan and Nutrition Goals:     Nutrition Therapy & Goals - 02/22/16 1412    Nutrition Therapy   Diet High Calorie, High Protein   Personal Nutrition Goals   Personal Goal #1 0.5-2 lb wt gain to a goal wt of 124-130 lb at graduation from [facility], educate and counsel regarding individualized specific dietary modifications aiming towards targeted core components such as weight, hypertension, lipid management, diabetes, heart failure and other comorbidities.;Nutrition handout(s) given to patient.  High Calorie, High Protein diet, recipes, and suggestions for increasing calories and protein given   Expected Outcomes Short Term Goal: Understand basic principles of dietary content, such as calories, fat, sodium, cholesterol and nutrients.;Long Term Goal: Adherence to prescribed nutrition plan.      Nutrition Discharge: Rate Your Plate Scores:   Psychosocial: Target Goals: Acknowledge presence or absence of depression, maximize coping skills, provide positive support system. Participant is able to verbalize types and ability to use techniques and skills needed for reducing stress and depression.  Initial Review & Psychosocial Screening:   Quality of Life Scores:   PHQ-9:     Recent Review Flowsheet Data    Depression screen [facility] 2/9 01/24/2016  Decreased Interest 0   Down, Depressed, Hopeless 0   PHQ - 2 Score 0      Psychosocial Evaluation and Intervention:   Psychosocial Re-Evaluation:     Psychosocial Re-Evaluation      02/17/16 0808 03/16/16 0842 04/13/16 1207       Psychosocial  Re-Evaluation   Interventions Encouraged to attend Pulmonary Rehabilitation for the exercise Encouraged to attend Pulmonary Rehabilitation for the exercise Encouraged to attend Pulmonary Rehabilitation for the exercise       Education: Education Goals: Education classes will be provided on a weekly basis, covering required topics. Participant will state understanding/return demonstration of topics presented.  Learning Barriers/Preferences:   Education Topics: Risk Factor Reduction:  -Group instruction that is supported by a PowerPoint presentation. Instructor discusses the definition of a risk factor, different risk factors for pulmonary disease, and how the heart and lungs work together.     Nutrition for Pulmonary Patient:  -Group instruction provided by PowerPoint slides, verbal discussion, and written materials to support subject matter. The instructor gives an explanation and review of healthy diet recommendations, which includes a discussion on weight management, recommendations for fruit and vegetable consumption, as well as protein, fluid, caffeine, fiber, sodium, sugar, and alcohol. Tips for eating when patients are short of breath are discussed.          PULMONARY REHAB OTHER RESPIRATORY from 04/06/2016 in [place]   Date  03/30/16   Educator  RD   Instruction Review Code  2- meets goals/outcomes      Pursed Lip Breathing:  -Group instruction that is supported by demonstration and informational handouts. Instructor discusses the benefits of pursed lip and diaphragmatic breathing and detailed demonstration on how to preform both.        PULMONARY REHAB OTHER RESPIRATORY from 04/06/2016 in [place]   Date  02/10/16   Educator  RT   Instruction Review Code  2- meets goals/outcomes      Oxygen Safety:  -Group instruction provided by PowerPoint, verbal discussion, and written material to support subject matter.  There is an overview of "What is Oxygen" and "Why do we need it".  Instructor also reviews how to create a safe environment for oxygen use, the importance of using oxygen as prescribed, and the risks of noncompliance. There is a brief discussion on traveling with oxygen and resources the patient may utilize.      PULMONARY REHAB OTHER RESPIRATORY from 04/06/2016 in [facility]   Date  02/03/16   Educator  Trish Fountain   Instruction Review Code  2- meets goals/outcomes      Oxygen Equipment:  -Group instruction provided by [facility] Staff utilizing handouts, written materials, and equipment demonstrations.      PULMONARY REHAB OTHER RESPIRATORY from 04/06/2016 in [place]   Date  02/17/16   Educator  Ace G[employer] rep   Instruction Review Code  2- meets goals/outcomes      Signs and Symptoms:  -Group instruction provided by written material and verbal discussion to support subject matter. Warning signs and symptoms of infection, stroke, and heart attack are reviewed and when to call the physician/911 reinforced. Tips for preventing the spread of infection discussed.   Advanced Directives:  -Group instruction provided by verbal instruction and written material to support subject matter. Instructor reviews Advanced Directive laws and proper instruction for filling out document.      PULMONARY  REHAB OTHER RESPIRATORY from 04/06/2016 in [place]   Date  03/16/16   Educator  Mikki Santee [place]   Instruction Review Code  2- meets goals/outcomes      Pulmonary Video:  -Group video education that reviews the importance of medication and oxygen compliance, exercise, good nutrition, pulmonary hygiene, and pursed lip and diaphragmatic breathing for the pulmonary patient.      PULMONARY REHAB OTHER RESPIRATORY from 04/06/2016 in [place]   Date  04/06/16   Instruction Review Code   2- meets goals/outcomes      Exercise for the Pulmonary Patient:  -Group instruction that is supported by a PowerPoint presentation. Instructor discusses benefits of exercise, core components of exercise, frequency, duration, and intensity of an exercise routine, importance of utilizing pulse oximetry during exercise, safety while exercising, and options of places to exercise outside of rehab.        PULMONARY REHAB OTHER RESPIRATORY from 04/06/2016 in [place]   Date  03/02/16   Educator  ep   Instruction Review Code  2- meets goals/outcomes      Pulmonary Medications:  -Verbally interactive group education provided by instructor with focus on inhaled medications and proper administration.      PULMONARY REHAB OTHER RESPIRATORY from 04/06/2016 in [place]   Date  02/10/16   Educator  RT   Instruction Review Code  2- meets goals/outcomes      Anatomy and Physiology of the Respiratory System and Intimacy:  -Group instruction provided by PowerPoint, verbal discussion, and written material to support subject matter. Instructor reviews respiratory cycle and anatomical components of the respiratory system and their functions. Instructor also reviews differences in obstructive and restrictive respiratory diseases with examples of each. Intimacy, Sex, and Sexuality differences are reviewed with a discussion on how relationships can change when diagnosed with pulmonary disease. Common sexual concerns are reviewed.      PULMONARY REHAB OTHER RESPIRATORY from 04/06/2016 in [place]   Date  03/09/16   Educator  Trish Fountain   Instruction Review Code  2- meets goals/outcomes      Knowledge Questionnaire Score:   Core Components/Risk Factors/Patient Goals at Admission:   Core Components/Risk Factors/Patient Goals Review:      Goals and Risk Factor Review      02/17/16 0809 03/16/16 0842  04/13/16 1208       Core Components/Risk Factors/Patient Goals Review   Personal Goals Review Increase Strength and Stamina;Develop more efficient breathing techniques such as purse lipped breathing and diaphragmatic breathing and practicing self-pacing with activity.;Improve shortness of breath with ADL's Increase Strength and Stamina;Develop more efficient breathing techniques such as purse lipped breathing and diaphragmatic breathing and practicing self-pacing with activity.;Improve shortness of breath with ADL's Increase Strength and Stamina;Develop more efficient breathing techniques such as purse lipped breathing and diaphragmatic breathing and practicing self-pacing with activity.;Improve shortness of breath with ADL's     Review see "comments" section on ITP see "comments" section on ITP see "comments" section on ITP     Expected Outcomes see Admission expected outcomes see Admission expected outcomes see Admission expected outcomes        Core Components/Risk Factors/Patient Goals at Discharge (Final Review):      Goals and Risk Factor Review - 04/13/16 1208    Core Components/Risk Factors/Patient Goals Review   Personal Goals Review Increase Strength and Stamina;Develop more efficient breathing  techniques such as purse lipped breathing and diaphragmatic breathing and practicing self-pacing with activity.;Improve shortness of breath with ADL's   Review see "comments" section on ITP   Expected Outcomes see Admission expected outcomes      ITP Comments:   Comments: ITP REVIEW Pt is making expected progress toward personal goals after completing 20 sessions.   Recommend continued exercise, life style modification, education, and utilization of breathing techniques to increase stamina and strength and decrease shortness of breath with exertion.

## 2016-04-18 ENCOUNTER — Encounter (HOSPITAL_COMMUNITY)
Admission: RE | Admit: 2016-04-18 | Discharge: 2016-04-18 | Disposition: A | Discharge status: Home / Self Care | Payer: Medicare Other | Source: Ambulatory Visit | Attending: Pulmonary Disease | Admitting: Pulmonary Disease

## 2016-04-18 VITALS — Wt 125.4 lb

## 2016-04-18 DIAGNOSIS — R0600 Dyspnea, unspecified: Secondary | ICD-10-CM | POA: Diagnosis not present

## 2016-04-18 NOTE — Progress Notes (Signed)
Daily Session Note  Patient Details  Name: William Lee MRN: [id number] Date of Birth: Jun 17, 1946 Referring Provider:    Encounter Date: 04/18/2016  Check In:     Session Check In - 04/18/16 1239    Check-In   Location [facility]   Staff Present Su Hilt, MS, ACSM RCEP, Exercise Physiologist;Annedrea Rosezella [place], RN, MHA;Joan Leonia Reeves, RN, BSN   Supervising physician immediately available to respond to emergencies [facility] immediately available   Physician(s) Dr. Marily Memos   Medication changes reported     No   Fall or balance concerns reported    No   Warm-up and Cool-down Performed as group-led instruction   Resistance Training Performed Yes   VAD Patient? No   Pain Assessment   Currently in Pain? No/denies   Multiple Pain Sites No      Capillary Blood Glucose: No results found for this or any previous visit (from the past 24 hour(s)).      Exercise Prescription Changes - 04/18/16 1200    Response to Exercise   Blood Pressure (Admit) 138/60 mmHg   Blood Pressure (Exercise) 140/70 mmHg   Blood Pressure (Exit) 122/64 mmHg   Heart Rate (Admit) 90 bpm   Heart Rate (Exercise) 111 bpm   Heart Rate (Exit) 93 bpm   Oxygen Saturation (Admit) 97 %   Oxygen Saturation (Exercise) 93 %   Oxygen Saturation (Exit) 97 %   Rating of Perceived Exertion (Exercise) 13   Perceived Dyspnea (Exercise) 2   Duration Progress to 45 minutes of aerobic exercise without signs/symptoms of physical distress   Intensity THRR unchanged   Progression   Progression Continue to progress workloads to maintain intensity without signs/symptoms of physical distress.   Resistance Training   Training Prescription Yes   Weight blue bands   Reps 10-12   Interval Training   Interval Training No   Treadmill   MPH 2.5   Grade 1   Minutes 15   Recumbant Bike   Level 3   Minutes 15   NuStep   Level 6   Minutes 15   METs 2.6     Goals Met:  Exercise tolerated  well No report of cardiac concerns or symptoms Strength training completed today  Goals Unmet:  Not Applicable  Comments: Service time is from 10:30AM to 12:15PM    Dr. Rush Farmer is Medical Director for Pulmonary Rehab at [facility].

## 2016-04-19 ENCOUNTER — Ambulatory Visit (HOSPITAL_COMMUNITY)
Admission: RE | Admit: 2016-04-19 | Discharge: 2016-04-19 | Disposition: A | Discharge status: Home / Self Care | Payer: Medicare Other | Source: Ambulatory Visit | Attending: Vascular Surgery | Admitting: Vascular Surgery

## 2016-04-19 ENCOUNTER — Encounter (HOSPITAL_COMMUNITY)
Admission: RE | Disposition: A | Discharge status: Home / Self Care | Payer: Self-pay | Source: Ambulatory Visit | Attending: Vascular Surgery

## 2016-04-19 DIAGNOSIS — I7092 Chronic total occlusion of artery of the extremities: Secondary | ICD-10-CM | POA: Insufficient documentation

## 2016-04-19 DIAGNOSIS — I252 Old myocardial infarction: Secondary | ICD-10-CM | POA: Insufficient documentation

## 2016-04-19 DIAGNOSIS — E785 Hyperlipidemia, unspecified: Secondary | ICD-10-CM | POA: Insufficient documentation

## 2016-04-19 DIAGNOSIS — J45909 Unspecified asthma, uncomplicated: Secondary | ICD-10-CM | POA: Diagnosis not present

## 2016-04-19 DIAGNOSIS — I10 Essential (primary) hypertension: Secondary | ICD-10-CM | POA: Insufficient documentation

## 2016-04-19 DIAGNOSIS — K219 Gastro-esophageal reflux disease without esophagitis: Secondary | ICD-10-CM | POA: Diagnosis not present

## 2016-04-19 DIAGNOSIS — I70213 Atherosclerosis of native arteries of extremities with intermittent claudication, bilateral legs: Secondary | ICD-10-CM | POA: Diagnosis not present

## 2016-04-19 DIAGNOSIS — J449 Chronic obstructive pulmonary disease, unspecified: Secondary | ICD-10-CM | POA: Diagnosis not present

## 2016-04-19 DIAGNOSIS — Z87891 Personal history of nicotine dependence: Secondary | ICD-10-CM | POA: Diagnosis not present

## 2016-04-19 DIAGNOSIS — I70212 Atherosclerosis of native arteries of extremities with intermittent claudication, left leg: Secondary | ICD-10-CM | POA: Diagnosis not present

## 2016-04-19 DIAGNOSIS — I2510 Atherosclerotic heart disease of native coronary artery without angina pectoris: Secondary | ICD-10-CM | POA: Diagnosis not present

## 2016-04-19 DIAGNOSIS — Z7982 Long term (current) use of aspirin: Secondary | ICD-10-CM | POA: Insufficient documentation

## 2016-04-19 HISTORY — PX: PERIPHERAL VASCULAR CATHETERIZATION: SHX172C

## 2016-04-19 LAB — POCT I-STAT, CHEM 8
BUN: 14 mg/dL (ref 6–20)
Calcium, Ion: 1.16 mmol/L (ref 1.13–1.30)
Chloride: 101 mmol/L (ref 101–111)
Creatinine, Ser: 0.9 mg/dL (ref 0.61–1.24)
Glucose, Bld: 110 mg/dL — ABNORMAL HIGH (ref 65–99)
HCT: 44 % (ref 39.0–52.0)
Hemoglobin: 15 g/dL (ref 13.0–17.0)
Potassium: 3.5 mmol/L (ref 3.5–5.1)
Sodium: 138 mmol/L (ref 135–145)
TCO2: 27 mmol/L (ref 0–100)

## 2016-04-19 SURGERY — ABDOMINAL AORTOGRAM W/LOWER EXTREMITY

## 2016-04-19 MED ORDER — LIDOCAINE HCL (PF) 1 % IJ SOLN
INTRAMUSCULAR | Status: AC
  Filled 2016-04-19: qty 30

## 2016-04-19 MED ORDER — LIDOCAINE HCL (PF) 1 % IJ SOLN
INTRAMUSCULAR | Status: DC | PRN
  Administered 2016-04-19: 14:00:00

## 2016-04-19 MED ORDER — SODIUM CHLORIDE 0.9 % IV SOLN: 1.0000 mL/kg/h | INTRAVENOUS | Status: DC

## 2016-04-19 MED ORDER — ACETAMINOPHEN 325 MG PO TABS: 650.0000 mg | ORAL_TABLET | ORAL | Status: DC | PRN

## 2016-04-19 MED ORDER — HEPARIN (PORCINE) IN NACL 2-0.9 UNIT/ML-% IJ SOLN
INTRAMUSCULAR | Status: AC
  Filled 2016-04-19: qty 1000

## 2016-04-19 MED ORDER — SODIUM CHLORIDE 0.9 % IV SOLN
INTRAVENOUS | Status: DC
  Administered 2016-04-19: 12:00:00 via INTRAVENOUS

## 2016-04-19 MED ORDER — IODIXANOL 320 MG/ML IV SOLN
INTRAVENOUS | Status: DC | PRN
  Administered 2016-04-19: 97 mL via INTRA_ARTERIAL

## 2016-04-19 MED ORDER — LABETALOL HCL 5 MG/ML IV SOLN: 10.0000 mg | INTRAVENOUS | Status: DC | PRN

## 2016-04-19 SURGICAL SUPPLY — 9 items
CATH OMNI FLUSH 5F 65CM (CATHETERS) ×2 IMPLANT
COVER PRB 48X5XTLSCP FOLD TPE (BAG) ×1 IMPLANT
COVER PROBE 5X48 (BAG) ×1
KIT PV (KITS) ×2 IMPLANT
SHEATH PINNACLE 5F 10CM (SHEATH) ×2 IMPLANT
SYR MEDRAD MARK V 150ML (SYRINGE) ×2 IMPLANT
TRANSDUCER W/STOPCOCK (MISCELLANEOUS) ×2 IMPLANT
TRAY PV CATH (CUSTOM PROCEDURE TRAY) ×2 IMPLANT
WIRE HITORQ VERSACORE ST 145CM (WIRE) ×2 IMPLANT

## 2016-04-19 NOTE — Discharge Instructions (Signed)
Angiogram, Care After °Refer to this sheet in the next few weeks. These instructions provide you with information about caring for yourself after your procedure. Your health care provider may also give you more specific instructions. Your treatment has been planned according to current medical practices, but problems sometimes occur. Call your health care provider if you have any problems or questions after your procedure. °WHAT TO EXPECT AFTER THE PROCEDURE °After your procedure, it is typical to have the following: °· Bruising at the catheter insertion site that usually fades within 1-2 weeks. °· Blood collecting in the tissue (hematoma) that may be painful to the touch. It should usually decrease in size and tenderness within 1-2 weeks. °HOME CARE INSTRUCTIONS °· Take medicines only as directed by your health care provider. °· You may shower 24-48 hours after the procedure or as directed by your health care provider. Remove the bandage (dressing) and gently wash the site with plain soap and water. Pat the area dry with a clean towel. Do not rub the site, because this may cause bleeding. °· Do not take baths, swim, or use a hot tub until your health care provider approves. °· Check your insertion site every day for redness, swelling, or drainage. °· Do not apply powder or lotion to the site. °· Do not lift over 10 lb (4.5 kg) for 5 days after your procedure or as directed by your health care provider. °· Ask your health care provider when it is okay to: °¨ Return to work or school. °¨ Resume usual physical activities or sports. °¨ Resume sexual activity. °· Do not drive home if you are discharged the same day as the procedure. Have someone else drive you. °· You may drive 24 hours after the procedure unless otherwise instructed by your health care provider. °· Do not operate machinery or power tools for 24 hours after the procedure or as directed by your health care provider. °· If your procedure was done as an  outpatient procedure, which means that you went home the same day as your procedure, a responsible adult should be with you for the first 24 hours after you arrive home. °· Keep all follow-up visits as directed by your health care provider. This is important. °SEEK MEDICAL CARE IF: °· You have a fever. °· You have chills. °· You have increased bleeding from the catheter insertion site. Hold pressure on the site. °SEEK IMMEDIATE MEDICAL CARE IF: °· You have unusual pain at the catheter insertion site. °· You have redness, warmth, or swelling at the catheter insertion site. °· You have drainage (other than a small amount of blood on the dressing) from the catheter insertion site. °· The catheter insertion site is bleeding, and the bleeding does not stop after 30 minutes of holding steady pressure on the site. °· The area near or just beyond the catheter insertion site becomes pale, cool, tingly, or numb. °  °This information is not intended to replace advice given to you by your health care provider. Make sure you discuss any questions you have with your health care provider. °  °Document Released: 05/04/2005 Document Revised: 11/06/2014 Document Reviewed: 03/19/2013 °Elsevier Interactive Patient Education ©2016 [employer] ° °

## 2016-04-19 NOTE — Interval H&P Note (Signed)
History and Physical Interval Note:  04/19/2016 1:06 PM  William Lee  has presented today for surgery, with the diagnosis of pvd claudication left lower extremity  The various methods of treatment have been discussed with the patient and family. After consideration of risks, benefits and other options for treatment, the patient has consented to  Procedure(s): Abdominal Aortogram w/Lower Extremity (N/A) as a surgical intervention .  The patient's history has been reviewed, patient examined, no change in status, stable for surgery.  I have reviewed the patient's chart and labs.  Questions were answered to the patient's satisfaction.     Curt Jews

## 2016-04-19 NOTE — H&P (View-Only) (Signed)
[facility] [place]  Patient name: William Lee MRN: [id number] DOB: 04-Aug-1946 Sex: male  REASON FOR CONSULT: Left lower extremity intermittent claudication  HPI: William Lee is a 70 y.o. male, who is patient is very pleasant active 70 year old gentleman with her clear-cut history of lower extremity intermittent claudication. He reports this is been progressive over several years. It does limit him to some degree from the active lifestyle that he likes to pursue. He does walk on a treadmill and also walks for enjoyment and is limited by this. He has no arterial rest pain and no tissue loss. 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 No history of DVT.  Past Medical History  Diagnosis Date  . Ischemic heart disease   . MI (myocardial infarction) ([place])   . COPD, severe   . Papilloma of larynx   . Chest pain   . SOB (shortness of breath) on exertion   . Dyslipidemia   . COPD (chronic obstructive pulmonary disease) ([place])   . Hyperlipidemia   . Hypertension   . GERD (gastroesophageal reflux disease)   . Asthma   . PONV (postoperative nausea and vomiting)   . left Inguinal hernia 02/01/2012    Repaired 05/01/12   . 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 Coronary artery disease     Family History  Problem Relation Age of Onset  . Liver cancer Mother   . Bone cancer Father   . Lung cancer Sister   . Cancer Brother     unknown    SOCIAL HISTORY: Social History   Social History  . Marital Status: Married    Spouse Name: N/A  . Number of Children: N/A  . Years of Education: N/A   Occupational History  . Not on file.   Social History Main Topics  . Smoking status: Former Smoker    Quit date: 10/30/1990  . Smokeless tobacco: Not on file  . Alcohol Use: No  . Drug Use: Yes    Special: Marijuana  . Sexual Activity: Not on file   Other  Topics Concern  . Not on file   Social History Narrative    Allergies  Allergen Reactions  . Silver Rash    Pulls skin off when removed  . Tape Rash    Also cant use tegaderm.    Current Outpatient Prescriptions  Medication Sig Dispense Refill  . albuterol (PROVENTIL HFA;VENTOLIN HFA) 108 (90 BASE) MCG/ACT inhaler Inhale 2 puffs into the lungs every 6 (six) hours as needed for wheezing.    Marland Kitchen albuterol (PROVENTIL) (5 MG/ML) 0.5% nebulizer solution Take 2.5 mg by nebulization every 6 (six) hours as needed for wheezing or shortness of breath.     Marland Kitchen aspirin 81 MG tablet Take 81 mg by mouth daily.    Marland Kitchen atorvastatin (LIPITOR) 40 MG tablet Take 20 mg by mouth daily as needed.    . budesonide-formoterol (SYMBICORT) 160-4.5 MCG/ACT inhaler Inhale 2 puffs into the lungs 2 (two) times daily.    . chlorpheniramine-HYDROcodone (TUSSIONEX) 10-8 MG/5ML SUER Take 5 mLs by mouth daily as needed (cough).     . cyclobenzaprine (FLEXERIL) 10 MG tablet Take 10 mg by mouth 2 (two) times daily as needed for muscle spasms.     Marland Kitchen diltiazem (CARDIZEM CD) 120 MG 24 hr capsule Take 120 mg by mouth at  bedtime.    . fluticasone (FLONASE) 50 MCG/ACT nasal spray Place 2 sprays into the nose daily as needed for rhinitis.     Marland Kitchen omeprazole (PRILOSEC) 20 MG capsule Take 40 mg by mouth 2 (two) times daily. Take 2 capsules in the morning and 2 capsules in the evening    . potassium chloride 20 MEQ/15ML (10%) SOLN Take 20 mEq by mouth 2 (two) times daily.     Marland Kitchen tiotropium (SPIRIVA) 18 MCG inhalation capsule Place 18 mcg into inhaler and inhale daily.    . Tiotropium Bromide Monohydrate (SPIRIVA RESPIMAT) 2.5 MCG/ACT AERS Take 2 puffs first thing in am and then another 2 puffs about 12 hours later. 1 Inhaler 11  . traMADol (ULTRAM) 50 MG tablet Take 50 mg by mouth every 6 (six) hours as needed for pain. Reported on 01/24/2016    . triamterene-hydrochlorothiazide (MAXZIDE-25) 37.5-25 MG per tablet Take 1 tablet by mouth daily.  Reported on 01/24/2016     No current facility-administered medications for this visit.    REVIEW OF SYSTEMS:  [X]  denotes positive finding, [ ]  denotes negative finding Cardiac  Comments:  Chest pain or chest pressure:    Shortness of breath upon exertion:    Short of breath when lying flat:    Irregular heart rhythm:        Vascular    Pain in calf, thigh, or hip brought on by ambulation: x   Pain in feet at night that wakes you up from your sleep:     Blood clot in your veins:    Leg swelling:         Pulmonary    Oxygen at home:    Productive cough:  x   Wheezing:         Neurologic    Sudden weakness in arms or legs:     Sudden numbness in arms or legs:     Sudden onset of difficulty speaking or slurred speech:    Temporary loss of vision in one eye:     Problems with dizziness:         Gastrointestinal    Blood in stool:     Vomited blood:         Genitourinary    Burning when urinating:     Blood in urine:        Psychiatric    Major depression:         Hematologic    Bleeding problems:    Problems with blood clotting too easily:        Skin    Rashes or ulcers:        Constitutional    Fever or chills:      PHYSICAL EXAM: Filed Vitals:   03/29/16 1146  BP: 140/76  Pulse: 90  Temp: 97.7 F (36.5 C)  TempSrc: Oral  Resp: 16  Height: 5\' 2"  (1.575 m)  Weight: 124 lb (56.246 kg)  SpO2: 96%    GENERAL: The patient is a well-nourished male, in no acute distress. The vital signs are documented above. CARDIAC: There is a regular rate and rhythm.  VASCULAR: 2+ radial and 2+ femoral pulses. 2+ right dorsalis pedis pulse. Absent pedal pulses on the left. Does have a palpable left popliteal pulse. PULMONARY: There is good air exchange bilaterally without wheezing or rales. ABDOMEN: Soft and non-tender with normal pitched bowel sounds. No bruits noted MUSCULOSKELETAL: There are no major deformities or cyanosis. NEUROLOGIC: No focal weakness or  paresthesias are detected. SKIN: There are no ulcers or rashes noted. PSYCHIATRIC: The patient has a normal affect.  DATA:  Noninvasive studies today revealed a duplex of his lower extremity showing plaque throughout the common femoral artery superficial femoral artery and popliteal increasing distally. Waveforms are biphasic at the common femoral artery. Noninvasive studies at the [facility] on 02/28/2016 revealed ankle arm index in the left of 0.64 and normal on the right.  MEDICAL ISSUES: Intermittent claudication left leg. Reports this occurs through his buttocks thigh and down into his calf with walking and is relieved with rest. Discussed that this is not limb threatening. With his noninvasive studies and physical exam there is a reasonable chance that this is either left iliac or related to the left femoral bifurcation. Explained that he may be a candidate for stenting for correction of his symptoms. Certainly would not recommend major surgery for correction. Also explained that this potentially could be corrected with a local endarterectomy in the left groin depending on findings. He wished to proceed with arteriography for further evaluation and possible intervention. We will schedule this at his earliest convenience   Early, [place] [facility] [employer] [phone]

## 2016-04-19 NOTE — Op Note (Signed)
    OPERATIVE REPORT  DATE OF SURGERY: 04/19/2016  PATIENT: William Lee, 70 y.o. male MRN: [id number]  DOB: [birth year]  PRE-OPERATIVE DIAGNOSIS: Left leg claudication  POST-OPERATIVE DIAGNOSIS:  Same  PROCEDURE: Aortogram with bilateral lower extremity runoff  SURGEON:  Curt Jews, M.D.  ANESTHESIA:  1% lidocaine local  PLAN OF CARE: Holding area stable   PATIENT DISPOSITION:  PACU - hemodynamically stable  PROCEDURE DETAILS: Patient was taken to the progress are Slightly syringes were they're both groins prepped and draped in the sterile fashion.  Local using local anesthesia a single wall puncture the right common femoral artery was entered under sinusitis guidance.  The guidewire was passed up the suprarenal aorta.  A 5 French sheath was passed over the guidewire.  A Omni flush catheter was positioned the level of the suprarenal aorta and a aorta and pelvic arteriogram was obtained.  This revealed a single widely patent renal arteries bilaterally.  There was no evidence of atherosclerotic narrowing of the aorta or iliac vessels.  Next a long leg runoff was obtained.  This revealed widely patent common superficial femoral and profundus femoris arteries.  At the level of the popliteal arteries on the right there was subtotal occlusion at the mid popliteal artery with a widely patent below knee popliteal artery and two-vessel runoff through the anterior tibial and posterior tibial arteries.  On the left there was irregularity at the knee and then this total occlusion of the popliteal artery just above the anterior tibial artery takeoff.  There was reconstitution of the anterior tibial artery and tibioperoneal trunk with runoff to the foot mainly via the anterior tibial and posterior tibial arteries.  The patient tolerated to get me complication and was transferred to the holding area sheath was removed.  Findings #1 widely patent aortoiliac segments #2 patent common superficial femoral and  profundus femoris arteries #3 subtotal occlusion of the at [facility] artery on the right #4 irregularity of the at knee popliteal artery on the left and occlusion of the below-knee popliteal artery above the takeoff of the anterior tibial with two-vessel runoff via the anterior tibial and posterior tibial arteries   Curt Jews, M.D. 04/19/2016 2:18 PM

## 2016-04-20 ENCOUNTER — Encounter (HOSPITAL_COMMUNITY): Payer: Medicare Other

## 2016-04-20 ENCOUNTER — Encounter (HOSPITAL_COMMUNITY): Payer: Self-pay | Admitting: Vascular Surgery

## 2016-04-25 ENCOUNTER — Encounter (HOSPITAL_COMMUNITY): Admission: RE | Admit: 2016-04-25 | Payer: Medicare Other | Source: Ambulatory Visit

## 2016-04-27 ENCOUNTER — Encounter (HOSPITAL_COMMUNITY): Payer: Medicare Other

## 2016-05-02 ENCOUNTER — Encounter (HOSPITAL_COMMUNITY): Payer: Medicare Other

## 2016-05-04 ENCOUNTER — Encounter (HOSPITAL_COMMUNITY)
Admission: RE | Admit: 2016-05-04 | Discharge: 2016-05-04 | Disposition: A | Discharge status: Home / Self Care | Payer: Medicare Other | Source: Ambulatory Visit | Attending: Pulmonary Disease | Admitting: Pulmonary Disease

## 2016-05-04 VITALS — Wt 123.9 lb

## 2016-05-04 DIAGNOSIS — R0600 Dyspnea, unspecified: Secondary | ICD-10-CM | POA: Diagnosis not present

## 2016-05-04 NOTE — Progress Notes (Signed)
Daily Session Note  Patient Details  Name: William Lee MRN: [id number] Date of Birth: 10/17/1946 Referring Provider:    Encounter Date: 05/04/2016  Check In:     Session Check In - 05/04/16 1010    Check-In   [facility]   Staff Present Su Hilt, MS, ACSM RCEP, Exercise Physiologist;Lisa Ysidro Evert, RN;Lexine Jaspers Leonia Reeves, RN, BSN   Supervising physician immediately available to respond to emergencies [facility] immediately available   Physician(s) Dr. Cruzita Lederer   Medication changes reported     No   Fall or balance concerns reported    No   Warm-up and Cool-down Performed as group-led instruction   Resistance Training Performed Yes   VAD Patient? No   Pain Assessment   Currently in Pain? No/denies   Multiple Pain Sites No      Capillary Blood Glucose: No results found for this or any previous visit (from the past 24 hour(s)).      Exercise Prescription Changes - 05/04/16 1200    Response to Exercise   Blood Pressure (Admit) 126/60 mmHg   Blood Pressure (Exercise) 158/70 mmHg   Blood Pressure (Exit) 118/68 mmHg   Heart Rate (Admit) 87 bpm   Heart Rate (Exercise) 118 bpm   Heart Rate (Exit) 90 bpm   Oxygen Saturation (Admit) 96 %   Oxygen Saturation (Exercise) 91 %   Oxygen Saturation (Exit) 95 %   Rating of Perceived Exertion (Exercise) 12   Perceived Dyspnea (Exercise) 1   Duration Progress to 45 minutes of aerobic exercise without signs/symptoms of physical distress   Intensity THRR unchanged   Progression   Progression Continue to progress workloads to maintain intensity without signs/symptoms of physical distress.   Resistance Training   Training Prescription Yes   Weight blue bands   Reps 10-12   Interval Training   Interval Training No   Recumbant Bike   Level 3   Minutes 17   NuStep   Level 6   Minutes 17   METs 2.6     Goals Met:  Independence with exercise equipment Improved SOB with ADL's Using PLB without  cueing & demonstrates good technique Exercise tolerated well Strength training completed today  Goals Unmet:  Not Applicable  Comments: Service time is from 1030 to 1215    Dr. Rush Farmer is Medical Director for Pulmonary Rehab at [facility].

## 2016-05-09 ENCOUNTER — Encounter (HOSPITAL_COMMUNITY)
Admission: RE | Admit: 2016-05-09 | Discharge: 2016-05-09 | Disposition: A | Discharge status: Home / Self Care | Payer: Medicare Other | Source: Ambulatory Visit | Attending: Pulmonary Disease | Admitting: Pulmonary Disease

## 2016-05-09 VITALS — Wt 126.1 lb

## 2016-05-09 DIAGNOSIS — R0600 Dyspnea, unspecified: Secondary | ICD-10-CM

## 2016-05-09 NOTE — Progress Notes (Signed)
Daily Session Note  Patient Details  Name: William Lee MRN: [id number] Date of Birth: [birth year] Referring Provider:    Encounter Date: 05/09/2016  Check In:     Session Check In - 05/09/16 1013    Check-In   [facility]   Staff Present Rosebud Poles, RN, BSN;Ndia Sampath Ysidro Evert, RN;Portia Rollene Rotunda, RN, BSN;Ramon Dredge, RN, [facility]   Supervising physician immediately available to respond to emergencies [facility] immediately available   Physician(s) Dr. Marily Memos   Medication changes reported     No   Fall or balance concerns reported    No   Warm-up and Cool-down Performed as group-led instruction   Resistance Training Performed Yes   VAD Patient? No   Pain Assessment   Currently in Pain? No/denies   Multiple Pain Sites No      Capillary Blood Glucose: No results found for this or any previous visit (from the past 24 hour(s)).      Exercise Prescription Changes - 05/09/16 1200    Response to Exercise   Blood Pressure (Admit) 110/50 mmHg   Blood Pressure (Exercise) 140/80 mmHg   Blood Pressure (Exit) 104/68 mmHg   Heart Rate (Admit) 81 bpm   Heart Rate (Exercise) 111 bpm   Heart Rate (Exit) 88 bpm   Oxygen Saturation (Admit) 95 %   Oxygen Saturation (Exercise) 93 %   Oxygen Saturation (Exit) 97 %   Rating of Perceived Exertion (Exercise) 13   Perceived Dyspnea (Exercise) 2   Duration Progress to 45 minutes of aerobic exercise without signs/symptoms of physical distress   Intensity THRR unchanged   Progression   Progression Continue to progress workloads to maintain intensity without signs/symptoms of physical distress.   Resistance Training   Training Prescription Yes   Weight blue bands   Reps 10-12   Interval Training   Interval Training No   Treadmill   MPH 2.5   Grade 1   Minutes 17   Recumbant Bike   Level 3   Minutes 17   NuStep   Level 6   Minutes 17   METs 2.5     Goals Met:  Exercise tolerated well No report of  cardiac concerns or symptoms Strength training completed today  Goals Unmet:  Not Applicable  Comments: Service time is from 1030 to 1205    Dr. Rush Farmer is Medical Director for Pulmonary Rehab at [facility].

## 2016-05-11 ENCOUNTER — Encounter (HOSPITAL_COMMUNITY)
Admission: RE | Admit: 2016-05-11 | Discharge: 2016-05-11 | Disposition: A | Discharge status: Home / Self Care | Payer: Medicare Other | Source: Ambulatory Visit | Attending: Pulmonary Disease | Admitting: Pulmonary Disease

## 2016-05-11 VITALS — Wt 125.7 lb

## 2016-05-11 DIAGNOSIS — R0600 Dyspnea, unspecified: Secondary | ICD-10-CM | POA: Diagnosis not present

## 2016-05-11 NOTE — Progress Notes (Signed)
Daily Session Note  Patient Details  Name: William Lee MRN: [id number] Date of Birth: 05/19/1946 Referring Provider:    Encounter Date: 05/11/2016  Check In:     Session Check In - 05/11/16 1247    Check-In   [facility]   Staff Present Rosebud Poles, RN, Luisa Hart, RN, BSN;Marzetta Lanza Ysidro Evert, Felipe Drone, RN, [facility]   Supervising physician immediately available to respond to emergencies [facility] immediately available   Physician(s) Dr. Marthenia Rolling   Medication changes reported     No   Fall or balance concerns reported    No   Warm-up and Cool-down Performed as group-led instruction   Resistance Training Performed Yes   VAD Patient? No   Pain Assessment   Currently in Pain? No/denies   Multiple Pain Sites No      Capillary Blood Glucose: No results found for this or any previous visit (from the past 24 hour(s)).      Exercise Prescription Changes - 05/11/16 1200    Response to Exercise   Blood Pressure (Admit) 110/60 mmHg   Blood Pressure (Exercise) 150/74 mmHg   Blood Pressure (Exit) 102/64 mmHg   Heart Rate (Admit) 84 bpm   Heart Rate (Exercise) 114 bpm   Heart Rate (Exit) 77 bpm   Oxygen Saturation (Admit) 95 %   Oxygen Saturation (Exercise) 90 %   Oxygen Saturation (Exit) 97 %   Rating of Perceived Exertion (Exercise) 13   Perceived Dyspnea (Exercise) 3   Duration Progress to 45 minutes of aerobic exercise without signs/symptoms of physical distress   Intensity THRR unchanged   Progression   Progression Continue to progress workloads to maintain intensity without signs/symptoms of physical distress.   Resistance Training   Training Prescription Yes   Weight blue bands   Reps 10-12   Interval Training   Interval Training No   Treadmill   MPH 2.5   Grade 1   Minutes 17   NuStep   Level 6   Minutes 17   METs 2.5     Goals Met:  Exercise tolerated well No report of cardiac concerns or symptoms Strength  training completed today  Goals Unmet:  Not Applicable  Comments: Service time is from 1030 to 1220    Dr. Rush Farmer is Medical Director for Pulmonary Rehab at [facility].

## 2016-05-11 NOTE — Progress Notes (Signed)
Pulmonary Individual Treatment Plan  Patient Details  Name: William Lee MRN: [id number] Date of Birth: [birth year] Referring Provider: Dr. Nelda Marseille   Initial Encounter Date:       Pulmonary Rehab Walk Test from 01/25/2016 in [place]   Date  01/25/16      Visit Diagnosis: No diagnosis found.  Patient's Home Medications on Admission:   Current outpatient prescriptions:  .  albuterol (PROVENTIL HFA;VENTOLIN HFA) 108 (90 BASE) MCG/ACT inhaler, Inhale 2 puffs into the lungs every 6 (six) hours as needed for wheezing., Disp: , Rfl:  .  albuterol (PROVENTIL) (5 MG/ML) 0.5% nebulizer solution, Take 2.5 mg by nebulization every 6 (six) hours as needed for wheezing or shortness of breath. , Disp: , Rfl:  .  aspirin 81 MG tablet, Take 81 mg by mouth daily., Disp: , Rfl:  .  atorvastatin (LIPITOR) 40 MG tablet, Take 20 mg by mouth daily. , Disp: , Rfl:  .  budesonide-formoterol (SYMBICORT) 160-4.5 MCG/ACT inhaler, Inhale 2 puffs into the lungs 2 (two) times daily., Disp: , Rfl:  .  cyclobenzaprine (FLEXERIL) 10 MG tablet, Take 10 mg by mouth 2 (two) times daily as needed for muscle spasms. , Disp: , Rfl:  .  diltiazem (CARDIZEM CD) 120 MG 24 hr capsule, Take 120 mg by mouth at bedtime., Disp: , Rfl:  .  fluticasone (FLONASE) 50 MCG/ACT nasal spray, Place 2 sprays into the nose daily as needed for rhinitis. , Disp: , Rfl:  .  omeprazole (PRILOSEC) 20 MG capsule, Take 40 mg by mouth 2 (two) times daily. Take 2 capsules in the morning and 2 capsules in the evening, Disp: , Rfl:  .  potassium chloride 20 MEQ/15ML (10%) SOLN, Take 20 mEq by mouth 2 (two) times daily. , Disp: , Rfl:  .  Tiotropium Bromide Monohydrate (SPIRIVA RESPIMAT) 2.5 MCG/ACT AERS, Take 2 puffs first thing in am and then another 2 puffs about 12 hours later. (Patient taking differently: Take 1 puff by mouth daily. ), Disp: 1 Inhaler, Rfl: 11 .  traMADol (ULTRAM) 50 MG tablet, Take 50 mg by mouth every  6 (six) hours as needed for pain. Reported on 01/24/2016, Disp: , Rfl:  .  triamterene-hydrochlorothiazide (MAXZIDE-25) 37.5-25 MG per tablet, Take 1 tablet by mouth daily. Reported on 01/24/2016, Disp: , Rfl:   Past Medical History: Past Medical History  Diagnosis Date  . Ischemic heart disease   . MI (myocardial infarction) ([place])   . COPD, severe   . Papilloma of larynx   . Chest pain   . SOB (shortness of breath) on exertion   . Dyslipidemia   . COPD (chronic obstructive pulmonary disease) ([place])   . Hyperlipidemia   . Hypertension   . GERD (gastroesophageal reflux disease)   . Asthma   . PONV (postoperative nausea and vomiting)   . left Inguinal hernia 02/01/2012    Repaired 05/01/12   . 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 Coronary artery disease     Tobacco Use: History  Smoking status  . Former Smoker  . Quit date: 10/30/1990  Smokeless tobacco  . Not on file    Labs: Recent Review Flowsheet Data    Labs for ITP Cardiac and Pulmonary Rehab Latest Ref Rng 09/02/2013 07/22/2014 01/21/2015 08/11/2015 04/19/2016   Cholestrol 125 - 200 mg/dL 148 161 159 165 -   LDLCALC <130 mg/dL 77 87 79 89 -  HDL >=40 mg/dL 47.00 50.10 49.10 65 -   Trlycerides <150 mg/dL 121.0 119.0 153.0(H) 56 -   TCO2 0 - 100 mmol/L - - - - 27      Capillary Blood Glucose: Lab Results  Component Value Date   GLUCAP 146* 11/12/2011     ADL UCSD:     Pulmonary Assessment Scores      01/25/16 1145 05/10/16 1606     ADL UCSD   ADL Phase  Exit    SOB Score total 58 65       Pulmonary Function Assessment:     Pulmonary Function Assessment - 01/24/16 1301    Pulmonary Function Tests   FVC% 2.32 %   FEV1% 0.77 %   FEV1/FVC Ratio 32.95   DLCO% 12.24 %   Breath   Bilateral Breath Sounds Clear   Shortness of Breath Yes;Limiting activity      Exercise Target Goals:    Exercise Program Goal: Individual exercise prescription set with THRR, safety &  activity barriers. Participant demonstrates ability to understand and report RPE using BORG scale, to self-measure pulse accurately, and to acknowledge the importance of the exercise prescription.  Exercise Prescription Goal: Starting with aerobic activity 30 plus minutes a day, 3 days per week for initial exercise prescription. Provide home exercise prescription and guidelines that participant acknowledges understanding prior to discharge.  Activity Barriers & Risk Stratification:     Activity Barriers & Cardiac Risk Stratification - 01/24/16 1257    Activity Barriers & Cardiac Risk Stratification   Activity Barriers Arthritis;Deconditioning;Muscular Weakness;Shortness of Breath  of the neck      6 Minute Walk:     6 Minute Walk      01/25/16 1558       6 Minute Walk   Phase Initial     Distance 1429 feet     Walk Time 6 minutes     # of Rest Breaks 0     MPH 2.71     METS 3.92     RPE 12.5     Perceived Dyspnea  1     VO2 Peak 13.73     Symptoms Yes (comment)     Comments Lt leg pain "3/10" on pain scale and Lt leg felt like it was "going to sleep" at the end of the 6MWT per patient.     Resting HR 97 bpm     Resting BP 128/72 mmHg     Max Ex. HR 119 bpm     Max Ex. BP 180/80 mmHg     2 Minute Post BP 158/80 mmHg     Interval HR   Baseline HR 97     1 Minute HR 109     2 Minute HR 107     3 Minute HR 111     4 Minute HR 114     5 Minute HR 119     6 Minute HR 118     2 Minute Post HR 101     Interval Heart Rate? Yes     Interval Oxygen   Interval Oxygen? Yes  Room Air     Baseline Oxygen Saturation % 96 %     1 Minute Oxygen Saturation % 94 %     2 Minute Oxygen Saturation % 93 %     3 Minute Oxygen Saturation % 93 %     4 Minute Oxygen Saturation % 93 %     5 Minute Oxygen Saturation %  93 %     6 Minute Oxygen Saturation % 92 %     2 Minute Post Oxygen Saturation % 96 %        Initial Exercise Prescription:     Initial Exercise Prescription -  01/25/16 1600    Date of Initial Exercise RX and Referring Provider   Date 01/25/16   Treadmill   MPH 2.5   Grade 1   Minutes 15   Bike   Level 0.9   Minutes 15   NuStep   Level 3   METs 2.5   Prescription Details   Frequency (times per week) 2   Duration Progress to 45 minutes of aerobic exercise without signs/symptoms of physical distress   Intensity   THRR 40-80% of Max Heartrate 60-120   Ratings of Perceived Exertion 11-13   Perceived Dyspnea 0-4   Resistance Training   Training Prescription Yes   Weight orange bands   Reps 8-10      Perform Capillary Blood Glucose checks as needed.  Exercise Prescription Changes:     Exercise Prescription Changes      02/01/16 1200 02/03/16 1300 02/08/16 1200 02/10/16 1230 02/15/16 1200   Exercise Review   Progression No No Yes No    Response to Exercise   Blood Pressure (Admit) 142/72 mmHg 126/60 mmHg 124/64 mmHg 126/60 mmHg 104/56 mmHg   Blood Pressure (Exercise) 128/62 mmHg 174/80 mmHg 154/82 mmHg 164/74 mmHg 130/70 mmHg   Blood Pressure (Exit) 122/72 mmHg 104/60 mmHg 110/60 mmHg 96/54 mmHg  recheck 114/60 126/66 mmHg   Heart Rate (Admit) 79 bpm 86 bpm 89 bpm 93 bpm 95 bpm   Heart Rate (Exercise) 117 bpm 111 bpm 128 bpm 118 bpm 90 bpm   Heart Rate (Exit) 89 bpm 80 bpm 94 bpm 99 bpm 97 bpm   Oxygen Saturation (Admit) 96 % 96 % 96 % 93 % 89 %   Oxygen Saturation (Exercise) 91 % 90 % 90 % 87 % 129 %   Oxygen Saturation (Exit) 96 % 95 % 96 % 97 % 109 %   Rating of Perceived Exertion (Exercise) 14 13 13 13 11    Perceived Dyspnea (Exercise) 2 1 2 2 1    Duration  Progress to 30 minutes of continuous aerobic without signs/symptoms of physical distress Progress to 45 minutes of aerobic exercise without signs/symptoms of physical distress Progress to 45 minutes of aerobic exercise without signs/symptoms of physical distress Progress to 45 minutes of aerobic exercise without signs/symptoms of physical distress   Intensity THRR unchanged  THRR unchanged THRR unchanged THRR unchanged THRR unchanged   Progression   Progression  Continue progressive overload as per policy without signs/symptoms or physical distress. Continue to progress workloads to maintain intensity without signs/symptoms of physical distress. Continue to progress workloads to maintain intensity without signs/symptoms of physical distress.    Resistance Training   Training Prescription Yes Yes Yes Yes Yes   Weight orange bands orange bands orange bands  orange bands orange bands orange bands   Reps 8-10 8-10 10-12 10-12 10-12   Interval Training   Interval Training No No No No No   Treadmill   MPH 2.5  2.5 2.5 2.5   Grade 1  2 2 2    Minutes 15  15 15 15    Recumbant Bike   Level 3 1 3 3 3    Watts 12       Minutes 15 15 15 15 15    NuStep   Level  1 1 2  -- 2   METs 1.4 1.6 1.9 -- 2.1     02/17/16 1230 02/17/16 1300 02/22/16 1200 02/24/16 1200 02/29/16 1200   Exercise Review   Progression Yes    Yes   Response to Exercise   Blood Pressure (Admit) 136/79 mmHg  122/60 mmHg 114/60 mmHg 102/60 mmHg   Blood Pressure (Exercise)   144/80 mmHg 130/60 mmHg 146/72 mmHg   Blood Pressure (Exit) 118/64 mmHg  106/50 mmHg 116/64 mmHg 112/66 mmHg   Heart Rate (Admit) 79 bpm  78 bpm 90 bpm 89 bpm   Heart Rate (Exercise) 111 bpm  116 bpm 122 bpm 124 bpm   Heart Rate (Exit) 93 bpm  95 bpm 98 bpm 95 bpm   Oxygen Saturation (Admit) 95 %  98 % 96 % 97 %   Oxygen Saturation (Exercise) 95 %  91 % 90 % 90 %   Oxygen Saturation (Exit) 96 %  97 % 97 % 96 %   Rating of Perceived Exertion (Exercise) 11  13  13    Perceived Dyspnea (Exercise) 0  2  2   Comments  --  reviewed home exercise prescription      Duration Progress to 45 minutes of aerobic exercise without signs/symptoms of physical distress  Progress to 45 minutes of aerobic exercise without signs/symptoms of physical distress Progress to 45 minutes of aerobic exercise without signs/symptoms of physical distress Progress  to 45 minutes of aerobic exercise without signs/symptoms of physical distress   Intensity THRR unchanged  THRR unchanged THRR unchanged THRR unchanged   Progression   Progression Continue to progress workloads to maintain intensity without signs/symptoms of physical distress.  Continue to progress workloads to maintain intensity without signs/symptoms of physical distress. Continue to progress workloads to maintain intensity without signs/symptoms of physical distress. Continue to progress workloads to maintain intensity without signs/symptoms of physical distress.   Resistance Training   Training Prescription Yes  Yes Yes Yes   Weight orange bands  orange bands orange bands BLUE   Reps 10-12  10-12 10-12 10-12   Interval Training   Interval Training No  No No No   Treadmill   MPH   2 2.5 2.5   Grade   1 1 1    Minutes   15 15 15    Recumbant Bike   Level   3 3 3    Minutes   15 15 15    NuStep   Level 4  4 4 5    Minutes     15   METs 2.4  2.4 2.4 2.4   Home Exercise Plan   Plans to continue exercise at  Home      Frequency  Add 3 additional days to program exercise sessions.        03/07/16 1200 03/09/16 1300 03/14/16 1200 03/16/16 1200 03/21/16 1225   Exercise Review   Progression   Yes     Response to Exercise   Blood Pressure (Admit) 112/54 mmHg 120/60 mmHg 120/56 mmHg 108/50 mmHg 122/60 mmHg   Blood Pressure (Exercise) 120/68 mmHg 160/72 mmHg 138/60 mmHg 132/70 mmHg 144/70 mmHg   Blood Pressure (Exit) 122/60 mmHg 106/50 mmHg 99/64 mmHg 120/62 mmHg 126/66 mmHg   Heart Rate (Admit) 88 bpm 87 bpm 82 bpm 76 bpm 88 bpm   Heart Rate (Exercise) 117 bpm 113 bpm 108 bpm 114 bpm 113 bpm   Heart Rate (Exit) 94 bpm 100 bpm 89 bpm 95 bpm 98 bpm   Oxygen Saturation (  Admit) 95 % 97 % 97 % 96 % 96 %   Oxygen Saturation (Exercise) 89 % 92 % 90 % 93 % 90 %   Oxygen Saturation (Exit) 94 % 97 % 97 % 96 % 98 %   Rating of Perceived Exertion (Exercise) 11 13 12 13 13    Perceived Dyspnea (Exercise) 1  2 2 3 3    Duration Progress to 45 minutes of aerobic exercise without signs/symptoms of physical distress Progress to 45 minutes of aerobic exercise without signs/symptoms of physical distress Progress to 45 minutes of aerobic exercise without signs/symptoms of physical distress Progress to 45 minutes of aerobic exercise without signs/symptoms of physical distress Progress to 45 minutes of aerobic exercise without signs/symptoms of physical distress   Intensity THRR unchanged THRR unchanged THRR unchanged THRR unchanged THRR unchanged   Progression   Progression Continue to progress workloads to maintain intensity without signs/symptoms of physical distress. Continue to progress workloads to maintain intensity without signs/symptoms of physical distress. Continue to progress workloads to maintain intensity without signs/symptoms of physical distress. Continue to progress workloads to maintain intensity without signs/symptoms of physical distress. Continue to progress workloads to maintain intensity without signs/symptoms of physical distress.   Resistance Training   Training Prescription Yes Yes Yes Yes Yes   Weight BLUE BLUE BLUE BLUE BLUE   Reps 10-12 10-12 10-12 10-12 10-12   Interval Training   Interval Training No No No No No   Treadmill   MPH 2.5 2.5 2.5 2.5 2.5   Grade 1 1 1 1 1    Minutes 15 15 15 15 15    Recumbant Bike   Level 3 3 3  3    Minutes 15 15 15  15    NuStep   Level 5  6 6 6    Minutes 15  15 15 15    METs 1.9  2.5 2.7 2.5     03/23/16 1200 03/28/16 1224 03/30/16 1340 04/06/16 1200 04/11/16 1200   Response to Exercise   Blood Pressure (Admit) 120/56 mmHg 112/60 mmHg 130/60 mmHg 124/52 mmHg 122/70 mmHg   Blood Pressure (Exercise) 162/74 mmHg 140/66 mmHg 140/68 mmHg 180/70 mmHg 154/72 mmHg   Blood Pressure (Exit) 110/54 mmHg 130/64 mmHg 108/66 mmHg 110/60 mmHg 120/60 mmHg   Heart Rate (Admit) 85 bpm 90 bpm 88 bpm 87 bpm 92 bpm   Heart Rate (Exercise) 106 bpm 114 bpm 111 bpm  113 bpm 119 bpm   Heart Rate (Exit) 95 bpm 93 bpm 88 bpm 88 bpm 101 bpm   Oxygen Saturation (Admit) 95 % 94 % 93 % 94 % 96 %   Oxygen Saturation (Exercise) 94 % 88 % 84 % 87 % 91 %   Oxygen Saturation (Exit) 96 % 96 % 95 % 96 % 97 %   Rating of Perceived Exertion (Exercise) 13 13 15 12 13    Perceived Dyspnea (Exercise) 2 2 3  0 3   Duration Progress to 45 minutes of aerobic exercise without signs/symptoms of physical distress Progress to 45 minutes of aerobic exercise without signs/symptoms of physical distress Progress to 45 minutes of aerobic exercise without signs/symptoms of physical distress Progress to 45 minutes of aerobic exercise without signs/symptoms of physical distress Progress to 45 minutes of aerobic exercise without signs/symptoms of physical distress   Intensity THRR unchanged THRR unchanged THRR unchanged THRR unchanged THRR unchanged   Progression   Progression Continue to progress workloads to maintain intensity without signs/symptoms of physical distress. Continue to progress workloads to maintain intensity  without signs/symptoms of physical distress. Continue to progress workloads to maintain intensity without signs/symptoms of physical distress. Continue to progress workloads to maintain intensity without signs/symptoms of physical distress. Continue to progress workloads to maintain intensity without signs/symptoms of physical distress.   Resistance Training   Training Prescription Yes Yes Yes Yes Yes   Weight blue bands blue bands blue bands blue bands blue bands   Reps 10-12 10-12 10-12 10-12 10-12   Interval Training   Interval Training No No No No No   Treadmill   MPH  2  pt changed workload an c/o dizziness. removed from TM --  2.5   Grade  1 --  1   Minutes  3 --  15   Recumbant Bike   Level 3 2   pt decreased workload r/t SOB and cough. 2 3 3    Minutes 15 15 15 15 15    NuStep   Level 6 6 6 6 6    Minutes 15 15 15 15 15    METs 2.1 2.5 2.3 2.1 2.4   Track    Laps  9 --     Minutes  12 --       04/18/16 1200 05/04/16 1200 05/09/16 1200       Response to Exercise   Blood Pressure (Admit) 138/60 mmHg 126/60 mmHg 110/50 mmHg     Blood Pressure (Exercise) 140/70 mmHg 158/70 mmHg 140/80 mmHg     Blood Pressure (Exit) 122/64 mmHg 118/68 mmHg 104/68 mmHg     Heart Rate (Admit) 90 bpm 87 bpm 81 bpm     Heart Rate (Exercise) 111 bpm 118 bpm 111 bpm     Heart Rate (Exit) 93 bpm 90 bpm 88 bpm     Oxygen Saturation (Admit) 97 % 96 % 95 %     Oxygen Saturation (Exercise) 93 % 91 % 93 %     Oxygen Saturation (Exit) 97 % 95 % 97 %     Rating of Perceived Exertion (Exercise) 13 12 13      Perceived Dyspnea (Exercise) 2 1 2      Duration Progress to 45 minutes of aerobic exercise without signs/symptoms of physical distress Progress to 45 minutes of aerobic exercise without signs/symptoms of physical distress Progress to 45 minutes of aerobic exercise without signs/symptoms of physical distress     Intensity THRR unchanged THRR unchanged THRR unchanged     Progression   Progression Continue to progress workloads to maintain intensity without signs/symptoms of physical distress. Continue to progress workloads to maintain intensity without signs/symptoms of physical distress. Continue to progress workloads to maintain intensity without signs/symptoms of physical distress.     Resistance Training   Training Prescription Yes Yes Yes     Weight blue bands blue bands blue bands     Reps 10-12 10-12 10-12     Interval Training   Interval Training No No No     Treadmill   MPH 2.5  2.5     Grade 1  1     Minutes 15  17     Recumbant Bike   Level 3 3 3      Minutes 15 17 17      NuStep   Level 6 6 6      Minutes 15 17 17      METs 2.6 2.6 2.5        Exercise Comments:     Exercise Comments      02/17/16 0841 03/16/16 0856 04/13/16 0752 05/11/16 0741  Exercise Comments Patient is cont. to progress exercise tolerance. Will discuss home exercise  prescription.  Patient is increasing intensity on Nustep. He is now on level 6. Will continue to monitor and progress as appropriate. Patient has had minor set backs due to increase in cough and other symptoms. Will cont. to monitor and progress workload once feeling better. Patient has not had a workload increase in the last 30 days due to missed exercise sessions related to a diagnostic procedure for PVD       Discharge Exercise Prescription (Final Exercise Prescription Changes):     Exercise Prescription Changes - 05/09/16 1200    Response to Exercise   Blood Pressure (Admit) 110/50 mmHg   Blood Pressure (Exercise) 140/80 mmHg   Blood Pressure (Exit) 104/68 mmHg   Heart Rate (Admit) 81 bpm   Heart Rate (Exercise) 111 bpm   Heart Rate (Exit) 88 bpm   Oxygen Saturation (Admit) 95 %   Oxygen Saturation (Exercise) 93 %   Oxygen Saturation (Exit) 97 %   Rating of Perceived Exertion (Exercise) 13   Perceived Dyspnea (Exercise) 2   Duration Progress to 45 minutes of aerobic exercise without signs/symptoms of physical distress   Intensity THRR unchanged   Progression   Progression Continue to progress workloads to maintain intensity without signs/symptoms of physical distress.   Resistance Training   Training Prescription Yes   Weight blue bands   Reps 10-12   Interval Training   Interval Training No   Treadmill   MPH 2.5   Grade 1   Minutes 17   Recumbant Bike   Level 3   Minutes 17   NuStep   Level 6   Minutes 17   METs 2.5       Nutrition:  Target Goals: Understanding of nutrition guidelines, daily intake of sodium 1500mg , cholesterol 200mg , calories 30% from fat and 7% or less from saturated fats, daily to have 5 or more servings of fruits and vegetables.  Biometrics:     Pre Biometrics - 01/24/16 1307    Pre Biometrics   Grip Strength 37 kg       Nutrition Therapy Plan and Nutrition Goals:     Nutrition Therapy & Goals - 02/22/16 1412    Nutrition  Therapy   Diet High Calorie, High Protein   Personal Nutrition Goals   Personal Goal #1 0.5-2 lb wt gain to a goal wt of 124-130 lb at graduation from [place], educate and counsel regarding individualized specific dietary modifications aiming towards targeted core components such as weight, hypertension, lipid management, diabetes, heart failure and other comorbidities.;Nutrition handout(s) given to patient.  High Calorie, High Protein diet, recipes, and suggestions for increasing calories and protein given   Expected Outcomes Short Term Goal: Understand basic principles of dietary content, such as calories, fat, sodium, cholesterol and nutrients.;Long Term Goal: Adherence to prescribed nutrition plan.      Nutrition Discharge: Rate Your Plate Scores:     Nutrition Assessments - 01/28/16 1119    Rate Your Plate Scores   Pre Score 45      Psychosocial: Target Goals: Acknowledge presence or absence of depression, maximize coping skills, provide positive support system. Participant is able to verbalize types and ability to use techniques and skills needed for reducing stress and depression.  Initial Review & Psychosocial Screening:     Initial Psych Review & Screening - 01/24/16 1316    Family Dynamics  Good Support System? Yes   Barriers   Psychosocial barriers to participate in program There are no identifiable barriers or psychosocial needs.   Screening Interventions   Interventions Encouraged to exercise      Quality of Life Scores:     Quality of Life - 05/10/16 1607    Quality of Life Scores   Health/Function Post 20.93 %   Socioeconomic Post 20 %   Psych/Spiritual Post 21.29 %   Family Post 21.67 %   GLOBAL Post 20.85 %      PHQ-9:     Recent Review Flowsheet Data    Depression screen [facility] 2/9 01/24/2016   Decreased Interest 0   Down, Depressed, Hopeless 0   PHQ - 2 Score 0      Psychosocial Evaluation  and Intervention:     Psychosocial Evaluation - 01/24/16 1316    Psychosocial Evaluation & Interventions   Interventions Encouraged to exercise with the program and follow exercise prescription   Continued Psychosocial Services Needed No      Psychosocial Re-Evaluation:     Psychosocial Re-Evaluation      02/17/16 0808 03/16/16 0842 04/13/16 1207 05/11/16 0719     Psychosocial Re-Evaluation   Interventions Encouraged to attend Pulmonary Rehabilitation for the exercise Encouraged to attend Pulmonary Rehabilitation for the exercise Encouraged to attend Pulmonary Rehabilitation for the exercise Encouraged to attend Pulmonary Rehabilitation for the exercise    Comments    no psychosocial barriers identified      Education: Education Goals: Education classes will be provided on a weekly basis, covering required topics. Participant will state understanding/return demonstration of topics presented.  Learning Barriers/Preferences:     Learning Barriers/Preferences - 01/24/16 1259    Learning Barriers/Preferences   Learning Barriers None   Learning Preferences Skilled Demonstration      Education Topics: Risk Factor Reduction:  -Group instruction that is supported by a PowerPoint presentation. Instructor discusses the definition of a risk factor, different risk factors for pulmonary disease, and how the heart and lungs work together.     Nutrition for Pulmonary Patient:  -Group instruction provided by PowerPoint slides, verbal discussion, and written materials to support subject matter. The instructor gives an explanation and review of healthy diet recommendations, which includes a discussion on weight management, recommendations for fruit and vegetable consumption, as well as protein, fluid, caffeine, fiber, sodium, sugar, and alcohol. Tips for eating when patients are short of breath are discussed.          PULMONARY REHAB OTHER RESPIRATORY from 05/04/2016 in [place]   Date  03/30/16   Educator  RD   Instruction Review Code  2- meets goals/outcomes      Pursed Lip Breathing:  -Group instruction that is supported by demonstration and informational handouts. Instructor discusses the benefits of pursed lip and diaphragmatic breathing and detailed demonstration on how to preform both.        PULMONARY REHAB OTHER RESPIRATORY from 05/04/2016 in [place]   Date  02/10/16   Educator  RT   Instruction Review Code  2- meets goals/outcomes      Oxygen Safety:  -Group instruction provided by PowerPoint, verbal discussion, and written material to support subject matter. There is an overview of "What is Oxygen" and "Why do we need it".  Instructor also reviews how to create a safe environment for oxygen use, the importance of using oxygen as prescribed, and the risks of noncompliance.  There is a brief discussion on traveling with oxygen and resources the patient may utilize.      PULMONARY REHAB OTHER RESPIRATORY from 05/04/2016 in [place]   Date  02/03/16   Educator  Trish Fountain   Instruction Review Code  2- meets goals/outcomes      Oxygen Equipment:  -Group instruction [facility] by [facility] Staff utilizing handouts, written materials, and equipment demonstrations.      PULMONARY REHAB OTHER RESPIRATORY from 05/04/2016 in [place]   Date  02/17/16   Educator  Ace Gins rep   Instruction Review Code  2- meets goals/outcomes      Signs and Symptoms:  -Group instruction provided by written material and verbal discussion to support subject matter. Warning signs and symptoms of infection, stroke, and heart attack are reviewed and when to call the physician/911 reinforced. Tips for preventing the spread of infection discussed.   Advanced Directives:  -Group instruction provided by verbal instruction and written material to support subject  matter. Instructor reviews Advanced Directive laws and proper instruction for filling out document.      PULMONARY REHAB OTHER RESPIRATORY from 05/04/2016 in [place]   Date  03/16/16   Educator  Mikki Santee Hamitlon   Instruction Review Code  2- meets goals/outcomes      Pulmonary Video:  -Group video education that reviews the importance of medication and oxygen compliance, exercise, good nutrition, pulmonary hygiene, and pursed lip and diaphragmatic breathing for the pulmonary patient.      PULMONARY REHAB OTHER RESPIRATORY from 05/04/2016 in [place]   Date  04/06/16   Instruction Review Code  2- meets goals/outcomes      Exercise for the Pulmonary Patient:  -Group instruction that is supported by a PowerPoint presentation. Instructor discusses benefits of exercise, core components of exercise, frequency, duration, and intensity of an exercise routine, importance of utilizing pulse oximetry during exercise, safety while exercising, and options of places to exercise outside of rehab.        PULMONARY REHAB OTHER RESPIRATORY from 05/04/2016 in [place]   Date  05/04/16   Educator  ep   Instruction Review Code  2- meets goals/outcomes      Pulmonary Medications:  -Verbally interactive group education provided by instructor with focus on inhaled medications and proper administration.      PULMONARY REHAB OTHER RESPIRATORY from 05/04/2016 in [place]   Date  02/10/16   Educator  RT   Instruction Review Code  2- meets goals/outcomes      Anatomy and Physiology of the Respiratory System and Intimacy:  -Group instruction provided by PowerPoint, verbal discussion, and written material to support subject matter. Instructor reviews respiratory cycle and anatomical components of the respiratory system and their functions. Instructor also reviews differences in obstructive  and restrictive respiratory diseases with examples of each. Intimacy, Sex, and Sexuality differences are reviewed with a discussion on how relationships can change when diagnosed with pulmonary disease. Common sexual concerns are reviewed.      PULMONARY REHAB OTHER RESPIRATORY from 05/04/2016 in [place]   Date  03/09/16   Educator  Trish Fountain   Instruction Review Code  2- meets goals/outcomes      Knowledge Questionnaire Score:     Knowledge Questionnaire Score - 05/10/16 1606    Knowledge Questionnaire Score  Post Score 11/13      Core Components/Risk Factors/Patient Goals at Admission:     Personal Goals and Risk Factors at Admission - 01/24/16 1308    Core Components/Risk Factors/Patient Goals on Admission   Increase Strength and Stamina Yes   Intervention Provide advice, education, support and counseling about physical activity/exercise needs.;Develop an individualized exercise prescription for aerobic and resistive training based on initial evaluation findings, risk stratification, comorbidities and participant's personal goals.   Expected Outcomes Achievement of increased cardiorespiratory fitness and enhanced flexibility, muscular endurance and strength shown through measurements of functional capacity and personal statement of participant.   Improve shortness of breath with ADL's Yes   Intervention Provide education, individualized exercise plan and daily activity instruction to help decrease symptoms of SOB with activities of daily living.   Expected Outcomes Short Term: Achieves a reduction of symptoms when performing activities of daily living.   Develop more efficient breathing techniques such as purse lipped breathing and diaphragmatic breathing; and practicing self-pacing with activity Yes   Intervention Provide education, demonstration and support about specific breathing techniuqes utilized for more efficient breathing. Include  techniques such as pursed lipped breathing, diaphragmatic breathing and self-pacing activity.   Expected Outcomes Short Term: Participant will be able to demonstrate and use breathing techniques as needed throughout daily activities.      Core Components/Risk Factors/Patient Goals Review:      Goals and Risk Factor Review      01/24/16 1313 02/17/16 0809 03/16/16 0842 04/13/16 1208 05/11/16 0720   Core Components/Risk Factors/Patient Goals Review   Personal Goals Review Increase Strength and Stamina;Develop more efficient breathing techniques such as purse lipped breathing and diaphragmatic breathing and practicing self-pacing with activity.;Improve shortness of breath with ADL's Increase Strength and Stamina;Develop more efficient breathing techniques such as purse lipped breathing and diaphragmatic breathing and practicing self-pacing with activity.;Improve shortness of breath with ADL's Increase Strength and Stamina;Develop more efficient breathing techniques such as purse lipped breathing and diaphragmatic breathing and practicing self-pacing with activity.;Improve shortness of breath with ADL's Increase Strength and Stamina;Develop more efficient breathing techniques such as purse lipped breathing and diaphragmatic breathing and practicing self-pacing with activity.;Improve shortness of breath with ADL's Increase Strength and Stamina;Develop more efficient breathing techniques such as purse lipped breathing and diaphragmatic breathing and practicing self-pacing with activity.;Improve shortness of breath with ADL's   Review purse lip breathing, increase workloads on exercise equipment,  see "comments" section on ITP see "comments" section on ITP see "comments" section on ITP see "comments" section on ITP   Expected Outcomes more strength and stamina with ADL's, purse lip breathing without cuing and develope more efficient breathing see Admission expected outcomes see Admission expected outcomes see  Admission expected outcomes see Admission expected outcomes      Core Components/Risk Factors/Patient Goals at Discharge (Final Review):      Goals and Risk Factor Review - 05/11/16 0720    Core Components/Risk Factors/Patient Goals Review   Personal Goals Review Increase Strength and Stamina;Develop more efficient breathing techniques such as purse lipped breathing and diaphragmatic breathing and practicing self-pacing with activity.;Improve shortness of breath with ADL's   Review see "comments" section on ITP   Expected Outcomes see Admission expected outcomes      ITP Comments:     Comments: ITP REVIEW Pt is making expected progress toward personal goals after completing 23 sessions. Recommend continued exercise, life style modification, education, and utilization of breathing techniques to increase stamina and strength and decrease shortness of  breath with exertion.

## 2016-05-16 ENCOUNTER — Encounter (HOSPITAL_COMMUNITY): Payer: Medicare Other

## 2016-05-18 ENCOUNTER — Encounter (HOSPITAL_COMMUNITY)
Admission: RE | Admit: 2016-05-18 | Discharge: 2016-05-18 | Disposition: A | Discharge status: Home / Self Care | Payer: Medicare Other | Source: Ambulatory Visit | Attending: Pulmonary Disease | Admitting: Pulmonary Disease

## 2016-05-18 DIAGNOSIS — R0600 Dyspnea, unspecified: Secondary | ICD-10-CM | POA: Diagnosis not present

## 2016-05-23 ENCOUNTER — Encounter (HOSPITAL_COMMUNITY): Payer: Medicare Other

## 2016-06-20 NOTE — Progress Notes (Signed)
Discharge Summary  Patient Details  Name: William Lee MRN: [id number] Date of Birth: 1946-01-13 Referring Provider:     Number of Visits: 24  Reason for Discharge:  Patient reached a stable level of exercise. Patient independent in their exercise.  Smoking History:  History  Smoking Status  . Former Smoker  . Quit date: 10/30/1990  Smokeless Tobacco  . Not on file    Diagnosis:  Dyspnea  ADL UCSD:     Pulmonary Assessment Scores    Row Name 01/25/16 1145 05/10/16 1606       ADL UCSD   ADL Phase  - Exit    SOB Score total 58 65       Initial Exercise Prescription:     Initial Exercise Prescription - 01/25/16 1600      Date of Initial Exercise RX and Referring Provider   Date 01/25/16     Treadmill   MPH 2.5   Grade 1   Minutes 15     Bike   Level 0.9   Minutes 15     NuStep   Level 3   METs 2.5     Prescription Details   Frequency (times per week) 2   Duration Progress to 45 minutes of aerobic exercise without signs/symptoms of physical distress     Intensity   THRR 40-80% of Max Heartrate 60-120   Ratings of Perceived Exertion 11-13   Perceived Dyspnea 0-4     Resistance Training   Training Prescription Yes   Weight orange bands   Reps 8-10      Discharge Exercise Prescription (Final Exercise Prescription Changes):     Exercise Prescription Changes - 05/11/16 1200      Response to Exercise   Blood Pressure (Admit) 110/60   Blood Pressure (Exercise) 150/74   Blood Pressure (Exit) 102/64   Heart Rate (Admit) 84 bpm   Heart Rate (Exercise) 114 bpm   Heart Rate (Exit) 77 bpm   Oxygen Saturation (Admit) 95 %   Oxygen Saturation (Exercise) 90 %   Oxygen Saturation (Exit) 97 %   Rating of Perceived Exertion (Exercise) 13   Perceived Dyspnea (Exercise) 3   Duration Progress to 45 minutes of aerobic exercise without signs/symptoms of physical distress   Intensity THRR unchanged     Progression   Progression Continue to progress  workloads to maintain intensity without signs/symptoms of physical distress.     Resistance Training   Training Prescription Yes   Weight blue bands   Reps 10-12     Interval Training   Interval Training No     Treadmill   MPH 2.5   Grade 1   Minutes 17     NuStep   Level 6   Minutes 17   METs 2.5      Functional Capacity:     6 Minute Walk    Row Name 01/25/16 1558 05/18/16 1021       6 Minute Walk   Phase Initial Discharge    Distance 1429 feet 1465 feet    Walk Time 6 minutes 6 minutes    # of Rest Breaks 0 0    MPH 2.71 2.77    METS 3.92 3.14    RPE 12.5 14    Perceived Dyspnea  1 4    VO2 Peak 13.73  -    Symptoms Yes (comment) No    Comments Lt leg pain "3/10" on pain scale and Lt leg felt like it was "  going to sleep" at the end of the 6MWT per patient.  -    Resting HR 97 bpm 104 bpm    Resting BP 128/72 124/70    Max Ex. HR 119 bpm 148 bpm    Max Ex. BP 180/80 174/88    2 Minute Post BP 158/80 160/64      Interval HR   Baseline HR 97 104    1 Minute HR 109 126    2 Minute HR 107 134    3 Minute HR 111 141    4 Minute HR 114 143    5 Minute HR 119 147    6 Minute HR 118 148    2 Minute Post HR 101 112    Interval Heart Rate? Yes  -      Interval Oxygen   Interval Oxygen? Yes  Room Air Yes    Baseline Oxygen Saturation % 96 % 93 %    Baseline Liters of Oxygen  - 0 L    1 Minute Oxygen Saturation % 94 % 95 %    1 Minute Liters of Oxygen  - 0 L    2 Minute Oxygen Saturation % 93 % 91 %    2 Minute Liters of Oxygen  - 0 L    3 Minute Oxygen Saturation % 93 % 92 %    3 Minute Liters of Oxygen  - 0 L    4 Minute Oxygen Saturation % 93 % 93 %    4 Minute Liters of Oxygen  - 0 L    5 Minute Oxygen Saturation % 93 % 92 %    5 Minute Liters of Oxygen  - 0 L    6 Minute Oxygen Saturation % 92 % 89 %    6 Minute Liters of Oxygen  - 0 L    2 Minute Post Oxygen Saturation % 96 % 96 %    2 Minute Post Liters of Oxygen  - 0 L       Psychological,  QOL, Others - Outcomes: PHQ 2/9: Depression screen [facility] 2/9 05/18/2016 01/24/2016  Decreased Interest 0 0  Down, Depressed, Hopeless 0 0  PHQ - 2 Score 0 0    Quality of Life:     Quality of Life - 05/10/16 1607      Quality of Life Scores   Health/Function Post 20.93 %   Socioeconomic Post 20 %   Psych/Spiritual Post 21.29 %   Family Post 21.67 %   GLOBAL Post 20.85 %      Personal Goals: Goals established at orientation with interventions provided to work toward goal.     Personal Goals and Risk Factors at Admission - 01/24/16 1308      Core Components/Risk Factors/Patient Goals on Admission   Increase Strength and Stamina Yes   Intervention Provide advice, education, support and counseling about physical activity/exercise needs.;Develop an individualized exercise prescription for aerobic and resistive training based on initial evaluation findings, risk stratification, comorbidities and participant's personal goals.   Expected Outcomes Achievement of increased cardiorespiratory fitness and enhanced flexibility, muscular endurance and strength shown through measurements of functional capacity and personal statement of participant.   Improve shortness of breath with ADL's Yes   Intervention Provide education, individualized exercise plan and daily activity instruction to help decrease symptoms of SOB with activities of daily living.   Expected Outcomes Short Term: Achieves a reduction of symptoms when performing activities of daily living.   Develop more  efficient breathing techniques such as purse lipped breathing and diaphragmatic breathing; and practicing self-pacing with activity Yes   Intervention Provide education, demonstration and support about specific breathing techniuqes utilized for more efficient breathing. Include techniques such as pursed lipped breathing, diaphragmatic breathing and self-pacing activity.   Expected Outcomes Short Term: Participant will be able to  demonstrate and use breathing techniques as needed throughout daily activities.       Personal Goals Discharge:     Goals and Risk Factor Review    Row Name 01/24/16 1313 02/17/16 0809 03/16/16 0842 04/13/16 1208 05/11/16 0720     Core Components/Risk Factors/Patient Goals Review   Personal Goals Review Increase Strength and Stamina;Develop more efficient breathing techniques such as purse lipped breathing and diaphragmatic breathing and practicing self-pacing with activity.;Improve shortness of breath with ADL's Increase Strength and Stamina;Develop more efficient breathing techniques such as purse lipped breathing and diaphragmatic breathing and practicing self-pacing with activity.;Improve shortness of breath with ADL's Increase Strength and Stamina;Develop more efficient breathing techniques such as purse lipped breathing and diaphragmatic breathing and practicing self-pacing with activity.;Improve shortness of breath with ADL's Increase Strength and Stamina;Develop more efficient breathing techniques such as purse lipped breathing and diaphragmatic breathing and practicing self-pacing with activity.;Improve shortness of breath with ADL's Increase Strength and Stamina;Develop more efficient breathing techniques such as purse lipped breathing and diaphragmatic breathing and practicing self-pacing with activity.;Improve shortness of breath with ADL's   Review purse lip breathing, increase workloads on exercise equipment,  see "comments" section on ITP see "comments" section on ITP see "comments" section on ITP see "comments" section on ITP   Expected Outcomes more strength and stamina with ADL's, purse lip breathing without cuing and develope more efficient breathing see Admission expected outcomes see Admission expected outcomes see Admission expected outcomes see Admission expected outcomes      Nutrition & Weight - Outcomes:     Pre Biometrics - 01/24/16 1307      Pre Biometrics   Grip  Strength 37 kg       Nutrition:     Nutrition Therapy & Goals - 02/22/16 1412      Nutrition Therapy   Diet High Calorie, High Protein     Personal Nutrition Goals   Personal Goal #1 0.5-2 lb wt gain to a goal wt of 124-130 lb at graduation from [place], educate and counsel regarding individualized specific dietary modifications aiming towards targeted core components such as weight, hypertension, lipid management, diabetes, heart failure and other comorbidities.;Nutrition handout(s) given to patient.  High Calorie, High Protein diet, recipes, and suggestions for increasing calories and protein given   Expected Outcomes Short Term Goal: Understand basic principles of dietary content, such as calories, fat, sodium, cholesterol and nutrients.;Long Term Goal: Adherence to prescribed nutrition plan.      Nutrition Discharge:     Nutrition Assessments - 06/02/16 1003      Rate Your Plate Scores   Pre Score 45  Goal is for score to be < 49 due to pt's need to gain wt   Post Score 47  Goal is for score to be < 49 due to pt's need to gain wt      Education Questionnaire Score:     Knowledge Questionnaire Score - 05/10/16 1606      Knowledge Questionnaire Score   Post Score 11/13     Goals reviewed. Mavrik made significant progress in his perceived shortness  of breath. He tolerated increased workloads which has increased his stamina and strength. All goals met.

## 2016-06-20 NOTE — Addendum Note (Signed)
Encounter addended by: Benedetto Goad, RN on: 06/20/2016  9:00 AM<BR>    Actions taken: Sign clinical note

## 2016-07-27 ENCOUNTER — Encounter (HOSPITAL_COMMUNITY): Payer: Self-pay | Admitting: *Deleted

## 2016-12-05 IMAGING — DX DG CHEST 2V
2 series · 2 of 2 positions shown · non-contrast
Comparison: PA and lateral chest x-ray November 11, 2011

CLINICAL DATA: Cough, chest congestion, and shortness of breath for
the past month; history of asthma -COPD, previous MI and CABG,
former smoker.

EXAM:
CHEST  2 VIEW

[chest pa]
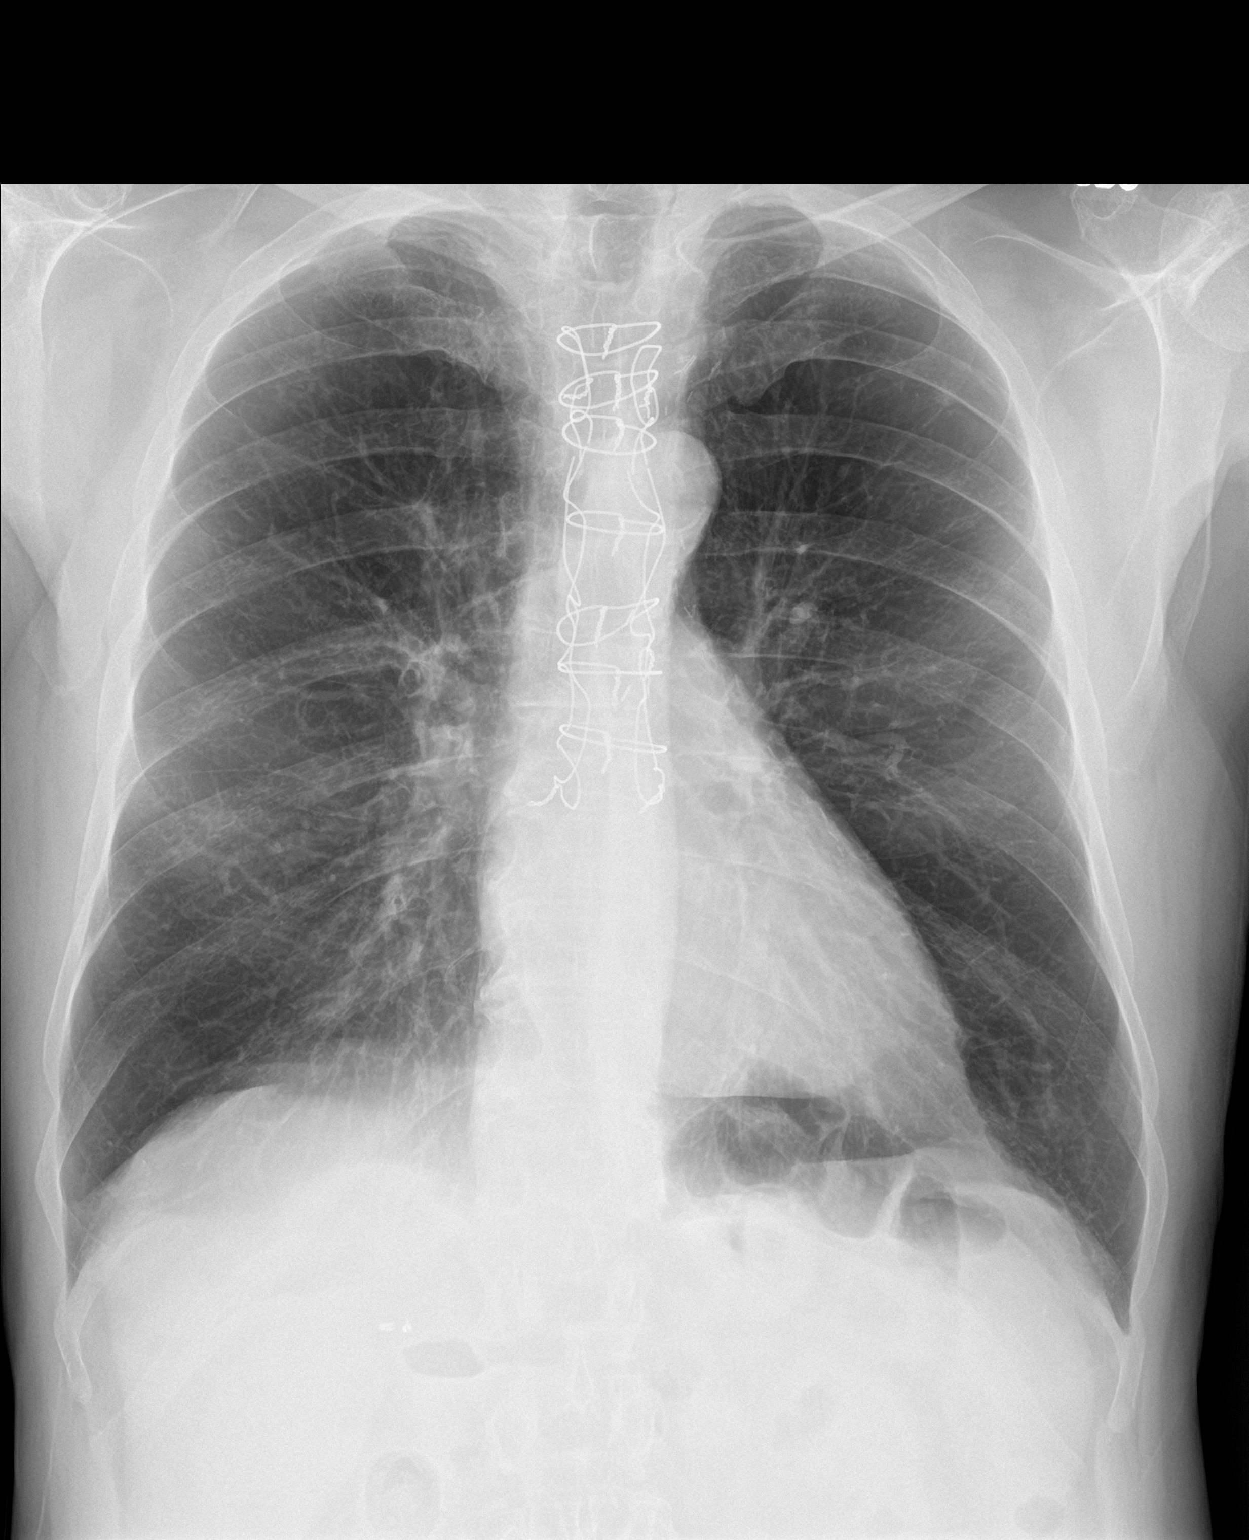

[chest lat]
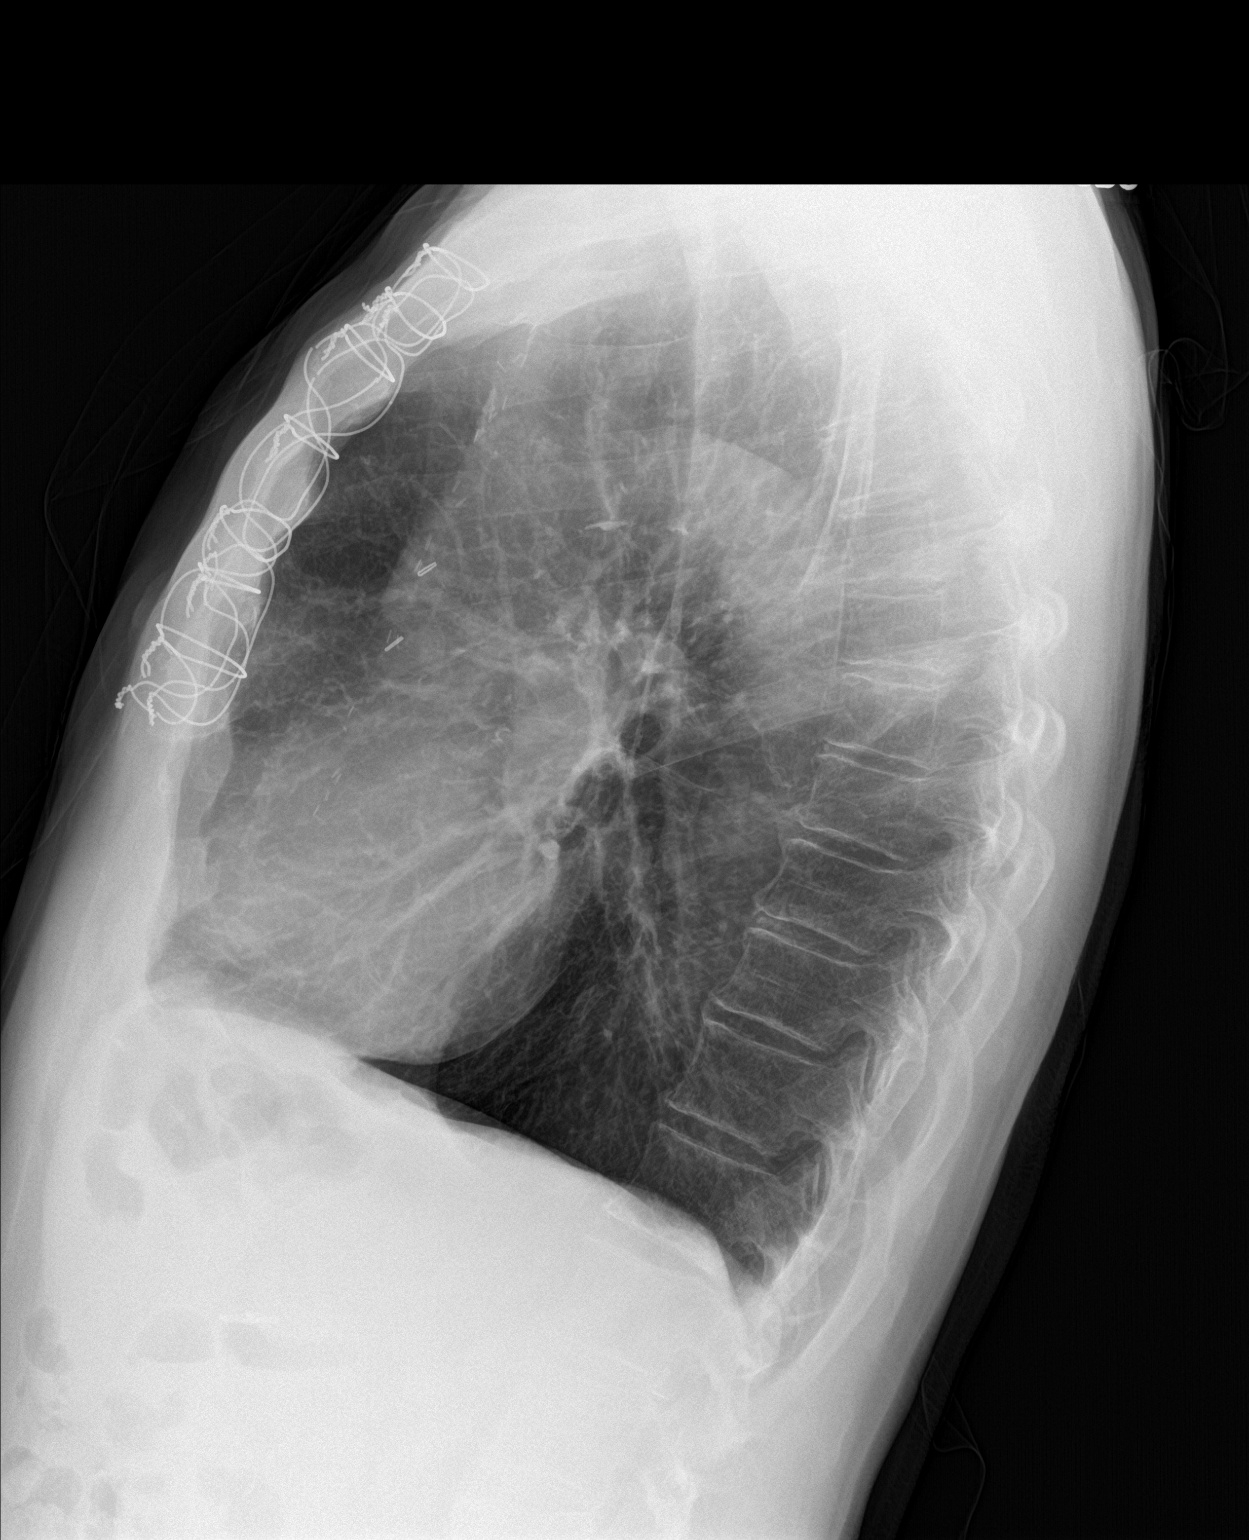

[2 of 2 positions shown; findings below may reference images not displayed]

FINDINGS: The lungs are mildly hyperinflated with hemidiaphragm flattening.
There is no focal infiltrate. There is a stable approximately 5 DIS
6 mm diameter left upper lobe nodule. There is no pleural effusion.
There is no pneumothorax or pneumomediastinum. The heart and
pulmonary vascularity are normal. The mediastinum is normal in
width. The patient has undergone previous median sternotomy. The
bony thorax exhibits no acute abnormalities.
IMPRESSION: COPD. There is no evidence of pneumonia, CHF, nor other acute
cardiopulmonary disease.

## 2016-12-27 ENCOUNTER — Ambulatory Visit
Admission: RE | Admit: 2016-12-27 | Discharge: 2016-12-27 | Disposition: A | Discharge status: Home / Self Care | Payer: Medicare Other | Source: Ambulatory Visit | Attending: Family Medicine | Admitting: Family Medicine

## 2016-12-27 ENCOUNTER — Other Ambulatory Visit: Payer: Self-pay | Admitting: Family Medicine

## 2016-12-27 DIAGNOSIS — R059 Cough, unspecified: Secondary | ICD-10-CM

## 2016-12-27 DIAGNOSIS — R0602 Shortness of breath: Secondary | ICD-10-CM

## 2016-12-27 DIAGNOSIS — R509 Fever, unspecified: Secondary | ICD-10-CM

## 2016-12-27 DIAGNOSIS — R05 Cough: Secondary | ICD-10-CM

## 2017-05-16 ENCOUNTER — Encounter (INDEPENDENT_AMBULATORY_CARE_PROVIDER_SITE_OTHER): Payer: Self-pay

## 2017-05-16 ENCOUNTER — Ambulatory Visit (INDEPENDENT_AMBULATORY_CARE_PROVIDER_SITE_OTHER): Payer: Medicare Other | Admitting: Cardiovascular Disease

## 2017-05-16 ENCOUNTER — Encounter: Payer: Self-pay | Admitting: Cardiovascular Disease

## 2017-05-16 VITALS — BP 130/70 | HR 88 | Ht 63.0 in | Wt 115.4 lb

## 2017-05-16 DIAGNOSIS — I739 Peripheral vascular disease, unspecified: Secondary | ICD-10-CM

## 2017-05-16 DIAGNOSIS — I2510 Atherosclerotic heart disease of native coronary artery without angina pectoris: Secondary | ICD-10-CM | POA: Insufficient documentation

## 2017-05-16 DIAGNOSIS — E782 Mixed hyperlipidemia: Secondary | ICD-10-CM

## 2017-05-16 HISTORY — DX: Mixed hyperlipidemia: E78.2

## 2017-05-16 HISTORY — DX: Atherosclerotic heart disease of native coronary artery without angina pectoris: I25.10

## 2017-05-16 NOTE — Progress Notes (Signed)
Cardiology Office Note   Date:  05/16/2017   ID:  William Lee, DOB 1946-05-08, MRN [id number]  PCP:  Leonard Downing, MD  Cardiologist: Darlin Coco MD  Problem list 1. Coronary artery disease-status post inferior wall myocardial infarction - CABG 1996.  2.  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  Hyperlipidemia 4.  PVD (Dr.  Donnetta Hutching)    Chief Complaint  Patient presents with  . Coronary Artery Disease      History of Present Illness: William Lee is a 71 y.o. male who presents for a six-month follow-up visit Seen for the first time today .  Transfer from [place] No angina Has chronic dyspnea   Past Medical History:  Diagnosis Date  . Asthma   . Chest pain   . COPD (chronic obstructive pulmonary disease) ([place])   . COPD, severe   . Coronary artery disease   . Dyslipidemia   . GERD (gastroesophageal reflux disease)   . Hearing loss of both ears 09-11-13   bilateral hearing aids used  . Hyperlipidemia   . Hypertension   . Ischemic heart disease   . left Inguinal hernia 02/01/2012   Repaired 05/01/12   . MI (myocardial infarction) ([place])   . Papilloma of larynx   . PONV (postoperative nausea and vomiting)   . SOB (shortness of breath) on exertion     Past Surgical History:  Procedure Laterality Date  . CARDIAC CATHETERIZATION    . CHOLECYSTECTOMY  12/1995  . COLONOSCOPY WITH PROPOFOL N/A 09/23/2013   Procedure: COLONOSCOPY WITH PROPOFOL;  Surgeon: Winfield Cunas., MD;  Location: [facility];  Service: Endoscopy;  Laterality: N/A;  . CORONARY ARTERY BYPASS GRAFT    . ESOPHAGOGASTRODUODENOSCOPY (EGD) WITH PROPOFOL N/A 09/23/2013   Procedure: ESOPHAGOGASTRODUODENOSCOPY (EGD) WITH PROPOFOL;  Surgeon: Winfield Cunas., MD;  Location: [facility];  Service: Endoscopy;  Laterality: N/A;  . HERNIA REPAIR    . INGUINAL HERNIA REPAIR  05/01/2012   Procedure: HERNIA REPAIR INGUINAL ADULT;  Surgeon: Haywood Lasso, MD;  Location: [facility];   Service: General;  Laterality: Left;  inguinal  . papalomas virus removal  3/10 7/10 5/11 10/12  . PERIPHERAL VASCULAR CATHETERIZATION N/A 04/19/2016   Procedure: Abdominal Aortogram w/Lower Extremity;  Surgeon: Rosetta Posner, MD;  Location: [facility];  Service: Cardiovascular;  Laterality: N/A;     Current Outpatient Prescriptions  Medication Sig Dispense Refill  . albuterol (PROVENTIL HFA;VENTOLIN HFA) 108 (90 BASE) MCG/ACT inhaler Inhale 2 puffs into the lungs every 6 (six) hours as needed for wheezing.    Marland Kitchen albuterol (PROVENTIL) (5 MG/ML) 0.5% nebulizer solution Take 2.5 mg by nebulization every 6 (six) hours as needed for wheezing or shortness of breath.     Marland Kitchen aspirin 81 MG tablet Take 81 mg by mouth daily.    Marland Kitchen atorvastatin (LIPITOR) 40 MG tablet Take 20 mg by mouth daily.     . budesonide-formoterol (SYMBICORT) 160-4.5 MCG/ACT inhaler Inhale 2 puffs into the lungs 2 (two) times daily.    . cyclobenzaprine (FLEXERIL) 10 MG tablet Take 10 mg by mouth 2 (two) times daily as needed for muscle spasms.     Marland Kitchen diltiazem (CARDIZEM CD) 120 MG 24 hr capsule Take 120 mg by mouth at bedtime.    . fluticasone (FLONASE) 50 MCG/ACT nasal spray Place 2 sprays into the nose daily as needed for rhinitis.     Marland Kitchen omeprazole (PRILOSEC) 20 MG capsule Take  40 mg by mouth 2 (two) times daily. Take 2 capsules in the morning and 2 capsules in the evening    . potassium chloride 20 MEQ/15ML (10%) SOLN Take 20 mEq by mouth 2 (two) times daily.     . Tiotropium Bromide Monohydrate (SPIRIVA RESPIMAT) 2.5 MCG/ACT AERS Inhale 2 puffs into the lungs 2 (two) times daily.    . traMADol (ULTRAM) 50 MG tablet Take 50 mg by mouth every 6 (six) hours as needed for pain. Reported on 01/24/2016    . triamterene-hydrochlorothiazide (MAXZIDE-25) 37.5-25 MG per tablet Take 1 tablet by mouth daily. Reported on 01/24/2016     No current facility-administered medications for this visit.     Allergies:   Silver and Tape     Social History:  The patient  reports that he quit smoking about 26 years ago. He has never used smokeless tobacco. He reports that he uses drugs, including Marijuana. He reports that he does not drink alcohol.   Family History:  The patient's family history includes Bone cancer in his father; Cancer in his brother; Liver cancer in his mother; Lung cancer in his sister.    ROS:  Please see the history of present illness.   Otherwise, review of systems are positive for none.   All other systems are reviewed and negative.    PHYSICAL EXAM: VS:  BP 130/70   Pulse 88   Ht 5\' 3"  (1.6 m)   Wt 115 lb 6.4 oz (52.3 kg)   BMI 20.44 kg/m  , BMI Body mass index is 20.44 kg/m. GEN: Well nourished, well developed, in no acute distress  HEENT: normal  Neck: no JVD, carotid bruits, or masses Cardiac: RRR; no murmurs, rubs, or gallops,no edema  Respiratory:  clear to auscultation bilaterally, normal work of breathing GI: soft, nontender, nondistended, + BS MS: no deformity or atrophy  Skin: warm and dry, no rash Neuro:  Strength and sensation are intact Psych: euthymic mood, full affect   EKG:  EKG is ordered today. May 16, 2017  - NSR at 3.   RAE .  Old Inf. MI   Recent Labs: No results found for requested labs within last 8760 hours.    Lipid Panel    Component Value Date/Time   CHOL 165 08/11/2015 1133   TRIG 56 08/11/2015 1133   HDL 65 08/11/2015 1133   CHOLHDL 2.5 08/11/2015 1133   VLDL 11 08/11/2015 1133   LDLCALC 89 08/11/2015 1133      Wt Readings from Last 3 Encounters:  05/16/17 115 lb 6.4 oz (52.3 kg)  05/11/16 125 lb 10.6 oz (57 kg)  05/09/16 126 lb 1.7 oz (57.2 kg)         ASSESSMENT AND PLAN:  1. CAD with Inf. Mi ,  CABG in 1996.   2.  Chronic systolic CHF:  EF 70-35%  3. mild to moderate mitral regurgitation by echo.  4. Severe COPD  6. Hypercholesterolemia [facility] by [facility].  Chol = 166 Trigs = 145 HDL = 64 LDL = 73  Current medicines  are reviewed at length with the patient today.  The patient does not have concerns regarding medicines.  The following changes have been made:  no change  Labs/ tests ordered today include:   No orders of the defined types were placed in this encounter.   Disposition: Continue on same medication.  We are checking blood work today.  Recheck in 6 months for follow-up office visit lipid panel hepatic  function panel and basal metabolic panel.  Initial follow-up will probably be with [facility].     Mertie Moores, MD  05/16/2017 6:31 PM    [address] [place],  [place],   [id number] Pager [phone] Phone: [phone]; Fax: [phone]

## 2017-05-16 NOTE — Patient Instructions (Signed)
Medication Instructions:  Your physician recommends that you continue on your current medications as directed. Please refer to the Current Medication list given to you today.   Labwork: None Ordered   Testing/Procedures: None Ordered   Follow-Up: Your physician wants you to follow-up in: 6 months with Dr. Nahser.  You will receive a reminder letter in the mail two months in advance. If you don't receive a letter, please call our office to schedule the follow-up appointment.   If you need a refill on your cardiac medications before your next appointment, please call your pharmacy.   Thank you for choosing [facility]! Sadarius Norman, RN [phone]    

## 2017-12-10 ENCOUNTER — Encounter: Payer: Self-pay | Admitting: Cardiovascular Disease

## 2017-12-10 ENCOUNTER — Ambulatory Visit (INDEPENDENT_AMBULATORY_CARE_PROVIDER_SITE_OTHER): Payer: Medicare Other | Admitting: Cardiovascular Disease

## 2017-12-10 VITALS — BP 116/58 | HR 83 | Wt 116.8 lb

## 2017-12-10 DIAGNOSIS — I2510 Atherosclerotic heart disease of native coronary artery without angina pectoris: Secondary | ICD-10-CM

## 2017-12-10 MED ORDER — LOSARTAN POTASSIUM 25 MG PO TABS: 25.0000 mg | ORAL_TABLET | Freq: Every day | ORAL | 3 refills | Status: DC

## 2017-12-10 NOTE — Progress Notes (Signed)
Cardiology Office Note   Date:  12/10/2017   ID:  William Lee, DOB [birth year], MRN [id number]  PCP:  Leonard Downing, MD  Cardiologist: Darlin Coco MD  Problem list 1. Coronary artery disease-status post inferior wall myocardial infarction - CABG 1996.  2.  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  Hyperlipidemia 4.  PVD (Dr.  Donnetta Hutching)    Chief Complaint  Patient presents with  . Coronary Artery Disease      05/16/17  William Lee is a 72 y.o. male who presents for a six-month follow-up visit Seen for the first time today .  Transfer from [place] No angina Has chronic dyspnea  Febrile 11, 2019: S/o CABG ,   Hx of COPD , is on home O2, 2 liters / min  No CP ,  Chronic shortness of breath    Past Medical History:  Diagnosis Date  . Asthma   . Chest pain   . COPD (chronic obstructive pulmonary disease) ([place])   . COPD, severe   . Coronary artery disease   . Dyslipidemia   . GERD (gastroesophageal reflux disease)   . Hearing loss of both ears 09-11-13   bilateral hearing aids used  . Hyperlipidemia   . Hypertension   . Ischemic heart disease   . left Inguinal hernia 02/01/2012   Repaired 05/01/12   . MI (myocardial infarction) ([place])   . Papilloma of larynx   . PONV (postoperative nausea and vomiting)   . SOB (shortness of breath) on exertion     Past Surgical History:  Procedure Laterality Date  . CARDIAC CATHETERIZATION    . CHOLECYSTECTOMY  12/1995  . COLONOSCOPY WITH PROPOFOL N/A 09/23/2013   Procedure: COLONOSCOPY WITH PROPOFOL;  Surgeon: Winfield Cunas., MD;  Location: [facility];  Service: Endoscopy;  Laterality: N/A;  . CORONARY ARTERY BYPASS GRAFT    . ESOPHAGOGASTRODUODENOSCOPY (EGD) WITH PROPOFOL N/A 09/23/2013   Procedure: ESOPHAGOGASTRODUODENOSCOPY (EGD) WITH PROPOFOL;  Surgeon: Winfield Cunas., MD;  [facility];  Service: Endoscopy;  Laterality: N/A;  . HERNIA REPAIR    . INGUINAL HERNIA REPAIR  05/01/2012   Procedure: HERNIA  REPAIR INGUINAL ADULT;  Surgeon: Haywood Lasso, MD;  Location: [facility];  Service: General;  Laterality: Left;  inguinal  . papalomas virus removal  3/10 7/10 5/11 10/12  . PERIPHERAL VASCULAR CATHETERIZATION N/A 04/19/2016   Procedure: Abdominal Aortogram w/Lower Extremity;  Surgeon: Rosetta Posner, MD;  Location: [facility];  Service: Cardiovascular;  Laterality: N/A;     Current Outpatient Medications  Medication Sig Dispense Refill  . albuterol (PROVENTIL HFA;VENTOLIN HFA) 108 (90 BASE) MCG/ACT inhaler Inhale 2 puffs into the lungs every 6 (six) hours as needed for wheezing.    Marland Kitchen albuterol (PROVENTIL) (5 MG/ML) 0.5% nebulizer solution Take 2.5 mg by nebulization every 6 (six) hours as needed for wheezing or shortness of breath.     Marland Kitchen aspirin 81 MG tablet Take 81 mg by mouth daily.    Marland Kitchen atorvastatin (LIPITOR) 40 MG tablet Take 20 mg by mouth daily.     . budesonide-formoterol (SYMBICORT) 160-4.5 MCG/ACT inhaler Inhale 2 puffs into the lungs 2 (two) times daily.    . cyclobenzaprine (FLEXERIL) 10 MG tablet Take 10 mg by mouth 2 (two) times daily as needed for muscle spasms.     Marland Kitchen diltiazem (CARDIZEM CD) 120 MG 24 hr capsule Take 120 mg by mouth at bedtime.    Marland Kitchen  fluticasone (FLONASE) 50 MCG/ACT nasal spray Place 2 sprays into the nose daily as needed for rhinitis.     Marland Kitchen omeprazole (PRILOSEC) 20 MG capsule Take 40 mg by mouth 2 (two) times daily. Take 2 capsules in the morning and 2 capsules in the evening    . potassium chloride 20 MEQ/15ML (10%) SOLN Take 20 mEq by mouth 2 (two) times daily.     . Tiotropium Bromide Monohydrate (SPIRIVA RESPIMAT) 2.5 MCG/ACT AERS Inhale 2 puffs into the lungs 2 (two) times daily.    . traMADol (ULTRAM) 50 MG tablet Take 50 mg by mouth every 6 (six) hours as needed for pain. Reported on 01/24/2016    . triamterene-hydrochlorothiazide (MAXZIDE-25) 37.5-25 MG per tablet Take 1 tablet by mouth daily. Reported on 01/24/2016     No current  facility-administered medications for this visit.     Allergies:   Silver and Tape    Social History:  The patient  reports that he quit smoking about 27 years ago. he has never used smokeless tobacco. He reports that he uses drugs. Drug: Marijuana. He reports that he does not drink alcohol.   Family History:  The patient's family history includes Bone cancer in his father; Cancer in his brother; Liver cancer in his mother; Lung cancer in his sister.    ROS:  Please see the history of present illness.   Otherwise, review of systems are positive for none.   All other systems are reviewed and negative.   Physical Exam: Blood pressure (!) 116/58, pulse 83, weight 116 lb 12.8 oz (53 kg), SpO2 97 %.  GEN:   Thin , elderly man,   Stature c/w someone with COPD  HEENT: Normal NECK: No JVD; No carotid bruits LYMPHATICS: No lymphadenopathy CARDIAC: RR RESPIRATORY:  Clear to auscultation without rales, wheezing or rhonchi  ABDOMEN: Soft, non-tender, non-distended MUSCULOSKELETAL:  No edema; No deformity  SKIN: Warm and dry NEUROLOGIC:  Alert and oriented x 3  EKG:     Recent Labs: No results found for requested labs within last 8760 hours.    Lipid Panel    Component Value Date/Time   CHOL 165 08/11/2015 1133   TRIG 56 08/11/2015 1133   HDL 65 08/11/2015 1133   CHOLHDL 2.5 08/11/2015 1133   VLDL 11 08/11/2015 1133   LDLCALC 89 08/11/2015 1133      Wt Readings from Last 3 Encounters:  12/10/17 116 lb 12.8 oz (53 kg)  05/16/17 115 lb 6.4 oz (52.3 kg)  05/11/16 125 lb 10.6 oz (57 kg)         ASSESSMENT AND PLAN:  1. CAD with Inf. Mi ,  CABG in 1996.  Doing well ,  No angina   2.  Chronic systolic CHF:  EF 58-85%, cannot tolerate  blockers due to his COPD  .  Has been on Diltiazem.   This is not typically what we would want to be treating him with given his moderate LV dysfunction but his HR tends to run high.   Will get an echo to reassess LV function . Add Losartan  25 mg a day   3. mild to moderate mitral regurgitation by echo.  4. Severe COPD - has stopped smoking .   Uses home O2.   6. Hypercholesterolemia -  Managed by the [facility]     Current medicines are reviewed at length with the patient today.  The patient does not have concerns regarding medicines.  The following changes have been  made:  no change  Labs/ tests ordered today include:   No orders of the defined types were placed in this encounter.   Disposition: Continue on same medication.  We are checking blood work today.  Recheck in 6 months for follow-up office visit lipid panel hepatic function panel and basal metabolic panel.  Initial follow-up will probably be with APP.     Mertie Moores, MD  12/10/2017 11:57 AM    [address],  [address] [place], [place]  [address] Pager [phone] Phone: [phone]; Fax: [phone]

## 2017-12-10 NOTE — Patient Instructions (Signed)
Medication Instructions:  Your physician has recommended you make the following change in your medication:  START Losartan (Cozaar) 25 mg once daily   Labwork: Your physician recommends that you return for lab work in: 3 weeks for basic metabolic panel   Testing/Procedures: Your physician has requested that you have an echocardiogram. Echocardiography is a painless test that uses sound waves to create images of your heart. It provides your doctor with information about the size and shape of your heart and how well your heart's chambers and valves are working. This procedure takes approximately one hour. There are no restrictions for this procedure.   Follow-Up: Your physician wants you to follow-up in: 6 months with Dr. Acie Fredrickson.  You will receive a reminder letter in the mail two months in advance. If you don't receive a letter, please call our office to schedule the follow-up appointment.   If you need a refill on your cardiac medications before your next appointment, please call your pharmacy.   Thank you for choosing [facility]! Christen Bame, RN [phone]

## 2017-12-11 ENCOUNTER — Telehealth: Payer: Self-pay | Admitting: Cardiovascular Disease

## 2017-12-11 NOTE — Telephone Encounter (Signed)
Per pt call he has a question about his medications.

## 2017-12-11 NOTE — Telephone Encounter (Signed)
Spoke with patient who called to ask if he is supposed to continue diltiazem in addition to the losartan Dr. Acie Fredrickson prescribed yesterday. I advised that each are low doses of the medications and per Dr. Acie Fredrickson, he should continue both. He verbalized understanding and thanked me for the call.

## 2017-12-31 ENCOUNTER — Ambulatory Visit (HOSPITAL_COMMUNITY): Payer: Medicare Other | Attending: Cardiovascular Disease

## 2017-12-31 ENCOUNTER — Other Ambulatory Visit: Payer: Self-pay

## 2017-12-31 ENCOUNTER — Other Ambulatory Visit: Payer: Medicare Other | Admitting: *Deleted

## 2017-12-31 DIAGNOSIS — J449 Chronic obstructive pulmonary disease, unspecified: Secondary | ICD-10-CM | POA: Diagnosis not present

## 2017-12-31 DIAGNOSIS — I2510 Atherosclerotic heart disease of native coronary artery without angina pectoris: Secondary | ICD-10-CM | POA: Diagnosis not present

## 2017-12-31 DIAGNOSIS — I259 Chronic ischemic heart disease, unspecified: Secondary | ICD-10-CM | POA: Diagnosis not present

## 2017-12-31 DIAGNOSIS — I10 Essential (primary) hypertension: Secondary | ICD-10-CM | POA: Diagnosis not present

## 2017-12-31 DIAGNOSIS — E785 Hyperlipidemia, unspecified: Secondary | ICD-10-CM | POA: Insufficient documentation

## 2017-12-31 DIAGNOSIS — J45909 Unspecified asthma, uncomplicated: Secondary | ICD-10-CM | POA: Insufficient documentation

## 2018-01-01 LAB — BASIC METABOLIC PANEL
BUN/Creatinine Ratio: 10 (ref 10–24)
BUN: 14 mg/dL (ref 8–27)
CO2: 25 mmol/L (ref 20–29)
Calcium: 9.7 mg/dL (ref 8.6–10.2)
Chloride: 101 mmol/L (ref 96–106)
Creatinine, Ser: 1.35 mg/dL — ABNORMAL HIGH (ref 0.76–1.27)
GFR calc Af Amer: 60 mL/min/{1.73_m2} (ref >59)
GFR calc non Af Amer: 52 mL/min/{1.73_m2} — ABNORMAL LOW (ref >59)
Glucose: 117 mg/dL — ABNORMAL HIGH (ref 65–99)
Potassium: 4 mmol/L (ref 3.5–5.2)
Sodium: 142 mmol/L (ref 134–144)

## 2018-03-01 ENCOUNTER — Other Ambulatory Visit: Payer: Self-pay

## 2018-03-01 DIAGNOSIS — Z79899 Other long term (current) drug therapy: Secondary | ICD-10-CM

## 2018-03-01 NOTE — Progress Notes (Signed)
BMET order placed per lab results.

## 2018-03-05 ENCOUNTER — Other Ambulatory Visit: Payer: Medicare Other

## 2018-03-05 DIAGNOSIS — Z79899 Other long term (current) drug therapy: Secondary | ICD-10-CM

## 2018-03-06 LAB — BASIC METABOLIC PANEL
BUN/Creatinine Ratio: 16 (ref 10–24)
BUN: 18 mg/dL (ref 8–27)
CO2: 25 mmol/L (ref 20–29)
Calcium: 9.1 mg/dL (ref 8.6–10.2)
Chloride: 99 mmol/L (ref 96–106)
Creatinine, Ser: 1.13 mg/dL (ref 0.76–1.27)
GFR calc Af Amer: 74 mL/min/{1.73_m2} (ref >59)
GFR calc non Af Amer: 64 mL/min/{1.73_m2} (ref >59)
Glucose: 106 mg/dL — ABNORMAL HIGH (ref 65–99)
Potassium: 4 mmol/L (ref 3.5–5.2)
Sodium: 138 mmol/L (ref 134–144)

## 2018-07-09 ENCOUNTER — Encounter: Payer: Self-pay | Admitting: Cardiovascular Disease

## 2018-07-09 ENCOUNTER — Ambulatory Visit (INDEPENDENT_AMBULATORY_CARE_PROVIDER_SITE_OTHER): Payer: Medicare Other | Admitting: Cardiovascular Disease

## 2018-07-09 VITALS — BP 112/64 | HR 93 | Ht 63.0 in | Wt 110.0 lb

## 2018-07-09 DIAGNOSIS — I2510 Atherosclerotic heart disease of native coronary artery without angina pectoris: Secondary | ICD-10-CM | POA: Diagnosis not present

## 2018-07-09 DIAGNOSIS — E782 Mixed hyperlipidemia: Secondary | ICD-10-CM

## 2018-07-09 NOTE — Patient Instructions (Signed)

## 2018-07-09 NOTE — Progress Notes (Signed)
Cardiology Office Note   Date:  07/09/2018   ID:  William Lee, DOB Jan 18, 1946, MRN [id number]  PCP:  Leonard Downing, MD  Cardiologist: Darlin Coco MD - now Brinly Maietta   Problem list 1. Coronary artery disease-status post inferior wall myocardial infarction - CABG 1996.  2.  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  Hyperlipidemia 4.  PVD (Dr.  Donnetta Hutching)    Chief Complaint  Patient presents with  . Coronary Artery Disease      05/16/17  CRUE OTERO is a 72 y.o. male who presents for a six-month follow-up visit Seen for the first time today .  Transfer from [place] No angina Has chronic dyspnea  Feb.  11, 2019: S/o CABG ,   Hx of COPD , is on home O2, 2 liters / min  No CP ,  Chronic shortness of breath   July 09, 2018: Dequarius is seen today for follow-up visit. He has a history of an inferior wall myocardial infarction in 1996.  He has a history of coronary artery bypass grafting.  He also has a history of severe COPD and is chronically short of breath. He is on Cardizem - EF is 40-45%   - EF increased slightly with the addition of the Losartan .  Ideally , we would not have him on Cardizem but he cannot take beta blockers. Due to his severe COPD .    Past Medical History:  Diagnosis Date  . Asthma   . Chest pain   . Community acquired pneumonia 11/12/2011  . COPD (chronic obstructive pulmonary disease) ([place])   . COPD, severe   . Coronary artery disease   . Coronary artery disease involving native coronary artery of native heart without angina pectoris 05/16/2017  . Dyslipidemia   . GERD (gastroesophageal reflux disease)   . Hearing loss of both ears 09-11-13   bilateral hearing aids used  . Hyperlipidemia   . Hypertension   . Ischemic heart disease   . left Inguinal hernia 02/01/2012   Repaired 05/01/12   . MI (myocardial infarction) ([place])   . Mixed hyperlipidemia 05/16/2017  . Papilloma of larynx   . PONV (postoperative nausea and vomiting)   . Shortness of  breath 11/11/2011  . SOB (shortness of breath) on exertion     Past Surgical History:  Procedure Laterality Date  . CARDIAC CATHETERIZATION    . CHOLECYSTECTOMY  12/1995  . COLONOSCOPY WITH PROPOFOL N/A 09/23/2013   Procedure: COLONOSCOPY WITH PROPOFOL;  Surgeon: Winfield Cunas., MD;  [facility];  Service: Endoscopy;  Laterality: N/A;  . CORONARY ARTERY BYPASS GRAFT    . ESOPHAGOGASTRODUODENOSCOPY (EGD) WITH PROPOFOL N/A 09/23/2013   Procedure: ESOPHAGOGASTRODUODENOSCOPY (EGD) WITH PROPOFOL;  Surgeon: Winfield Cunas., MD;  [facility];  Service: Endoscopy;  Laterality: N/A;  . HERNIA REPAIR    . INGUINAL HERNIA REPAIR  05/01/2012   Procedure: HERNIA REPAIR INGUINAL ADULT;  Surgeon: Haywood Lasso, MD;  Location: [facility];  Service: General;  Laterality: Left;  inguinal  . papalomas virus removal  3/10 7/10 5/11 10/12  . PERIPHERAL VASCULAR CATHETERIZATION N/A 04/19/2016   Procedure: Abdominal Aortogram w/Lower Extremity;  Surgeon: Rosetta Posner, MD;  Location: [facility];  Service: Cardiovascular;  Laterality: N/A;     Current Outpatient Medications  Medication Sig Dispense Refill  . albuterol (PROVENTIL HFA;VENTOLIN HFA) 108 (90 BASE) MCG/ACT inhaler Inhale 2 puffs into the lungs every 6 (  six) hours as needed for wheezing.    Marland Kitchen albuterol (PROVENTIL) (5 MG/ML) 0.5% nebulizer solution Take 2.5 mg by nebulization every 6 (six) hours as needed for wheezing or shortness of breath.     Marland Kitchen aspirin 81 MG tablet Take 81 mg by mouth daily.    Marland Kitchen atorvastatin (LIPITOR) 40 MG tablet Take 20 mg by mouth daily.     . budesonide-formoterol (SYMBICORT) 160-4.5 MCG/ACT inhaler Inhale 2 puffs into the lungs 2 (two) times daily.    . cyclobenzaprine (FLEXERIL) 10 MG tablet Take 10 mg by mouth 2 (two) times daily as needed for muscle spasms.     Marland Kitchen diltiazem (CARDIZEM CD) 120 MG 24 hr capsule Take 120 mg by mouth at bedtime.    . fluticasone (FLONASE) 50  MCG/ACT nasal spray Place 2 sprays into the nose daily as needed for rhinitis.     Marland Kitchen losartan (COZAAR) 25 MG tablet Take 1 tablet (25 mg total) by mouth daily. 90 tablet 3  . omeprazole (PRILOSEC) 20 MG capsule Take 40 mg by mouth 2 (two) times daily. Take 2 capsules in the morning and 2 capsules in the evening    . potassium chloride 20 MEQ/15ML (10%) SOLN Take 20 mEq by mouth 2 (two) times daily.     . Tiotropium Bromide Monohydrate (SPIRIVA RESPIMAT) 2.5 MCG/ACT AERS Inhale 2 puffs into the lungs 2 (two) times daily.    Marland Kitchen triamterene-hydrochlorothiazide (MAXZIDE-25) 37.5-25 MG per tablet Take 1 tablet by mouth daily. Reported on 01/24/2016     No current facility-administered medications for this visit.     Allergies:   Silver and Tape    Social History:  The patient  reports that he quit smoking about 27 years ago. He has never used smokeless tobacco. He reports that he has current or past drug history. Drug: Marijuana. He reports that he does not drink alcohol.   Family History:  The patient's family history includes Bone cancer in his father; Cancer in his brother; Liver cancer in his mother; Lung cancer in his sister.    ROS:  Please see the history of present illness.   Otherwise, review of systems are positive for none.   All other systems are reviewed and negative.   Physical Exam: Blood pressure 112/64, pulse 93, height 5\' 3"  (1.6 m), weight 110 lb (49.9 kg), SpO2 97 %.  GEN:   Thin, elderly man .  HEENT: Normal NECK: No JVD; No carotid bruits LYMPHATICS: No lymphadenopathy CARDIAC: RRR  RESPIRATORY:   Reduced breath sounds, c/w COPD  Abd:  Non -tender, non-distended MUSCULOSKELETAL:  No edema; No deformity  SKIN: Warm and dry NEUROLOGIC:  Alert and oriented x 3   EKG:     July 09, 2018: Old inferior wall myocardial infarction.  Normal sinus rhythm at 93.  No ST or T wave changes.  Recent Labs: 03/05/2018: BUN 18; Creatinine, Ser 1.13; Potassium 4.0; Sodium 138     Lipid Panel    Component Value Date/Time   CHOL 165 08/11/2015 1133   TRIG 56 08/11/2015 1133   HDL 65 08/11/2015 1133   CHOLHDL 2.5 08/11/2015 1133   VLDL 11 08/11/2015 1133   LDLCALC 89 08/11/2015 1133      Wt Readings from Last 3 Encounters:  07/09/18 110 lb (49.9 kg)  12/10/17 116 lb 12.8 oz (53 kg)  05/16/17 115 lb 6.4 oz (52.3 kg)     ASSESSMENT AND PLAN:  1. CAD with Inf. Mi ,  CABG  in 1996.  He is not had any episodes of angina.  His lipids been well controlled according to him.  [facility]  [facility]    2.  Chronic systolic CHF:    Start him on losartan at his last visit.  His ejection fraction has improved slightly.  He is also on diltiazem which is not ideal for heart failure.  Without the diltiazem his heart rate tends to run fast and I think overall that tachycardia would be worse for his ejection fraction then the low-dose diltiazem.  Continue diltiazem for now.  He does not tolerate beta-blockers because of his severe COPD.  3. mild to moderate mitral regurgitation by echo.  4. [facility] -   [facility]   6. Hypercholesterolemia -  Managed by the [place] .  Continue atorva     Current medicines are reviewed at length with the patient today.  The patient does not have concerns regarding medicines.  The following changes have been made:  no change  Labs/ tests ordered today include:   Orders Placed This Encounter  Procedures  . EKG 12-Lead   I'll see him in 1 year .     Mertie Moores, MD  07/09/2018 3:46 PM    [address],  [address] [place], [place]  [address] Pager [phone] Phone: [phone]; Fax: [phone]

## 2018-07-12 ENCOUNTER — Telehealth: Payer: Self-pay | Admitting: Cardiovascular Disease

## 2018-07-12 NOTE — Telephone Encounter (Signed)
Walk In pt form-Labs dropped off. Placed in [facility] doc box

## 2018-11-17 ENCOUNTER — Other Ambulatory Visit: Payer: Self-pay | Admitting: Cardiovascular Disease

## 2019-01-13 ENCOUNTER — Ambulatory Visit
Admission: RE | Admit: 2019-01-13 | Discharge: 2019-01-13 | Disposition: A | Discharge status: Home / Self Care | Payer: Medicare Other | Source: Ambulatory Visit | Attending: Family Medicine | Admitting: Family Medicine

## 2019-01-13 ENCOUNTER — Other Ambulatory Visit: Payer: Self-pay | Admitting: Family Medicine

## 2019-01-13 DIAGNOSIS — R05 Cough: Secondary | ICD-10-CM

## 2019-01-13 DIAGNOSIS — R059 Cough, unspecified: Secondary | ICD-10-CM

## 2019-01-22 ENCOUNTER — Other Ambulatory Visit: Payer: Self-pay | Admitting: Family Medicine

## 2019-01-22 DIAGNOSIS — R519 Headache, unspecified: Secondary | ICD-10-CM

## 2019-01-22 DIAGNOSIS — R51 Headache: Principal | ICD-10-CM

## 2019-01-23 ENCOUNTER — Other Ambulatory Visit: Payer: Self-pay

## 2019-01-23 ENCOUNTER — Ambulatory Visit
Admission: RE | Admit: 2019-01-23 | Discharge: 2019-01-23 | Disposition: A | Discharge status: Home / Self Care | Payer: Medicare Other | Source: Ambulatory Visit | Attending: Family Medicine | Admitting: Family Medicine

## 2019-01-23 DIAGNOSIS — R51 Headache: Principal | ICD-10-CM

## 2019-01-23 DIAGNOSIS — R519 Headache, unspecified: Secondary | ICD-10-CM

## 2019-01-24 ENCOUNTER — Other Ambulatory Visit: Payer: Self-pay | Admitting: Family Medicine

## 2019-01-24 DIAGNOSIS — G936 Cerebral edema: Secondary | ICD-10-CM

## 2019-01-27 ENCOUNTER — Other Ambulatory Visit: Payer: Self-pay

## 2019-01-27 ENCOUNTER — Ambulatory Visit
Admission: RE | Admit: 2019-01-27 | Discharge: 2019-01-27 | Disposition: A | Discharge status: Home / Self Care | Payer: Medicare Other | Source: Ambulatory Visit | Attending: Family Medicine | Admitting: Family Medicine

## 2019-01-27 DIAGNOSIS — G936 Cerebral edema: Secondary | ICD-10-CM

## 2019-01-27 MED ORDER — GADOBENATE DIMEGLUMINE 529 MG/ML IV SOLN
10.0000 mL | Freq: Once | INTRAVENOUS | Status: AC | PRN
  Administered 2019-01-27: 10 mL via INTRAVENOUS

## 2019-01-28 ENCOUNTER — Other Ambulatory Visit: Payer: Self-pay | Admitting: Family Medicine

## 2019-01-28 DIAGNOSIS — C799 Secondary malignant neoplasm of unspecified site: Secondary | ICD-10-CM

## 2019-01-29 ENCOUNTER — Ambulatory Visit
Admission: RE | Admit: 2019-01-29 | Discharge: 2019-01-29 | Disposition: A | Discharge status: Home / Self Care | Payer: Medicare Other | Source: Ambulatory Visit | Attending: Family Medicine | Admitting: Family Medicine

## 2019-01-29 ENCOUNTER — Other Ambulatory Visit: Payer: Self-pay

## 2019-01-29 DIAGNOSIS — C799 Secondary malignant neoplasm of unspecified site: Secondary | ICD-10-CM

## 2019-01-29 MED ORDER — IOPAMIDOL (ISOVUE-300) INJECTION 61%
100.0000 mL | Freq: Once | INTRAVENOUS | Status: AC | PRN
  Administered 2019-01-29: 14:00:00 100 mL via INTRAVENOUS

## 2019-02-03 ENCOUNTER — Other Ambulatory Visit: Payer: Self-pay | Admitting: *Deleted

## 2019-02-03 ENCOUNTER — Telehealth: Payer: Self-pay | Admitting: *Deleted

## 2019-02-03 ENCOUNTER — Other Ambulatory Visit: Payer: Self-pay | Admitting: Radiation Oncology

## 2019-02-03 DIAGNOSIS — C729 Malignant neoplasm of central nervous system, unspecified: Secondary | ICD-10-CM

## 2019-02-03 DIAGNOSIS — R918 Other nonspecific abnormal finding of lung field: Secondary | ICD-10-CM

## 2019-02-03 DIAGNOSIS — C7931 Secondary malignant neoplasm of brain: Secondary | ICD-10-CM

## 2019-02-03 NOTE — Telephone Encounter (Signed)
CALLED PATIENT TO INFORM OF PET SCAN FOR 02-07-19 - ARRIVAL TIME- 1:30 PM @ [facility], PT. TO HAVE WATER ONLY - 6 HRS. PRIOR TO TEST, SPOKE WITH PATIENT AND HE IS AWARE OF THIS TEST

## 2019-02-05 ENCOUNTER — Other Ambulatory Visit: Payer: Self-pay | Admitting: Radiation Therapy

## 2019-02-07 ENCOUNTER — Other Ambulatory Visit: Payer: Self-pay

## 2019-02-07 ENCOUNTER — Ambulatory Visit (HOSPITAL_COMMUNITY): Payer: Medicare Other

## 2019-02-07 ENCOUNTER — Ambulatory Visit (HOSPITAL_COMMUNITY)
Admission: RE | Admit: 2019-02-07 | Discharge: 2019-02-07 | Disposition: A | Discharge status: Home / Self Care | Payer: Medicare Other | Source: Ambulatory Visit | Attending: Radiation Oncology | Admitting: Radiation Oncology

## 2019-02-07 DIAGNOSIS — R918 Other nonspecific abnormal finding of lung field: Secondary | ICD-10-CM | POA: Insufficient documentation

## 2019-02-07 DIAGNOSIS — J439 Emphysema, unspecified: Secondary | ICD-10-CM | POA: Diagnosis not present

## 2019-02-07 DIAGNOSIS — R911 Solitary pulmonary nodule: Secondary | ICD-10-CM | POA: Insufficient documentation

## 2019-02-07 DIAGNOSIS — I700 Atherosclerosis of aorta: Secondary | ICD-10-CM | POA: Diagnosis not present

## 2019-02-07 LAB — GLUCOSE, CAPILLARY: Glucose-Capillary: 116 mg/dL — ABNORMAL HIGH (ref 70–99)

## 2019-02-07 MED ORDER — FLUDEOXYGLUCOSE F - 18 (FDG) INJECTION
5.2300 | Freq: Once | INTRAVENOUS | Status: AC | PRN
  Administered 2019-02-07: 11:00:00 5.23 via INTRAVENOUS

## 2019-02-10 ENCOUNTER — Telehealth: Payer: Self-pay | Admitting: Radiation Therapy

## 2019-02-10 NOTE — Telephone Encounter (Addendum)
Mr. William Lee was discussed this morning in the [facility]. The recent PET scan was reviewed and is not positive for lung cancer, however does have an area of enhancement in the cecum. Dr. Maree Erie reviewed this with one of the body scan radiologists, Dr. Jobe Igo. Dr. Jobe Igo feels that if he has had a colonoscopy on the last year, this area should be OK, BUT if he has not then this is an area of concern that will need a colonoscopy for further evaluation. Mr. William Lee record shows the last colonoscopy in 2014, and he did have polyps removed with negative path at that time.   I have shared this with Dr. Vertell Limber and he will communicate with the patient the plan to re-scan the brain in 1 month and get a colonoscopy scheduled for further evaluation of the cecum.    Mont Dutton [facility]   Radiation Oncology  Radiation Special Procedures Navigator Office: [phone]   Pager: [phone]  [place] Website: [email or web address]

## 2019-02-13 ENCOUNTER — Other Ambulatory Visit: Payer: Self-pay | Admitting: Cardiovascular Disease

## 2019-02-13 NOTE — Telephone Encounter (Signed)
[facility] pharmacy is requesting an alternative for Losartan. Pharmacy is stating that this medication is on backorder and would like to know if Dr. Acie Fredrickson would like to prescribe Valsartan. Please address

## 2019-02-14 ENCOUNTER — Telehealth: Payer: Self-pay | Admitting: Cardiovascular Disease

## 2019-02-14 ENCOUNTER — Other Ambulatory Visit: Payer: Self-pay | Admitting: Nurse Practitioner

## 2019-02-14 MED ORDER — VALSARTAN 40 MG PO TABS: 40.0000 mg | ORAL_TABLET | Freq: Every day | ORAL | 3 refills | Status: DC

## 2019-02-14 NOTE — Telephone Encounter (Signed)
[facility] pharmacy is requesting clarification on Valsartan 40 mg tablet. This medication was ordered but there is no directions in the sig on how the pt is supposed to be taking this medication. Please address and resend in. Thanks

## 2019-02-14 NOTE — Telephone Encounter (Signed)
Reorder with dose instructions sent

## 2019-03-03 ENCOUNTER — Other Ambulatory Visit: Payer: Self-pay | Admitting: Radiation Therapy

## 2019-03-06 ENCOUNTER — Ambulatory Visit
Admission: RE | Admit: 2019-03-06 | Discharge: 2019-03-06 | Disposition: A | Discharge status: Home / Self Care | Payer: Medicare Other | Source: Ambulatory Visit | Attending: Radiation Oncology | Admitting: Radiation Oncology

## 2019-03-06 ENCOUNTER — Other Ambulatory Visit: Payer: Self-pay

## 2019-03-06 DIAGNOSIS — C7931 Secondary malignant neoplasm of brain: Secondary | ICD-10-CM

## 2019-03-06 MED ORDER — GADOBENATE DIMEGLUMINE 529 MG/ML IV SOLN
10.0000 mL | Freq: Once | INTRAVENOUS | Status: AC | PRN
  Administered 2019-03-06: 10 mL via INTRAVENOUS

## 2019-03-10 ENCOUNTER — Inpatient Hospital Stay: Payer: Medicare Other | Attending: Neurosurgery

## 2019-08-28 NOTE — Progress Notes (Signed)
Cardiology Office Note   Date:  08/29/2019   ID:  William Lee, DOB 11-25-1945, MRN [id number]  PCP:  Leonard Downing, MD  Cardiologist: Darlin Coco MD - now Alfreida Steffenhagen   Problem list 1. Coronary artery disease-status post inferior wall myocardial infarction - CABG 1996.  2.  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  Hyperlipidemia 4.  PVD (Dr.  Donnetta Hutching)    Chief Complaint  Patient presents with  . Coronary Artery Disease      05/16/17  William Lee is a 73 y.o. male who presents for a six-month follow-up visit Seen for the first time today .  Transfer from [place] No angina Has chronic dyspnea  Feb.  11, 2019: S/p CABG ,   Hx of COPD , is on home O2, 2 liters / min  No CP ,  Chronic shortness of breath   July 09, 2018: William Lee is seen today for follow-up visit. He has a history of an inferior wall myocardial infarction in 1996.  He has a history of coronary artery bypass grafting.  He also has a history of severe COPD and is chronically short of breath. He is on Cardizem - EF is 40-45%   - EF increased slightly with the addition of the Losartan .  Ideally , we would not have him on Cardizem but he cannot take beta blockers. Due to his severe COPD .    Oct. 30, 2020  William Lee is Seen today for follow-up visit.  He has a history of coronary artery disease and also severe COPD.  He has mildly reduced left ventricular systolic function.  He is on Cardizem to help suppress his fast heart rate.  He does not tolerate beta-blockers.  More short of breath  Uses his home O2 at night and has a portable which he takes with him when he is out and about.  Sees pulmonary  No angina  Able to do normal activities.  Does not exercise  Able to do some yard work    Past Medical History:  Diagnosis Date  . Asthma   . Chest pain   . Community acquired pneumonia 11/12/2011  . COPD (chronic obstructive pulmonary disease) ([place])   . COPD, severe   . Coronary artery disease   . Coronary  artery disease involving native coronary artery of native heart without angina pectoris 05/16/2017  . Dyslipidemia   . GERD (gastroesophageal reflux disease)   . Hearing loss of both ears 09-11-13   bilateral hearing aids used  . Hyperlipidemia   . Hypertension   . Ischemic heart disease   . left Inguinal hernia 02/01/2012   Repaired 05/01/12   . MI (myocardial infarction) ([place])   . Mixed hyperlipidemia 05/16/2017  . Papilloma of larynx   . PONV (postoperative nausea and vomiting)   . Shortness of breath 11/11/2011  . SOB (shortness of breath) on exertion     Past Surgical History:  Procedure Laterality Date  . CARDIAC CATHETERIZATION    . CHOLECYSTECTOMY  12/1995  . COLONOSCOPY WITH PROPOFOL N/A 09/23/2013   Procedure: COLONOSCOPY WITH PROPOFOL;  Surgeon: Winfield Cunas., MD;  Location: [facility];  Service: Endoscopy;  Laterality: N/A;  . CORONARY ARTERY BYPASS GRAFT    . ESOPHAGOGASTRODUODENOSCOPY (EGD) WITH PROPOFOL N/A 09/23/2013   Procedure: ESOPHAGOGASTRODUODENOSCOPY (EGD) WITH PROPOFOL;  Surgeon: Winfield Cunas., MD;  [facility];  Service: Endoscopy;  Laterality: N/A;  . HERNIA REPAIR    . INGUINAL  HERNIA REPAIR  05/01/2012   Procedure: HERNIA REPAIR INGUINAL ADULT;  Surgeon: Haywood Lasso, MD;  Location: [facility];  Service: General;  Laterality: Left;  inguinal  . papalomas virus removal  3/10 7/10 5/11 10/12  . PERIPHERAL VASCULAR CATHETERIZATION N/A 04/19/2016   Procedure: Abdominal Aortogram w/Lower Extremity;  Surgeon: Rosetta Posner, MD;  Location: [facility];  Service: Cardiovascular;  Laterality: N/A;     Current Outpatient Medications  Medication Sig Dispense Refill  . albuterol (PROVENTIL HFA;VENTOLIN HFA) 108 (90 BASE) MCG/ACT inhaler Inhale 2 puffs into the lungs every 6 (six) hours as needed for wheezing.    Marland Kitchen albuterol (PROVENTIL) (5 MG/ML) 0.5% nebulizer solution Take 2.5 mg by nebulization every 6 (six) hours as  needed for wheezing or shortness of breath.     Marland Kitchen aspirin 81 MG tablet Take 81 mg by mouth daily.    Marland Kitchen atorvastatin (LIPITOR) 40 MG tablet Take 20 mg by mouth daily.     . budesonide-formoterol (SYMBICORT) 160-4.5 MCG/ACT inhaler Inhale 2 puffs into the lungs 2 (two) times daily.    . cyclobenzaprine (FLEXERIL) 10 MG tablet Take 10 mg by mouth 2 (two) times daily as needed for muscle spasms.     Marland Kitchen diltiazem (CARDIZEM CD) 120 MG 24 hr capsule Take 120 mg by mouth at bedtime.    . fluticasone (FLONASE) 50 MCG/ACT nasal spray Place 2 sprays into the nose daily as needed for rhinitis.     Marland Kitchen omeprazole (PRILOSEC) 20 MG capsule Take 40 mg by mouth 2 (two) times daily. Take 2 capsules in the morning and 2 capsules in the evening    . OXYGEN Inhale 2 L into the lungs as needed (During activity).    . potassium chloride 20 MEQ/15ML (10%) SOLN Take 20 mEq by mouth 2 (two) times daily.     . Tiotropium Bromide Monohydrate (SPIRIVA RESPIMAT) 2.5 MCG/ACT AERS Inhale 2 puffs into the lungs 2 (two) times daily.    Marland Kitchen triamterene-hydrochlorothiazide (MAXZIDE-25) 37.5-25 MG per tablet Take 1 tablet by mouth daily. Reported on 01/24/2016    . valsartan (DIOVAN) 40 MG tablet Take 1 tablet (40 mg total) by mouth daily. 90 tablet 3   No current facility-administered medications for this visit.     Allergies:   Silver and Tape    Social History:  The patient  reports that he quit smoking about 28 years ago. He has never used smokeless tobacco. He reports current drug use. Drug: Marijuana. He reports that he does not drink alcohol.   Family History:  The patient's family history includes Bone cancer in his father; Cancer in his brother; Liver cancer in his mother; Lung cancer in his sister.    ROS:  Please see the history of present illness.   Otherwise, review of systems are positive for none.   All other systems are reviewed and negative.   Physical Exam: Blood pressure 128/66, pulse 83, height 5\' 3"  (1.6 m),  weight 105 lb 1.9 oz (47.7 kg), SpO2 98 %.  GEN:  Chronically ill man,  NAD   HEENT: Normal NECK: No JVD; No carotid bruits LYMPHATICS: No lymphadenopathy CARDIAC: RRR , soft systolic murmur .  Distant heart sounds  RESPIRATORY:  Few rales, right base .  Reduced breath sounds in apeices.  ABDOMEN: Soft, non-tender, non-distended MUSCULOSKELETAL:  No edema; No deformity  SKIN: Warm and dry NEUROLOGIC:  Alert and oriented x 3    EKG:  August 29, 2019: Normal sinus rhythm at 83.  Previous inferior wall myocardial infarction. Recent Labs: No results found for requested labs within last 8760 hours.    Lipid Panel    Component Value Date/Time   CHOL 165 08/11/2015 1133   TRIG 56 08/11/2015 1133   HDL 65 08/11/2015 1133   CHOLHDL 2.5 08/11/2015 1133   VLDL 11 08/11/2015 1133   LDLCALC 89 08/11/2015 1133      Wt Readings from Last 3 Encounters:  08/29/19 105 lb 1.9 oz (47.7 kg)  07/09/18 110 lb (49.9 kg)  12/10/17 116 lb 12.8 oz (53 kg)     ASSESSMENT AND PLAN:  1. CAD with Inf. Mi ,  CABG in 1996.  No again     2.  Chronic systolic CHF:    Stable,  Able to do yard work   3. mild to moderate mitral regurgitation by echo.  4. Severe COPD -  Managed by pulmonary   6. Hypercholesterolemia -check lipids, liver enzymes, basic metabolic profile today.   Current medicines are reviewed at length with the patient today.  The patient does not have concerns regarding medicines.  The following changes have been made:  no change  Labs/ tests ordered today include:   Orders Placed This Encounter  Procedures  . Lipid Profile  . Basic Metabolic Panel (BMET)  . Hepatic function panel  . EKG 12-Lead   I'll see him in 1 year .     Mertie Moores, MD  08/29/2019 11:02 AM    [facility] [place],  [place], [place]  [address] Pager [phone] Phone: [phone]; Fax: [phone]

## 2019-08-29 ENCOUNTER — Other Ambulatory Visit: Payer: Self-pay

## 2019-08-29 ENCOUNTER — Ambulatory Visit (INDEPENDENT_AMBULATORY_CARE_PROVIDER_SITE_OTHER): Payer: Medicare Other | Admitting: Cardiovascular Disease

## 2019-08-29 ENCOUNTER — Encounter: Payer: Self-pay | Admitting: Cardiovascular Disease

## 2019-08-29 VITALS — BP 128/66 | HR 83 | Ht 63.0 in | Wt 105.1 lb

## 2019-08-29 DIAGNOSIS — J449 Chronic obstructive pulmonary disease, unspecified: Secondary | ICD-10-CM

## 2019-08-29 DIAGNOSIS — I2510 Atherosclerotic heart disease of native coronary artery without angina pectoris: Secondary | ICD-10-CM

## 2019-08-29 DIAGNOSIS — E782 Mixed hyperlipidemia: Secondary | ICD-10-CM

## 2019-08-29 LAB — HEPATIC FUNCTION PANEL
ALT: 11 IU/L (ref 0–44)
AST: 17 IU/L (ref 0–40)
Albumin: 4.4 g/dL (ref 3.7–4.7)
Alkaline Phosphatase: 78 IU/L (ref 39–117)
Bilirubin Total: 1.5 mg/dL — ABNORMAL HIGH (ref 0.0–1.2)
Bilirubin, Direct: 0.37 mg/dL (ref 0.00–0.40)
Total Protein: 6.7 g/dL (ref 6.0–8.5)

## 2019-08-29 LAB — BASIC METABOLIC PANEL
BUN/Creatinine Ratio: 13 (ref 10–24)
BUN: 17 mg/dL (ref 8–27)
CO2: 21 mmol/L (ref 20–29)
Calcium: 9.5 mg/dL (ref 8.6–10.2)
Chloride: 100 mmol/L (ref 96–106)
Creatinine, Ser: 1.27 mg/dL (ref 0.76–1.27)
GFR calc Af Amer: 64 mL/min/{1.73_m2} (ref >59)
GFR calc non Af Amer: 55 mL/min/{1.73_m2} — ABNORMAL LOW (ref >59)
Glucose: 97 mg/dL (ref 65–99)
Potassium: 4.8 mmol/L (ref 3.5–5.2)
Sodium: 137 mmol/L (ref 134–144)

## 2019-08-29 LAB — LIPID PANEL
Chol/HDL Ratio: 2.9 ratio (ref 0.0–5.0)
Cholesterol, Total: 164 mg/dL (ref 100–199)
HDL: 57 mg/dL (ref >39)
LDL Chol Calc (NIH): 85 mg/dL (ref 0–99)
Triglycerides: 126 mg/dL (ref 0–149)
VLDL Cholesterol Cal: 22 mg/dL (ref 5–40)

## 2019-08-29 NOTE — Patient Instructions (Signed)
Medication Instructions:  Your physician recommends that you continue on your current medications as directed. Please refer to the Current Medication list given to you today.  *If you need a refill on your cardiac medications before your next appointment, please call your pharmacy*  Lab Work: TODAY - cholesterol, liver panel, basic metabolic panel  If you have labs (blood work) drawn today and your tests are completely normal, you will receive your results only by: Marland Kitchen MyChart Message (if you have MyChart) OR . A paper copy in the mail If you have any lab test that is abnormal or we need to change your treatment, we will call you to review the results.   Testing/Procedures: None Ordered   Follow-Up: At [facility], you and your health needs are our priority.  As part of our continuing mission to provide you with exceptional heart care, we have created designated Provider Care Teams.  These Care Teams include your primary Cardiologist (physician) and Advanced Practice Providers (APPs -  Physician Assistants and Nurse Practitioners) who all work together to provide you with the care you need, when you need it.  Your next appointment:   12 months  The format for your next appointment:   In Person  Provider:   You may see Mertie Moores, MD or one of the following Advanced Practice Providers on your designated Care Team:    Richardson Dopp, PA-C  [place], [place]  Daune Perch, [place]

## 2019-09-05 ENCOUNTER — Other Ambulatory Visit: Payer: Self-pay | Admitting: Pulmonary Disease

## 2019-09-05 DIAGNOSIS — R918 Other nonspecific abnormal finding of lung field: Secondary | ICD-10-CM

## 2019-09-15 ENCOUNTER — Other Ambulatory Visit: Payer: Self-pay

## 2019-09-15 ENCOUNTER — Ambulatory Visit
Admission: RE | Admit: 2019-09-15 | Discharge: 2019-09-15 | Disposition: A | Discharge status: Home / Self Care | Payer: Medicare Other | Source: Ambulatory Visit | Attending: Pulmonary Disease | Admitting: Pulmonary Disease

## 2019-09-15 DIAGNOSIS — R918 Other nonspecific abnormal finding of lung field: Secondary | ICD-10-CM

## 2019-10-13 ENCOUNTER — Other Ambulatory Visit: Payer: Self-pay | Admitting: Gastroenterology

## 2019-10-13 DIAGNOSIS — Z86010 Personal history of colonic polyps: Secondary | ICD-10-CM

## 2019-10-13 DIAGNOSIS — R948 Abnormal results of function studies of other organs and systems: Secondary | ICD-10-CM

## 2019-10-30 ENCOUNTER — Ambulatory Visit
Admission: RE | Admit: 2019-10-30 | Discharge: 2019-10-30 | Disposition: A | Discharge status: Home / Self Care | Payer: Medicare Other | Source: Ambulatory Visit | Attending: Gastroenterology | Admitting: Gastroenterology

## 2019-10-30 DIAGNOSIS — Z86010 Personal history of colonic polyps: Secondary | ICD-10-CM

## 2019-10-30 DIAGNOSIS — R948 Abnormal results of function studies of other organs and systems: Secondary | ICD-10-CM

## 2020-03-12 IMAGING — MR MRI HEAD WITH CONTRAST
5 series · 46 of 48 positions shown · IV contrast (10 ml multihance)
Comparison: MR brain 01/23/2019.

CLINICAL DATA: RIGHT-sided headaches.  Head pain and tenderness.

EXAM:
MRI HEAD WITH CONTRAST
TECHNIQUE: Multiplanar, multiecho pulse sequences of the brain and surrounding
structures were obtained with intravenous contrast.
CONTRAST:  10mL MULTIHANCE GADOBENATE DIMEGLUMINE 529 MG/ML IV SOLN

[Series 2: t1_se_sag pre · sagittal · non-contrast · 5.0mm · 0.45mm/px · 3 of 25 slices shown]
[im 1/25]
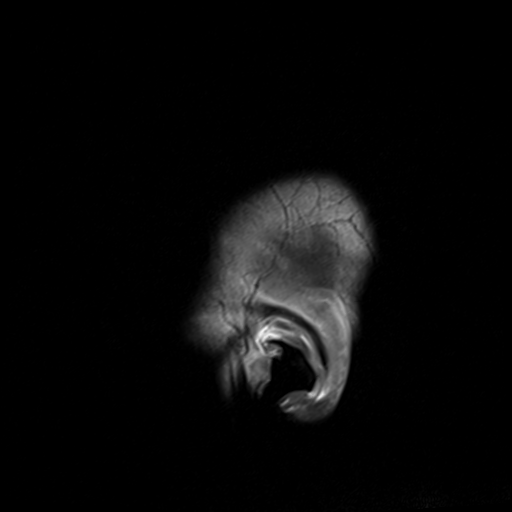
[im 13/25]
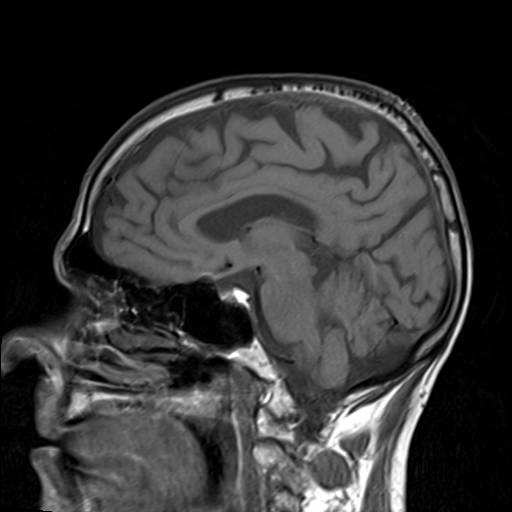
[im 25/25]
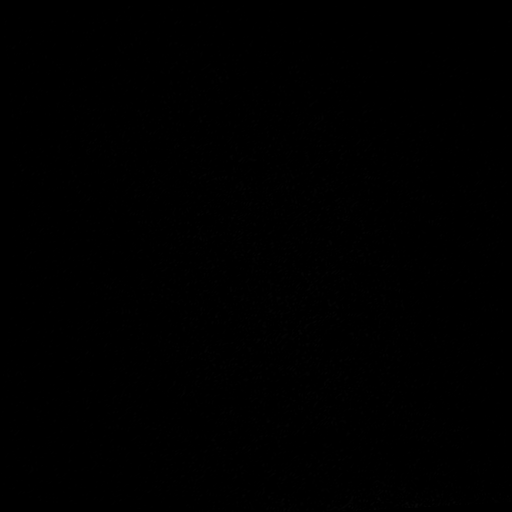

[Series 3: t1_mpr_tra pre · axial · non-contrast · 1.0mm · 0.72mm/px · z∈[-60,+99]mm · 19 of 160 slices shown]
[im 1/160]
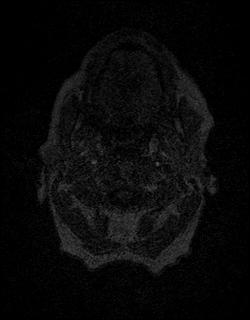
[im 9/160]
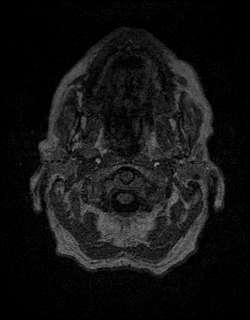
[im 18/160]
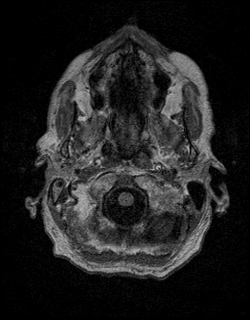
[im 27/160]
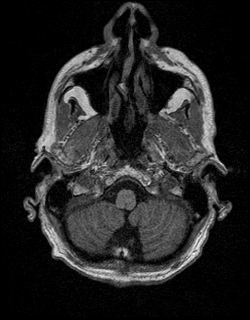
[im 36/160]
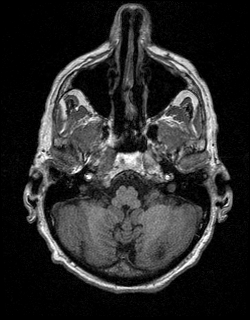
[im 45/160]
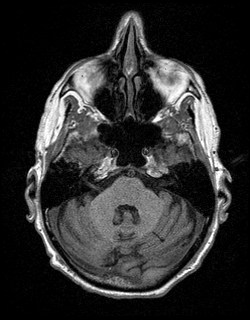
[im 54/160]
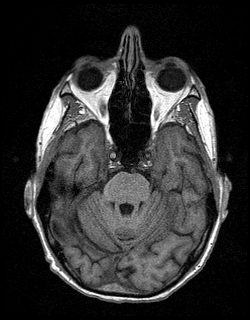
[im 62/160]
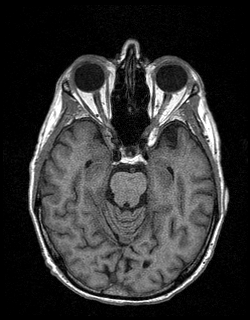
[im 71/160]
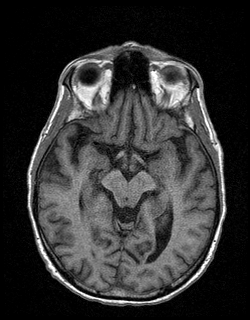
[im 80/160]
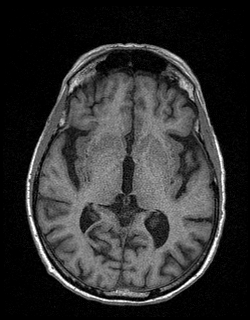
[im 89/160]
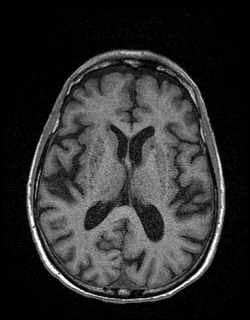
[im 98/160]
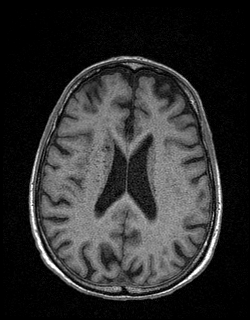
[im 107/160]
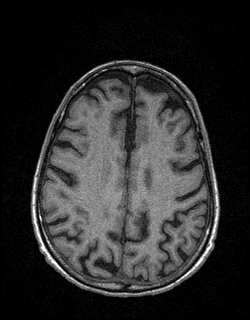
[im 115/160]
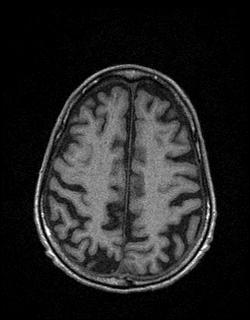
[im 124/160]
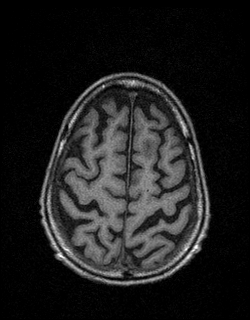
[im 133/160]
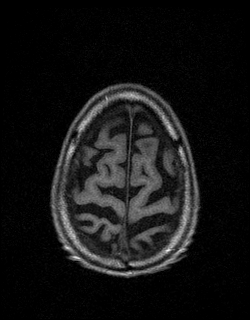
[im 142/160]
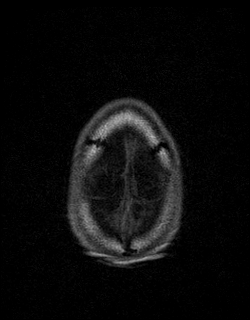
[im 151/160]
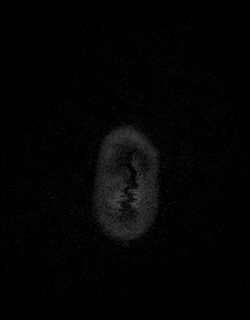
[im 160/160]
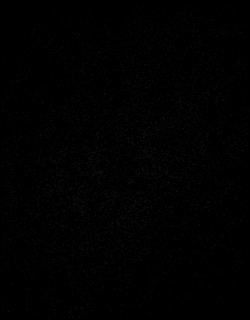

[Series 4: T1 post-contrast · coronal · 5.0mm · 0.72mm/px · 4 of 32 slices shown]
[im 1/32]
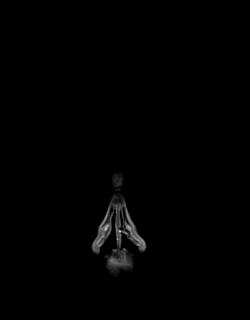
[im 11/32]
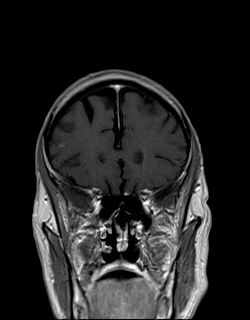
[im 21/32]
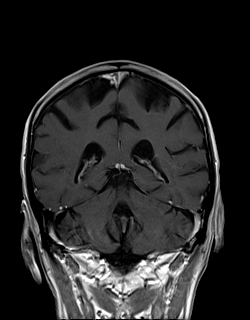
[im 32/32]
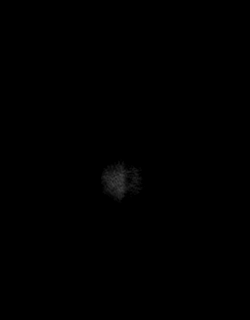

[Series 5: post t1_mpr_tra · axial · 1.0mm · 0.72mm/px · z∈[-60,+90]mm · 17 of 160 slices shown]
[im 1/160]
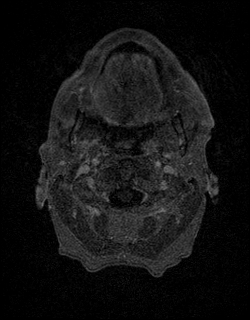
[im 9/160]
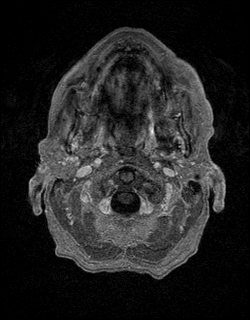
[im 18/160]
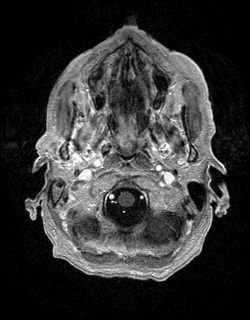
[im 27/160]
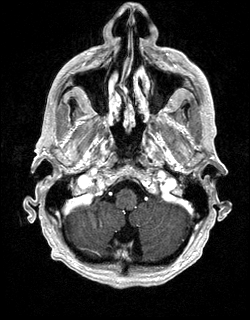
[im 36/160]
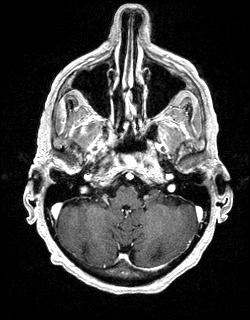
[im 45/160]
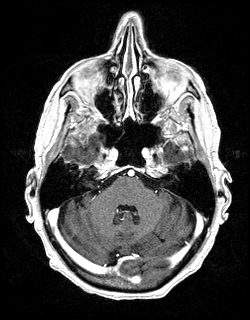
[im 54/160]
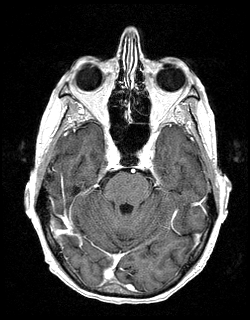
[im 62/160]
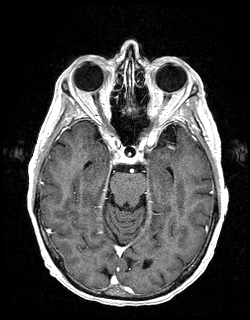
[im 71/160]
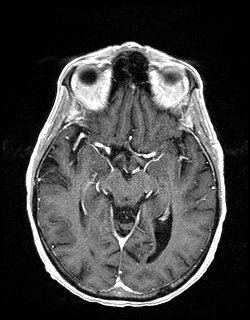
[im 80/160]
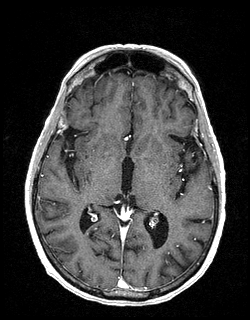
[im 89/160]
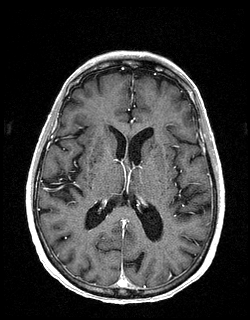
[im 98/160]
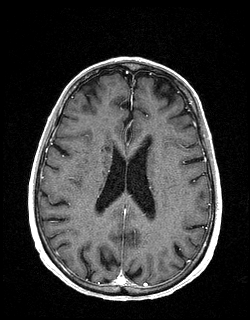
[im 107/160]
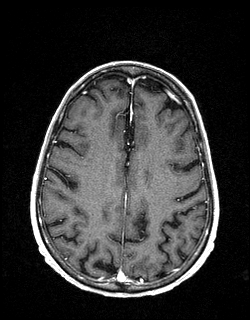
[im 115/160]
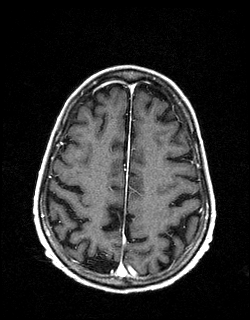
[im 124/160]
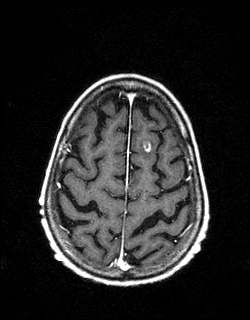
[im 133/160]
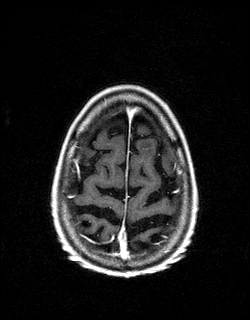
[im 151/160]
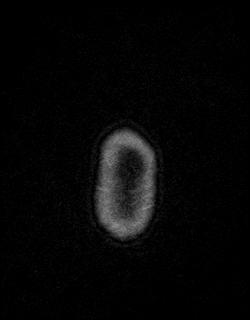

[Series 6: t1_se_sag post · sagittal · 5.0mm · 0.45mm/px · 3 of 26 slices shown]
[im 1/26]
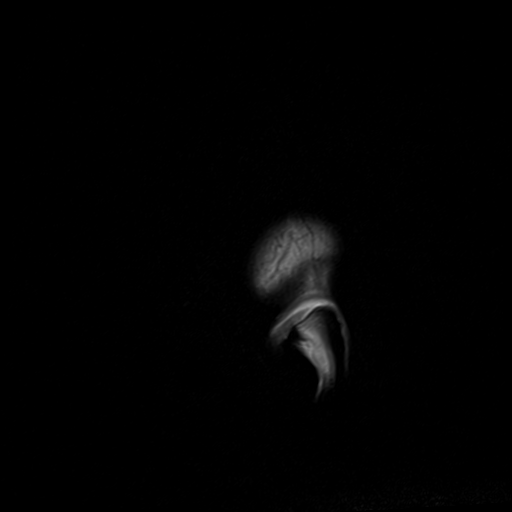
[im 13/26]
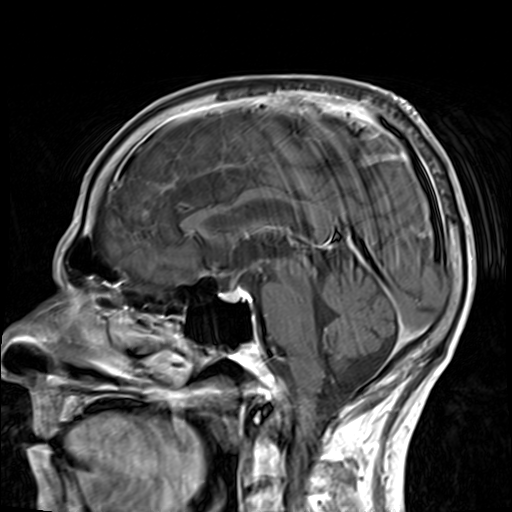
[im 26/26]
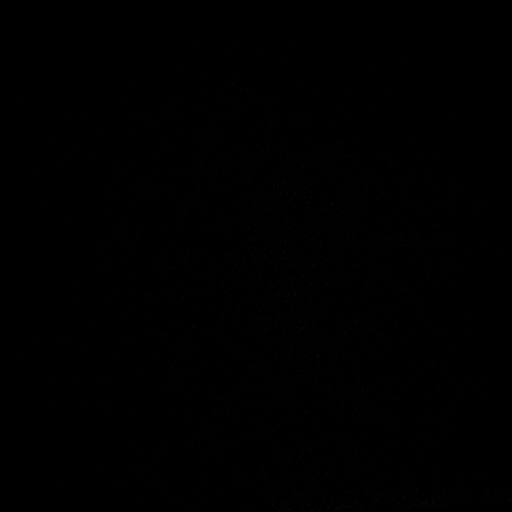

[46 of 48 positions shown; findings below may reference images not displayed]

FINDINGS: Brain: Post infusion, there is abnormal peripheral enhancement of
the LEFT frontal subcortical white matter lesion near the vertex,
observed on 01/23/2019. The enhancement is greatest inferiorly, but
does not completely surround the lesion. The lesion is triangular in
shape with apex toward the ventricle. Centrally the lesion is not
clearly restricted, and the peripheral enhancement measures up to 2
mm in thickness, and is greatest along the most inferior margin.
Although T2 weighted images were not obtained, vasogenic edema
continues to surround the lesion and appears grossly unchanged. No
leptomeningeal or pachymeningeal enhancement. No other similar areas
of enhancement

Vascular: Unchanged and negative.

Skull and upper cervical spine: Unremarkable

Sinuses/Orbits: No paranasal sinus disease.  Negative orbits.

Other: Unremarkable.
IMPRESSION: The LEFT frontal subcortical white matter lesion discovered on
01/23/2019 demonstrates peripheral but incomplete enhancement.
Despite the lack of clear restricted diffusion on the previous scan,
given the location, and moderate vasogenic edema,
cerebritis/developing brain abscess is favored. Unusual primary
brain tumor or metastatic disease is not excluded.

Neurosurgical consultation is warranted.

A call has been placed to the ordering provider.

## 2020-04-10 ENCOUNTER — Other Ambulatory Visit: Payer: Self-pay | Admitting: Cardiovascular Disease

## 2020-08-11 ENCOUNTER — Encounter (HOSPITAL_COMMUNITY): Payer: Self-pay | Admitting: *Deleted

## 2020-08-11 NOTE — Progress Notes (Signed)
Received referral notification from Stevie Kern PA at [facility]  from for this pt to participate in pulmonary rehab with the the diagnosis of multiple lung nodules Clinical review of pt follow up appt on *10/13 with Dr. Elenor Quinones Thoracic surgeon at [facility]  office note. Also reviewed Dr. Delma Freeze pulmonary office note.  Pt has completed pulmonary rehab in 2017.Pt with Covid Risk Score - 5.  Sent request for pulmonary rehab referral order to be signed by MD.  Once received back, pt will be contacted for scheduling.  Will have support staff contact pt for insurance benefits as well as referral process. Pt appropriate for scheduling for Pulmonary rehab. Cherre Huger, BSN Cardiac and Training and development officer

## 2020-08-16 ENCOUNTER — Telehealth (HOSPITAL_COMMUNITY): Payer: Self-pay

## 2020-08-17 NOTE — Telephone Encounter (Signed)
I called [facility], who sent pt pulmonary rehab referral to advise them that the referral that was sent over needed a MD signature and not a PA. They stated that Dr. Elenor Quinones will be in office on 08/18/2020 and they will get him to sign the order and they will fax it back. I advised the pt of this information he understood.

## 2020-09-01 ENCOUNTER — Encounter: Payer: Self-pay | Admitting: Cardiovascular Disease

## 2020-09-01 NOTE — Progress Notes (Signed)
Cardiology Office Note   Date:  09/01/2020   ID:  William Lee, DOB 07/28/1946, MRN [id number]  PCP:  Leonard Downing, MD  Cardiologist: Darlin Coco MD - now Jefferson Fullam   Problem list 1. Coronary artery disease-status post inferior wall myocardial infarction - CABG 1996.  2.  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  Hyperlipidemia 4.  PVD (Dr.  Donnetta Hutching)    Chief Complaint  Patient presents with  . Coronary Artery Disease      05/16/17  William Lee is a 74 y.o. male who presents for a six-month follow-up visit Seen for the first time today .  Transfer from [place] No angina Has chronic dyspnea  Feb.  11, 2019: S/p CABG ,   Hx of COPD , is on home O2, 2 liters / min  No CP ,  Chronic shortness of breath   July 09, 2018: William Lee is seen today for follow-up visit. He has a history of an inferior wall myocardial infarction in 1996.  He has a history of coronary artery bypass grafting.  He also has a history of severe COPD and is chronically short of breath. He is on Cardizem - EF is 40-45%   - EF increased slightly with the addition of the Losartan .  Ideally , we would not have him on Cardizem but he cannot take beta blockers. Due to his severe COPD .    Oct. 30, 2020  William Lee is Seen today for follow-up visit.  He has a history of coronary artery disease and also severe COPD.  He has mildly reduced left ventricular systolic function.  He is on Cardizem to help suppress his fast heart rate.  He does not tolerate beta-blockers.  More short of breath  Uses his home O2 at night and has a portable which he takes with him when he is out and about.  Sees pulmonary  No angina  Able to do normal activities.  Does not exercise  Able to do some yard work    Nov. 4. 2021: William Lee is seen today for follow up of his CAD ., PAD  Has COPD . Was diagnosed with lung cancer last month,  Had quit smoking years ago  Does not have a diagnosis yet,  Would need a partial pneumonectomy which  his doctor does not think he would tolerate.   Getting repeat CT scan in Jan. 2022.  Is very stable from a cardiac standpont.  No angina . He is at low risk from a cardiac standpoint to have any biopsy or procedure needed.  Has multiple family members have died of cancer.      Past Medical History:  Diagnosis Date  . Asthma   . Chest pain   . Community acquired pneumonia 11/12/2011  . COPD (chronic obstructive pulmonary disease) ([place])   . COPD, severe   . Coronary artery disease   . Coronary artery disease involving native coronary artery of native heart without angina pectoris 05/16/2017  . Dyslipidemia   . GERD (gastroesophageal reflux disease)   . Hearing loss of both ears 09-11-13   bilateral hearing aids used  . Hyperlipidemia   . Hypertension   . Ischemic heart disease   . left Inguinal hernia 02/01/2012   Repaired 05/01/12   . MI (myocardial infarction) ([place])   . Mixed hyperlipidemia 05/16/2017  . Papilloma of larynx   . PONV (postoperative nausea and vomiting)   . Shortness of breath 11/11/2011  . SOB (shortness  of breath) on exertion     Past Surgical History:  Procedure Laterality Date  . CARDIAC CATHETERIZATION    . CHOLECYSTECTOMY  12/1995  . COLONOSCOPY WITH PROPOFOL N/A 09/23/2013   Procedure: COLONOSCOPY WITH PROPOFOL;  Surgeon: Winfield Cunas., MD;  [facility];  Service: Endoscopy;  Laterality: N/A;  . CORONARY ARTERY BYPASS GRAFT    . ESOPHAGOGASTRODUODENOSCOPY (EGD) WITH PROPOFOL N/A 09/23/2013   Procedure: ESOPHAGOGASTRODUODENOSCOPY (EGD) WITH PROPOFOL;  Surgeon: Winfield Cunas., MD;  [facility];  Service: Endoscopy;  Laterality: N/A;  . HERNIA REPAIR    . INGUINAL HERNIA REPAIR  05/01/2012   Procedure: HERNIA REPAIR INGUINAL ADULT;  Surgeon: Haywood Lasso, MD;  Location: [facility];  Service: General;  Laterality: Left;  inguinal  . papalomas virus removal  3/10 7/10 5/11 10/12  . PERIPHERAL VASCULAR  CATHETERIZATION N/A 04/19/2016   Procedure: Abdominal Aortogram w/Lower Extremity;  Surgeon: Rosetta Posner, MD;  Location: [facility];  Service: Cardiovascular;  Laterality: N/A;     Current Outpatient Medications  Medication Sig Dispense Refill  . albuterol (PROVENTIL HFA;VENTOLIN HFA) 108 (90 BASE) MCG/ACT inhaler Inhale 2 puffs into the lungs every 6 (six) hours as needed for wheezing.    Marland Kitchen albuterol (PROVENTIL) (5 MG/ML) 0.5% nebulizer solution Take 2.5 mg by nebulization every 6 (six) hours as needed for wheezing or shortness of breath.     Marland Kitchen aspirin 81 MG tablet Take 81 mg by mouth daily.    Marland Kitchen atorvastatin (LIPITOR) 40 MG tablet Take 20 mg by mouth daily.     . budesonide-formoterol (SYMBICORT) 160-4.5 MCG/ACT inhaler Inhale 2 puffs into the lungs 2 (two) times daily.    . cyclobenzaprine (FLEXERIL) 10 MG tablet Take 10 mg by mouth 2 (two) times daily as needed for muscle spasms.     Marland Kitchen diltiazem (CARDIZEM CD) 120 MG 24 hr capsule Take 120 mg by mouth at bedtime.    . fluticasone (FLONASE) 50 MCG/ACT nasal spray Place 2 sprays into the nose daily as needed for rhinitis.     Marland Kitchen omeprazole (PRILOSEC) 20 MG capsule Take 40 mg by mouth 2 (two) times daily. Take 2 capsules in the morning and 2 capsules in the evening    . OXYGEN Inhale 2 L into the lungs as needed (During activity).    . potassium chloride 20 MEQ/15ML (10%) SOLN Take 20 mEq by mouth 2 (two) times daily.     . Tiotropium Bromide Monohydrate (SPIRIVA RESPIMAT) 2.5 MCG/ACT AERS Inhale 2 puffs into the lungs 2 (two) times daily.    Marland Kitchen triamterene-hydrochlorothiazide (MAXZIDE-25) 37.5-25 MG per tablet Take 1 tablet by mouth daily. Reported on 01/24/2016    . valsartan (DIOVAN) 40 MG tablet Take 1 tablet (40 mg total) by mouth daily. Pt  Needs to make appt with provider by Oct for further refills 90 tablet 1   No current facility-administered medications for this visit.    Allergies:   Silver and Tape    Social History:  The  patient  reports that he quit smoking about 29 years ago. He has never used smokeless tobacco. He reports current drug use. Drug: Marijuana. He reports that he does not drink alcohol.   Family History:  The patient's family history includes Bone cancer in his father; Cancer in his brother; Liver cancer in his mother; Lung cancer in his sister.    ROS:  Please see the history of present illness.  Otherwise, review of systems are positive for none.   All other systems are reviewed and negative.    Physical Exam: There were no vitals taken for this visit.  GEN:  Elderly , chronically man,  Slight muscle tremor.  HEENT: Normal NECK: No JVD; No carotid bruits LYMPHATICS: No lymphadenopathy CARDIAC: RRR , no murmurs, rubs, gallops RESPIRATORY:  Clear to auscultation without rales, wheezing or rhonchi  ABDOMEN: Soft, non-tender, non-distended MUSCULOSKELETAL:  No edema; No deformity  SKIN: Warm and dry NEUROLOGIC:  Alert and oriented x 3   EKG:     September 02, 2020: Normal sinus rhythm at 84.  Nonspecific ST and T wave changes.   Recent Labs: No results found for requested labs within last 8760 hours.    Lipid Panel    Component Value Date/Time   CHOL 164 08/29/2019 1105   TRIG 126 08/29/2019 1105   HDL 57 08/29/2019 1105   CHOLHDL 2.9 08/29/2019 1105   CHOLHDL 2.5 08/11/2015 1133   VLDL 11 08/11/2015 1133   LDLCALC 85 08/29/2019 1105      Wt Readings from Last 3 Encounters:  08/29/19 105 lb 1.9 oz (47.7 kg)  07/09/18 110 lb (49.9 kg)  12/10/17 116 lb 12.8 oz (53 kg)     ASSESSMENT AND PLAN:  1. CAD with Inf. Mi ,  CABG in 1996.  No angina     2.  Chronic systolic CHF:    Stable   3. mild to moderate mitral regurgitation by echo.  4. Severe COPD -   6. Hypercholesterolemia -  Current medicines are reviewed at length with the patient today.  The patient does not have concerns regarding medicines.  The following changes have been made:  no change  Labs/  tests ordered today include:   No orders of the defined types were placed in this encounter.  I'll see him in 1 year .     Mertie Moores, MD  09/01/2020 12:46 PM    [facility],  [place], [place]  [address] Pager [phone] Phone: [phone]; Fax: [phone]

## 2020-09-02 ENCOUNTER — Encounter: Payer: Self-pay | Admitting: Cardiovascular Disease

## 2020-09-02 ENCOUNTER — Other Ambulatory Visit: Payer: Self-pay

## 2020-09-02 ENCOUNTER — Ambulatory Visit (INDEPENDENT_AMBULATORY_CARE_PROVIDER_SITE_OTHER): Payer: Medicare Other | Admitting: Cardiovascular Disease

## 2020-09-02 VITALS — BP 126/56 | HR 84 | Ht 63.0 in | Wt 101.4 lb

## 2020-09-02 DIAGNOSIS — I2510 Atherosclerotic heart disease of native coronary artery without angina pectoris: Secondary | ICD-10-CM

## 2020-09-02 DIAGNOSIS — J449 Chronic obstructive pulmonary disease, unspecified: Secondary | ICD-10-CM | POA: Diagnosis not present

## 2020-09-02 DIAGNOSIS — E782 Mixed hyperlipidemia: Secondary | ICD-10-CM

## 2020-09-02 NOTE — Patient Instructions (Signed)

## 2020-09-17 ENCOUNTER — Encounter (HOSPITAL_COMMUNITY): Payer: Self-pay

## 2020-09-17 ENCOUNTER — Encounter (HOSPITAL_COMMUNITY)
Admission: RE | Admit: 2020-09-17 | Discharge: 2020-09-17 | Disposition: A | Discharge status: Home / Self Care | Payer: Medicare Other | Source: Ambulatory Visit | Attending: Cardiology | Admitting: Cardiology

## 2020-09-17 ENCOUNTER — Other Ambulatory Visit: Payer: Self-pay

## 2020-09-17 VITALS — BP 130/70 | HR 100 | Ht 61.5 in | Wt 103.1 lb

## 2020-09-17 DIAGNOSIS — J438 Other emphysema: Secondary | ICD-10-CM | POA: Diagnosis not present

## 2020-09-17 NOTE — Progress Notes (Signed)
William Lee 74 y.o. male Pulmonary Rehab Orientation Note William Lee who is known to the pulmonary rehab staff with previous participation in 2017, who was referred to Pulmonary rehab by [facility] with the diagnosis of Emphysema and Multiple lung nodule.  [employer] is his MDE. Per pt, he uses oxygen continuously on exertion and during sleep and no oxygen at rest. Color good, skin warm and dry. Patient is oriented to time and place. Patient's medical history, psychosocial health, and medications reviewed. Psychosocial assessment reveals pt lives with their spouse. Pt is currently retired. Pt hobbies include watching TV. Pt reports his stress level is moderate. Areas of stress/anxiety include Family. Pt stress is related to his wife's family dynamics with the recent passing of his mother in law.  Pt does not exhibit  signs of depression. PHQ2/9 score 0/4. Pt shows good  coping skills with positive outlook . William Lee offered emotional support and reassurance.  Pt aware that he has declined with his activity from previous participation in 2017. Will continue to monitor and evaluate progress toward psychosocial goal(s) of developing positive and healthy coping skills with stress. Physical assessment reveals heart rate is slightly irregular, breath sounds diminished upper and mid lobes and air exchange lower lobes clear but diminshed. Grip strength equal, strong. Distal pulses palpable. Patient reports he** does take medications as prescribed. Patient states he  Regular diet with one ensure complete one can. The patient has been trying to gain weight by Ensure complete meal replacement along with two meals a day.. Patient's weight will be monitored closely. Demonstration and practice of PLB using pulse oximeter. Patient able to return demonstration satisfactorily. Safety and hand hygiene in the exercise area reviewed with patient. Patient voices understanding of the information reviewed. Department expectations discussed with  patient and achievable goals were set. The patient shows enthusiasm about attending the program and we look forward to working with this nice gentleman. William Lee is eager to get started  and will  begin exercise on 11/23.  45 minutes was spent on a variety of activities such as assessment of the patient, obtaining baseline data including height, weight, BMI, and grip strength, verifying medical history, allergies, and current medications, and teaching patient strategies for performing tasks with less respiratory effort with emphasis on pursed lip breathing. Cherre Huger, BSN Cardiac and Training and development officer

## 2020-09-17 NOTE — Progress Notes (Signed)
Pulmonary Individual Treatment Plan  Patient Details  Name: William Lee MRN: [id number] Date of Birth: 05-Dec-1945 Referring Provider:     Pulmonary Rehab Walk Test from 09/17/2020 in [facility]  Referring Provider [name], MD      Initial Encounter Date:    Pulmonary Rehab Walk Test from 09/17/2020 in [facility]  Date 09/17/20      Visit Diagnosis: Other emphysema ([place])  Patient's Home Medications on Admission:   Current Outpatient Medications:  .  albuterol (PROVENTIL HFA;VENTOLIN HFA) 108 (90 BASE) MCG/ACT inhaler, Inhale 2 puffs into the lungs every 6 (six) hours as needed for wheezing., Disp: , Rfl:  .  aspirin 81 MG tablet, Take 81 mg by mouth daily., Disp: , Rfl:  .  atorvastatin (LIPITOR) 40 MG tablet, Take 20 mg by mouth daily. , Disp: , Rfl:  .  budesonide-formoterol (SYMBICORT) 160-4.5 MCG/ACT inhaler, Inhale 2 puffs into the lungs 2 (two) times daily. Until completed on 11/24 then will switch to wixela, Disp: , Rfl:  .  cyclobenzaprine (FLEXERIL) 10 MG tablet, Take 10 mg by mouth 2 (two) times daily as needed for muscle spasms. , Disp: , Rfl:  .  diltiazem (CARDIZEM CD) 120 MG 24 hr capsule, Take 120 mg by mouth at bedtime., Disp: , Rfl:  .  fluticasone (FLONASE) 50 MCG/ACT nasal spray, Place 2 sprays into the nose daily as needed for rhinitis. , Disp: , Rfl:  .  omeprazole (PRILOSEC) 20 MG capsule, Take 40 mg by mouth 2 (two) times daily. Take 2 capsules in the morning and 2 capsules in the evening, Disp: , Rfl:  .  OXYGEN, Inhale 2 L into the lungs as needed (During activity)., Disp: , Rfl:  .  potassium chloride 20 MEQ/15ML (10%) SOLN, Take 20 mEq by mouth 2 (two) times daily. , Disp: , Rfl:  .  SPIRIVA HANDIHALER 18 MCG inhalation capsule, Place 1 capsule into inhaler and inhale daily., Disp: , Rfl:  .  triamterene-hydrochlorothiazide (MAXZIDE-25) 37.5-25 MG per tablet, Take 1 tablet by mouth daily.  Reported on 01/24/2016, Disp: , Rfl:  .  valsartan (DIOVAN) 40 MG tablet, Take 1 tablet (40 mg total) by mouth daily. Pt  Needs to make appt with provider by Oct for further refills, Disp: 90 tablet, Rfl: 1 .  [START ON 09/23/2020] Fluticasone-Salmeterol (WIXELA INHUB) 250-50 MCG/DOSE AEPB, Inhale 1 puff into the lungs 2 (two) times daily. Per the [facility] PCP - Duggal Patient will complete Symbicort until bottle empty then will switch to wixela on 09/23/20 (Patient not taking: Reported on 09/17/2020), Disp: , Rfl:   Past Medical History: Past Medical History:  Diagnosis Date  . Asthma   . Chest pain   . Community acquired pneumonia 11/12/2011  . COPD (chronic obstructive pulmonary disease) ([place])   . COPD, severe   . Coronary artery disease   . Coronary artery disease involving native coronary artery of native heart without angina pectoris 05/16/2017  . Dyslipidemia   . GERD (gastroesophageal reflux disease)   . Hearing loss of both ears 09-11-13   bilateral hearing aids used  . Hyperlipidemia   . Hypertension   . Ischemic heart disease   . left Inguinal hernia 02/01/2012   Repaired 05/01/12   . MI (myocardial infarction) ([place])   . Mixed hyperlipidemia 05/16/2017  . Papilloma of larynx   . PONV (postoperative nausea and vomiting)   . Shortness of breath 11/11/2011  . SOB (  shortness of breath) on exertion     Tobacco Use: Social History   Tobacco Use  Smoking Status Former Smoker  . Quit date: 10/30/1990  . Years since quitting: 29.9  Smokeless Tobacco Never Used    Labs: Recent Review Flowsheet Data    Labs for ITP Cardiac and Pulmonary Rehab Latest Ref Rng & Units 07/22/2014 01/21/2015 08/11/2015 04/19/2016 08/29/2019   Cholestrol 100 - 199 mg/dL 161 159 165 - 164   LDLCALC 0 - 99 mg/dL 87 79 89 - 85   HDL >39 mg/dL 50.10 49.10 65 - 57   Trlycerides 0 - 149 mg/dL 119.0 153.0(H) 56 - 126   Hemoglobin A1c <5.7 % - - - - -   TCO2 0 - 100 mmol/L - - - 27 -      Capillary Blood  Glucose: Lab Results  Component Value Date   GLUCAP 116 (H) 02/07/2019   GLUCAP 146 (H) 11/12/2011     Pulmonary Assessment Scores:  Pulmonary Assessment Scores    Row Name 09/17/20 1127         ADL UCSD   SOB Score total 67  Pre       CAT Score   CAT Score Pre 26       mMRC Score   mMRC Score 4           UCSD: Self-administered rating of dyspnea associated with activities of daily living (ADLs) 6-point scale (0 = "not at all" to 5 = "maximal or unable to do because of breathlessness")  Scoring Scores range from 0 to 120.  Minimally important difference is 5 units  CAT: CAT can identify the health impairment of COPD patients and is better correlated with disease progression.  CAT has a scoring range of zero to 40. The CAT score is classified into four groups of low (less than 10), medium (10 - 20), high (21-30) and very high (31-40) based on the impact level of disease on health status. A CAT score over 10 suggests significant symptoms.  A worsening CAT score could be explained by an exacerbation, poor medication adherence, poor inhaler technique, or progression of COPD or comorbid conditions.  CAT MCID is 2 points  mMRC: mMRC (Modified Medical Research Council) Dyspnea Scale is used to assess the degree of baseline functional disability in patients of respiratory disease due to dyspnea. No minimal important difference is established. A decrease in score of 1 point or greater is considered a positive change.   Pulmonary Function Assessment:   Exercise Target Goals: Exercise Program Goal: Individual exercise prescription set using results from initial 6 min walk test and THRR while considering  patient's activity barriers and safety.   Exercise Prescription Goal: Initial exercise prescription builds to 30-45 minutes a day of aerobic activity, 2-3 days per week.  Home exercise guidelines will be given to patient during program as part of exercise prescription that the  participant will acknowledge.  Activity Barriers & Risk Stratification:  Activity Barriers & Cardiac Risk Stratification - 09/17/20 1350      Activity Barriers & Cardiac Risk Stratification   Comments tremors           6 Minute Walk:  6 Minute Walk    Row Name 09/17/20 1045         6 Minute Walk   Phase Initial     Distance 1000 feet     Walk Time 6 minutes     # of Rest Breaks 0  MPH 1.9     METS 3     RPE 14     Perceived Dyspnea  3     VO2 Peak 10.5     Symptoms Yes (comment)     Comments Bilateral calf pain 5/10. Left buttock pain 7/10, SOB RPD = 3     Resting HR 100 bpm     Resting BP 130/60     Resting Oxygen Saturation  100 %     Exercise Oxygen Saturation  during 6 min walk 97 %     Max Ex. HR 123 bpm     Max Ex. BP 172/74     2 Minute Post BP 150/70       Interval HR   1 Minute HR 118     2 Minute HR 120     3 Minute HR 120     4 Minute HR 120     5 Minute HR 122     6 Minute HR 123     2 Minute Post HR 110       Interval Oxygen   Baseline Oxygen Saturation % 100 %     1 Minute Oxygen Saturation % 99 %     1 Minute Liters of Oxygen 2 L     2 Minute Oxygen Saturation % 97 %     2 Minute Liters of Oxygen 2 L     3 Minute Oxygen Saturation % 97 %     3 Minute Liters of Oxygen 2 L     4 Minute Oxygen Saturation % 97 %     4 Minute Liters of Oxygen 2 L     5 Minute Oxygen Saturation % 97 %     5 Minute Liters of Oxygen 2 L     6 Minute Oxygen Saturation % 97 %     6 Minute Liters of Oxygen 2 L     2 Minute Post Oxygen Saturation % 99 %     2 Minute Post Liters of Oxygen 2 L            Oxygen Initial Assessment:  Oxygen Initial Assessment - 09/17/20 0942      Home Oxygen   Home Oxygen Device Home Concentrator;Portable Concentrator   [employer]   Sleep Oxygen Prescription Continuous    Liters per minute 2    Home Exercise Oxygen Prescription Continuous   uses pulse 2L   Liters per minute 2    Home Resting Oxygen Prescription None     Compliance with Home Oxygen Use Yes      Initial 6 min Walk   Oxygen Used Continuous    Liters per minute 2      Program Oxygen Prescription   Program Oxygen Prescription Continuous    Liters per minute 2      Intervention   Short Term Goals To learn and exhibit compliance with exercise, home and travel O2 prescription;To learn and understand importance of monitoring SPO2 with pulse oximeter and demonstrate accurate use of the pulse oximeter.;To learn and understand importance of maintaining oxygen saturations>88%;To learn and demonstrate proper pursed lip breathing techniques or other breathing techniques.;To learn and demonstrate proper use of respiratory medications    Long  Term Goals Exhibits compliance with exercise, home and travel O2 prescription;Verbalizes importance of monitoring SPO2 with pulse oximeter and return demonstration;Maintenance of O2 saturations>88%;Exhibits proper breathing techniques, such as pursed lip breathing or other method taught during program session;Compliance with respiratory medication;Demonstrates  proper use of MDI's           Oxygen Re-Evaluation:   Oxygen Discharge (Final Oxygen Re-Evaluation):   Initial Exercise Prescription:  Initial Exercise Prescription - 09/17/20 1300      Date of Initial Exercise RX and Referring Provider   Date 09/17/20    Referring Provider [name], MD    Expected Discharge Date 11/18/20      Oxygen   Oxygen Continuous    Liters 2      NuStep   Level 2    SPM 75    Minutes 25    METs 2      Prescription Details   Frequency (times per week) 2    Duration Progress to 30 minutes of continuous aerobic without signs/symptoms of physical distress      Intensity   THRR 40-80% of Max Heartrate 58-116    Ratings of Perceived Exertion 11-13    Perceived Dyspnea 0-4      Progression   Progression Continue progressive overload as per policy without signs/symptoms or physical distress.      Resistance  Training   Training Prescription Yes    Weight blue bands    Reps 10-15           Perform Capillary Blood Glucose checks as needed.  Exercise Prescription Changes:   Exercise Comments:   Exercise Goals and Review:  Exercise Goals    Row Name 09/17/20 0941             Exercise Goals   Increase Physical Activity Yes       Expected Outcomes Long Term: Exercising regularly at least 3-5 days a week.;Long Term: Add in home exercise to make exercise part of routine and to increase amount of physical activity.;Short Term: Attend rehab on a regular basis to increase amount of physical activity.       Increase Strength and Stamina Yes       Expected Outcomes Short Term: Increase workloads from initial exercise prescription for resistance, speed, and METs.;Short Term: Perform resistance training exercises routinely during rehab and add in resistance training at home;Long Term: Improve cardiorespiratory fitness, muscular endurance and strength as measured by increased METs and functional capacity (6MWT)       Able to understand and use rate of perceived exertion (RPE) scale Yes       Intervention Provide education and explanation on how to use RPE scale       Expected Outcomes Short Term: Able to use RPE daily in rehab to express subjective intensity level;Long Term:  Able to use RPE to guide intensity level when exercising independently       Able to understand and use Dyspnea scale Yes       Intervention Provide education and explanation on how to use Dyspnea scale       Expected Outcomes Short Term: Able to use Dyspnea scale daily in rehab to express subjective sense of shortness of breath during exertion;Long Term: Able to use Dyspnea scale to guide intensity level when exercising independently       Knowledge and understanding of Target Heart Rate Range (THRR) Yes       Intervention Provide education and explanation of THRR including how the numbers were predicted and where they are  located for reference       Expected Outcomes Short Term: Able to state/look up THRR;Short Term: Able to use daily as guideline for intensity in rehab;Long Term: Able to use THRR to  govern intensity when exercising independently       Understanding of Exercise Prescription Yes       Intervention Provide education, explanation, and written materials on patient's individual exercise prescription       Expected Outcomes Short Term: Able to explain program exercise prescription;Long Term: Able to explain home exercise prescription to exercise independently              Exercise Goals Re-Evaluation :   Discharge Exercise Prescription (Final Exercise Prescription Changes):   Nutrition:  Target Goals: Understanding of nutrition guidelines, daily intake of sodium 1500mg , cholesterol 200mg , calories 30% from fat and 7% or less from saturated fats, daily to have 5 or more servings of fruits and vegetables.  Biometrics:  Pre Biometrics - 09/17/20 1059      Pre Biometrics   Grip Strength 32 kg            Nutrition Therapy Plan and Nutrition Goals:   Nutrition Assessments:  MEDIFICTS Score Key:  ?70 Need to make dietary changes   40-70 Heart Healthy Diet  ? 40 Therapeutic Level Cholesterol Diet   Picture Your Plate Scores:  <68 Unhealthy dietary pattern with much room for improvement.  41-50 Dietary pattern unlikely to meet recommendations for good health and room for improvement.  51-60 More healthful dietary pattern, with some room for improvement.   >60 Healthy dietary pattern, although there may be some specific behaviors that could be improved.    Nutrition Goals Re-Evaluation:   Nutrition Goals Discharge (Final Nutrition Goals Re-Evaluation):   Psychosocial: Target Goals: Acknowledge presence or absence of significant depression and/or stress, maximize coping skills, provide positive support system. Participant is able to verbalize types and ability to use  techniques and skills needed for reducing stress and depression.  Initial Review & Psychosocial Screening:  Initial Psych Review & Screening - 09/17/20 0946      Initial Review   Current issues with Current Stress Concerns    Source of Stress Concerns Chronic Illness;Family    Comments wife who moved in with her mother as the health declined this caused Hersey to be home alone to cook and clean the home.  Family dynamics with brother regarding care of the mother.   Wife's mother recently passed away      [place]? Yes      Barriers   Psychosocial barriers to participate in program The patient should benefit from training in stress management and relaxation.      Screening Interventions   Interventions Encouraged to exercise;Provide feedback about the scores to participant    Expected Outcomes Long Term goal: The participant improves quality of Life and PHQ9 Scores as seen by post scores and/or verbalization of changes;Short Term goal: Identification and review with participant of any Quality of Life or Depression concerns found by scoring the questionnaire.;Long Term Goal: Stressors or current issues are controlled or eliminated.;Short Term goal: Utilizing psychosocial counselor, staff and physician to assist with identification of specific Stressors or current issues interfering with healing process. Setting desired goal for each stressor or current issue identified.           Quality of Life Scores:  Scores of 19 and below usually indicate a poorer quality of life in these areas.  A difference of  2-3 points is a clinically meaningful difference.  A difference of 2-3 points in the total score of the Quality of Life Index has been associated with significant improvement in  overall quality of life, self-image, physical symptoms, and general health in studies assessing change in quality of life.  PHQ-9: Recent Review Flowsheet Data    Depression screen [facility] 2/9  09/17/2020 09/17/2020 05/18/2016 01/24/2016   Decreased Interest 0 0 0 0   Down, Depressed, Hopeless 0 0 0 0   PHQ - 2 Score 0 0 0 0   Altered sleeping 0 - - -   Tired, decreased energy 2 - - -   Change in appetite 2 - - -   Feeling bad or failure about yourself  0 - - -   Trouble concentrating 0 - - -   Moving slowly or fidgety/restless 0 - - -   Suicidal thoughts 0 - - -   PHQ-9 Score 4 - - -   Difficult doing work/chores Not difficult at all - - -     Interpretation of Total Score  Total Score Depression Severity:  1-4 = Minimal depression, 5-9 = Mild depression, 10-14 = Moderate depression, 15-19 = Moderately severe depression, 20-27 = Severe depression   Psychosocial Evaluation and Intervention:   Psychosocial Re-Evaluation:   Psychosocial Discharge (Final Psychosocial Re-Evaluation):   Education: Education Goals: Education classes will be provided on a weekly basis, covering required topics. Participant will state understanding/return demonstration of topics presented.  Learning Barriers/Preferences:  Learning Barriers/Preferences - 09/17/20 1350      Learning Barriers/Preferences   Learning Barriers Hearing;Sight           Education Topics: Risk Factor Reduction:  -Group instruction that is supported by a PowerPoint presentation. Instructor discusses the definition of a risk factor, different risk factors for pulmonary disease, and how the heart and lungs work together.     Nutrition for Pulmonary Patient:  -Group instruction provided by PowerPoint slides, verbal discussion, and written materials to support subject matter. The instructor gives an explanation and review of healthy diet recommendations, which includes a discussion on weight management, recommendations for fruit and vegetable consumption, as well as protein, fluid, caffeine, fiber, sodium, sugar, and alcohol. Tips for eating when patients are short of breath are discussed.   PULMONARY REHAB OTHER  RESPIRATORY from 05/11/2016 in [facility]  Date 03/30/16  Educator RD  Instruction Review Code (Retired) 2- meets goals/outcomes      Pursed Lip Breathing:  -Group instruction that is supported by demonstration and informational handouts. Instructor discusses the benefits of pursed lip and diaphragmatic breathing and detailed demonstration on how to preform both.     PULMONARY REHAB OTHER RESPIRATORY from 05/11/2016 in [place]  Date 02/10/16  Educator RT  Instruction Review Code (Retired) 2- meets goals/outcomes      Oxygen Safety:  -Group instruction provided by PowerPoint, verbal discussion, and written material to support subject matter. There is an overview of "What is Oxygen" and "Why do we need it".  Instructor also reviews how to create a safe environment for oxygen use, the importance of using oxygen as prescribed, and the risks of noncompliance. There is a brief discussion on traveling with oxygen and resources the patient may utilize.   PULMONARY REHAB OTHER RESPIRATORY from 05/11/2016 in [facility]  Date 02/03/16  Educator Trish Fountain  Instruction Review Code (Retired) 2- meets goals/outcomes      Oxygen Equipment:  -Group instruction provided by [employer], Network engineer, and Insurance underwriter.   PULMONARY REHAB OTHER RESPIRATORY from 05/11/2016 in [place]  Date 02/17/16  Educator Ace Gins rep  Instruction Review Code (Retired) 2- meets goals/outcomes      Signs and Symptoms:  -Group instruction provided by written material and verbal discussion to support subject matter. Warning signs and symptoms of infection, stroke, and heart attack are reviewed and when to call the physician/911 reinforced. Tips for preventing the spread of infection discussed.   Advanced Directives:  -Group instruction provided  by verbal instruction and written material to support subject matter. Instructor reviews Advanced Directive laws and proper instruction for filling out document.   PULMONARY REHAB OTHER RESPIRATORY from 05/11/2016 in [place]  Date 03/16/16  Educator Mikki Santee [place]  Instruction Review Code (Retired) 2- meets goals/outcomes      Pulmonary Video:  -Group video education that reviews the importance of medication and oxygen compliance, exercise, good nutrition, pulmonary hygiene, and pursed lip and diaphragmatic breathing for the pulmonary patient.   PULMONARY REHAB OTHER RESPIRATORY from 05/11/2016 in [place]  Date 04/06/16  Instruction Review Code (Retired) 2- meets goals/outcomes      Exercise for the Pulmonary Patient:  -Group instruction that is supported by a PowerPoint presentation. Instructor discusses benefits of exercise, core components of exercise, frequency, duration, and intensity of an exercise routine, importance of utilizing pulse oximetry during exercise, safety while exercising, and options of places to exercise outside of rehab.     PULMONARY REHAB OTHER RESPIRATORY from 05/11/2016 in [place]  Date 05/04/16  Educator ep  Instruction Review Code (Retired) 2- meets goals/outcomes      Pulmonary Medications:  -Verbally interactive group education provided by Art therapist with focus on inhaled medications and proper administration.   PULMONARY REHAB OTHER RESPIRATORY from 05/11/2016 in [place]  Date 02/10/16  Educator RT  Instruction Review Code (Retired) 2- Lawyer and Physiology of the Respiratory System and Intimacy:  -Group instruction provided by PowerPoint, verbal discussion, and written material to support subject matter. Instructor reviews respiratory cycle and anatomical components of the respiratory system  and their functions. Instructor also reviews differences in obstructive and restrictive respiratory diseases with examples of each. Intimacy, Sex, and Sexuality differences are reviewed with a discussion on how relationships can change when diagnosed with pulmonary disease. Common sexual concerns are reviewed.   PULMONARY REHAB OTHER RESPIRATORY from 05/11/2016 in [place]  Date 03/09/16  Educator Trish Fountain  Instruction Review Code (Retired) 2- meets goals/outcomes      MD DAY -A group question and answer session with a medical doctor that allows participants to ask questions that relate to their pulmonary disease state.   OTHER EDUCATION -Group or individual verbal, written, or video instructions that support the educational goals of the pulmonary rehab program.   Holiday Eating Survival Tips:  -Group instruction provided by PowerPoint slides, verbal discussion, and written materials to support subject matter. The instructor gives patients tips, tricks, and techniques to help them not only survive but enjoy the holidays despite the onslaught of food that accompanies the holidays.   Knowledge Questionnaire Score:  Knowledge Questionnaire Score - 09/17/20 1137      Knowledge Questionnaire Score   Pre Score 12/13           Core Components/Risk Factors/Patient Goals at Admission:  Personal Goals and Risk Factors at Admission - 09/17/20 0957  Core Components/Risk Factors/Patient Goals on Admission    Weight Management Weight Gain;Yes    Admit Weight 102 lb (46.3 kg)    Goal Weight: Short Term 115 lb (52.2 kg)    Goal Weight: Long Term 125 lb (56.7 kg)    Improve shortness of breath with ADL's Yes    Intervention Provide education, individualized exercise plan and daily activity instruction to help decrease symptoms of SOB with activities of daily living.    Expected Outcomes Short Term: Improve cardiorespiratory fitness to achieve a  reduction of symptoms when performing ADLs;Long Term: Be able to perform more ADLs without symptoms or delay the onset of symptoms    Hypertension Yes    Intervention Provide education on lifestyle modifcations including regular physical activity/exercise, weight management, moderate sodium restriction and increased consumption of fresh fruit, vegetables, and low fat dairy, alcohol moderation, and smoking cessation.;Monitor prescription use compliance.    Expected Outcomes Short Term: Continued assessment and intervention until BP is < 140/26mm HG in hypertensive participants. < 130/53mm HG in hypertensive participants with diabetes, heart failure or chronic kidney disease.;Long Term: Maintenance of blood pressure at goal levels.    Lipids Yes    Intervention Provide education and support for participant on nutrition & aerobic/resistive exercise along with prescribed medications to achieve LDL 70mg , HDL >40mg .    Stress Yes    Intervention Offer individual and/or small group education and counseling on adjustment to heart disease, stress management and health-related lifestyle change. Teach and support self-help strategies.    Expected Outcomes Short Term: Participant demonstrates changes in health-related behavior, relaxation and other stress management skills, ability to obtain effective social support, and compliance with psychotropic medications if prescribed.;Long Term: Emotional wellbeing is indicated by absence of clinically significant psychosocial distress or social isolation.           Core Components/Risk Factors/Patient Goals Review:    Core Components/Risk Factors/Patient Goals at Discharge (Final Review):    ITP Comments:   Comments:  Pt is eager to return to pulmonary rehab.  Pt will begin exercise on 11/23 pending review and cosinage of this initial ITP by the medical director, Dr. [name]. Cherre Huger, BSN Cardiac and Training and development officer

## 2020-09-20 NOTE — Progress Notes (Signed)
Pulmonary Individual Treatment Plan  Patient Details  Name: William Lee MRN: [id number] Date of Birth: 03-04-1946 Referring Provider:     Pulmonary Rehab Walk Test from 09/17/2020 in [place]  Referring Provider [name], MD      Initial Encounter Date:    Pulmonary Rehab Walk Test from 09/17/2020 in [place]  Date 09/17/20      Visit Diagnosis: Other emphysema ([place])  Patient's Home Medications on Admission:   Current Outpatient Medications:  .  albuterol (PROVENTIL HFA;VENTOLIN HFA) 108 (90 BASE) MCG/ACT inhaler, Inhale 2 puffs into the lungs every 6 (six) hours as needed for wheezing., Disp: , Rfl:  .  aspirin 81 MG tablet, Take 81 mg by mouth daily., Disp: , Rfl:  .  atorvastatin (LIPITOR) 40 MG tablet, Take 20 mg by mouth daily. , Disp: , Rfl:  .  budesonide-formoterol (SYMBICORT) 160-4.5 MCG/ACT inhaler, Inhale 2 puffs into the lungs 2 (two) times daily. Until completed on 11/24 then will switch to wixela, Disp: , Rfl:  .  cyclobenzaprine (FLEXERIL) 10 MG tablet, Take 10 mg by mouth 2 (two) times daily as needed for muscle spasms. , Disp: , Rfl:  .  diltiazem (CARDIZEM CD) 120 MG 24 hr capsule, Take 120 mg by mouth at bedtime., Disp: , Rfl:  .  fluticasone (FLONASE) 50 MCG/ACT nasal spray, Place 2 sprays into the nose daily as needed for rhinitis. , Disp: , Rfl:  .  [START ON 09/23/2020] Fluticasone-Salmeterol (WIXELA INHUB) 250-50 MCG/DOSE AEPB, Inhale 1 puff into the lungs 2 (two) times daily. Per the [facility] PCP - Duggal Patient will complete Symbicort until bottle empty then will switch to wixela on 09/23/20 (Patient not taking: Reported on 09/17/2020), Disp: , Rfl:  .  omeprazole (PRILOSEC) 20 MG capsule, Take 40 mg by mouth 2 (two) times daily. Take 2 capsules in the morning and 2 capsules in the evening, Disp: , Rfl:  .  OXYGEN, Inhale 2 L into the lungs as needed (During activity)., Disp: , Rfl:  .   potassium chloride 20 MEQ/15ML (10%) SOLN, Take 20 mEq by mouth 2 (two) times daily. , Disp: , Rfl:  .  SPIRIVA HANDIHALER 18 MCG inhalation capsule, Place 1 capsule into inhaler and inhale daily., Disp: , Rfl:  .  triamterene-hydrochlorothiazide (MAXZIDE-25) 37.5-25 MG per tablet, Take 1 tablet by mouth daily. Reported on 01/24/2016, Disp: , Rfl:  .  valsartan (DIOVAN) 40 MG tablet, Take 1 tablet (40 mg total) by mouth daily. Pt  Needs to make appt with provider by Oct for further refills, Disp: 90 tablet, Rfl: 1  Past Medical History: Past Medical History:  Diagnosis Date  . Asthma   . Chest pain   . Community acquired pneumonia 11/12/2011  . COPD (chronic obstructive pulmonary disease) ([place])   . COPD, severe   . Coronary artery disease   . Coronary artery disease involving native coronary artery of native heart without angina pectoris 05/16/2017  . Dyslipidemia   . GERD (gastroesophageal reflux disease)   . Hearing loss of both ears 09-11-13   bilateral hearing aids used  . Hyperlipidemia   . Hypertension   . Ischemic heart disease   . left Inguinal hernia 02/01/2012   Repaired 05/01/12   . MI (myocardial infarction) ([place])   . Mixed hyperlipidemia 05/16/2017  . Papilloma of larynx   . PONV (postoperative nausea and vomiting)   . Shortness of breath 11/11/2011  . SOB (  shortness of breath) on exertion     Tobacco Use: Social History   Tobacco Use  Smoking Status Former Smoker  . Quit date: 10/30/1990  . Years since quitting: 29.9  Smokeless Tobacco Never Used    Labs: Recent Review Flowsheet Data    Labs for ITP Cardiac and Pulmonary Rehab Latest Ref Rng & Units 07/22/2014 01/21/2015 08/11/2015 04/19/2016 08/29/2019   Cholestrol 100 - 199 mg/dL 161 159 165 - 164   LDLCALC 0 - 99 mg/dL 87 79 89 - 85   HDL >39 mg/dL 50.10 49.10 65 - 57   Trlycerides 0 - 149 mg/dL 119.0 153.0(H) 56 - 126   Hemoglobin A1c <5.7 % - - - - -   TCO2 0 - 100 mmol/L - - - 27 -      Capillary Blood  Glucose: Lab Results  Component Value Date   GLUCAP 116 (H) 02/07/2019   GLUCAP 146 (H) 11/12/2011     Pulmonary Assessment Scores:  Pulmonary Assessment Scores    Row Name 09/17/20 1127         ADL UCSD   SOB Score total 67  Pre       CAT Score   CAT Score Pre 26       mMRC Score   mMRC Score 4           UCSD: Self-administered rating of dyspnea associated with activities of daily living (ADLs) 6-point scale (0 = "not at all" to 5 = "maximal or unable to do because of breathlessness")  Scoring Scores range from 0 to 120.  Minimally important difference is 5 units  CAT: CAT can identify the health impairment of COPD patients and is better correlated with disease progression.  CAT has a scoring range of zero to 40. The CAT score is classified into four groups of low (less than 10), medium (10 - 20), high (21-30) and very high (31-40) based on the impact level of disease on health status. A CAT score over 10 suggests significant symptoms.  A worsening CAT score could be explained by an exacerbation, poor medication adherence, poor inhaler technique, or progression of COPD or comorbid conditions.  CAT MCID is 2 points  mMRC: mMRC (Modified Medical Research Council) Dyspnea Scale is used to assess the degree of baseline functional disability in patients of respiratory disease due to dyspnea. No minimal important difference is established. A decrease in score of 1 point or greater is considered a positive change.   Pulmonary Function Assessment:   Exercise Target Goals: Exercise Program Goal: Individual exercise prescription set using results from initial 6 min walk test and THRR while considering  patient's activity barriers and safety.   Exercise Prescription Goal: Initial exercise prescription builds to 30-45 minutes a day of aerobic activity, 2-3 days per week.  Home exercise guidelines will be given to patient during program as part of exercise prescription that the  participant will acknowledge.  Activity Barriers & Risk Stratification:  Activity Barriers & Cardiac Risk Stratification - 09/17/20 1350      Activity Barriers & Cardiac Risk Stratification   Comments tremors           6 Minute Walk:  6 Minute Walk    Row Name 09/17/20 1045         6 Minute Walk   Phase Initial     Distance 1000 feet     Walk Time 6 minutes     # of Rest Breaks 0  MPH 1.9     METS 3     RPE 14     Perceived Dyspnea  3     VO2 Peak 10.5     Symptoms Yes (comment)     Comments Bilateral calf pain 5/10. Left buttock pain 7/10, SOB RPD = 3     Resting HR 100 bpm     Resting BP 130/60     Resting Oxygen Saturation  100 %     Exercise Oxygen Saturation  during 6 min walk 97 %     Max Ex. HR 123 bpm     Max Ex. BP 172/74     2 Minute Post BP 150/70       Interval HR   1 Minute HR 118     2 Minute HR 120     3 Minute HR 120     4 Minute HR 120     5 Minute HR 122     6 Minute HR 123     2 Minute Post HR 110       Interval Oxygen   Baseline Oxygen Saturation % 100 %     1 Minute Oxygen Saturation % 99 %     1 Minute Liters of Oxygen 2 L     2 Minute Oxygen Saturation % 97 %     2 Minute Liters of Oxygen 2 L     3 Minute Oxygen Saturation % 97 %     3 Minute Liters of Oxygen 2 L     4 Minute Oxygen Saturation % 97 %     4 Minute Liters of Oxygen 2 L     5 Minute Oxygen Saturation % 97 %     5 Minute Liters of Oxygen 2 L     6 Minute Oxygen Saturation % 97 %     6 Minute Liters of Oxygen 2 L     2 Minute Post Oxygen Saturation % 99 %     2 Minute Post Liters of Oxygen 2 L            Oxygen Initial Assessment:  Oxygen Initial Assessment - 09/17/20 0942      Home Oxygen   Home Oxygen Device Home Concentrator;Portable Concentrator   [employer]   Sleep Oxygen Prescription Continuous    Liters per minute 2    Home Exercise Oxygen Prescription Continuous   uses pulse 2L   Liters per minute 2    Home Resting Oxygen Prescription None     Compliance with Home Oxygen Use Yes      Initial 6 min Walk   Oxygen Used Continuous    Liters per minute 2      Program Oxygen Prescription   Program Oxygen Prescription Continuous    Liters per minute 2      Intervention   Short Term Goals To learn and exhibit compliance with exercise, home and travel O2 prescription;To learn and understand importance of monitoring SPO2 with pulse oximeter and demonstrate accurate use of the pulse oximeter.;To learn and understand importance of maintaining oxygen saturations>88%;To learn and demonstrate proper pursed lip breathing techniques or other breathing techniques.;To learn and demonstrate proper use of respiratory medications    Long  Term Goals Exhibits compliance with exercise, home and travel O2 prescription;Verbalizes importance of monitoring SPO2 with pulse oximeter and return demonstration;Maintenance of O2 saturations>88%;Exhibits proper breathing techniques, such as pursed lip breathing or other method taught during program session;Compliance with respiratory medication;Demonstrates  proper use of MDI's           Oxygen Re-Evaluation:   Oxygen Discharge (Final Oxygen Re-Evaluation):   Initial Exercise Prescription:  Initial Exercise Prescription - 09/17/20 1300      Date of Initial Exercise RX and Referring Provider   Date 09/17/20    Referring Provider [name], MD    Expected Discharge Date 11/18/20      Oxygen   Oxygen Continuous    Liters 2      NuStep   Level 2    SPM 75    Minutes 25    METs 2      Prescription Details   Frequency (times per week) 2    Duration Progress to 30 minutes of continuous aerobic without signs/symptoms of physical distress      Intensity   THRR 40-80% of Max Heartrate 58-116    Ratings of Perceived Exertion 11-13    Perceived Dyspnea 0-4      Progression   Progression Continue progressive overload as per policy without signs/symptoms or physical distress.      Resistance  Training   Training Prescription Yes    Weight blue bands    Reps 10-15           Perform Capillary Blood Glucose checks as needed.  Exercise Prescription Changes:   Exercise Comments:   Exercise Goals and Review:  Exercise Goals    Row Name 09/17/20 0941             Exercise Goals   Increase Physical Activity Yes       Expected Outcomes Long Term: Exercising regularly at least 3-5 days a week.;Long Term: Add in home exercise to make exercise part of routine and to increase amount of physical activity.;Short Term: Attend rehab on a regular basis to increase amount of physical activity.       Increase Strength and Stamina Yes       Expected Outcomes Short Term: Increase workloads from initial exercise prescription for resistance, speed, and METs.;Short Term: Perform resistance training exercises routinely during rehab and add in resistance training at home;Long Term: Improve cardiorespiratory fitness, muscular endurance and strength as measured by increased METs and functional capacity (6MWT)       Able to understand and use rate of perceived exertion (RPE) scale Yes       Intervention Provide education and explanation on how to use RPE scale       Expected Outcomes Short Term: Able to use RPE daily in rehab to express subjective intensity level;Long Term:  Able to use RPE to guide intensity level when exercising independently       Able to understand and use Dyspnea scale Yes       Intervention Provide education and explanation on how to use Dyspnea scale       Expected Outcomes Short Term: Able to use Dyspnea scale daily in rehab to express subjective sense of shortness of breath during exertion;Long Term: Able to use Dyspnea scale to guide intensity level when exercising independently       Knowledge and understanding of Target Heart Rate Range (THRR) Yes       Intervention Provide education and explanation of THRR including how the numbers were predicted and where they are  located for reference       Expected Outcomes Short Term: Able to state/look up THRR;Short Term: Able to use daily as guideline for intensity in rehab;Long Term: Able to use THRR to  govern intensity when exercising independently       Understanding of Exercise Prescription Yes       Intervention Provide education, explanation, and written materials on patient's individual exercise prescription       Expected Outcomes Short Term: Able to explain program exercise prescription;Long Term: Able to explain home exercise prescription to exercise independently              Exercise Goals Re-Evaluation :   Discharge Exercise Prescription (Final Exercise Prescription Changes):   Nutrition:  Target Goals: Understanding of nutrition guidelines, daily intake of sodium 1500mg , cholesterol 200mg , calories 30% from fat and 7% or less from saturated fats, daily to have 5 or more servings of fruits and vegetables.  Biometrics:  Pre Biometrics - 09/17/20 1059      Pre Biometrics   Grip Strength 32 kg            Nutrition Therapy Plan and Nutrition Goals:   Nutrition Assessments:  MEDIFICTS Score Key:  ?70 Need to make dietary changes   40-70 Heart Healthy Diet  ? 40 Therapeutic Level Cholesterol Diet   Picture Your Plate Scores:  <03 Unhealthy dietary pattern with much room for improvement.  41-50 Dietary pattern unlikely to meet recommendations for good health and room for improvement.  51-60 More healthful dietary pattern, with some room for improvement.   >60 Healthy dietary pattern, although there may be some specific behaviors that could be improved.    Nutrition Goals Re-Evaluation:   Nutrition Goals Discharge (Final Nutrition Goals Re-Evaluation):   Psychosocial: Target Goals: Acknowledge presence or absence of significant depression and/or stress, maximize coping skills, provide positive support system. Participant is able to verbalize types and ability to use  techniques and skills needed for reducing stress and depression.  Initial Review & Psychosocial Screening:  Initial Psych Review & Screening - 09/17/20 0946      Initial Review   Current issues with Current Stress Concerns    Source of Stress Concerns Chronic Illness;Family    Comments wife who moved in with her mother as the health declined this caused Coreon to be home alone to cook and clean the home.  Family dynamics with brother regarding care of the mother.   Wife's mother recently passed away      [place]? Yes      Barriers   Psychosocial barriers to participate in program The patient should benefit from training in stress management and relaxation.      Screening Interventions   Interventions Encouraged to exercise;Provide feedback about the scores to participant    Expected Outcomes Long Term goal: The participant improves quality of Life and PHQ9 Scores as seen by post scores and/or verbalization of changes;Short Term goal: Identification and review with participant of any Quality of Life or Depression concerns found by scoring the questionnaire.;Long Term Goal: Stressors or current issues are controlled or eliminated.;Short Term goal: Utilizing psychosocial counselor, staff and physician to assist with identification of specific Stressors or current issues interfering with healing process. Setting desired goal for each stressor or current issue identified.           Quality of Life Scores:  Scores of 19 and below usually indicate a poorer quality of life in these areas.  A difference of  2-3 points is a clinically meaningful difference.  A difference of 2-3 points in the total score of the Quality of Life Index has been associated with significant improvement in  overall quality of life, self-image, physical symptoms, and general health in studies assessing change in quality of life.  PHQ-9: Recent Review Flowsheet Data    Depression screen [facility] 2/9  09/17/2020 09/17/2020 05/18/2016 01/24/2016   Decreased Interest 0 0 0 0   Down, Depressed, Hopeless 0 0 0 0   PHQ - 2 Score 0 0 0 0   Altered sleeping 0 - - -   Tired, decreased energy 2 - - -   Change in appetite 2 - - -   Feeling bad or failure about yourself  0 - - -   Trouble concentrating 0 - - -   Moving slowly or fidgety/restless 0 - - -   Suicidal thoughts 0 - - -   PHQ-9 Score 4 - - -   Difficult doing work/chores Not difficult at all - - -     Interpretation of Total Score  Total Score Depression Severity:  1-4 = Minimal depression, 5-9 = Mild depression, 10-14 = Moderate depression, 15-19 = Moderately severe depression, 20-27 = Severe depression   Psychosocial Evaluation and Intervention:   Psychosocial Re-Evaluation:  Psychosocial Re-Evaluation    [place] Name 09/20/20 1149             Psychosocial Re-Evaluation   Current issues with Current Stress Concerns       Comments No change since orientation/walk test which was 3 days ago.       Expected Outcomes For [place] to handle stress in healthy ways.       Interventions Stress management education;Encouraged to attend Pulmonary Rehabilitation for the exercise       Continue Psychosocial Services  No Follow up required         Initial Review   Source of Stress Concerns Chronic Illness;Unable to participate in former interests or hobbies;Unable to perform yard/household activities              Psychosocial Discharge (Final Psychosocial Re-Evaluation):  Psychosocial Re-Evaluation - 09/20/20 1149      Psychosocial Re-Evaluation   Current issues with Current Stress Concerns    Comments No change since orientation/walk test which was 3 days ago.    Expected Outcomes For [place] to handle stress in healthy ways.    Interventions Stress management education;Encouraged to attend Pulmonary Rehabilitation for the exercise    Continue Psychosocial Services  No Follow up required      Initial Review   Source of Stress  Concerns Chronic Illness;Unable to participate in former interests or hobbies;Unable to perform yard/household activities           Education: Education Goals: Education classes will be provided on a weekly basis, covering required topics. Participant will state understanding/return demonstration of topics presented.  Learning Barriers/Preferences:  Learning Barriers/Preferences - 09/17/20 1350      Learning Barriers/Preferences   Learning Barriers Hearing;Sight           Education Topics: Risk Factor Reduction:  -Group instruction that is supported by a PowerPoint presentation. Instructor discusses the definition of a risk factor, different risk factors for pulmonary disease, and how the heart and lungs work together.     Nutrition for Pulmonary Patient:  -Group instruction provided by PowerPoint slides, verbal discussion, and written materials to support subject matter. The instructor gives an explanation and review of healthy diet recommendations, which includes a discussion on weight management, recommendations for fruit and vegetable consumption, as well as protein, fluid, caffeine, fiber, sodium, sugar, and alcohol. Tips for eating when  patients are short of breath are discussed.   PULMONARY REHAB OTHER RESPIRATORY from 05/11/2016 in [place]  Date 03/30/16  Educator RD  Instruction Review Code (Retired) 2- meets goals/outcomes      Pursed Lip Breathing:  -Group instruction that is supported by demonstration and informational handouts. Instructor discusses the benefits of pursed lip and diaphragmatic breathing and detailed demonstration on how to preform both.     PULMONARY REHAB OTHER RESPIRATORY from 05/11/2016 in [place]  Date 02/10/16  Educator RT  Instruction Review Code (Retired) 2- meets goals/outcomes      Oxygen Safety:  -Group instruction provided by PowerPoint, verbal discussion, and written  material to support subject matter. There is an overview of "What is Oxygen" and "Why do we need it".  Instructor also reviews how to create a safe environment for oxygen use, the importance of using oxygen as prescribed, and the risks of noncompliance. There is a brief discussion on traveling with oxygen and resources the patient may utilize.   PULMONARY REHAB OTHER RESPIRATORY from 05/11/2016 in [place]  Date 02/03/16  Educator Trish Fountain  Instruction Review Code (Retired) 2- meets goals/outcomes      Oxygen Equipment:  -Group instruction provided by [employer], Network engineer, and Insurance underwriter.   PULMONARY REHAB OTHER RESPIRATORY from 05/11/2016 in [place]  Date 02/17/16  Educator Ace Gins rep  Instruction Review Code (Retired) 2- meets goals/outcomes      Signs and Symptoms:  -Group instruction provided by written material and verbal discussion to support subject matter. Warning signs and symptoms of infection, stroke, and heart attack are reviewed and when to call the physician/911 reinforced. Tips for preventing the spread of infection discussed.   Advanced Directives:  -Group instruction provided by verbal instruction and written material to support subject matter. Instructor reviews Advanced Directive laws and proper instruction for filling out document.   PULMONARY REHAB OTHER RESPIRATORY from 05/11/2016 in [place]  Date 03/16/16  Educator Mikki [place]  Instruction Review Code (Retired) 2- meets goals/outcomes      Pulmonary Video:  -Group video education that reviews the importance of medication and oxygen compliance, exercise, good nutrition, pulmonary hygiene, and pursed lip and diaphragmatic breathing for the pulmonary patient.   PULMONARY REHAB OTHER RESPIRATORY from 05/11/2016 in [place]  Date  04/06/16  Instruction Review Code (Retired) 2- meets goals/outcomes      Exercise for the Pulmonary Patient:  -Group instruction that is supported by a PowerPoint presentation. Instructor discusses benefits of exercise, core components of exercise, frequency, duration, and intensity of an exercise routine, importance of utilizing pulse oximetry during exercise, safety while exercising, and options of places to exercise outside of rehab.     PULMONARY REHAB OTHER RESPIRATORY from 05/11/2016 in [place]  Date 05/04/16  Educator ep  Instruction Review Code (Retired) 2- meets goals/outcomes      Pulmonary Medications:  -Verbally interactive group education provided by Art therapist with focus on inhaled medications and proper administration.   PULMONARY REHAB OTHER RESPIRATORY from 05/11/2016 in [place]  Date 02/10/16  Educator RT  Instruction Review Code (Retired) 2- Lawyer and Physiology of the Respiratory System and Intimacy:  -Group instruction provided by PowerPoint, verbal discussion, and written material to support  subject matter. Instructor reviews respiratory cycle and anatomical components of the respiratory system and their functions. Instructor also reviews differences in obstructive and restrictive respiratory diseases with examples of each. Intimacy, Sex, and Sexuality differences are reviewed with a discussion on how relationships can change when diagnosed with pulmonary disease. Common sexual concerns are reviewed.   PULMONARY REHAB OTHER RESPIRATORY from 05/11/2016 in [place]  Date 03/09/16  Educator Trish Fountain  Instruction Review Code (Retired) 2- meets goals/outcomes      MD DAY -A group question and answer session with a medical doctor that allows participants to ask questions that relate to their pulmonary disease state.   OTHER  EDUCATION -Group or individual verbal, written, or video instructions that support the educational goals of the pulmonary rehab program.   Holiday Eating Survival Tips:  -Group instruction provided by PowerPoint slides, verbal discussion, and written materials to support subject matter. The instructor gives patients tips, tricks, and techniques to help them not only survive but enjoy the holidays despite the onslaught of food that accompanies the holidays.   Knowledge Questionnaire Score:  Knowledge Questionnaire Score - 09/17/20 1137      Knowledge Questionnaire Score   Pre Score 12/13           Core Components/Risk Factors/Patient Goals at Admission:  Personal Goals and Risk Factors at Admission - 09/17/20 0957      Core Components/Risk Factors/Patient Goals on Admission    Weight Management Weight Gain;Yes    Admit Weight 102 lb (46.3 kg)    Goal Weight: Short Term 115 lb (52.2 kg)    Goal Weight: Long Term 125 lb (56.7 kg)    Improve shortness of breath with ADL's Yes    Intervention Provide education, individualized exercise plan and daily activity instruction to help decrease symptoms of SOB with activities of daily living.    Expected Outcomes Short Term: Improve cardiorespiratory fitness to achieve a reduction of symptoms when performing ADLs;Long Term: Be able to perform more ADLs without symptoms or delay the onset of symptoms    Hypertension Yes    Intervention Provide education on lifestyle modifcations including regular physical activity/exercise, weight management, moderate sodium restriction and increased consumption of fresh fruit, vegetables, and low fat dairy, alcohol moderation, and smoking cessation.;Monitor prescription use compliance.    Expected Outcomes Short Term: Continued assessment and intervention until BP is < 140/33mm HG in hypertensive participants. < 130/37mm HG in hypertensive participants with diabetes, heart failure or chronic kidney disease.;Long  Term: Maintenance of blood pressure at goal levels.    Lipids Yes    Intervention Provide education and support for participant on nutrition & aerobic/resistive exercise along with prescribed medications to achieve LDL 70mg , HDL >40mg .    Stress Yes    Intervention Offer individual and/or small group education and counseling on adjustment to heart disease, stress management and health-related lifestyle change. Teach and support self-help strategies.    Expected Outcomes Short Term: Participant demonstrates changes in health-related behavior, relaxation and other stress management skills, ability to obtain effective social support, and compliance with psychotropic medications if prescribed.;Long Term: Emotional wellbeing is indicated by absence of clinically significant psychosocial distress or social isolation.           Core Components/Risk Factors/Patient Goals Review:   Goals and Risk Factor Review    Row Name 09/20/20 1151             Core Components/Risk Factors/Patient Goals Review   Personal  Goals Review Develop more efficient breathing techniques such as purse lipped breathing and diaphragmatic breathing and practicing self-pacing with activity.;Increase knowledge of respiratory medications and ability to use respiratory devices properly.;Improve shortness of breath with ADL's       Review Has not started exercising in pulmonary rehab yet , but will in the next day.       Expected Outcomes For patient to make progression towards goals in the next 30 days.              Core Components/Risk Factors/Patient Goals at Discharge (Final Review):   Goals and Risk Factor Review - 09/20/20 1151      Core Components/Risk Factors/Patient Goals Review   Personal Goals Review Develop more efficient breathing techniques such as purse lipped breathing and diaphragmatic breathing and practicing self-pacing with activity.;Increase knowledge of respiratory medications and ability to use  respiratory devices properly.;Improve shortness of breath with ADL's    Review Has not started exercising in pulmonary rehab yet , but will in the next day.    Expected Outcomes For patient to make progression towards goals in the next 30 days.           ITP Comments:   Comments: ITP REVIEW Pt is making expected progress toward pulmonary rehab goals after completing 0 sessions. Recommend continued exercise, life style modification, education, and utilization of breathing techniques to increase stamina and strength and decrease shortness of breath with exertion.

## 2020-09-21 ENCOUNTER — Other Ambulatory Visit: Payer: Self-pay

## 2020-09-21 ENCOUNTER — Encounter (HOSPITAL_COMMUNITY)
Admission: RE | Admit: 2020-09-21 | Discharge: 2020-09-21 | Disposition: A | Discharge status: Home / Self Care | Payer: Medicare Other | Source: Ambulatory Visit | Attending: Cardiology | Admitting: Cardiology

## 2020-09-21 DIAGNOSIS — J438 Other emphysema: Secondary | ICD-10-CM

## 2020-09-21 NOTE — Progress Notes (Signed)
Daily Session Note  Patient Details  Name: William Lee MRN: [id number] Date of Birth: [birth year] Referring Provider:     Pulmonary Rehab Walk Test from 09/17/2020 in [place]  Referring Provider [name], MD      Encounter Date: 09/21/2020  Check In:  Session Check In - 09/21/20 1112      Check-In   Supervising physician immediately available to respond to emergencies [facility] immediately available    Physician(s) Dr. Wynelle Cleveland    Location [facility]    Staff Present Rosebud Poles, RN, BSN;Lisa Ysidro Evert, RN;Katharina Jehle Hassell Done, MS, ACSM-CEP, Exercise Physiologist    Virtual Visit No    Medication changes reported     No    Fall or balance concerns reported    No    Tobacco Cessation No Change    Warm-up and Cool-down Performed as group-led instruction    Resistance Training Performed Yes    VAD Patient? No    PAD/SET Patient? No      Pain Assessment   Currently in Pain? No/denies    Multiple Pain Sites No           Capillary Blood Glucose: No results found for this or any previous visit (from the past 24 hour(s)).    Social History   Tobacco Use  Smoking Status Former Smoker  . Quit date: 10/30/1990  . Years since quitting: 29.9  Smokeless Tobacco Never Used    Goals Met:  Proper associated with RPD/PD & O2 Sat Exercise tolerated well No report of cardiac concerns or symptoms Strength training completed today  Goals Unmet:  Not Applicable  Comments: Service time is from 1048 to 1145 Pt completed first day of exercise and tolerated well with no complaints or concerns.    Dr. [name] is Medical Director for Cardiac Rehab at [facility].

## 2020-09-28 ENCOUNTER — Other Ambulatory Visit: Payer: Self-pay

## 2020-09-28 ENCOUNTER — Encounter (HOSPITAL_COMMUNITY)
Admission: RE | Admit: 2020-09-28 | Discharge: 2020-09-28 | Disposition: A | Discharge status: Home / Self Care | Payer: Medicare Other | Source: Ambulatory Visit | Attending: Cardiology | Admitting: Cardiology

## 2020-09-28 VITALS — Wt 100.3 lb

## 2020-09-28 DIAGNOSIS — J438 Other emphysema: Secondary | ICD-10-CM

## 2020-09-28 NOTE — Progress Notes (Signed)
Daily Session Note  Patient Details  Name: William Lee MRN: [id number] Date of Birth: [birth year] Referring Provider:     Pulmonary Rehab Walk Test from 09/17/2020 in [place]  Referring Provider [name], MD      Encounter Date: 09/28/2020  Check In:  Session Check In - 09/28/20 1055      Check-In   Supervising physician immediately available to respond to emergencies [facility] immediately available    Physician(s) Dr. Tyrell Antonio    [facility]    Staff Present Rosebud Poles, RN, BSN;Amariyah Bazar Ysidro Evert, RN;Jessica Hassell Done, MS, ACSM-CEP, Exercise Physiologist    Virtual Visit No    Medication changes reported     No    Fall or balance concerns reported    No    Tobacco Cessation No Change    Warm-up and Cool-down Performed as group-led instruction    Resistance Training Performed Yes    VAD Patient? No    PAD/SET Patient? No      Pain Assessment   Currently in Pain? No/denies    Multiple Pain Sites No           Capillary Blood Glucose: No results found for this or any previous visit (from the past 24 hour(s)).   Exercise Prescription Changes - 09/28/20 1100      Response to Exercise   Blood Pressure (Admit) 104/50    Blood Pressure (Exercise) 144/54    Blood Pressure (Exit) 96/54    Heart Rate (Admit) 95 bpm    Heart Rate (Exercise) 95 bpm    Heart Rate (Exit) 91 bpm    Oxygen Saturation (Admit) 99 %    Oxygen Saturation (Exercise) 100 %    Oxygen Saturation (Exit) 100 %    Rating of Perceived Exertion (Exercise) 11    Perceived Dyspnea (Exercise) 1    Duration Progress to 45 minutes of aerobic exercise without signs/symptoms of physical distress    Intensity Other (comment)   4-80% of HRR     Progression   Progression Continue to progress workloads to maintain intensity without signs/symptoms of physical distress.      Resistance Training   Training Prescription Yes    Weight orange bands     Reps 10-15    Time 10 Minutes      Oxygen   Oxygen Continuous    Liters 2      NuStep   Level 2    SPM 80    Minutes 30    METs 1.8           Social History   Tobacco Use  Smoking Status Former Smoker  . Quit date: 10/30/1990  . Years since quitting: 29.9  Smokeless Tobacco Never Used    Goals Met:  Exercise tolerated well No report of cardiac concerns or symptoms Strength training completed today  Goals Unmet:  Not Applicable  Comments: Service time is from 1045 to 1150    Dr. [name] is Medical Director for Cardiac Rehab at [facility].

## 2020-09-28 NOTE — Progress Notes (Signed)
William Lee 74 y.o. male Nutrition Note  Visit Diagnosis: Other emphysema [facility])  Past Medical History:  Diagnosis Date  . Asthma   . Chest pain   . Community acquired pneumonia 11/12/2011  . COPD (chronic obstructive pulmonary disease) ([place])   . COPD, severe   . Coronary artery disease   . Coronary artery disease involving native coronary artery of native heart without angina pectoris 05/16/2017  . Dyslipidemia   . GERD (gastroesophageal reflux disease)   . Hearing loss of both ears 09-11-13   bilateral hearing aids used  . Hyperlipidemia   . Hypertension   . Ischemic heart disease   . left Inguinal hernia 02/01/2012   Repaired 05/01/12   . MI (myocardial infarction) ([place])   . Mixed hyperlipidemia 05/16/2017  . Papilloma of larynx   . PONV (postoperative nausea and vomiting)   . Shortness of breath 11/11/2011  . SOB (shortness of breath) on exertion      Medications reviewed.   Current Outpatient Medications:  .  albuterol (PROVENTIL HFA;VENTOLIN HFA) 108 (90 BASE) MCG/ACT inhaler, Inhale 2 puffs into the lungs every 6 (six) hours as needed for wheezing., Disp: , Rfl:  .  aspirin 81 MG tablet, Take 81 mg by mouth daily., Disp: , Rfl:  .  atorvastatin (LIPITOR) 40 MG tablet, Take 20 mg by mouth daily. , Disp: , Rfl:  .  budesonide-formoterol (SYMBICORT) 160-4.5 MCG/ACT inhaler, Inhale 2 puffs into the lungs 2 (two) times daily. Until completed on 11/24 then will switch to wixela, Disp: , Rfl:  .  cyclobenzaprine (FLEXERIL) 10 MG tablet, Take 10 mg by mouth 2 (two) times daily as needed for muscle spasms. , Disp: , Rfl:  .  diltiazem (CARDIZEM CD) 120 MG 24 hr capsule, Take 120 mg by mouth at bedtime., Disp: , Rfl:  .  fluticasone (FLONASE) 50 MCG/ACT nasal spray, Place 2 sprays into the nose daily as needed for rhinitis. , Disp: , Rfl:  .  Fluticasone-Salmeterol (WIXELA INHUB) 250-50 MCG/DOSE AEPB, Inhale 1 puff into the lungs 2 (two) times daily. Per the [facility] [place] PCP - [place] Patient  will complete Symbicort until bottle empty then will switch to wixela on 09/23/20 (Patient not taking: Reported on 09/17/2020), Disp: , Rfl:  .  omeprazole (PRILOSEC) 20 MG capsule, Take 40 mg by mouth 2 (two) times daily. Take 2 capsules in the morning and 2 capsules in the evening, Disp: , Rfl:  .  OXYGEN, Inhale 2 L into the lungs as needed (During activity)., Disp: , Rfl:  .  potassium chloride 20 MEQ/15ML (10%) SOLN, Take 20 mEq by mouth 2 (two) times daily. , Disp: , Rfl:  .  SPIRIVA HANDIHALER 18 MCG inhalation capsule, Place 1 capsule into inhaler and inhale daily., Disp: , Rfl:  .  triamterene-hydrochlorothiazide (MAXZIDE-25) 37.5-25 MG per tablet, Take 1 tablet by mouth daily. Reported on 01/24/2016, Disp: , Rfl:  .  valsartan (DIOVAN) 40 MG tablet, Take 1 tablet (40 mg total) by mouth daily. Pt  Needs to make appt with provider by Oct for further refills, Disp: 90 tablet, Rfl: 1   Ht Readings from Last 1 Encounters:  09/17/20 5' 1.5" (1.562 m)     Wt Readings from Last 3 Encounters:  09/28/20 100 lb 5 oz (45.5 kg)  09/17/20 103 lb 1.6 oz (46.8 kg)  09/02/20 101 lb 6.4 oz (46 kg)     Body mass index is 18.65 kg/m.   Social History   Tobacco Use  Smoking Status Former Smoker  . Quit date: 10/30/1990  . Years since quitting: 29.9  Smokeless Tobacco Never Used      Nutrition Note  Spoke with pt. Nutrition Plan and Nutrition Survey goals reviewed with pt.   Appetite: poor Unintentional weight loss: 6.6% weight loss in the past 3 months on home scale.  Difficulty eating: He has dentures. He has dry mouth so his dentures do not stay in well. He typically eats without his dentures and avoids crunchy/tough foods. He uses dentures sometimes. Avoids salads always due to difficulty eating them with his dentures.  Diet recall: B- 1/2 egg and cheese biscuit (ate the other half for snack later) L- 4 boneless wings Snacks: 1 ensure complete (350 kcals, 30 g protein), 1 premier  protein shake (160 kcals, 30 g protein) Estimated protein intake: 89 g  Fluids: 24 oz water, 16-32 oz diet coke, 1-2 protein shakes  Meals per day: 2, 1-2 snacks    Pt expressed understanding of the information reviewed.    Nutrition Diagnosi ? Unintentional wt loss related to early satiety and poor appetite as evidenced by wt loss of 6% in 3 months  Nutrition Intervention ? Pt's individual nutrition plan reviewed with pt. ? Small, frequent, mini-meals andsnacks to help compensate for shortness of breath and possible limited oxygen supply to gastrointestinal tract to reach estimated energy needs of 1950 kcals for weight gain  ? Pt to drink 2 Ensure Complete per day to provide 700 kcals and 60 g protein  ? Continue client-centered nutrition education by RD, as part of interdisciplinary care.  Goal(s) ? Pt to identify food quantities necessary to achieve weight gain of 0.25 lbs per week while in cardiac rehab ? Eat mini meals 4-5 times per day ? Choose nutrient dense foods that are high in calories  Plan:   Will provide client-centered nutrition education as part of interdisciplinary care  Monitor and evaluate progress toward nutrition goal with team.   Michaele Offer, MS, RDN, LDN

## 2020-09-30 ENCOUNTER — Other Ambulatory Visit: Payer: Self-pay

## 2020-09-30 ENCOUNTER — Encounter (HOSPITAL_COMMUNITY)
Admission: RE | Admit: 2020-09-30 | Discharge: 2020-09-30 | Disposition: A | Discharge status: Home / Self Care | Payer: Medicare Other | Source: Ambulatory Visit | Attending: Cardiology | Admitting: Cardiology

## 2020-09-30 DIAGNOSIS — J438 Other emphysema: Secondary | ICD-10-CM | POA: Diagnosis not present

## 2020-09-30 NOTE — Progress Notes (Signed)
Daily Session Note  Patient Details  Name: William Lee MRN: [id number] Date of Birth: [birth year] Referring Provider:     Pulmonary Rehab Walk Test from 09/17/2020 in [place]  Referring Provider [name], MD      Encounter Date: 09/30/2020  Check In:  Session Check In - 09/30/20 1105      Check-In   Supervising physician immediately available to respond to emergencies [facility] immediately available    Physician(s) Dr. Roderic Palau    [facility]    Staff Present Rosebud Poles, RN, BSN;Devaunte Gasparini Ysidro Evert, RN;Jessica Hassell Done, MS, ACSM-CEP, Exercise Physiologist    Virtual Visit No    Medication changes reported     No    Fall or balance concerns reported    No    Tobacco Cessation No Change    Warm-up and Cool-down Performed on first and last piece of equipment    Resistance Training Performed Yes    VAD Patient? No    PAD/SET Patient? No      Pain Assessment   Currently in Pain? No/denies    Multiple Pain Sites No           Capillary Blood Glucose: No results found for this or any previous visit (from the past 24 hour(s)).    Social History   Tobacco Use  Smoking Status Former Smoker  . Quit date: 10/30/1990  . Years since quitting: 29.9  Smokeless Tobacco Never Used    Goals Met:  Exercise tolerated well No report of cardiac concerns or symptoms Strength training completed today  Goals Unmet:  Not Applicable  Comments: Service time is from 1040 to 1142    Dr. [name] is Medical Director for Cardiac Rehab at [facility].

## 2020-10-05 ENCOUNTER — Encounter (HOSPITAL_COMMUNITY)
Admission: RE | Admit: 2020-10-05 | Discharge: 2020-10-05 | Disposition: A | Discharge status: Home / Self Care | Payer: Medicare Other | Source: Ambulatory Visit | Attending: Cardiology | Admitting: Cardiology

## 2020-10-05 ENCOUNTER — Other Ambulatory Visit: Payer: Self-pay

## 2020-10-05 DIAGNOSIS — J438 Other emphysema: Secondary | ICD-10-CM | POA: Diagnosis not present

## 2020-10-05 NOTE — Progress Notes (Signed)
Daily Session Note  Patient Details  Name: William Lee MRN: [id number] Date of Birth: April 09, 1946 Referring Provider:     Pulmonary Rehab Walk Test from 09/17/2020 in [place]  Referring Provider [name], MD      Encounter Date: 10/05/2020  Check In:  Session Check In - 10/05/20 1120      Check-In   Supervising physician immediately available to respond to emergencies [facility] immediately available    Physician(s) Dr. Cyndia Skeeters    [facility]    Staff Present Rosebud Poles, RN, Isaac Laud, MS, ACSM-CEP, Exercise Physiologist;Kaicen Desena Ysidro Evert, RN    Virtual Visit No    Medication changes reported     No    Fall or balance concerns reported    No    Tobacco Cessation No Change    Warm-up and Cool-down Performed on first and last piece of equipment    Resistance Training Performed Yes    VAD Patient? No    PAD/SET Patient? No      Pain Assessment   Currently in Pain? No/denies    Multiple Pain Sites No           Capillary Blood Glucose: No results found for this or any previous visit (from the past 24 hour(s)).    Social History   Tobacco Use  Smoking Status Former Smoker  . Quit date: 10/30/1990  . Years since quitting: 29.9  Smokeless Tobacco Never Used    Goals Met:  Exercise tolerated well No report of cardiac concerns or symptoms Strength training completed today  Goals Unmet:  Not Applicable  Comments: Service time is from 1045 to 1145    Dr. [name] is Medical Director for Cardiac Rehab at [facility].

## 2020-10-07 ENCOUNTER — Encounter (HOSPITAL_COMMUNITY): Payer: Medicare Other

## 2020-10-12 ENCOUNTER — Other Ambulatory Visit: Payer: Self-pay

## 2020-10-12 ENCOUNTER — Encounter (HOSPITAL_COMMUNITY)
Admission: RE | Admit: 2020-10-12 | Discharge: 2020-10-12 | Disposition: A | Discharge status: Home / Self Care | Payer: Medicare Other | Source: Ambulatory Visit | Attending: Cardiology | Admitting: Cardiology

## 2020-10-12 VITALS — Wt 104.7 lb

## 2020-10-12 DIAGNOSIS — J438 Other emphysema: Secondary | ICD-10-CM

## 2020-10-12 NOTE — Progress Notes (Signed)
Pulmonary Individual Treatment Plan  Patient Details  Name: William Lee MRN: [id number] Date of Birth: Sep 15, 1946 Referring Provider:   April Manson Pulmonary Rehab Walk Test from 09/17/2020 in [place]  Referring Provider [name], MD      Initial Encounter Date:  Flowsheet Row Pulmonary Rehab Walk Test from 09/17/2020 in [place]  Date 09/17/20      Visit Diagnosis: Other emphysema ([place])  Patient's Home Medications on Admission:   Current Outpatient Medications:  .  albuterol (PROVENTIL HFA;VENTOLIN HFA) 108 (90 BASE) MCG/ACT inhaler, Inhale 2 puffs into the lungs every 6 (six) hours as needed for wheezing., Disp: , Rfl:  .  aspirin 81 MG tablet, Take 81 mg by mouth daily., Disp: , Rfl:  .  atorvastatin (LIPITOR) 40 MG tablet, Take 20 mg by mouth daily. , Disp: , Rfl:  .  budesonide-formoterol (SYMBICORT) 160-4.5 MCG/ACT inhaler, Inhale 2 puffs into the lungs 2 (two) times daily. Until completed on 11/24 then will switch to wixela, Disp: , Rfl:  .  cyclobenzaprine (FLEXERIL) 10 MG tablet, Take 10 mg by mouth 2 (two) times daily as needed for muscle spasms. , Disp: , Rfl:  .  diltiazem (CARDIZEM CD) 120 MG 24 hr capsule, Take 120 mg by mouth at bedtime., Disp: , Rfl:  .  fluticasone (FLONASE) 50 MCG/ACT nasal spray, Place 2 sprays into the nose daily as needed for rhinitis. , Disp: , Rfl:  .  Fluticasone-Salmeterol (WIXELA INHUB) 250-50 MCG/DOSE AEPB, Inhale 1 puff into the lungs 2 (two) times daily. Per the [facility] PCP - Duggal Patient will complete Symbicort until bottle empty then will switch to wixela on 09/23/20 (Patient not taking: Reported on 09/17/2020), Disp: , Rfl:  .  omeprazole (PRILOSEC) 20 MG capsule, Take 40 mg by mouth 2 (two) times daily. Take 2 capsules in the morning and 2 capsules in the evening, Disp: , Rfl:  .  OXYGEN, Inhale 2 L into the lungs as needed (During activity)., Disp: , Rfl:  .   potassium chloride 20 MEQ/15ML (10%) SOLN, Take 20 mEq by mouth 2 (two) times daily. , Disp: , Rfl:  .  SPIRIVA HANDIHALER 18 MCG inhalation capsule, Place 1 capsule into inhaler and inhale daily., Disp: , Rfl:  .  triamterene-hydrochlorothiazide (MAXZIDE-25) 37.5-25 MG per tablet, Take 1 tablet by mouth daily. Reported on 01/24/2016, Disp: , Rfl:  .  valsartan (DIOVAN) 40 MG tablet, Take 1 tablet (40 mg total) by mouth daily. Pt  Needs to make appt with provider by Oct for further refills, Disp: 90 tablet, Rfl: 1  Past Medical History: Past Medical History:  Diagnosis Date  . Asthma   . Chest pain   . Community acquired pneumonia 11/12/2011  . COPD (chronic obstructive pulmonary disease) ([place])   . COPD, severe   . Coronary artery disease   . Coronary artery disease involving native coronary artery of native heart without angina pectoris 05/16/2017  . Dyslipidemia   . GERD (gastroesophageal reflux disease)   . Hearing loss of both ears 09-11-13   bilateral hearing aids used  . Hyperlipidemia   . Hypertension   . Ischemic heart disease   . left Inguinal hernia 02/01/2012   Repaired 05/01/12   . MI (myocardial infarction) ([place])   . Mixed hyperlipidemia 05/16/2017  . Papilloma of larynx   . PONV (postoperative nausea and vomiting)   . Shortness of breath 11/11/2011  . SOB (shortness of breath)  on exertion     Tobacco Use: Social History   Tobacco Use  Smoking Status Former Smoker  . Quit date: 10/30/1990  . Years since quitting: 29.9  Smokeless Tobacco Never Used    Labs: Recent Review Flowsheet Data    Labs for ITP Cardiac and Pulmonary Rehab Latest Ref Rng & Units 07/22/2014 01/21/2015 08/11/2015 04/19/2016 08/29/2019   Cholestrol 100 - 199 mg/dL 161 159 165 - 164   LDLCALC 0 - 99 mg/dL 87 79 89 - 85   HDL >39 mg/dL 50.10 49.10 65 - 57   Trlycerides 0 - 149 mg/dL 119.0 153.0(H) 56 - 126   Hemoglobin A1c <5.7 % - - - - -   TCO2 0 - 100 mmol/L - - - 27 -      Capillary Blood  Glucose: Lab Results  Component Value Date   GLUCAP 116 (H) 02/07/2019   GLUCAP 146 (H) 11/12/2011     Pulmonary Assessment Scores:  Pulmonary Assessment Scores    Row Name 09/17/20 1127         ADL UCSD   SOB Score total 67  Pre           CAT Score   CAT Score Pre 26           mMRC Score   mMRC Score 4           UCSD: Self-administered rating of dyspnea associated with activities of daily living (ADLs) 6-point scale (0 = "not at all" to 5 = "maximal or unable to do because of breathlessness")  Scoring Scores range from 0 to 120.  Minimally important difference is 5 units  CAT: CAT can identify the health impairment of COPD patients and is better correlated with disease progression.  CAT has a scoring range of zero to 40. The CAT score is classified into four groups of low (less than 10), medium (10 - 20), high (21-30) and very high (31-40) based on the impact level of disease on health status. A CAT score over 10 suggests significant symptoms.  A worsening CAT score could be explained by an exacerbation, poor medication adherence, poor inhaler technique, or progression of COPD or comorbid conditions.  CAT MCID is 2 points  mMRC: mMRC (Modified Medical Research Council) Dyspnea Scale is used to assess the degree of baseline functional disability in patients of respiratory disease due to dyspnea. No minimal important difference is established. A decrease in score of 1 point or greater is considered a positive change.   Pulmonary Function Assessment:   Exercise Target Goals: Exercise Program Goal: Individual exercise prescription set using results from initial 6 min walk test and THRR while considering  patient's activity barriers and safety.   Exercise Prescription Goal: Initial exercise prescription builds to 30-45 minutes a day of aerobic activity, 2-3 days per week.  Home exercise guidelines will be given to patient during program as part of exercise prescription  that the participant will acknowledge.  Activity Barriers & Risk Stratification:  Activity Barriers & Cardiac Risk Stratification - 09/17/20 1350      Activity Barriers & Cardiac Risk Stratification   Comments tremors           6 Minute Walk:  6 Minute Walk    Row Name 09/17/20 1045         6 Minute Walk   Phase Initial     Distance 1000 feet     Walk Time 6 minutes     # of  Rest Breaks 0     MPH 1.9     METS 3     RPE 14     Perceived Dyspnea  3     VO2 Peak 10.5     Symptoms Yes (comment)     Comments Bilateral calf pain 5/10. Left buttock pain 7/10, SOB RPD = 3     Resting HR 100 bpm     Resting BP 130/60     Resting Oxygen Saturation  100 %     Exercise Oxygen Saturation  during 6 min walk 97 %     Max Ex. HR 123 bpm     Max Ex. BP 172/74     2 Minute Post BP 150/70           Interval HR   1 Minute HR 118     2 Minute HR 120     3 Minute HR 120     4 Minute HR 120     5 Minute HR 122     6 Minute HR 123     2 Minute Post HR 110           Interval Oxygen   Baseline Oxygen Saturation % 100 %     1 Minute Oxygen Saturation % 99 %     1 Minute Liters of Oxygen 2 L     2 Minute Oxygen Saturation % 97 %     2 Minute Liters of Oxygen 2 L     3 Minute Oxygen Saturation % 97 %     3 Minute Liters of Oxygen 2 L     4 Minute Oxygen Saturation % 97 %     4 Minute Liters of Oxygen 2 L     5 Minute Oxygen Saturation % 97 %     5 Minute Liters of Oxygen 2 L     6 Minute Oxygen Saturation % 97 %     6 Minute Liters of Oxygen 2 L     2 Minute Post Oxygen Saturation % 99 %     2 Minute Post Liters of Oxygen 2 L            Oxygen Initial Assessment:  Oxygen Initial Assessment - 09/17/20 0942      Home Oxygen   Home Oxygen Device Home Concentrator;Portable Concentrator   [employer]   Sleep Oxygen Prescription Continuous    Liters per minute 2    Home Exercise Oxygen Prescription Continuous   uses pulse 2L   Liters per minute 2    Home Resting Oxygen  Prescription None    Compliance with Home Oxygen Use Yes      Initial 6 min Walk   Oxygen Used Continuous    Liters per minute 2      Program Oxygen Prescription   Program Oxygen Prescription Continuous    Liters per minute 2      Intervention   Short Term Goals To learn and exhibit compliance with exercise, home and travel O2 prescription;To learn and understand importance of monitoring SPO2 with pulse oximeter and demonstrate accurate use of the pulse oximeter.;To learn and understand importance of maintaining oxygen saturations>88%;To learn and demonstrate proper pursed lip breathing techniques or other breathing techniques.;To learn and demonstrate proper use of respiratory medications    Long  Term Goals Exhibits compliance with exercise, home and travel O2 prescription;Verbalizes importance of monitoring SPO2 with pulse oximeter and return demonstration;Maintenance of O2 saturations>88%;Exhibits proper breathing techniques,  such as pursed lip breathing or other method taught during program session;Compliance with respiratory medication;Demonstrates proper use of MDI's           Oxygen Re-Evaluation:  Oxygen Re-Evaluation    Row Name 10/12/20 0730             Program Oxygen Prescription   Program Oxygen Prescription Continuous       Liters per minute 2               Home Oxygen   Home Oxygen Device Home Concentrator;Portable Concentrator       Sleep Oxygen Prescription Continuous       Liters per minute 2       Home Exercise Oxygen Prescription Continuous       Liters per minute 2       Home Resting Oxygen Prescription None       Compliance with Home Oxygen Use Yes               Goals/Expected Outcomes   Short Term Goals To learn and exhibit compliance with exercise, home and travel O2 prescription;To learn and understand importance of monitoring SPO2 with pulse oximeter and demonstrate accurate use of the pulse oximeter.;To learn and understand importance of  maintaining oxygen saturations>88%;To learn and demonstrate proper pursed lip breathing techniques or other breathing techniques.;To learn and demonstrate proper use of respiratory medications       Long  Term Goals Exhibits compliance with exercise, home and travel O2 prescription;Verbalizes importance of monitoring SPO2 with pulse oximeter and return demonstration;Maintenance of O2 saturations>88%;Exhibits proper breathing techniques, such as pursed lip breathing or other method taught during program session;Compliance with respiratory medication;Demonstrates proper use of MDI's       Goals/Expected Outcomes Compliance and understanding of pursed lip breathing and oxygen saturation              Oxygen Discharge (Final Oxygen Re-Evaluation):  Oxygen Re-Evaluation - 10/12/20 0730      Program Oxygen Prescription   Program Oxygen Prescription Continuous    Liters per minute 2      Home Oxygen   Home Oxygen Device Home Concentrator;Portable Concentrator    Sleep Oxygen Prescription Continuous    Liters per minute 2    Home Exercise Oxygen Prescription Continuous    Liters per minute 2    Home Resting Oxygen Prescription None    Compliance with Home Oxygen Use Yes      Goals/Expected Outcomes   Short Term Goals To learn and exhibit compliance with exercise, home and travel O2 prescription;To learn and understand importance of monitoring SPO2 with pulse oximeter and demonstrate accurate use of the pulse oximeter.;To learn and understand importance of maintaining oxygen saturations>88%;To learn and demonstrate proper pursed lip breathing techniques or other breathing techniques.;To learn and demonstrate proper use of respiratory medications    Long  Term Goals Exhibits compliance with exercise, home and travel O2 prescription;Verbalizes importance of monitoring SPO2 with pulse oximeter and return demonstration;Maintenance of O2 saturations>88%;Exhibits proper breathing techniques, such as  pursed lip breathing or other method taught during program session;Compliance with respiratory medication;Demonstrates proper use of MDI's    Goals/Expected Outcomes Compliance and understanding of pursed lip breathing and oxygen saturation           Initial Exercise Prescription:  Initial Exercise Prescription - 09/17/20 1300      Date of Initial Exercise RX and Referring Provider   Date 09/17/20    Referring Provider [name], MD  Expected Discharge Date 11/18/20      Oxygen   Oxygen Continuous    Liters 2      NuStep   Level 2    SPM 75    Minutes 25    METs 2      Prescription Details   Frequency (times per week) 2    Duration Progress to 30 minutes of continuous aerobic without signs/symptoms of physical distress      Intensity   THRR 40-80% of Max Heartrate 58-116    Ratings of Perceived Exertion 11-13    Perceived Dyspnea 0-4      Progression   Progression Continue progressive overload as per policy without signs/symptoms or physical distress.      Resistance Training   Training Prescription Yes    Weight blue bands    Reps 10-15           Perform Capillary Blood Glucose checks as needed.  Exercise Prescription Changes:  Exercise Prescription Changes    Row Name 09/28/20 1100             Response to Exercise   Blood Pressure (Admit) 104/50       Blood Pressure (Exercise) 144/54       Blood Pressure (Exit) 96/54       Heart Rate (Admit) 95 bpm       Heart Rate (Exercise) 95 bpm       Heart Rate (Exit) 91 bpm       Oxygen Saturation (Admit) 99 %       Oxygen Saturation (Exercise) 100 %       Oxygen Saturation (Exit) 100 %       Rating of Perceived Exertion (Exercise) 11       Perceived Dyspnea (Exercise) 1       Duration Progress to 45 minutes of aerobic exercise without signs/symptoms of physical distress       Intensity Other (comment)  4-80% of HRR               Progression   Progression Continue to progress workloads to maintain  intensity without signs/symptoms of physical distress.               Resistance Training   Training Prescription Yes       Weight orange bands       Reps 10-15       Time 10 Minutes               Oxygen   Oxygen Continuous       Liters 2               NuStep   Level 2       SPM 80       Minutes 30       METs 1.8              Exercise Comments:  Exercise Comments    Row Name 09/21/20 1154           Exercise Comments Pt completed first day of exercise and tolerated well with no complaints or concerns. He remembered most of the band exercises and stretches on his own from the last time he participated in rehab. He was independent with most of the exercises.              Exercise Goals and Review:  Exercise Goals    Row Name 09/17/20 [phone]  Exercise Goals   Increase Physical Activity Yes       Expected Outcomes Long Term: Exercising regularly at least 3-5 days a week.;Long Term: Add in home exercise to make exercise part of routine and to increase amount of physical activity.;Short Term: Attend rehab on a regular basis to increase amount of physical activity.       Increase Strength and Stamina Yes       Expected Outcomes Short Term: Increase workloads from initial exercise prescription for resistance, speed, and METs.;Short Term: Perform resistance training exercises routinely during rehab and add in resistance training at home;Long Term: Improve cardiorespiratory fitness, muscular endurance and strength as measured by increased METs and functional capacity (6MWT)       Able to understand and use rate of perceived exertion (RPE) scale Yes       Intervention Provide education and explanation on how to use RPE scale       Expected Outcomes Short Term: Able to use RPE daily in rehab to express subjective intensity level;Long Term:  Able to use RPE to guide intensity level when exercising independently       Able to understand and use Dyspnea scale Yes        Intervention Provide education and explanation on how to use Dyspnea scale       Expected Outcomes Short Term: Able to use Dyspnea scale daily in rehab to express subjective sense of shortness of breath during exertion;Long Term: Able to use Dyspnea scale to guide intensity level when exercising independently       Knowledge and understanding of Target Heart Rate Range (THRR) Yes       Intervention Provide education and explanation of THRR including how the numbers were predicted and where they are located for reference       Expected Outcomes Short Term: Able to state/look up THRR;Short Term: Able to use daily as guideline for intensity in rehab;Long Term: Able to use THRR to govern intensity when exercising independently       Understanding of Exercise Prescription Yes       Intervention Provide education, explanation, and written materials on patient's individual exercise prescription       Expected Outcomes Short Term: Able to explain program exercise prescription;Long Term: Able to explain home exercise prescription to exercise independently              Exercise Goals Re-Evaluation :  Exercise Goals Re-Evaluation    Row Name 10/12/20 0725             Exercise Goal Re-Evaluation   Exercise Goals Review Increase Physical Activity;Increase Strength and Stamina;Able to understand and use rate of perceived exertion (RPE) scale;Able to understand and use Dyspnea scale;Knowledge and understanding of Target Heart Rate Range (THRR);Understanding of Exercise Prescription       Comments Olander has completed 4 exercise sessions and has tolerated well so far. In that short time he has made progressions with workload increases and MET level increases. He is exercising at 2.1 METS on the Nustep. He had been through the program before and on his first session back he was independent with all resistance band exercises. [place] is doing well so far. Will continue to monitor and progress as he is able.        Expected Outcomes Through exercise at rehab and home the patient will decrease shortness of breath with daily activities and feel confident in carrying out an exercise regimn at home.  Discharge Exercise Prescription (Final Exercise Prescription Changes):  Exercise Prescription Changes - 09/28/20 1100      Response to Exercise   Blood Pressure (Admit) 104/50    Blood Pressure (Exercise) 144/54    Blood Pressure (Exit) 96/54    Heart Rate (Admit) 95 bpm    Heart Rate (Exercise) 95 bpm    Heart Rate (Exit) 91 bpm    Oxygen Saturation (Admit) 99 %    Oxygen Saturation (Exercise) 100 %    Oxygen Saturation (Exit) 100 %    Rating of Perceived Exertion (Exercise) 11    Perceived Dyspnea (Exercise) 1    Duration Progress to 45 minutes of aerobic exercise without signs/symptoms of physical distress    Intensity Other (comment)   4-80% of HRR     Progression   Progression Continue to progress workloads to maintain intensity without signs/symptoms of physical distress.      Resistance Training   Training Prescription Yes    Weight orange bands    Reps 10-15    Time 10 Minutes      Oxygen   Oxygen Continuous    Liters 2      NuStep   Level 2    SPM 80    Minutes 30    METs 1.8           Nutrition:  Target Goals: Understanding of nutrition guidelines, daily intake of sodium <1568m, cholesterol <2076m calories 30% from fat and 7% or less from saturated fats, daily to have 5 or more servings of fruits and vegetables.  Biometrics:  Pre Biometrics - 09/17/20 1059      Pre Biometrics   Grip Strength 32 kg            Nutrition Therapy Plan and Nutrition Goals:  Nutrition Therapy & Goals - 09/28/20 1449      Nutrition Therapy   Diet High Calorie, High Protein      Personal Nutrition Goals   Nutrition Goal Pt to identify food quantities necessary to achieve weight gain of 0.25 lbs per week while in cardiac rehab    Personal Goal #2 Eat mini meals 4-5  times per day    Personal Goal #3 Choose nutrient dense foods that are high in calories      Intervention Plan   Intervention Prescribe, educate and counsel regarding individualized specific dietary modifications aiming towards targeted core components such as weight, hypertension, lipid management, diabetes, heart failure and other comorbidities.    Expected Outcomes Long Term Goal: Adherence to prescribed nutrition plan.;Short Term Goal: A plan has been developed with personal nutrition goals set during dietitian appointment.           Nutrition Assessments:  Nutrition Assessments - 09/28/20 1451      Rate Your Plate Scores   Pre Score 45          MEDIFICTS Score Key:  ?70 Need to make dietary changes   40-70 Heart Healthy Diet  ? 40 Therapeutic Level Cholesterol Diet   Picture Your Plate Scores:  <4<62nhealthy dietary pattern with much room for improvement.  41-50 Dietary pattern unlikely to meet recommendations for good health and room for improvement.  51-60 More healthful dietary pattern, with some room for improvement.   >60 Healthy dietary pattern, although there may be some specific behaviors that could be improved.    Nutrition Goals Re-Evaluation:  Nutrition Goals Re-Evaluation    [place] 09/28/20 1450  Goals   Current Weight 100 lb (45.4 kg)       Nutrition Goal Pt to identify food quantities necessary to achieve weight gain of 0.25 lbs per week while in cardiac rehab               Personal Goal #2 Re-Evaluation   Personal Goal #2 Eat mini meals 4-5 times per day               Personal Goal #3 Re-Evaluation   Personal Goal #3 Choose nutrient dense foods that are high in calories              Nutrition Goals Discharge (Final Nutrition Goals Re-Evaluation):  Nutrition Goals Re-Evaluation - 09/28/20 1450      Goals   Current Weight 100 lb (45.4 kg)    Nutrition Goal Pt to identify food quantities necessary to achieve weight  gain of 0.25 lbs per week while in cardiac rehab      Personal Goal #2 Re-Evaluation   Personal Goal #2 Eat mini meals 4-5 times per day      Personal Goal #3 Re-Evaluation   Personal Goal #3 Choose nutrient dense foods that are high in calories           Psychosocial: Target Goals: Acknowledge presence or absence of significant depression and/or stress, maximize coping skills, provide positive support system. Participant is able to verbalize types and ability to use techniques and skills needed for reducing stress and depression.  Initial Review & Psychosocial Screening:  Initial Psych Review & Screening - 09/17/20 0946      Initial Review   Current issues with Current Stress Concerns    Source of Stress Concerns Chronic Illness;Family    Comments wife who moved in with her mother as the health declined this caused Krystian to be home alone to cook and clean the home.  Family dynamics with brother regarding care of the mother.   Wife's mother recently passed away      [place]? Yes      Barriers   Psychosocial barriers to participate in program The patient should benefit from training in stress management and relaxation.      Screening Interventions   Interventions Encouraged to exercise;Provide feedback about the scores to participant    Expected Outcomes Long Term goal: The participant improves quality of Life and PHQ9 Scores as seen by post scores and/or verbalization of changes;Short Term goal: Identification and review with participant of any Quality of Life or Depression concerns found by scoring the questionnaire.;Long Term Goal: Stressors or current issues are controlled or eliminated.;Short Term goal: Utilizing psychosocial counselor, staff and physician to assist with identification of specific Stressors or current issues interfering with healing process. Setting desired goal for each stressor or current issue identified.           Quality of  Life Scores:  Scores of 19 and below usually indicate a poorer quality of life in these areas.  A difference of  2-3 points is a clinically meaningful difference.  A difference of 2-3 points in the total score of the Quality of Life Index has been associated with significant improvement in overall quality of life, self-image, physical symptoms, and general health in studies assessing change in quality of life.  PHQ-9: Recent Review Flowsheet Data    Depression screen [facility] 2/9 09/17/2020 09/17/2020 05/18/2016 01/24/2016   Decreased Interest 0 0 0 0   Down, Depressed, Hopeless 0  0 0 0   PHQ - 2 Score 0 0 0 0   Altered sleeping 0 - - -   Tired, decreased energy 2 - - -   Change in appetite 2 - - -   Feeling bad or failure about yourself  0 - - -   Trouble concentrating 0 - - -   Moving slowly or fidgety/restless 0 - - -   Suicidal thoughts 0 - - -   PHQ-9 Score 4 - - -   Difficult doing work/chores Not difficult at all - - -     Interpretation of Total Score  Total Score Depression Severity:  1-4 = Minimal depression, 5-9 = Mild depression, 10-14 = Moderate depression, 15-19 = Moderately severe depression, 20-27 = Severe depression   Psychosocial Evaluation and Intervention:   Psychosocial Re-Evaluation:  Psychosocial Re-Evaluation    Row Name 09/20/20 1149 10/05/20 0925           Psychosocial Re-Evaluation   Current issues with Current Stress Concerns Current Stress Concerns      Comments No change since orientation/walk test which was 3 days ago. His wife is able to spend more time with [facility] since her mother died.  Dennard is able to handle his chronic illness in a positive manner.      Expected Outcomes For Aaronmichael to handle stress in healthy ways. For Courvoisier to continue to handle stress in positive ways despite his inability to perform activites that are strenuous.      Interventions Stress management education;Encouraged to attend Pulmonary Rehabilitation for the exercise Encouraged to  attend Pulmonary Rehabilitation for the exercise;Relaxation education;Stress management education      Continue Psychosocial Services  No Follow up required Follow up required by staff             Initial Review   Source of Stress Concerns Chronic Illness;Unable to participate in former interests or hobbies;Unable to perform yard/household activities Chronic Illness;Unable to participate in former interests or hobbies;Unable to perform yard/household activities             Psychosocial Discharge (Final Psychosocial Re-Evaluation):  Psychosocial Re-Evaluation - 10/05/20 0925      Psychosocial Re-Evaluation   Current issues with Current Stress Concerns    Comments His wife is able to spend more time with Yerick since her mother died.  Erion is able to handle his chronic illness in a positive manner.    Expected Outcomes For Garvis to continue to handle stress in positive ways despite his inability to perform activites that are strenuous.    Interventions Encouraged to attend Pulmonary Rehabilitation for the exercise;Relaxation education;Stress management education    Continue Psychosocial Services  Follow up required by staff      Initial Review   Source of Stress Concerns Chronic Illness;Unable to participate in former interests or hobbies;Unable to perform yard/household activities           Education: Education Goals: Education classes will be provided on a weekly basis, covering required topics. Participant will state understanding/return demonstration of topics presented.  Learning Barriers/Preferences:  Learning Barriers/Preferences - 09/17/20 1350      Learning Barriers/Preferences   Learning Barriers Hearing;Sight           Education Topics: Risk Factor Reduction:  -Group instruction that is supported by a PowerPoint presentation. Instructor discusses the definition of a risk factor, different risk factors for pulmonary disease, and how the heart and lungs work together.      Nutrition  for Pulmonary Patient:  -Group instruction provided by PowerPoint slides, verbal discussion, and written materials to support subject matter. The instructor gives an explanation and review of healthy diet recommendations, which includes a discussion on weight management, recommendations for fruit and vegetable consumption, as well as protein, fluid, caffeine, fiber, sodium, sugar, and alcohol. Tips for eating when patients are short of breath are discussed. Flowsheet Row PULMONARY REHAB OTHER RESPIRATORY from 09/30/2020 in [place]  Date 09/30/20  Educator Handout  [Mets]      Pursed Lip Breathing:  -Group instruction that is supported by demonstration and informational handouts. Instructor discusses the benefits of pursed lip and diaphragmatic breathing and detailed demonstration on how to preform both.   Flowsheet Row PULMONARY REHAB OTHER RESPIRATORY from 09/30/2020 in [place]  Date 02/10/16  Educator RT      Oxygen Safety:  -Group instruction provided by PowerPoint, verbal discussion, and written material to support subject matter. There is an overview of "What is Oxygen" and "Why do we need it".  Instructor also reviews how to create a safe environment for oxygen use, the importance of using oxygen as prescribed, and the risks of noncompliance. There is a brief discussion on traveling with oxygen and resources the patient may utilize. Flowsheet Row PULMONARY REHAB OTHER RESPIRATORY from 09/30/2020 in [place]  Date 02/03/16  Educator Trish Fountain      Oxygen Equipment:  -Group instruction provided by [facility] Staff utilizing handouts, written materials, and equipment demonstrations. Flowsheet Row PULMONARY REHAB OTHER RESPIRATORY from 09/30/2020 in [place]  Date 02/17/16  Educator Ace Gins rep      Signs and Symptoms:  -Group  instruction provided by written material and verbal discussion to support subject matter. Warning signs and symptoms of infection, stroke, and heart attack are reviewed and when to call the physician/911 reinforced. Tips for preventing the spread of infection discussed.   Advanced Directives:  -Group instruction provided by verbal instruction and written material to support subject matter. Instructor reviews Advanced Directive laws and proper instruction for filling out document. Flowsheet Row PULMONARY REHAB OTHER RESPIRATORY from 09/30/2020 in [place]  Date 03/16/16  Educator Mikki Santee [place]      Pulmonary Video:  -Group video education that reviews the importance of medication and oxygen compliance, exercise, good nutrition, pulmonary hygiene, and pursed lip and diaphragmatic breathing for the pulmonary patient. Flowsheet Row PULMONARY REHAB OTHER RESPIRATORY from 09/30/2020 in [place]  Date 04/06/16      Exercise for the Pulmonary Patient:  -Group instruction that is supported by a PowerPoint presentation. Instructor discusses benefits of exercise, core components of exercise, frequency, duration, and intensity of an exercise routine, importance of utilizing pulse oximetry during exercise, safety while exercising, and options of places to exercise outside of rehab.   Flowsheet Row PULMONARY REHAB OTHER RESPIRATORY from 09/30/2020 in [place]  Date 05/04/16  Educator ep      Pulmonary Medications:  -Verbally interactive group education provided by instructor with focus on inhaled medications and proper administration. Flowsheet Row PULMONARY REHAB OTHER RESPIRATORY from 09/30/2020 in [place]  Date 02/10/16  Educator RT      Anatomy and Physiology of the Respiratory System and Intimacy:  -Group instruction provided by PowerPoint, verbal discussion,  and written material to support subject matter. Instructor reviews  respiratory cycle and anatomical components of the respiratory system and their functions. Instructor also reviews differences in obstructive and restrictive respiratory diseases with examples of each. Intimacy, Sex, and Sexuality differences are reviewed with a discussion on how relationships can change when diagnosed with pulmonary disease. Common sexual concerns are reviewed. Flowsheet Row PULMONARY REHAB OTHER RESPIRATORY from 09/30/2020 in [place]  Date 03/09/16  Educator Trish Fountain      MD [place] group question and answer session with a medical doctor that allows participants to ask questions that relate to their pulmonary disease state.   OTHER EDUCATION -Group or individual verbal, written, or video instructions that support the educational goals of the pulmonary rehab program.   Holiday Eating Survival Tips:  -Group instruction provided by PowerPoint slides, verbal discussion, and written materials to support subject matter. The instructor gives patients tips, tricks, and techniques to help them not only survive but enjoy the holidays despite the onslaught of food that accompanies the holidays.   Knowledge Questionnaire Score:  Knowledge Questionnaire Score - 09/17/20 1137      Knowledge Questionnaire Score   Pre Score 12/13           Core Components/Risk Factors/Patient Goals at Admission:  Personal Goals and Risk Factors at Admission - 09/17/20 0957      Core Components/Risk Factors/Patient Goals on Admission    Weight Management Weight Gain;Yes    Admit Weight 102 lb (46.3 kg)    Goal Weight: Short Term 115 lb (52.2 kg)    Goal Weight: Long Term 125 lb (56.7 kg)    Improve shortness of breath with ADL's Yes    Intervention Provide education, individualized exercise plan and daily activity instruction to help decrease symptoms of SOB with activities of daily living.     Expected Outcomes Short Term: Improve cardiorespiratory fitness to achieve a reduction of symptoms when performing ADLs;Long Term: Be able to perform more ADLs without symptoms or delay the onset of symptoms    Hypertension Yes    Intervention Provide education on lifestyle modifcations including regular physical activity/exercise, weight management, moderate sodium restriction and increased consumption of fresh fruit, vegetables, and low fat dairy, alcohol moderation, and smoking cessation.;Monitor prescription use compliance.    Expected Outcomes Short Term: Continued assessment and intervention until BP is < 140/79m HG in hypertensive participants. < 130/865mHG in hypertensive participants with diabetes, heart failure or chronic kidney disease.;Long Term: Maintenance of blood pressure at goal levels.    Lipids Yes    Intervention Provide education and support for participant on nutrition & aerobic/resistive exercise along with prescribed medications to achieve LDL <7056mHDL >60m52m  Stress Yes    Intervention Offer individual and/or small group education and counseling on adjustment to heart disease, stress management and health-related lifestyle change. Teach and support self-help strategies.    Expected Outcomes Short Term: Participant demonstrates changes in health-related behavior, relaxation and other stress management skills, ability to obtain effective social support, and compliance with psychotropic medications if prescribed.;Long Term: Emotional wellbeing is indicated by absence of clinically significant psychosocial distress or social isolation.           Core Components/Risk Factors/Patient Goals Review:   Goals and Risk Factor Review    Row Name 09/20/20 1151 10/05/20 0928           Core Components/Risk Factors/Patient Goals Review   Personal Goals Review Develop more efficient breathing techniques such as purse lipped breathing and  diaphragmatic breathing and practicing  self-pacing with activity.;Increase knowledge of respiratory medications and ability to use respiratory devices properly.;Improve shortness of breath with ADL's Increase knowledge of respiratory medications and ability to use respiratory devices properly.;Improve shortness of breath with ADL's;Develop more efficient breathing techniques such as purse lipped breathing and diaphragmatic breathing and practicing self-pacing with activity.      Review Has not started exercising in pulmonary rehab yet , but will in the next day. Frederic just started the program, has attended 3 exercise sessions, was in pulmonary rehab in 2017 and still remembers the thera-band exercises and performs them at home occassionally.      Expected Outcomes For patient to make progression towards goals in the next 30 days. To see and increase in strength, stamina, and endurance in the next full 30 days.             Core Components/Risk Factors/Patient Goals at Discharge (Final Review):   Goals and Risk Factor Review - 10/05/20 0928      Core Components/Risk Factors/Patient Goals Review   Personal Goals Review Increase knowledge of respiratory medications and ability to use respiratory devices properly.;Improve shortness of breath with ADL's;Develop more efficient breathing techniques such as purse lipped breathing and diaphragmatic breathing and practicing self-pacing with activity.    Review Ysabel just started the program, has attended 3 exercise sessions, was in pulmonary rehab in 2017 and still remembers the thera-band exercises and performs them at home occassionally.    Expected Outcomes To see and increase in strength, stamina, and endurance in the next full 30 days.           ITP Comments:   Comments: ITP REVIEW Pt is making expected progress toward pulmonary rehab goals after completing 4 sessions. Recommend continued exercise, life style modification, education, and utilization of breathing techniques to increase  stamina and strength and decrease shortness of breath with exertion.

## 2020-10-12 NOTE — Progress Notes (Signed)
Daily Session Note  Patient Details  Name: LESLIE LANGILLE MRN: [id number] Date of Birth: 04/04/1946 Referring Provider:   April Manson Pulmonary Rehab Walk Test from 09/17/2020 in [place]  Referring Provider [name], MD      Encounter Date: 10/12/2020  Check In:  Session Check In - 10/12/20 1129      Check-In   Supervising physician immediately available to respond to emergencies [facility] immediately available    Physician(s) Dr. Cyndia Skeeters    Location [facility]    Staff Present Rosebud Poles, RN, BSN;Hamda Klutts Ysidro Evert, RN;Jessica Hassell Done, MS, ACSM-CEP, Exercise Physiologist    Virtual Visit No    Medication changes reported     No    Fall or balance concerns reported    No    Tobacco Cessation No Change    Warm-up and Cool-down Performed on first and last piece of equipment    Resistance Training Performed Yes    VAD Patient? No    PAD/SET Patient? No      Pain Assessment   Currently in Pain? No/denies    Multiple Pain Sites No           Capillary Blood Glucose: No results found for this or any previous visit (from the past 24 hour(s)).   Exercise Prescription Changes - 10/12/20 1100      Response to Exercise   Blood Pressure (Admit) 104/56    Blood Pressure (Exercise) 130/64    Blood Pressure (Exit) 100/56    Heart Rate (Admit) 92 bpm    Heart Rate (Exercise) 110 bpm    Heart Rate (Exit) 95 bpm    Oxygen Saturation (Admit) 99 %    Oxygen Saturation (Exercise) 99 %    Oxygen Saturation (Exit) 100 %    Rating of Perceived Exertion (Exercise) 14    Perceived Dyspnea (Exercise) 3    Duration Continue with 30 min of aerobic exercise without signs/symptoms of physical distress.    Intensity THRR unchanged      Progression   Progression Continue to progress workloads to maintain intensity without signs/symptoms of physical distress.      Resistance Training   Training Prescription Yes    Weight orange  bands    Reps 10-15    Time 10 Minutes      Oxygen   Oxygen Continuous    Liters 2      NuStep   Level 3    SPM 80    Minutes 15    METs 2      Track   Laps 12    Minutes 15           Social History   Tobacco Use  Smoking Status Former Smoker  . Quit date: 10/30/1990  . Years since quitting: 29.9  Smokeless Tobacco Never Used    Goals Met:  Exercise tolerated well No report of cardiac concerns or symptoms Strength training completed today  Goals Unmet:  Not Applicable  Comments: Service time is from 1035 to 1137    Dr. [name] is Medical Director for Cardiac Rehab at [facility].

## 2020-10-14 ENCOUNTER — Encounter (HOSPITAL_COMMUNITY)
Admission: RE | Admit: 2020-10-14 | Discharge: 2020-10-14 | Disposition: A | Discharge status: Home / Self Care | Payer: Medicare Other | Source: Ambulatory Visit | Attending: Cardiology | Admitting: Cardiology

## 2020-10-14 ENCOUNTER — Other Ambulatory Visit: Payer: Self-pay

## 2020-10-14 DIAGNOSIS — J438 Other emphysema: Secondary | ICD-10-CM

## 2020-10-14 NOTE — Progress Notes (Signed)
Daily Session Note  Patient Details  Name: William Lee MRN: [id number] Date of Birth: 29-Sep-1946 Referring Provider:   April Manson Pulmonary Rehab Walk Test from 09/17/2020 in [place]  Referring Provider [name], MD      Encounter Date: 10/14/2020  Check In:  Session Check In - 10/14/20 1109      Check-In   Supervising physician immediately available to respond to emergencies [facility] immediately available    Physician(s) Dr. Algis Liming    [facility]    Staff Present Rosebud Poles, RN, BSN;Lisa Ysidro Evert, RN;Jessica Hassell Done, MS, ACSM-CEP, Exercise Physiologist    Virtual Visit No    Medication changes reported     No    Fall or balance concerns reported    No    Tobacco Cessation No Change    Warm-up and Cool-down Performed on first and last piece of equipment    Resistance Training Performed Yes    VAD Patient? No    PAD/SET Patient? No      Pain Assessment   Currently in Pain? No/denies    Multiple Pain Sites No           Capillary Blood Glucose: No results found for this or any previous visit (from the past 24 hour(s)).    Social History   Tobacco Use  Smoking Status Former Smoker  . Quit date: 10/30/1990  . Years since quitting: 29.9  Smokeless Tobacco Never Used    Goals Met:  Proper associated with RPD/PD & O2 Sat Exercise tolerated well Strength training completed today  Goals Unmet:  Not Applicable  Comments: Service time is from 1040 to 87    Dr. [name] is Medical Director for Cardiac Rehab at [facility].

## 2020-10-19 ENCOUNTER — Encounter (HOSPITAL_COMMUNITY)
Admission: RE | Admit: 2020-10-19 | Discharge: 2020-10-19 | Disposition: A | Discharge status: Home / Self Care | Payer: Medicare Other | Source: Ambulatory Visit | Attending: Cardiology | Admitting: Cardiology

## 2020-10-19 ENCOUNTER — Other Ambulatory Visit: Payer: Self-pay

## 2020-10-19 DIAGNOSIS — J438 Other emphysema: Secondary | ICD-10-CM

## 2020-10-19 NOTE — Progress Notes (Signed)
Daily Session Note  Patient Details  Name: William Lee MRN: [id number] Date of Birth: October 11, 1946 Referring Provider:   April Manson Pulmonary Rehab Walk Test from 09/17/2020 in [place]  Referring Provider [name], MD      Encounter Date: 10/19/2020  Check In:  Session Check In - 10/19/20 1104      Check-In   Supervising physician immediately available to respond to emergencies [facility] immediately available    Physician(s) Dr. Cruzita Lederer    [facility]    Staff Present Rosebud Poles, RN, BSN;Lisa Ysidro Evert, RN;Jessica Hassell Done, MS, ACSM-CEP, Exercise Physiologist    Virtual Visit No    Medication changes reported     No    Fall or balance concerns reported    No    Tobacco Cessation No Change    Warm-up and Cool-down Performed on first and last piece of equipment    Resistance Training Performed Yes    VAD Patient? No    PAD/SET Patient? No      Pain Assessment   Currently in Pain? No/denies    Multiple Pain Sites No           Capillary Blood Glucose: No results found for this or any previous visit (from the past 24 hour(s)).    Social History   Tobacco Use  Smoking Status Former Smoker  . Quit date: 10/30/1990  . Years since quitting: 29.9  Smokeless Tobacco Never Used    Goals Met:  Proper associated with RPD/PD & O2 Sat Exercise tolerated well Strength training completed today  Goals Unmet:  Not Applicable  Comments: Service time is from 1045 to 1148    Dr. [name] is Medical Director for Cardiac Rehab at [facility].

## 2020-10-21 ENCOUNTER — Encounter (HOSPITAL_COMMUNITY)
Admission: RE | Admit: 2020-10-21 | Discharge: 2020-10-21 | Disposition: A | Discharge status: Home / Self Care | Payer: Medicare Other | Source: Ambulatory Visit | Attending: Cardiology | Admitting: Cardiology

## 2020-10-21 ENCOUNTER — Other Ambulatory Visit: Payer: Self-pay

## 2020-10-21 DIAGNOSIS — J438 Other emphysema: Secondary | ICD-10-CM | POA: Diagnosis not present

## 2020-10-21 NOTE — Progress Notes (Signed)
Daily Session Note  Patient Details  Name: William Lee MRN: [id number] Date of Birth: Nov 17, 1945 Referring Provider:   April Manson Pulmonary Rehab Walk Test from 09/17/2020 in [place]  Referring Provider [name], MD      Encounter Date: 10/21/2020  Check In:  Session Check In - 10/21/20 1041      Check-In   Supervising physician immediately available to respond to emergencies [facility] immediately available    Physician(s) Dr. Cruzita Lederer    [facility]    Staff Present Rosebud Poles, RN, BSN;Lisa Ysidro Evert, RN;Jessica Hassell Done, MS, ACSM-CEP, Exercise Physiologist    Virtual Visit No    Medication changes reported     No    Fall or balance concerns reported    No    Tobacco Cessation No Change    Warm-up and Cool-down Performed on first and last piece of equipment    Resistance Training Performed Yes    VAD Patient? No    PAD/SET Patient? No      Pain Assessment   Currently in Pain? No/denies    Multiple Pain Sites No           Capillary Blood Glucose: No results found for this or any previous visit (from the past 24 hour(s)).    Social History   Tobacco Use  Smoking Status Former Smoker  . Quit date: 10/30/1990  . Years since quitting: 29.9  Smokeless Tobacco Never Used    Goals Met:  Proper associated with RPD/PD & O2 Sat Exercise tolerated well Strength training completed today  Goals Unmet:  Not Applicable  Comments: Service time is from 1030 to 1128    Dr. [name] is Medical Director for Cardiac Rehab at [facility].

## 2020-10-25 ENCOUNTER — Other Ambulatory Visit: Payer: Self-pay | Admitting: Cardiovascular Disease

## 2020-10-26 ENCOUNTER — Other Ambulatory Visit: Payer: Self-pay

## 2020-10-26 ENCOUNTER — Encounter (HOSPITAL_COMMUNITY)
Admission: RE | Admit: 2020-10-26 | Discharge: 2020-10-26 | Disposition: A | Discharge status: Home / Self Care | Payer: Medicare Other | Source: Ambulatory Visit | Attending: Cardiology | Admitting: Cardiology

## 2020-10-26 VITALS — Wt 105.8 lb

## 2020-10-26 DIAGNOSIS — J438 Other emphysema: Secondary | ICD-10-CM | POA: Diagnosis not present

## 2020-10-26 NOTE — Progress Notes (Signed)
Daily Session Note  Patient Details  Name: William Lee MRN: [id number] Date of Birth: 30-Jul-1946 Referring Provider:   April Manson Pulmonary Rehab Walk Test from 09/17/2020 in [place]  Referring Provider [name], MD      Encounter Date: 10/26/2020  Check In:  Session Check In - 10/26/20 1156      Check-In   Supervising physician immediately available to respond to emergencies [facility] immediately available    Physician(s) Dr. Alfredia Ferguson    [facility]    Staff Present Rosebud Poles, RN, BSN;Lisa Ysidro Evert, RN;Carlisa Eble Hassell Done, MS, ACSM-CEP, Exercise Physiologist    Virtual Visit No    Medication changes reported     No    Fall or balance concerns reported    No    Tobacco Cessation No Change    Warm-up and Cool-down Performed on first and last piece of equipment    Resistance Training Performed Yes    VAD Patient? No    PAD/SET Patient? No      Pain Assessment   Currently in Pain? No/denies    Multiple Pain Sites No           Capillary Blood Glucose: No results found for this or any previous visit (from the past 24 hour(s)).   Exercise Prescription Changes - 10/26/20 1200      Response to Exercise   Blood Pressure (Admit) 122/50    Blood Pressure (Exercise) 140/54    Blood Pressure (Exit) 102/50    Heart Rate (Admit) 95 bpm    Heart Rate (Exercise) 105 bpm    Heart Rate (Exit) 102 bpm    Oxygen Saturation (Admit) 99 %    Oxygen Saturation (Exercise) 98 %    Oxygen Saturation (Exit) 98 %    Rating of Perceived Exertion (Exercise) 14    Perceived Dyspnea (Exercise) 3    Duration Progress to 45 minutes of aerobic exercise without signs/symptoms of physical distress    Intensity THRR unchanged      Progression   Progression Continue to progress workloads to maintain intensity without signs/symptoms of physical distress.      Resistance Training   Training Prescription Yes    Weight Orange  bands    Reps 10-15    Time 10 Minutes      Oxygen   Oxygen Continuous    Liters 2      NuStep   Level 4    SPM 80    Minutes 15    METs 2.4      Track   Laps 13    Minutes 15    METs 2.5           Social History   Tobacco Use  Smoking Status Former Smoker  . Quit date: 10/30/1990  . Years since quitting: 30.0  Smokeless Tobacco Never Used    Goals Met:  Proper associated with RPD/PD & O2 Sat [place] with exercise equipment Exercise tolerated well No report of cardiac concerns or symptoms Strength training completed today  Goals Unmet:  Not Applicable  Comments: Service time is from 1040 to 1140    Dr. [name] is Medical Director for Cardiac Rehab at [facility].

## 2020-10-28 ENCOUNTER — Encounter (HOSPITAL_COMMUNITY)
Admission: RE | Admit: 2020-10-28 | Discharge: 2020-10-28 | Disposition: A | Discharge status: Home / Self Care | Payer: Medicare Other | Source: Ambulatory Visit | Attending: Cardiology | Admitting: Cardiology

## 2020-10-28 ENCOUNTER — Other Ambulatory Visit: Payer: Self-pay

## 2020-10-28 DIAGNOSIS — J438 Other emphysema: Secondary | ICD-10-CM | POA: Diagnosis not present

## 2020-10-28 NOTE — Progress Notes (Signed)
Daily Session Note  Patient Details  Name: William Lee MRN: [id number] Date of Birth: 06-27-1946 Referring Provider:   April Manson Pulmonary Rehab Walk Test from 09/17/2020 in [place]  Referring Provider [name], MD      Encounter Date: 10/28/2020  Check In:  Session Check In - 10/28/20 1133      Check-In   Supervising physician immediately available to respond to emergencies [facility] immediately available    Physician(s) Dr. Florene Glen    [facility]    Staff Present Rosebud Poles, RN, BSN;Elex Mainwaring Ysidro Evert, RN;Jessica Hassell Done, MS, ACSM-CEP, Exercise Physiologist    Virtual Visit No    Medication changes reported     No    Fall or balance concerns reported    No    Warm-up and Cool-down Performed on first and last piece of equipment    Resistance Training Performed Yes    VAD Patient? No    PAD/SET Patient? No      Pain Assessment   Currently in Pain? No/denies    Multiple Pain Sites No           Capillary Blood Glucose: No results found for this or any previous visit (from the past 24 hour(s)).    Social History   Tobacco Use  Smoking Status Former Smoker  . Quit date: 10/30/1990  . Years since quitting: 30.0  Smokeless Tobacco Never Used    Goals Met:  Exercise tolerated well No report of cardiac concerns or symptoms Strength training completed today  Goals Unmet:  Not Applicable  Comments: Service time is from 1040 to 1140    Dr. [name] is Medical Director for Cardiac Rehab at [facility].

## 2020-10-29 IMAGING — CT CT CHEST W/O CM
2 of 3 series · 13 of 30 positions shown, 16 images · non-contrast
Comparison: PET-CT 02/07/2019.  Chest CT 01/29/2019.

CLINICAL DATA: Follow-up lung nodules.

EXAM:
CT CHEST WITHOUT CONTRAST
TECHNIQUE: Multidetector CT imaging of the chest was performed following the
standard protocol without IV contrast.

[Series 2: chest w/(date) · axial · 0.62mm/px · z∈[-303,-23]mm · 10 of 176 slices shown, 13 images]
[im 18/176  mediastinal]
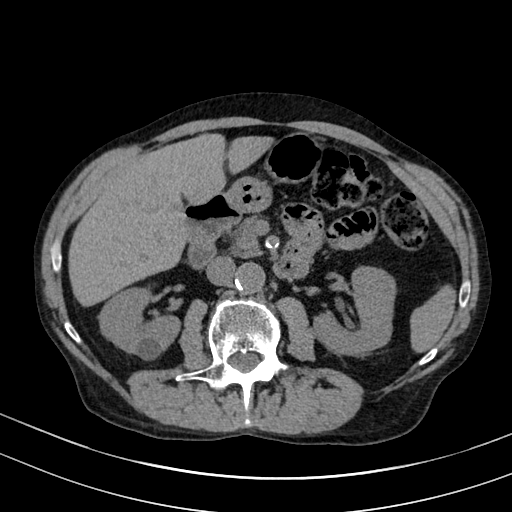
[im 18/176  lung]
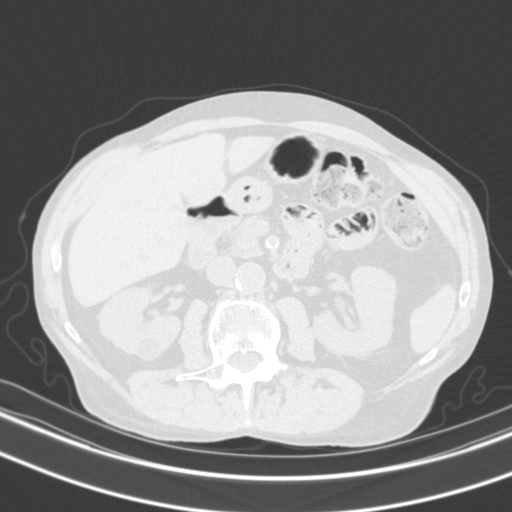
[im 36/176  lung]
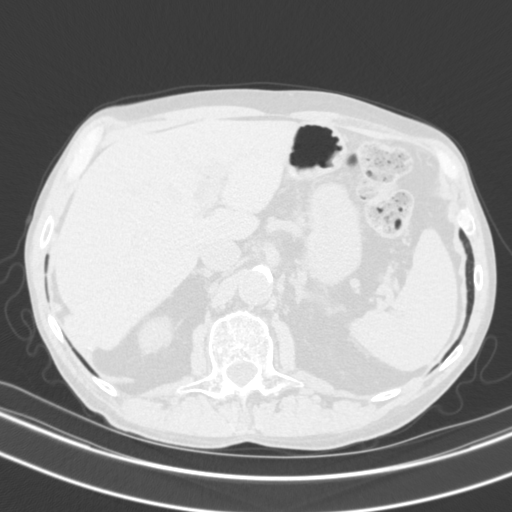
[im 53/176  lung]
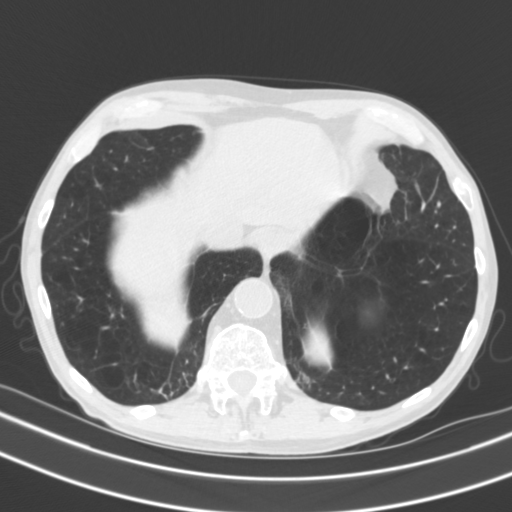
[im 71/176  lung]
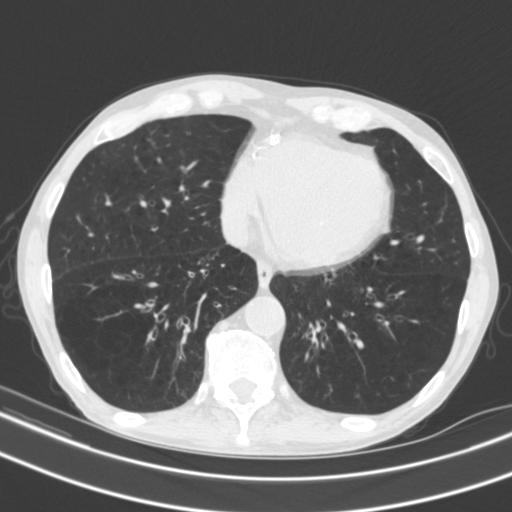
[im 87/176  mediastinal]
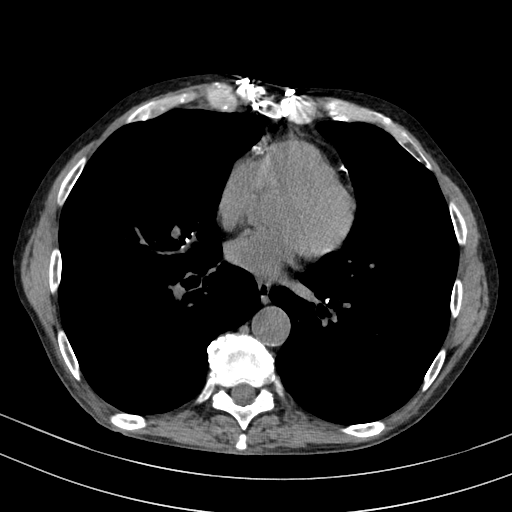
[im 87/176  lung]
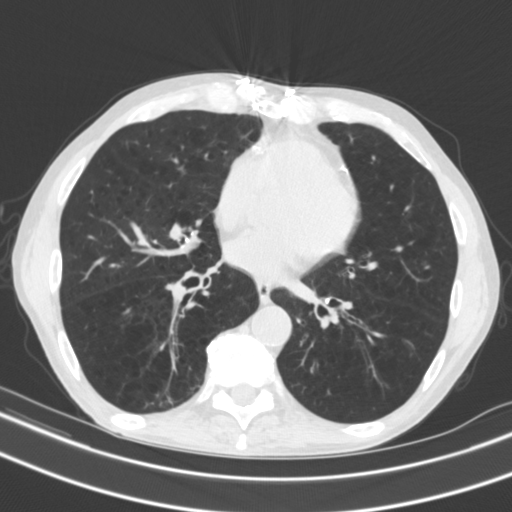
[im 88/176  lung]
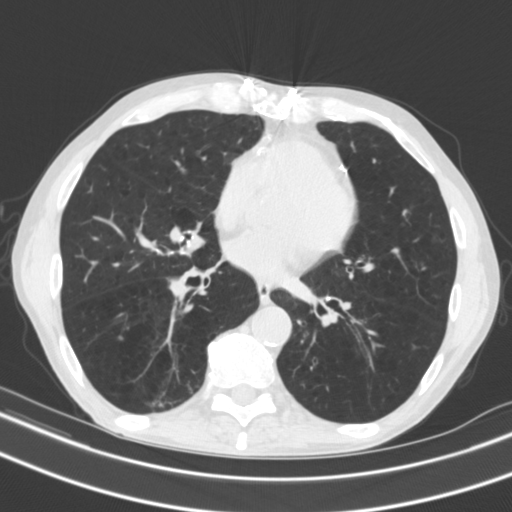
[im 106/176  lung]
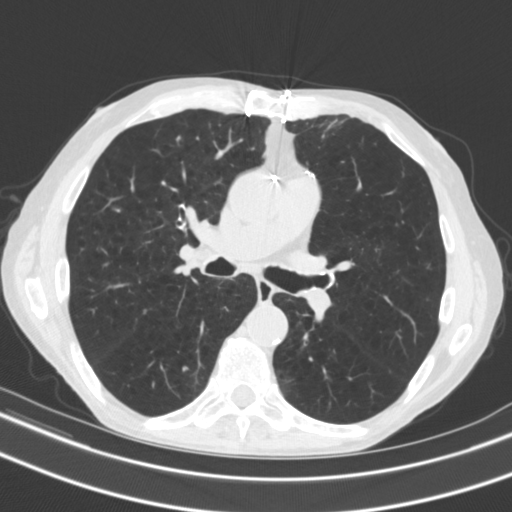
[im 123/176  lung]
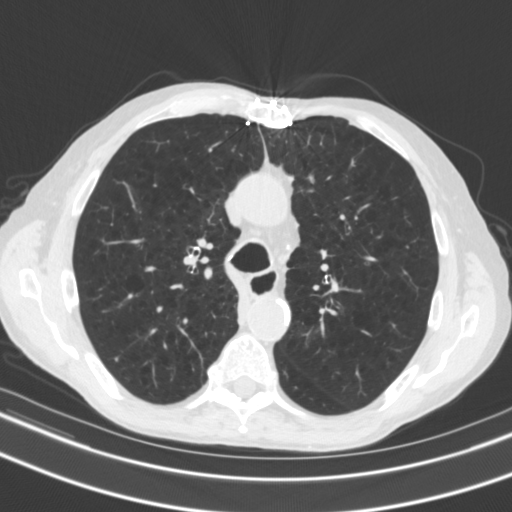
[im 141/176  mediastinal]
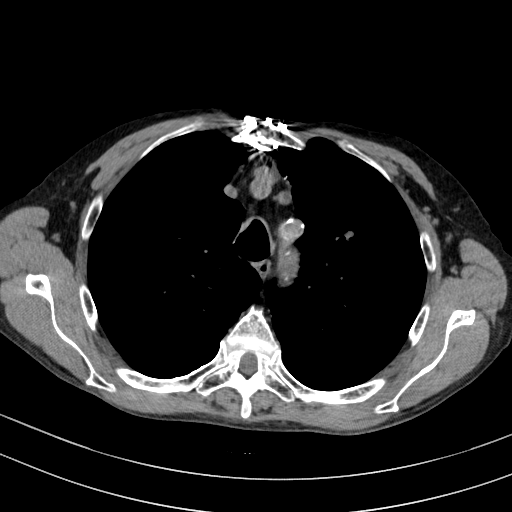
[im 141/176  lung]
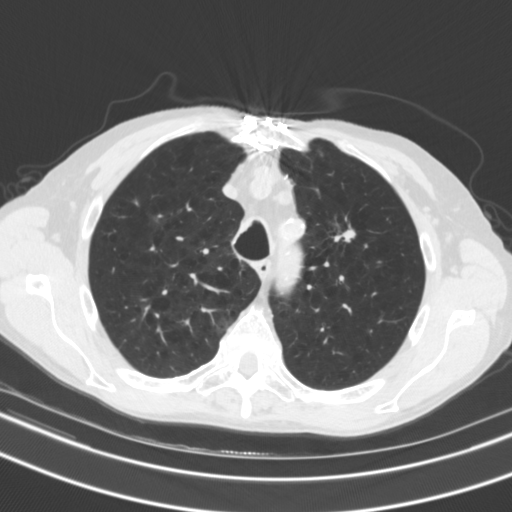
[im 158/176  lung]
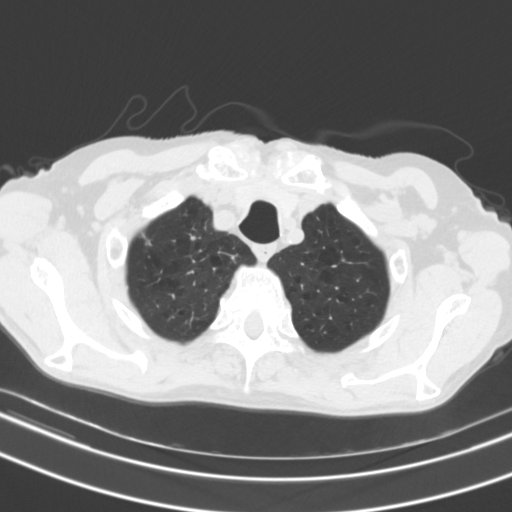

[Series 10: axial mips thick · axial · 0.54mm/px · z∈[-249,-79]mm · 3 of 68 slices shown]
[im 17/68  lung]
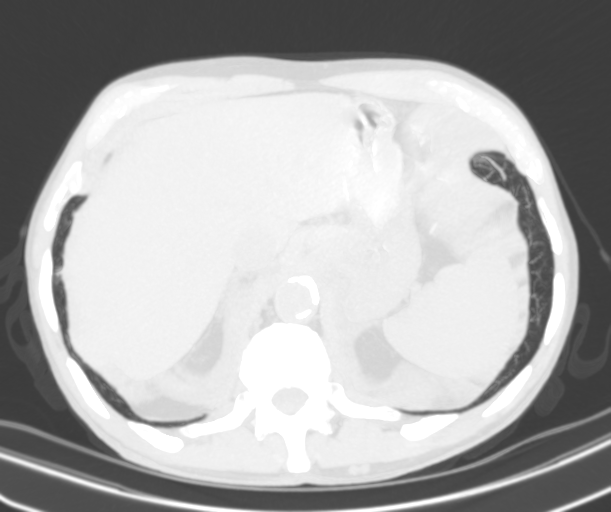
[im 34/68  lung]
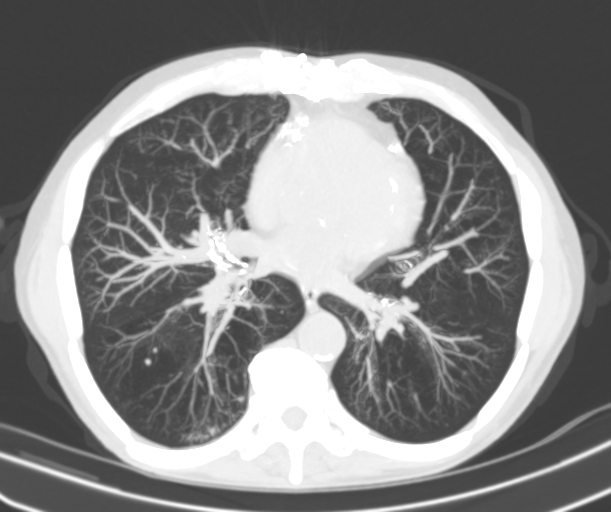
[im 51/68  lung]
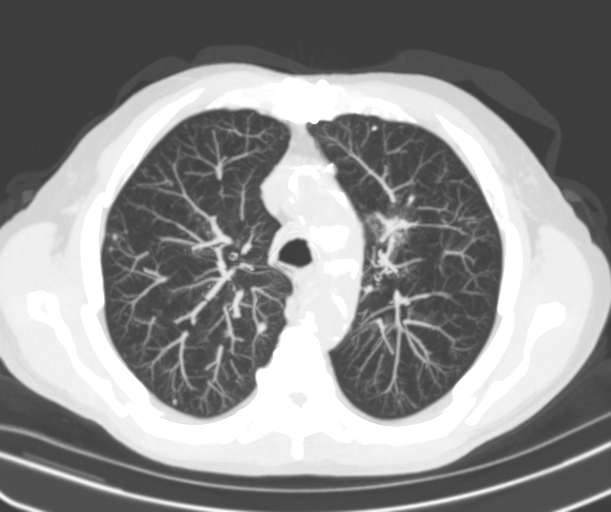

[13 of 30 positions shown; findings below may reference images not displayed]

FINDINGS: Cardiovascular: Aortic atherosclerosis without aneurysm. Prior CABG.
Coronary artery atherosclerosis. Normal heart size. No pericardial
effusion.

Mediastinum/Nodes: No enlarged axillary, mediastinal, or hilar lymph
nodes identified within limitations of noncontrast technique.
Unremarkable thyroid and esophagus.

Lungs/Pleura: No pleural effusion or pneumothorax. Moderately
advanced centrilobular emphysema. Bronchial wall thickening and mild
bronchiectasis. The dominant left upper lobe nodule on the previous
studies is slightly smaller, measuring 10 x 7 mm (series 5, image
50), however an adjacent nodular density located immediately
inferior to this has mildly enlarged measuring 15 x 8 mm (series 5,
image 50). There is mild surrounding ground-glass opacity. Numerous
other smaller nodules throughout both lungs, many of which are
calcified, are unchanged. No new nodule is identified.

Upper Abdomen: Status post cholecystectomy. Unchanged size of 1.6 cm
hyperattenuating right upper pole renal lesion, likely a
proteinaceous/hemorrhagic cyst. Unchanged 1.6 cm hypoattenuating
lesion more inferiorly in the right kidney compatible with a cyst.
Aortic atherosclerosis.

Musculoskeletal: No acute osseous abnormality or suspicious osseous
lesion.
IMPRESSION: 1. Mixed interval changes of 2 adjacent left upper lobe lung nodules
with interval enlargement of the more inferior nodule.
2. Unchanged small nodules elsewhere throughout both lungs.
3. Aortic Atherosclerosis ([id number]) and Emphysema ([id number].P).

## 2020-11-02 ENCOUNTER — Other Ambulatory Visit: Payer: Self-pay

## 2020-11-02 ENCOUNTER — Encounter (HOSPITAL_COMMUNITY)
Admission: RE | Admit: 2020-11-02 | Discharge: 2020-11-02 | Disposition: A | Discharge status: Home / Self Care | Payer: Medicare Other | Source: Ambulatory Visit | Attending: Cardiology | Admitting: Cardiology

## 2020-11-02 DIAGNOSIS — J438 Other emphysema: Secondary | ICD-10-CM

## 2020-11-02 DIAGNOSIS — J449 Chronic obstructive pulmonary disease, unspecified: Secondary | ICD-10-CM | POA: Diagnosis not present

## 2020-11-02 NOTE — Progress Notes (Signed)
Daily Session Note  Patient Details  Name: William Lee MRN: [id number] Date of Birth: 01/11/1946 Referring Provider:   April Manson [facility] Walk Test from 09/17/2020 in [place]  Referring Provider [name], MD      Encounter Date: 11/02/2020  Check In:  Session Check In - 11/02/20 1105      Check-In   Supervising physician immediately available to respond to emergencies [facility] immediately available    Physician(s) Dr. Florene Glen    Location [facility]    Staff Present Rosebud Poles, RN, BSN;Jontay Maston Ysidro Evert, RN;Jessica Hassell Done, MS, ACSM-CEP, Exercise Physiologist    Virtual Visit No    Medication changes reported     No    Fall or balance concerns reported    No    Tobacco Cessation No Change    Warm-up and Cool-down Performed on first and last piece of equipment    Resistance Training Performed Yes    VAD Patient? No    PAD/SET Patient? No      Pain Assessment   Currently in Pain? No/denies    Multiple Pain Sites No           Capillary Blood Glucose: No results found for this or any previous visit (from the past 24 hour(s)).    Social History   Tobacco Use  Smoking Status Former Smoker  . Quit date: 10/30/1990  . Years since quitting: 30.0  Smokeless Tobacco Never Used    Goals Met:  Exercise tolerated well No report of cardiac concerns or symptoms Strength training completed today  Goals Unmet:  Not Applicable  Comments: Service time is from 1040 to 1140    Dr. [name] is Medical Director for Cardiac Rehab at [facility].

## 2020-11-04 ENCOUNTER — Other Ambulatory Visit: Payer: Self-pay

## 2020-11-04 ENCOUNTER — Encounter (HOSPITAL_COMMUNITY)
Admission: RE | Admit: 2020-11-04 | Discharge: 2020-11-04 | Disposition: A | Discharge status: Home / Self Care | Payer: Medicare Other | Source: Ambulatory Visit | Attending: Cardiology | Admitting: Cardiology

## 2020-11-04 DIAGNOSIS — J438 Other emphysema: Secondary | ICD-10-CM

## 2020-11-04 DIAGNOSIS — J449 Chronic obstructive pulmonary disease, unspecified: Secondary | ICD-10-CM | POA: Diagnosis not present

## 2020-11-04 NOTE — Progress Notes (Signed)
Daily Session Note  Patient Details  Name: William Lee MRN: [id number] Date of Birth: 24-Jul-1946 Referring Provider:   April Manson [facility]monary Rehab Walk Test from 09/17/2020 in [place]  Referring Provider [name], MD      Encounter Date: 11/04/2020  Check In:  Session Check In - 11/04/20 1106      Check-In   Supervising physician immediately available to respond to emergencies [facility] immediately available    Physician(s) Dr. Horris Latino    Location [facility]    Staff Present Rosebud Poles, RN, BSN;Tanny Harnack Ysidro Evert, RN;Jessica Hassell Done, MS, ACSM-CEP, Exercise Physiologist    Virtual Visit No    Medication changes reported     No    Fall or balance concerns reported    No    Tobacco Cessation No Change    Warm-up and Cool-down Performed on first and last piece of equipment    Resistance Training Performed Yes    VAD Patient? No    PAD/SET Patient? No      Pain Assessment   Currently in Pain? No/denies    Multiple Pain Sites No           Capillary Blood Glucose: No results found for this or any previous visit (from the past 24 hour(s)).    Social History   Tobacco Use  Smoking Status Former Smoker  . Quit date: 10/30/1990  . Years since quitting: 30.0  Smokeless Tobacco Never Used    Goals Met:  Exercise tolerated well No report of cardiac concerns or symptoms Strength training completed today  Goals Unmet:  Not Applicable  Comments: Service time is from 1040 to 1145    Dr. [name] is Medical Director for Cardiac Rehab at [facility].

## 2020-11-09 ENCOUNTER — Encounter (HOSPITAL_COMMUNITY)
Admission: RE | Admit: 2020-11-09 | Discharge: 2020-11-09 | Disposition: A | Discharge status: Home / Self Care | Payer: Medicare Other | Source: Ambulatory Visit | Attending: Cardiology | Admitting: Cardiology

## 2020-11-09 ENCOUNTER — Other Ambulatory Visit: Payer: Self-pay

## 2020-11-09 DIAGNOSIS — J438 Other emphysema: Secondary | ICD-10-CM

## 2020-11-09 DIAGNOSIS — J449 Chronic obstructive pulmonary disease, unspecified: Secondary | ICD-10-CM | POA: Diagnosis not present

## 2020-11-09 NOTE — Progress Notes (Signed)
Daily Session Note  Patient Details  Name: William Lee MRN: [id number] Date of Birth: 1946/10/09 Referring Provider:   April Manson Pulmonary Rehab Walk Test from 09/17/2020 in [facility]  Referring Provider [name], MD      Encounter Date: 11/09/2020  Check In:  Session Check In - 11/09/20 1129      Check-In   Supervising physician immediately available to respond to emergencies [facility] immediately available    Physician(s) Dr. Horris Latino    Location [facility]    Staff Present Rosebud Poles, RN, BSN;Carlette Wilber Oliphant, RN, Isaac Laud, MS, ACSM-CEP, Exercise Physiologist    Virtual Visit No    Medication changes reported     No    Fall or balance concerns reported    No    Tobacco Cessation No Change    Warm-up and Cool-down Performed on first and last piece of equipment    Resistance Training Performed Yes    VAD Patient? No    PAD/SET Patient? No      Pain Assessment   Currently in Pain? No/denies    Multiple Pain Sites No           Capillary Blood Glucose: No results found for this or any previous visit (from the past 24 hour(s)).   Exercise Prescription Changes - 11/09/20 1200      Response to Exercise   Blood Pressure (Admit) 124/56    Blood Pressure (Exercise) 150/64    Blood Pressure (Exit) 118/60    Heart Rate (Admit) 85 bpm    Heart Rate (Exercise) 117 bpm    Heart Rate (Exit) 95 bpm    Oxygen Saturation (Admit) 100 %    Oxygen Saturation (Exercise) 98 %    Oxygen Saturation (Exit) 93 %    Rating of Perceived Exertion (Exercise) 15    Perceived Dyspnea (Exercise) 3    Duration Continue with 30 min of aerobic exercise without signs/symptoms of physical distress.    Intensity THRR unchanged      Progression   Progression Continue to progress workloads to maintain intensity without signs/symptoms of physical distress.      Resistance Training   Training Prescription Yes    Weight  orange bands    Reps 10-15    Time 10 Minutes      Oxygen   Oxygen Continuous    Liters 2      NuStep   Level 5    SPM 80    Minutes 15    METs 2.5      Track   Laps 10    Minutes 15           Social History   Tobacco Use  Smoking Status Former Smoker  . Quit date: 10/30/1990  . Years since quitting: 30.0  Smokeless Tobacco Never Used    Goals Met:  Proper associated with RPD/PD & O2 Sat Exercise tolerated well Strength training completed today  Goals Unmet:  Not Applicable  Comments: Service time is from 1045 to 1200    Dr. [name] is Medical Director for Cardiac Rehab at [facility].

## 2020-11-09 NOTE — Progress Notes (Signed)
I have reviewed a Home Exercise Prescription with William Lee . William Lee is currently exercising at home with the resistance bands but states he does not do it on a regular basis.  The patient was advised to walk and do resistance band exercises 1-2 additional days a week for 30-45 minutes.  [name] and I discussed how to progress their exercise prescription.  The patient stated that their goals were to survive lung surgery.  The patient stated that they understand the exercise prescription.  We reviewed exercise guidelines, target heart rate during exercise, RPE Scale, weather conditions, rescue inhaler use, endpoints for exercise, warmup and cool down.  Patient is encouraged to come to me with any questions. I will continue to follow up with the patient to assist them with progression and safety.   Rick Duff MS, ACSM CEP 11:59 AM 11/09/2020

## 2020-11-09 NOTE — Progress Notes (Signed)
Pulmonary Individual Treatment Plan  Patient Details  Name: William Lee MRN: [id number] Date of Birth: 06/08/1946 Referring Provider:   April Manson Pulmonary Rehab Walk Test from 09/17/2020 in [place]  Referring Provider [name], MD      Initial Encounter Date:  Flowsheet Row Pulmonary Rehab Walk Test from 09/17/2020 in [place]  Date 09/17/20      Visit Diagnosis: Other emphysema ([place])  Patient's Home Medications on Admission:   Current Outpatient Medications:  .  albuterol (PROVENTIL HFA;VENTOLIN HFA) 108 (90 BASE) MCG/ACT inhaler, Inhale 2 puffs into the lungs every 6 (six) hours as needed for wheezing., Disp: , Rfl:  .  aspirin 81 MG tablet, Take 81 mg by mouth daily., Disp: , Rfl:  .  atorvastatin (LIPITOR) 40 MG tablet, Take 20 mg by mouth daily. , Disp: , Rfl:  .  budesonide-formoterol (SYMBICORT) 160-4.5 MCG/ACT inhaler, Inhale 2 puffs into the lungs 2 (two) times daily. Until completed on 11/24 then will switch to wixela, Disp: , Rfl:  .  cyclobenzaprine (FLEXERIL) 10 MG tablet, Take 10 mg by mouth 2 (two) times daily as needed for muscle spasms. , Disp: , Rfl:  .  diltiazem (CARDIZEM CD) 120 MG 24 hr capsule, Take 120 mg by mouth at bedtime., Disp: , Rfl:  .  fluticasone (FLONASE) 50 MCG/ACT nasal spray, Place 2 sprays into the nose daily as needed for rhinitis. , Disp: , Rfl:  .  Fluticasone-Salmeterol (WIXELA INHUB) 250-50 MCG/DOSE AEPB, Inhale 1 puff into the lungs 2 (two) times daily. Per the [facility] PCP - [place] Patient will complete Symbicort until bottle empty then will switch to wixela on 09/23/20 (Patient not taking: Reported on 09/17/2020), Disp: , Rfl:  .  omeprazole (PRILOSEC) 20 MG capsule, Take 40 mg by mouth 2 (two) times daily. Take 2 capsules in the morning and 2 capsules in the evening, Disp: , Rfl:  .  OXYGEN, Inhale 2 L into the lungs as needed (During activity)., Disp: , Rfl:  .   potassium chloride 20 MEQ/15ML (10%) SOLN, Take 20 mEq by mouth 2 (two) times daily. , Disp: , Rfl:  .  SPIRIVA HANDIHALER 18 MCG inhalation capsule, Place 1 capsule into inhaler and inhale daily., Disp: , Rfl:  .  triamterene-hydrochlorothiazide (MAXZIDE-25) 37.5-25 MG per tablet, Take 1 tablet by mouth daily. Reported on 01/24/2016, Disp: , Rfl:  .  valsartan (DIOVAN) 40 MG tablet, TAKE 1 TABLET BY MOUTH EVERY DAY, Disp: 90 tablet, Rfl: 3  Past Medical History: Past Medical History:  Diagnosis Date  . Asthma   . Chest pain   . Community acquired pneumonia 11/12/2011  . COPD (chronic obstructive pulmonary disease) ([place])   . COPD, severe   . Coronary artery disease   . Coronary artery disease involving native coronary artery of native heart without angina pectoris 05/16/2017  . Dyslipidemia   . GERD (gastroesophageal reflux disease)   . Hearing loss of both ears 09-11-13   bilateral hearing aids used  . Hyperlipidemia   . Hypertension   . Ischemic heart disease   . left Inguinal hernia 02/01/2012   Repaired 05/01/12   . MI (myocardial infarction) ([place])   . Mixed hyperlipidemia 05/16/2017  . Papilloma of larynx   . PONV (postoperative nausea and vomiting)   . Shortness of breath 11/11/2011  . SOB (shortness of breath) on exertion     Tobacco Use: Social History   Tobacco Use  Smoking Status Former Smoker  . Quit date: 10/30/1990  . Years since quitting: 30.0  Smokeless Tobacco Never Used    Labs: Recent Review Flowsheet Data    Labs for ITP Cardiac and Pulmonary Rehab Latest Ref Rng & Units 07/22/2014 01/21/2015 08/11/2015 04/19/2016 08/29/2019   Cholestrol 100 - 199 mg/dL 161 159 165 - 164   LDLCALC 0 - 99 mg/dL 87 79 89 - 85   HDL >39 mg/dL 50.10 49.10 65 - 57   Trlycerides 0 - 149 mg/dL 119.0 153.0(H) 56 - 126   Hemoglobin A1c <5.7 % - - - - -   TCO2 0 - 100 mmol/L - - - 27 -      Capillary Blood Glucose: Lab Results  Component Value Date   GLUCAP 116 (H) 02/07/2019    GLUCAP 146 (H) 11/12/2011     Pulmonary Assessment Scores:  Pulmonary Assessment Scores    Row Name 09/17/20 1127         ADL UCSD   SOB Score total 67  Pre           CAT Score   CAT Score Pre 26           mMRC Score   mMRC Score 4           UCSD: Self-administered rating of dyspnea associated with activities of daily living (ADLs) 6-point scale (0 = "not at all" to 5 = "maximal or unable to do because of breathlessness")  Scoring Scores range from 0 to 120.  Minimally important difference is 5 units  CAT: CAT can identify the health impairment of COPD patients and is better correlated with disease progression.  CAT has a scoring range of zero to 40. The CAT score is classified into four groups of low (less than 10), medium (10 - 20), high (21-30) and very high (31-40) based on the impact level of disease on health status. A CAT score over 10 suggests significant symptoms.  A worsening CAT score could be explained by an exacerbation, poor medication adherence, poor inhaler technique, or progression of COPD or comorbid conditions.  CAT MCID is 2 points  mMRC: mMRC (Modified Medical Research Council) Dyspnea Scale is used to assess the degree of baseline functional disability in patients of respiratory disease due to dyspnea. No minimal important difference is established. A decrease in score of 1 point or greater is considered a positive change.   Pulmonary Function Assessment:   Exercise Target Goals: Exercise Program Goal: Individual exercise prescription set using results from initial 6 min walk test and THRR while considering  patient's activity barriers and safety.   Exercise Prescription Goal: Initial exercise prescription builds to 30-45 minutes a day of aerobic activity, 2-3 days per week.  Home exercise guidelines will be given to patient during program as part of exercise prescription that the participant will acknowledge.  Activity Barriers & Risk  Stratification:  Activity Barriers & Cardiac Risk Stratification - 09/17/20 1350      Activity Barriers & Cardiac Risk Stratification   Comments tremors           6 Minute Walk:  6 Minute Walk    Row Name 09/17/20 1045         6 Minute Walk   Phase Initial     Distance 1000 feet     Walk Time 6 minutes     # of Rest Breaks 0     MPH 1.9     METS 3  RPE 14     Perceived Dyspnea  3     VO2 Peak 10.5     Symptoms Yes (comment)     Comments Bilateral calf pain 5/10. Left buttock pain 7/10, SOB RPD = 3     Resting HR 100 bpm     Resting BP 130/60     Resting Oxygen Saturation  100 %     Exercise Oxygen Saturation  during 6 min walk 97 %     Max Ex. HR 123 bpm     Max Ex. BP 172/74     2 Minute Post BP 150/70           Interval HR   1 Minute HR 118     2 Minute HR 120     3 Minute HR 120     4 Minute HR 120     5 Minute HR 122     6 Minute HR 123     2 Minute Post HR 110           Interval Oxygen   Baseline Oxygen Saturation % 100 %     1 Minute Oxygen Saturation % 99 %     1 Minute Liters of Oxygen 2 L     2 Minute Oxygen Saturation % 97 %     2 Minute Liters of Oxygen 2 L     3 Minute Oxygen Saturation % 97 %     3 Minute Liters of Oxygen 2 L     4 Minute Oxygen Saturation % 97 %     4 Minute Liters of Oxygen 2 L     5 Minute Oxygen Saturation % 97 %     5 Minute Liters of Oxygen 2 L     6 Minute Oxygen Saturation % 97 %     6 Minute Liters of Oxygen 2 L     2 Minute Post Oxygen Saturation % 99 %     2 Minute Post Liters of Oxygen 2 L            Oxygen Initial Assessment:  Oxygen Initial Assessment - 09/17/20 0942      Home Oxygen   Home Oxygen Device Home Concentrator;Portable Concentrator   [employer]   Sleep Oxygen Prescription Continuous    Liters per minute 2    Home Exercise Oxygen Prescription Continuous   uses pulse 2L   Liters per minute 2    Home Resting Oxygen Prescription None    Compliance with Home Oxygen Use Yes      Initial 6  min Walk   Oxygen Used Continuous    Liters per minute 2      Program Oxygen Prescription   Program Oxygen Prescription Continuous    Liters per minute 2      Intervention   Short Term Goals To learn and exhibit compliance with exercise, home and travel O2 prescription;To learn and understand importance of monitoring SPO2 with pulse oximeter and demonstrate accurate use of the pulse oximeter.;To learn and understand importance of maintaining oxygen saturations>88%;To learn and demonstrate proper pursed lip breathing techniques or other breathing techniques.;To learn and demonstrate proper use of respiratory medications    Long  Term Goals Exhibits compliance with exercise, home and travel O2 prescription;Verbalizes importance of monitoring SPO2 with pulse oximeter and return demonstration;Maintenance of O2 saturations>88%;Exhibits proper breathing techniques, such as pursed lip breathing or other method taught during program session;Compliance with respiratory medication;Demonstrates proper use of MDI's  Oxygen Re-Evaluation:  Oxygen Re-Evaluation    Row Name 10/12/20 0730 11/09/20 0747           Program Oxygen Prescription   Program Oxygen Prescription Continuous Continuous      Liters per minute 2 2             Home Oxygen   Home Oxygen Device Home Concentrator;Portable Concentrator Home Concentrator;Portable Concentrator      Sleep Oxygen Prescription Continuous Continuous      Liters per minute 2 2      Home Exercise Oxygen Prescription Continuous Continuous      Liters per minute 2 2      Home Resting Oxygen Prescription None None      Compliance with Home Oxygen Use Yes Yes             Goals/Expected Outcomes   Short Term Goals To learn and exhibit compliance with exercise, home and travel O2 prescription;To learn and understand importance of monitoring SPO2 with pulse oximeter and demonstrate accurate use of the pulse oximeter.;To learn and understand  importance of maintaining oxygen saturations>88%;To learn and demonstrate proper pursed lip breathing techniques or other breathing techniques.;To learn and demonstrate proper use of respiratory medications To learn and exhibit compliance with exercise, home and travel O2 prescription;To learn and understand importance of monitoring SPO2 with pulse oximeter and demonstrate accurate use of the pulse oximeter.;To learn and understand importance of maintaining oxygen saturations>88%;To learn and demonstrate proper pursed lip breathing techniques or other breathing techniques.;To learn and demonstrate proper use of respiratory medications      Long  Term Goals Exhibits compliance with exercise, home and travel O2 prescription;Verbalizes importance of monitoring SPO2 with pulse oximeter and return demonstration;Maintenance of O2 saturations>88%;Exhibits proper breathing techniques, such as pursed lip breathing or other method taught during program session;Compliance with respiratory medication;Demonstrates proper use of MDI's Exhibits compliance with exercise, home and travel O2 prescription;Verbalizes importance of monitoring SPO2 with pulse oximeter and return demonstration;Maintenance of O2 saturations>88%;Exhibits proper breathing techniques, such as pursed lip breathing or other method taught during program session;Compliance with respiratory medication;Demonstrates proper use of MDI's      Goals/Expected Outcomes Compliance and understanding of pursed lip breathing and oxygen saturation Compliance and understanding of pursed lip breathing and oxygen saturation             Oxygen Discharge (Final Oxygen Re-Evaluation):  Oxygen Re-Evaluation - 11/09/20 0747      Program Oxygen Prescription   Program Oxygen Prescription Continuous    Liters per minute 2      Home Oxygen   Home Oxygen Device Home Concentrator;Portable Concentrator    Sleep Oxygen Prescription Continuous    Liters per minute 2     Home Exercise Oxygen Prescription Continuous    Liters per minute 2    Home Resting Oxygen Prescription None    Compliance with Home Oxygen Use Yes      Goals/Expected Outcomes   Short Term Goals To learn and exhibit compliance with exercise, home and travel O2 prescription;To learn and understand importance of monitoring SPO2 with pulse oximeter and demonstrate accurate use of the pulse oximeter.;To learn and understand importance of maintaining oxygen saturations>88%;To learn and demonstrate proper pursed lip breathing techniques or other breathing techniques.;To learn and demonstrate proper use of respiratory medications    Long  Term Goals Exhibits compliance with exercise, home and travel O2 prescription;Verbalizes importance of monitoring SPO2 with pulse oximeter and return demonstration;Maintenance of O2 saturations>88%;Exhibits proper breathing  techniques, such as pursed lip breathing or other method taught during program session;Compliance with respiratory medication;Demonstrates proper use of MDI's    Goals/Expected Outcomes Compliance and understanding of pursed lip breathing and oxygen saturation           Initial Exercise Prescription:  Initial Exercise Prescription - 09/17/20 1300      Date of Initial Exercise RX and Referring Provider   Date 09/17/20    Referring Provider [name], MD    Expected Discharge Date 11/18/20      Oxygen   Oxygen Continuous    Liters 2      NuStep   Level 2    SPM 75    Minutes 25    METs 2      Prescription Details   Frequency (times per week) 2    Duration Progress to 30 minutes of continuous aerobic without signs/symptoms of physical distress      Intensity   THRR 40-80% of Max Heartrate 58-116    Ratings of Perceived Exertion 11-13    Perceived Dyspnea 0-4      Progression   Progression Continue progressive overload as per policy without signs/symptoms or physical distress.      Resistance Training   Training  Prescription Yes    Weight blue bands    Reps 10-15           Perform Capillary Blood Glucose checks as needed.  Exercise Prescription Changes:  Exercise Prescription Changes    Row Name 09/28/20 1100 10/12/20 1100 10/26/20 1200         Response to Exercise   Blood Pressure (Admit) 104/50 104/56 122/50     Blood Pressure (Exercise) 144/54 130/64 140/54     Blood Pressure (Exit) 96/54 100/56 102/50     Heart Rate (Admit) 95 bpm 92 bpm 95 bpm     Heart Rate (Exercise) 95 bpm 110 bpm 105 bpm     Heart Rate (Exit) 91 bpm 95 bpm 102 bpm     Oxygen Saturation (Admit) 99 % 99 % 99 %     Oxygen Saturation (Exercise) 100 % 99 % 98 %     Oxygen Saturation (Exit) 100 % 100 % 98 %     Rating of Perceived Exertion (Exercise) 11 14 14      Perceived Dyspnea (Exercise) 1 3 3      Duration Progress to 45 minutes of aerobic exercise without signs/symptoms of physical distress Continue with 30 min of aerobic exercise without signs/symptoms of physical distress. Progress to 45 minutes of aerobic exercise without signs/symptoms of physical distress     Intensity Other (comment)  4-80% of HRR THRR unchanged THRR unchanged           Progression   Progression Continue to progress workloads to maintain intensity without signs/symptoms of physical distress. Continue to progress workloads to maintain intensity without signs/symptoms of physical distress. Continue to progress workloads to maintain intensity without signs/symptoms of physical distress.           Resistance Training   Training Prescription Yes Yes Yes     Weight orange bands orange bands Orange bands     Reps 10-15 10-15 10-15     Time 10 Minutes 10 Minutes 10 Minutes           Oxygen   Oxygen Continuous Continuous Continuous     Liters 2 2 2            NuStep   Level 2 3  4     SPM 80 80 80     Minutes 30 15 15      METs 1.8 2 2.4           Track   Laps - 12 13     Minutes - 15 15     METs - - 2.5            Exercise  Comments:  Exercise Comments    Row Name 09/21/20 1154           Exercise Comments Pt completed first day of exercise and tolerated well with no complaints or concerns. He remembered most of the band exercises and stretches on his own from the last time he participated in rehab. He was independent with most of the exercises.              Exercise Goals and Review:  Exercise Goals    Row Name 09/17/20 0941             Exercise Goals   Increase Physical Activity Yes       Expected Outcomes Long Term: Exercising regularly at least 3-5 days a week.;Long Term: Add in home exercise to make exercise part of routine and to increase amount of physical activity.;Short Term: Attend rehab on a regular basis to increase amount of physical activity.       Increase Strength and Stamina Yes       Expected Outcomes Short Term: Increase workloads from initial exercise prescription for resistance, speed, and METs.;Short Term: Perform resistance training exercises routinely during rehab and add in resistance training at home;Long Term: Improve cardiorespiratory fitness, muscular endurance and strength as measured by increased METs and functional capacity (6MWT)       Able to understand and use rate of perceived exertion (RPE) scale Yes       Intervention Provide education and explanation on how to use RPE scale       Expected Outcomes Short Term: Able to use RPE daily in rehab to express subjective intensity level;Long Term:  Able to use RPE to guide intensity level when exercising independently       Able to understand and use Dyspnea scale Yes       Intervention Provide education and explanation on how to use Dyspnea scale       Expected Outcomes Short Term: Able to use Dyspnea scale daily in rehab to express subjective sense of shortness of breath during exertion;Long Term: Able to use Dyspnea scale to guide intensity level when exercising independently       Knowledge and understanding of Target  Heart Rate Range (THRR) Yes       Intervention Provide education and explanation of THRR including how the numbers were predicted and where they are located for reference       Expected Outcomes Short Term: Able to state/look up THRR;Short Term: Able to use daily as guideline for intensity in rehab;Long Term: Able to use THRR to govern intensity when exercising independently       Understanding of Exercise Prescription Yes       Intervention Provide education, explanation, and written materials on patient's individual exercise prescription       Expected Outcomes Short Term: Able to explain program exercise prescription;Long Term: Able to explain home exercise prescription to exercise independently              Exercise Goals Re-Evaluation :  Exercise Goals Re-Evaluation    Row Name 10/12/20  [id number] 11/09/20 0747           Exercise Goal Re-Evaluation   Exercise Goals Review Increase Physical Activity;Increase Strength and Stamina;Able to understand and use rate of perceived exertion (RPE) scale;Able to understand and use Dyspnea scale;Knowledge and understanding of Target Heart Rate Range (THRR);Understanding of Exercise Prescription Increase Physical Activity;Increase Strength and Stamina;Able to understand and use rate of perceived exertion (RPE) scale;Able to understand and use Dyspnea scale;Knowledge and understanding of Target Heart Rate Range (THRR);Understanding of Exercise Prescription      Comments Caige has completed 4 exercise sessions and has tolerated well so far. In that short time he has made progressions with workload increases and MET level increases. He is exercising at 2.1 METS on the Nustep. He had been through the program before and on his first session back he was independent with all resistance band exercises. Duayne is doing well so far. Will continue to monitor and progress as he is able. Aashish has completed 12 exercise sessions and has been steady with making workload increases  and MET increases. He is now walking the track for 15 minutes after the Nustep and has been tolerating that well so far. He is exercising at 2.4 METS on the Nustep and 2.39 METS walking the track. Will continue to monitor and progress as he is able.      Expected Outcomes Through exercise at rehab and home the patient will decrease shortness of breath with daily activities and feel confident in carrying out an exercise regimn at home. Through exercise at rehab and home the patient will decrease shortness of breath with daily activities and feel confident in carrying out an exercise regimn at home.             Discharge Exercise Prescription (Final Exercise Prescription Changes):  Exercise Prescription Changes - 10/26/20 1200      Response to Exercise   Blood Pressure (Admit) 122/50    Blood Pressure (Exercise) 140/54    Blood Pressure (Exit) 102/50    Heart Rate (Admit) 95 bpm    Heart Rate (Exercise) 105 bpm    Heart Rate (Exit) 102 bpm    Oxygen Saturation (Admit) 99 %    Oxygen Saturation (Exercise) 98 %    Oxygen Saturation (Exit) 98 %    Rating of Perceived Exertion (Exercise) 14    Perceived Dyspnea (Exercise) 3    Duration Progress to 45 minutes of aerobic exercise without signs/symptoms of physical distress    Intensity THRR unchanged      Progression   Progression Continue to progress workloads to maintain intensity without signs/symptoms of physical distress.      Resistance Training   Training Prescription Yes    Weight Orange bands    Reps 10-15    Time 10 Minutes      Oxygen   Oxygen Continuous    Liters 2      NuStep   Level 4    SPM 80    Minutes 15    METs 2.4      Track   Laps 13    Minutes 15    METs 2.5           Nutrition:  Target Goals: Understanding of nutrition guidelines, daily intake of sodium <1519m, cholesterol <2058m calories 30% from fat and 7% or less from saturated fats, daily to have 5 or more servings of fruits and  vegetables.  Biometrics:  Pre Biometrics - 09/17/20 1059  Pre Biometrics   Grip Strength 32 kg            Nutrition Therapy Plan and Nutrition Goals:  Nutrition Therapy & Goals - 09/28/20 1449      Nutrition Therapy   Diet High Calorie, High Protein      Personal Nutrition Goals   Nutrition Goal Pt to identify food quantities necessary to achieve weight gain of 0.25 lbs per week while in cardiac rehab    Personal Goal #2 Eat mini meals 4-5 times per day    Personal Goal #3 Choose nutrient dense foods that are high in calories      Intervention Plan   Intervention Prescribe, educate and counsel regarding individualized specific dietary modifications aiming towards targeted core components such as weight, hypertension, lipid management, diabetes, heart failure and other comorbidities.    Expected Outcomes Long Term Goal: Adherence to prescribed nutrition plan.;Short Term Goal: A plan has been developed with personal nutrition goals set during dietitian appointment.           Nutrition Assessments:  Nutrition Assessments - 09/28/20 1451      Rate Your Plate Scores   Pre Score 45          MEDIFICTS Score Key:  ?70 Need to make dietary changes   40-70 Heart Healthy Diet  ? 40 Therapeutic Level Cholesterol Diet   Picture Your Plate Scores:  <96 Unhealthy dietary pattern with much room for improvement.  41-50 Dietary pattern unlikely to meet recommendations for good health and room for improvement.  51-60 More healthful dietary pattern, with some room for improvement.   >60 Healthy dietary pattern, although there may be some specific behaviors that could be improved.    Nutrition Goals Re-Evaluation:  Nutrition Goals Re-Evaluation    [place] Name 09/28/20 1450 11/08/20 0746           Goals   Current Weight 100 lb (45.4 kg) 106 lb 0.7 oz (48.1 kg)      Nutrition Goal Pt to identify food quantities necessary to achieve weight gain of 0.25 lbs per week  while in cardiac rehab Pt to identify food quantities necessary to achieve weight gain of 0.25 lbs per week while in cardiac rehab      Comment - Weight gain 2.86 lbs since start of pulmonary rehab.             Personal Goal #2 Re-Evaluation   Personal Goal #2 Eat mini meals 4-5 times per day Eat mini meals 4-5 times per day             Personal Goal #3 Re-Evaluation   Personal Goal #3 Choose nutrient dense foods that are high in calories Choose nutrient dense foods that are high in calories             Nutrition Goals Discharge (Final Nutrition Goals Re-Evaluation):  Nutrition Goals Re-Evaluation - 11/08/20 0746      Goals   Current Weight 106 lb 0.7 oz (48.1 kg)    Nutrition Goal Pt to identify food quantities necessary to achieve weight gain of 0.25 lbs per week while in cardiac rehab    Comment Weight gain 2.86 lbs since start of pulmonary rehab.      Personal Goal #2 Re-Evaluation   Personal Goal #2 Eat mini meals 4-5 times per day      Personal Goal #3 Re-Evaluation   Personal Goal #3 Choose nutrient dense foods that are high in calories  Psychosocial: Target Goals: Acknowledge presence or absence of significant depression and/or stress, maximize coping skills, provide positive support system. Participant is able to verbalize types and ability to use techniques and skills needed for reducing stress and depression.  Initial Review & Psychosocial Screening:  Initial Psych Review & Screening - 09/17/20 0946      Initial Review   Current issues with Current Stress Concerns    Source of Stress Concerns Chronic Illness;Family    Comments wife who moved in with her mother as the health declined this caused Yaden to be home alone to cook and clean the home.  Family dynamics with brother regarding care of the mother.   Wife's mother recently passed away      [place]? Yes      Barriers   Psychosocial barriers to participate in program  The patient should benefit from training in stress management and relaxation.      Screening Interventions   Interventions Encouraged to exercise;Provide feedback about the scores to participant    Expected Outcomes Long Term goal: The participant improves quality of Life and PHQ9 Scores as seen by post scores and/or verbalization of changes;Short Term goal: Identification and review with participant of any Quality of Life or Depression concerns found by scoring the questionnaire.;Long Term Goal: Stressors or current issues are controlled or eliminated.;Short Term goal: Utilizing psychosocial counselor, staff and physician to assist with identification of specific Stressors or current issues interfering with healing process. Setting desired goal for each stressor or current issue identified.           Quality of Life Scores:  Scores of 19 and below usually indicate a poorer quality of life in these areas.  A difference of  2-3 points is a clinically meaningful difference.  A difference of 2-3 points in the total score of the Quality of Life Index has been associated with significant improvement in overall quality of life, self-image, physical symptoms, and general health in studies assessing change in quality of life.  PHQ-9: Recent Review Flowsheet Data    Depression screen [facility] 2/9 09/17/2020 09/17/2020 05/18/2016 01/24/2016   Decreased Interest 0 0 0 0   Down, Depressed, Hopeless 0 0 0 0   PHQ - 2 Score 0 0 0 0   Altered sleeping 0 - - -   Tired, decreased energy 2 - - -   Change in appetite 2 - - -   Feeling bad or failure about yourself  0 - - -   Trouble concentrating 0 - - -   Moving slowly or fidgety/restless 0 - - -   Suicidal thoughts 0 - - -   PHQ-9 Score 4 - - -   Difficult doing work/chores Not difficult at all - - -     Interpretation of Total Score  Total Score Depression Severity:  1-4 = Minimal depression, 5-9 = Mild depression, 10-14 = Moderate depression, 15-19 =  Moderately severe depression, 20-27 = Severe depression   Psychosocial Evaluation and Intervention:   Psychosocial Re-Evaluation:  Psychosocial Re-Evaluation    Row Name 09/20/20 1149 10/05/20 0925 11/08/20 1317         Psychosocial Re-Evaluation   Current issues with Current Stress Concerns Current Stress Concerns Current Stress Concerns     Comments No change since orientation/walk test which was 3 days ago. His wife is able to spend more time with Madyx since her mother died.  Mohd. is able to handle his  chronic illness in a positive manner. Travonne is handling his stress well, his emphysema limits his ability to perform strenuous activities due to shortness of breath     Expected Outcomes For Alvy to handle stress in healthy ways. For Gershon to continue to handle stress in positive ways despite his inability to perform activites that are strenuous. For Joangel to continue to deal with his stressors in healthy ways.     Interventions Stress management education;Encouraged to attend Pulmonary Rehabilitation for the exercise Encouraged to attend Pulmonary Rehabilitation for the exercise;Relaxation education;Stress management education Stress management education;Encouraged to attend Pulmonary Rehabilitation for the exercise     Continue Psychosocial Services  No Follow up required Follow up required by staff No Follow up required           Initial Review   Source of Stress Concerns Chronic Illness;Unable to participate in former interests or hobbies;Unable to perform yard/household activities Chronic Illness;Unable to participate in former interests or hobbies;Unable to perform yard/household activities Chronic Illness;Unable to participate in former interests or hobbies;Unable to perform yard/household activities            Psychosocial Discharge (Final Psychosocial Re-Evaluation):  Psychosocial Re-Evaluation - 11/08/20 1317      Psychosocial Re-Evaluation   Current issues with Current Stress  Concerns    Comments Yariel is handling his stress well, 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    Interventions Stress management education;Encouraged to attend Pulmonary Rehabilitation for the exercise    Continue Psychosocial Services  No Follow up required      Initial Review   Source of Stress Concerns Chronic Illness;Unable to participate in former interests or hobbies;Unable to perform yard/household activities           Education: Education Goals: Education classes will be provided on a weekly basis, covering required topics. Participant will state understanding/return demonstration of topics presented.  Learning Barriers/Preferences:  Learning Barriers/Preferences - 09/17/20 1350      Learning Barriers/Preferences   Learning Barriers Hearing;Sight           Education Topics: Risk Factor Reduction:  -Group instruction that is supported by a PowerPoint presentation. Instructor discusses the definition of a risk factor, different risk factors for pulmonary disease, and how the heart and lungs work together.   Flowsheet Row PULMONARY REHAB OTHER RESPIRATORY from 11/04/2020 in [place]  Date 11/04/20  Educator Handout      Nutrition for Pulmonary Patient:  -Group instruction provided by PowerPoint slides, verbal discussion, and written materials to support subject matter. The instructor gives an explanation and review of healthy diet recommendations, which includes a discussion on weight management, recommendations for fruit and vegetable consumption, as well as protein, fluid, caffeine, fiber, sodium, sugar, and alcohol. Tips for eating when patients are short of breath are discussed. Flowsheet Row PULMONARY REHAB OTHER RESPIRATORY from 11/04/2020 in [place]  Date 09/30/20   Educator Handout  [[place]]      Pursed Lip Breathing:  -Group instruction that is supported by demonstration and informational handouts. Instructor discusses the benefits of pursed lip and diaphragmatic breathing and detailed demonstration on how to preform both.   Flowsheet Row PULMONARY REHAB OTHER RESPIRATORY from 11/04/2020 in [place]  Date 02/10/16  Educator RT      Oxygen  Safety:  -Group instruction provided by PowerPoint, verbal discussion, and written material to support subject matter. There is an overview of "What is Oxygen" and "Why do we need it".  Instructor also reviews how to create a safe environment for oxygen use, the importance of using oxygen as prescribed, and the risks of noncompliance. There is a brief discussion on traveling with oxygen and resources the patient may utilize. Flowsheet Row PULMONARY REHAB OTHER RESPIRATORY from 11/04/2020 in [place]  Date 02/03/16  Educator Trish Fountain      Oxygen Equipment:  -Group instruction provided by [facility] Staff utilizing handouts, written materials, and equipment demonstrations. Flowsheet Row PULMONARY REHAB OTHER RESPIRATORY from 11/04/2020 in [place]  Date 02/17/16  Educator Ace Gins rep      Signs and Symptoms:  -Group instruction provided by written material and verbal discussion to support subject matter. Warning signs and symptoms of infection, stroke, and heart attack are reviewed and when to call the physician/911 reinforced. Tips for preventing the spread of infection discussed.   Advanced Directives:  -Group instruction provided by verbal instruction and written material to support subject matter. Instructor reviews Advanced Directive laws and proper instruction for filling out document. Flowsheet Row PULMONARY REHAB OTHER RESPIRATORY from 11/04/2020 in [place]  Date  03/16/16  Educator Mikki Santee [place]      Pulmonary Video:  -Group video education that reviews the importance of medication and oxygen compliance, exercise, good nutrition, pulmonary hygiene, and pursed lip and diaphragmatic breathing for the pulmonary patient. Flowsheet Row PULMONARY REHAB OTHER RESPIRATORY from 11/04/2020 in [place]  Date 04/06/16      Exercise for the Pulmonary Patient:  -Group instruction that is supported by a PowerPoint presentation. Instructor discusses benefits of exercise, core components of exercise, frequency, duration, and intensity of an exercise routine, importance of utilizing pulse oximetry during exercise, safety while exercising, and options of places to exercise outside of rehab.   Flowsheet Row PULMONARY REHAB OTHER RESPIRATORY from 11/04/2020 in [place]  Date 05/04/16  Educator ep      Pulmonary Medications:  -Verbally interactive group education provided by instructor with focus on inhaled medications and proper administration. Flowsheet Row PULMONARY REHAB OTHER RESPIRATORY from 11/04/2020 in [place]  Date 02/10/16  Educator RT      Anatomy and Physiology of the Respiratory System and Intimacy:  -Group instruction provided by PowerPoint, verbal discussion, and written material to support subject matter. Instructor reviews respiratory cycle and anatomical components of the respiratory system and their functions. Instructor also reviews differences in obstructive and restrictive respiratory diseases with examples of each. Intimacy, Sex, and Sexuality differences are reviewed with a discussion on how relationships can change when diagnosed with pulmonary disease. Common sexual concerns are reviewed. Flowsheet Row PULMONARY REHAB OTHER RESPIRATORY from 11/04/2020 in [place]  Date 10/28/20  Educator handout      MD  DAY -A group question and answer session with a medical doctor that allows participants to ask questions that relate to their pulmonary disease state.   OTHER EDUCATION -Group or individual verbal, written, or video instructions that support the educational goals of the pulmonary rehab program.   Holiday Eating Survival Tips:  -Group instruction provided by PowerPoint slides, verbal discussion, and written materials to support subject matter. The instructor gives patients tips, tricks, and techniques  to help them not only survive but enjoy the holidays despite the onslaught of food that accompanies the holidays. Flowsheet Row PULMONARY REHAB OTHER RESPIRATORY from 11/04/2020 in [place]  Date 10/14/20  Educator --  [handout]      Knowledge Questionnaire Score:  Knowledge Questionnaire Score - 09/17/20 1137      Knowledge Questionnaire Score   Pre Score 12/13           Core Components/Risk Factors/Patient Goals at Admission:  Personal Goals and Risk Factors at Admission - 09/17/20 0957      Core Components/Risk Factors/Patient Goals on Admission    Weight Management Weight Gain;Yes    Admit Weight 102 lb (46.3 kg)    Goal Weight: Short Term 115 lb (52.2 kg)    Goal Weight: Long Term 125 lb (56.7 kg)    Improve shortness of breath with ADL's Yes    Intervention Provide education, individualized exercise plan and daily activity instruction to help decrease symptoms of SOB with activities of daily living.    Expected Outcomes Short Term: Improve cardiorespiratory fitness to achieve a reduction of symptoms when performing ADLs;Long Term: Be able to perform more ADLs without symptoms or delay the onset of symptoms    Hypertension Yes    Intervention Provide education on lifestyle modifcations including regular physical activity/exercise, weight management, moderate sodium restriction and increased consumption of fresh fruit, vegetables, and low fat  dairy, alcohol moderation, and smoking cessation.;Monitor prescription use compliance.    Expected Outcomes Short Term: Continued assessment and intervention until BP is < 140/101m HG in hypertensive participants. < 130/843mHG in hypertensive participants with diabetes, heart failure or chronic kidney disease.;Long Term: Maintenance of blood pressure at goal levels.    Lipids Yes    Intervention Provide education and support for participant on nutrition & aerobic/resistive exercise along with prescribed medications to achieve LDL <7077mHDL >77m83m  Stress Yes    Intervention Offer individual and/or small group education and counseling on adjustment to heart disease, stress management and health-related lifestyle change. Teach and support self-help strategies.    Expected Outcomes Short Term: Participant demonstrates changes in health-related behavior, relaxation and other stress management skills, ability to obtain effective social support, and compliance with psychotropic medications if prescribed.;Long Term: Emotional wellbeing is indicated by absence of clinically significant psychosocial distress or social isolation.           Core Components/Risk Factors/Patient Goals Review:   Goals and Risk Factor Review    Row Name 09/20/20 1151 10/05/20 0928 11/08/20 1325         Core Components/Risk Factors/Patient Goals Review   Personal Goals Review Develop more efficient breathing techniques such as purse lipped breathing and diaphragmatic breathing and practicing self-pacing with activity.;Increase knowledge of respiratory medications and ability to use respiratory devices properly.;Improve shortness of breath with ADL's Increase knowledge of respiratory medications and ability to use respiratory devices properly.;Improve shortness of breath with ADL's;Develop more efficient breathing techniques such as purse lipped breathing and diaphragmatic breathing and practicing self-pacing with activity.  Develop more efficient breathing techniques such as purse lipped breathing and diaphragmatic breathing and practicing self-pacing with activity.;Increase knowledge of respiratory medications and ability to use respiratory devices properly.;Improve shortness of breath with ADL's     Review Has not started exercising in pulmonary rehab yet , but will in the next day. NeilPearlyt started the program, has attended 3 exercise sessions, was in pulmonary rehab in 2017 and  still remembers the thera-band exercises and performs them at home occassionally. He has gained approximately 5 pounds following the suggestions of our dietician.  He is attendance is regular and works hard while exercising. His workloads continue to increase.     Expected Outcomes For patient to make progression towards goals in the next 30 days. To see and increase in strength, stamina, and endurance in the next full 30 days. Continue to increase workloads as tolerated.            Core Components/Risk Factors/Patient Goals at Discharge (Final Review):   Goals and Risk Factor Review - 11/08/20 1325      Core Components/Risk Factors/Patient Goals Review   Personal Goals Review Develop more efficient breathing techniques such as purse lipped breathing and diaphragmatic breathing and practicing self-pacing with activity.;Increase knowledge of respiratory medications and ability to use respiratory devices properly.;Improve shortness of breath with ADL's    Review He has gained approximately 5 pounds following the suggestions of our dietician.  He is attendance is regular and works hard while exercising. His workloads continue to increase.    Expected Outcomes Continue to increase workloads as tolerated.           ITP Comments:   Comments: ITP REVIEW Pt is making expected progress toward pulmonary rehab goals after completing 12 sessions. Recommend continued exercise, life style modification, education, and utilization of breathing  techniques to increase stamina and strength and decrease shortness of breath with exertion.

## 2020-11-11 ENCOUNTER — Other Ambulatory Visit: Payer: Self-pay

## 2020-11-11 ENCOUNTER — Encounter (HOSPITAL_COMMUNITY)
Admission: RE | Admit: 2020-11-11 | Discharge: 2020-11-11 | Disposition: A | Discharge status: Home / Self Care | Payer: Medicare Other | Source: Ambulatory Visit | Attending: Cardiology | Admitting: Cardiology

## 2020-11-11 DIAGNOSIS — J438 Other emphysema: Secondary | ICD-10-CM

## 2020-11-11 DIAGNOSIS — J449 Chronic obstructive pulmonary disease, unspecified: Secondary | ICD-10-CM | POA: Diagnosis not present

## 2020-11-11 NOTE — Progress Notes (Signed)
Daily Session Note  Patient Details  Name: William Lee MRN: [id number] Date of Birth: [birth year] Referring Provider:   April Lee Pulmonary Rehab Walk Test from 09/17/2020 in [place]  Referring Provider William Him, MD      Encounter Date: 11/11/2020  Check In:  Session Check In - 11/11/20 1102      Check-In   Supervising physician immediately available to respond to emergencies [facility] immediately available    Physician(s) William Lee    Location [facility]    Staff Present William Poles, RN, William Laud, MS, ACSM-CEP, Exercise Physiologist    Virtual Visit No    Medication changes reported     No    Fall or balance concerns reported    No    Tobacco Cessation No Change    Warm-up and Cool-down Performed on first and last piece of equipment    Resistance Training Performed Yes    VAD Patient? No    PAD/SET Patient? No      Pain Assessment   Currently in Pain? No/denies    Multiple Pain Sites No           Capillary Blood Glucose: No results found for this or any previous visit (from the past 24 hour(s)).    Social History   Tobacco Use  Smoking Status Former Smoker  . Quit date: 10/30/1990  . Years since quitting: 30.0  Smokeless Tobacco Never Used    Goals Met:  Proper associated with RPD/PD & O2 Sat Independence with exercise equipment Exercise tolerated well No report of cardiac concerns or symptoms Strength training completed today  Goals Unmet:  Not Applicable  Comments: Service time is from 1040 to 1158    Dr. Fransico Lee is Medical Director for Cardiac Rehab at [facility].

## 2020-11-16 ENCOUNTER — Encounter (HOSPITAL_COMMUNITY): Payer: Medicare Other

## 2020-11-18 ENCOUNTER — Other Ambulatory Visit: Payer: Self-pay

## 2020-11-18 ENCOUNTER — Other Ambulatory Visit: Payer: Self-pay | Admitting: Family Medicine

## 2020-11-18 ENCOUNTER — Encounter (HOSPITAL_COMMUNITY)
Admission: RE | Admit: 2020-11-18 | Discharge: 2020-11-18 | Disposition: A | Discharge status: Home / Self Care | Payer: Medicare Other | Source: Ambulatory Visit | Attending: Cardiology | Admitting: Cardiology

## 2020-11-18 DIAGNOSIS — J438 Other emphysema: Secondary | ICD-10-CM

## 2020-11-18 DIAGNOSIS — G8929 Other chronic pain: Secondary | ICD-10-CM

## 2020-11-18 DIAGNOSIS — J449 Chronic obstructive pulmonary disease, unspecified: Secondary | ICD-10-CM | POA: Diagnosis not present

## 2020-11-18 NOTE — Progress Notes (Signed)
Daily Session Note  Patient Details  Name: William Lee MRN: [id number] Date of Birth: [birth year] Referring Provider:   April Manson Pulmonary Rehab Walk Test from 09/17/2020 in [place]  Referring Provider [name], MD      Encounter Date: 11/18/2020  Check In:  Session Check In - 11/18/20 1051      Check-In   Supervising physician immediately available to respond to emergencies [facility] immediately available    Physician(s) Dr. Florencia Reasons    Location [facility]    Staff Present Trish Fountain, RN, BSN;Other;Jessica Hassell Done, MS, ACSM-CEP, Exercise Physiologist    Virtual Visit No    Medication changes reported     No    Fall or balance concerns reported    No    Tobacco Cessation No Change    Warm-up and Cool-down Performed on first and last piece of equipment    Resistance Training Performed Yes    VAD Patient? No    PAD/SET Patient? No      Pain Assessment   Currently in Pain? No/denies    Multiple Pain Sites No           Capillary Blood Glucose: No results found for this or any previous visit (from the past 24 hour(s)).    Social History   Tobacco Use  Smoking Status Former Smoker  . Quit date: 10/30/1990  . Years since quitting: 30.0  Smokeless Tobacco Never Used    Goals Met:  Proper associated with RPD/PD & O2 Sat Independence with exercise equipment Exercise tolerated well Strength training completed today  Goals Unmet:  Not Applicable  Comments: Service time is from 1030 to 1156   Dr. [name] is Medical Director for Cardiac Rehab at [facility].

## 2020-11-23 ENCOUNTER — Other Ambulatory Visit: Payer: Self-pay

## 2020-11-23 ENCOUNTER — Encounter (HOSPITAL_COMMUNITY)
Admission: RE | Admit: 2020-11-23 | Discharge: 2020-11-23 | Disposition: A | Discharge status: Home / Self Care | Payer: Medicare Other | Source: Ambulatory Visit | Attending: Cardiology | Admitting: Cardiology

## 2020-11-23 VITALS — Wt 106.9 lb

## 2020-11-23 DIAGNOSIS — J438 Other emphysema: Secondary | ICD-10-CM

## 2020-11-23 DIAGNOSIS — J449 Chronic obstructive pulmonary disease, unspecified: Secondary | ICD-10-CM | POA: Diagnosis not present

## 2020-11-23 NOTE — Progress Notes (Signed)
Daily Session Note  Patient Details  Name: William Lee MRN: [id number] Date of Birth: 1946-05-15 Referring Provider:   April Manson Pulmonary Rehab Walk Test from 09/17/2020 in [place]  Referring Provider [name], MD      Encounter Date: 11/23/2020  Check In:  Session Check In - 11/23/20 [place] physician immediately available to respond to emergencies [facility] immediately available    Physician(s) Dr. Antonieta Pert    Location [facility]    Staff Present Rosebud Poles, RN, Isaac Laud, MS, ACSM-CEP, Exercise Physiologist    Virtual Visit No    Medication changes reported     No    Fall or balance concerns reported    No    Tobacco Cessation No Change    Warm-up and Cool-down Performed on first and last piece of equipment    Resistance Training Performed Yes    VAD Patient? No    PAD/SET Patient? No      Pain Assessment   Currently in Pain? No/denies    Pain Score 0-No pain    Multiple Pain Sites No           Capillary Blood Glucose: No results found for this or any previous visit (from the past 24 hour(s)).   Exercise Prescription Changes - 11/23/20 1300      Response to Exercise   Blood Pressure (Admit) 108/58    Blood Pressure (Exercise) 154/60    Blood Pressure (Exit) 100/50    Heart Rate (Admit) 89 bpm    Heart Rate (Exercise) 116 bpm    Heart Rate (Exit) 99 bpm    Oxygen Saturation (Admit) 99 %    Oxygen Saturation (Exercise) 97 %    Oxygen Saturation (Exit) 100 %    Rating of Perceived Exertion (Exercise) 14    Perceived Dyspnea (Exercise) 3    Duration Continue with 30 min of aerobic exercise without signs/symptoms of physical distress.    Intensity THRR unchanged      Progression   Progression Continue to progress workloads to maintain intensity without signs/symptoms of physical distress.      Resistance Training   Training Prescription Yes    Reps  10-15    Time 10 Minutes      Oxygen   Oxygen Continuous    Liters 2      NuStep   Level 5    SPM 80    Minutes 15    METs 2.6      Track   Laps 12    Minutes 15           Social History   Tobacco Use  Smoking Status Former Smoker  . Quit date: 10/30/1990  . Years since quitting: 30.0  Smokeless Tobacco Never Used    Goals Met:  Proper associated with RPD/PD & O2 Sat Exercise tolerated well Strength training completed today  Goals Unmet:  Not Applicable  Comments: Service time is from 1040 to 1140    Dr. [name] is Medical Director for Cardiac Rehab at [facility].

## 2020-11-25 ENCOUNTER — Encounter (HOSPITAL_COMMUNITY)
Admission: RE | Admit: 2020-11-25 | Discharge: 2020-11-25 | Disposition: A | Discharge status: Home / Self Care | Payer: Medicare Other | Source: Ambulatory Visit | Attending: Cardiology | Admitting: Cardiology

## 2020-11-25 ENCOUNTER — Other Ambulatory Visit: Payer: Self-pay

## 2020-11-25 DIAGNOSIS — J438 Other emphysema: Secondary | ICD-10-CM

## 2020-11-25 DIAGNOSIS — J449 Chronic obstructive pulmonary disease, unspecified: Secondary | ICD-10-CM | POA: Diagnosis not present

## 2020-11-25 NOTE — Progress Notes (Signed)
Daily Session Note  Patient Details  Name: William Lee MRN: [id number] Date of Birth: 05-14-1946 Referring Provider:   April Manson Pulmonary Rehab Walk Test from 09/17/2020 in [place]  Referring Provider [name], MD      Encounter Date: 11/25/2020  Check In:  Session Check In - 11/25/20 [place]      Check-In   Supervising physician immediately available to respond to emergencies [facility] immediately available    Physician(s) Dr. Antonieta Pert    Location [facility]    Staff Present Rosebud Poles, RN, BSN;Carlette Wilber Oliphant, RN, Isaac Laud, MS, ACSM-CEP, Exercise Physiologist    Virtual Visit No    Medication changes reported     No    Fall or balance concerns reported    No    Tobacco Cessation No Change    Warm-up and Cool-down Performed on first and last piece of equipment    Resistance Training Performed Yes    VAD Patient? No    PAD/SET Patient? No      Pain Assessment   Currently in Pain? No/denies    Multiple Pain Sites No           Capillary Blood Glucose: No results found for this or any previous visit (from the past 24 hour(s)).    Social History   Tobacco Use  Smoking Status Former Smoker  . Quit date: 10/30/1990  . Years since quitting: 30.0  Smokeless Tobacco Never Used    Goals Met:  Proper associated with RPD/PD & O2 Sat Exercise tolerated well Strength training completed today  Goals Unmet:  Not Applicable  Comments: Service time is from 1045 to 1145    Dr. [name] is Medical Director for Cardiac Rehab at [facility].

## 2020-11-30 ENCOUNTER — Other Ambulatory Visit: Payer: Self-pay

## 2020-11-30 ENCOUNTER — Encounter (HOSPITAL_COMMUNITY)
Admission: RE | Admit: 2020-11-30 | Discharge: 2020-11-30 | Disposition: A | Discharge status: Home / Self Care | Payer: Medicare Other | Source: Ambulatory Visit | Attending: Cardiology | Admitting: Cardiology

## 2020-11-30 DIAGNOSIS — J438 Other emphysema: Secondary | ICD-10-CM

## 2020-11-30 NOTE — Progress Notes (Signed)
Daily Session Note  Patient Details  Name: William Lee MRN: [id number] Date of Birth: [birth year] Referring Provider:   April Manson Pulmonary Rehab Walk Test from 09/17/2020 in [facility]  Referring Provider [name], MD      Encounter Date: 11/30/2020  Check In:  Session Check In - 11/30/20 1122      Check-In   Supervising physician immediately available to respond to emergencies [facility] immediately available    Physician(s) Dr. Antonieta Pert    Location [facility]    Staff Present Rosebud Poles, RN, BSN;Carlette Wilber Oliphant, RN, Isaac Laud, MS, ACSM-CEP, Exercise Physiologist    Virtual Visit No    Medication changes reported     No    Fall or balance concerns reported    No    Tobacco Cessation No Change    Warm-up and Cool-down Performed on first and last piece of equipment    Resistance Training Performed Yes    VAD Patient? No    PAD/SET Patient? No      Pain Assessment   Currently in Pain? No/denies    Multiple Pain Sites No           Capillary Blood Glucose: No results found for this or any previous visit (from the past 24 hour(s)).    Social History   Tobacco Use  Smoking Status Former Smoker  . Quit date: 10/30/1990  . Years since quitting: 30.1  Smokeless Tobacco Never Used    Goals Met:  Proper associated with RPD/PD & O2 Sat Independence with exercise equipment Exercise tolerated well No report of cardiac concerns or symptoms Strength training completed today  Goals Unmet:  Not Applicable  Comments: Service time is from 1045 to 1145    Dr. [name] is Medical Director for Cardiac Rehab at [facility].

## 2020-12-01 ENCOUNTER — Ambulatory Visit
Admission: RE | Admit: 2020-12-01 | Discharge: 2020-12-01 | Disposition: A | Discharge status: Home / Self Care | Payer: Medicare Other | Source: Ambulatory Visit | Attending: Family Medicine | Admitting: Family Medicine

## 2020-12-01 DIAGNOSIS — G8929 Other chronic pain: Secondary | ICD-10-CM

## 2020-12-01 DIAGNOSIS — M545 Low back pain, unspecified: Secondary | ICD-10-CM

## 2020-12-02 ENCOUNTER — Other Ambulatory Visit: Payer: Self-pay

## 2020-12-02 ENCOUNTER — Encounter (HOSPITAL_COMMUNITY)
Admission: RE | Admit: 2020-12-02 | Discharge: 2020-12-02 | Disposition: A | Discharge status: Home / Self Care | Payer: Medicare Other | Source: Ambulatory Visit | Attending: Cardiology | Admitting: Cardiology

## 2020-12-02 DIAGNOSIS — J438 Other emphysema: Secondary | ICD-10-CM

## 2020-12-02 NOTE — Progress Notes (Signed)
Daily Session Note  Patient Details  Name: LAVELL SUPPLE MRN: [id number] Date of Birth: [birth year] Referring Provider:   April Manson Pulmonary Rehab Walk Test from 09/17/2020 in [facility]  Referring Provider [name], MD      Encounter Date: 12/02/2020  Check In:  Session Check In - 12/02/20 1126      Check-In   Supervising physician immediately available to respond to emergencies [facility] immediately available    Physician(s) Dr. Eliseo Squires    Location [facility]    Staff Present Rosebud Poles, RN, BSN;Carlette Wilber Oliphant, RN, Isaac Laud, MS, ACSM-CEP, Exercise Physiologist    Virtual Visit No    Medication changes reported     No    Fall or balance concerns reported    No    Tobacco Cessation No Change    Resistance Training Performed No    VAD Patient? No    PAD/SET Patient? No      Pain Assessment   Currently in Pain? No/denies    Multiple Pain Sites No           Capillary Blood Glucose: No results found for this or any previous visit (from the past 24 hour(s)).    Social History   Tobacco Use  Smoking Status Former Smoker  . Quit date: 10/30/1990  . Years since quitting: 30.1  Smokeless Tobacco Never Used    Goals Met:  Proper associated with RPD/PD & O2 Sat Exercise tolerated well  Goals Unmet:  Not Applicable  Comments: Service time is from 1045 to 1120 Patient completed program today and made improvements on 6 minute walk test. Pt plans to continue exercise at home.     Dr. [name] is Medical Director for Cardiac Rehab at [facility].

## 2020-12-10 ENCOUNTER — Other Ambulatory Visit: Payer: Self-pay | Admitting: Family Medicine

## 2020-12-10 DIAGNOSIS — M545 Low back pain, unspecified: Secondary | ICD-10-CM

## 2020-12-10 DIAGNOSIS — R52 Pain, unspecified: Secondary | ICD-10-CM

## 2020-12-13 ENCOUNTER — Ambulatory Visit
Admission: RE | Admit: 2020-12-13 | Discharge: 2020-12-13 | Disposition: A | Discharge status: Home / Self Care | Payer: Medicare Other | Source: Ambulatory Visit | Attending: Family Medicine | Admitting: Family Medicine

## 2020-12-13 ENCOUNTER — Other Ambulatory Visit: Payer: Self-pay

## 2020-12-13 ENCOUNTER — Other Ambulatory Visit: Payer: Self-pay | Admitting: Family Medicine

## 2020-12-13 DIAGNOSIS — R52 Pain, unspecified: Secondary | ICD-10-CM

## 2020-12-14 ENCOUNTER — Inpatient Hospital Stay: Admission: RE | Admit: 2020-12-14 | Payer: Medicare Other | Source: Ambulatory Visit

## 2020-12-24 NOTE — Addendum Note (Signed)
Encounter addended by: George Ina, RD on: 12/24/2020 10:58 AM  Actions taken: Flowsheet accepted

## 2020-12-27 NOTE — Progress Notes (Signed)
Discharge Progress Report  Patient Details  Name: William Lee MRN: [id number] Date of Birth: 07/09/1946 Referring Provider:   Flowsheet Row Pulmonary Rehab Walk Test from 09/17/2020 in [facility]  Referring Provider Traci Turner, MD       Number of Visits: 19  Reason for Discharge:  Patient reached a stable level of exercise. Patient independent in their exercise. Patient has met program and personal goals.  Smoking History:  Social History   Tobacco Use  Smoking Status Former Smoker  . Quit date: 10/30/1990  . Years since quitting: 30.1  Smokeless Tobacco Never Used    Diagnosis:  Other emphysema (HCC)  ADL [facility]SD:  Pulmonary Assessment Scores    Row Name 09/17/20 1127 12/02/20 1212       ADL UCSD   ADL Phase - Exit    SOB Score total 67  Pre 69         CAT Score   CAT Score Pre 26 20         mMRC Score   mMRC Score 4 4           Initial Exercise Prescription:  Initial Exercise Prescription - 09/17/20 1300      Date of Initial Exercise RX and Referring Provider   Date 09/17/20    Referring Provider Traci Turner, MD    Expected Discharge Date 11/18/20      Oxygen   Oxygen Continuous    Liters 2      NuStep   Level 2    SPM 75    Minutes 25    METs 2      Prescription Details   Frequency (times per week) 2    Duration Progress to 30 minutes of continuous aerobic without signs/symptoms of physical distress      Intensity   THRR 40-80% of Max Heartrate 58-116    Ratings of Perceived Exertion 11-13    Perceived Dyspnea 0-4      Progression   Progression Continue progressive overload as per policy without signs/symptoms or physical distress.      Resistance Training   Training Prescription Yes    Weight blue bands    Reps 10-15           Discharge Exercise Prescription (Final Exercise Prescription Changes):  Exercise Prescription Changes - 11/23/20 1300      Response to Exercise   Blood Pressure  (Admit) 108/58    Blood Pressure (Exercise) 154/60    Blood Pressure (Exit) 100/50    Heart Rate (Admit) 89 bpm    Heart Rate (Exercise) 116 bpm    Heart Rate (Exit) 99 bpm    Oxygen Saturation (Admit) 99 %    Oxygen Saturation (Exercise) 97 %    Oxygen Saturation (Exit) 100 %    Rating of Perceived Exertion (Exercise) 14    Perceived Dyspnea (Exercise) 3    Duration Continue with 30 min of aerobic exercise without signs/symptoms of physical distress.    Intensity THRR unchanged      Progression   Progression Continue to progress workloads to maintain intensity without signs/symptoms of physical distress.      Resistance Training   Training Prescription Yes    Reps 10-15    Time 10 Minutes      Oxygen   Oxygen Continuous    Liters 2      NuStep   Level 5    SPM 80    Minutes 15      METs 2.6      Track   Laps 12    Minutes 15           Functional Capacity:  6 Minute Walk    Row Name 09/17/20 1045 12/02/20 1204       6 Minute Walk   Phase Initial Discharge    Distance 1000 feet 1156 feet    Distance % Change - 15.6 %    Distance Feet Change - 156 ft    Walk Time 6 minutes 6 minutes    # of Rest Breaks 0 0    MPH 1.9 1.89    METS 3 3.15    RPE 14 14    Perceived Dyspnea  3 3    VO2 Peak 10.5 11.02    Symptoms Yes (comment) Yes (comment)  Bilateral leg pain due to PAD    Comments Bilateral calf pain 5/10. Left buttock pain 7/10, SOB RPD = 3 Bilateral leg pain due to PAD 9/10    Resting HR 100 bpm 98 bpm    Resting BP 130/60 128/54    Resting Oxygen Saturation  100 % 99 %    Exercise Oxygen Saturation  during 6 min walk 97 % 95 %    Max Ex. HR 123 bpm 130 bpm    Max Ex. BP 172/74 182/66    2 Minute Post BP 150/70 162/60         Interval HR   1 Minute HR 118 112    2 Minute HR 120 118    3 Minute HR 120 118    4 Minute HR 120 125    5 Minute HR 122 128    6 Minute HR 123 130    2 Minute Post HR 110 110    Interval Heart Rate? - Yes          Interval Oxygen   Interval Oxygen? - Yes    Baseline Oxygen Saturation % 100 % 99 %    1 Minute Oxygen Saturation % 99 % 98 %    1 Minute Liters of Oxygen 2 L 2 L    2 Minute Oxygen Saturation % 97 % 96 %    2 Minute Liters of Oxygen 2 L 2 L    3 Minute Oxygen Saturation % 97 % 96 %    3 Minute Liters of Oxygen 2 L 2 L    4 Minute Oxygen Saturation % 97 % 96 %    4 Minute Liters of Oxygen 2 L 2 L    5 Minute Oxygen Saturation % 97 % 95 %    5 Minute Liters of Oxygen 2 L 2 L    6 Minute Oxygen Saturation % 97 % 95 %    6 Minute Liters of Oxygen 2 L 2 L    2 Minute Post Oxygen Saturation % 99 % 97 %    2 Minute Post Liters of Oxygen 2 L 2 L           Psychological, QOL, Others - Outcomes: PHQ 2/9: Depression screen PHQ 2/9 12/02/2020 09/17/2020 09/17/2020 05/18/2016 01/24/2016  Decreased Interest 0 0 0 0 0  Down, Depressed, Hopeless 0 0 0 0 0  PHQ - 2 Score 0 0 0 0 0  Altered sleeping 0 0 - - -  Tired, decreased energy 0 2 - - -  Change in appetite 0 2 - - -  Feeling bad or failure about yourself  0   0 - - -  Trouble concentrating 0 0 - - -  Moving slowly or fidgety/restless 0 0 - - -  Suicidal thoughts 0 0 - - -  PHQ-9 Score 0 4 - - -  Difficult doing work/chores Not difficult at all Not difficult at all - - -    Quality of Life:   Personal Goals: Goals established at orientation with interventions provided to work toward goal.  Personal Goals and Risk Factors at Admission - 09/17/20 0957      Core Components/Risk Factors/Patient Goals on Admission    Weight Management Weight Gain;Yes    Admit Weight 102 lb (46.3 kg)    Goal Weight: Short Term 115 lb (52.2 kg)    Goal Weight: Long Term 125 lb (56.7 kg)    Improve shortness of breath with ADL's Yes    Intervention Provide education, individualized exercise plan and daily activity instruction to help decrease symptoms of SOB with activities of daily living.    Expected Outcomes Short Term: Improve cardiorespiratory  fitness to achieve a reduction of symptoms when performing ADLs;Long Term: Be able to perform more ADLs without symptoms or delay the onset of symptoms    Hypertension Yes    Intervention Provide education on lifestyle modifcations including regular physical activity/exercise, weight management, moderate sodium restriction and increased consumption of fresh fruit, vegetables, and low fat dairy, alcohol moderation, and smoking cessation.;Monitor prescription use compliance.    Expected Outcomes Short Term: Continued assessment and intervention until BP is < 140/80m HG in hypertensive participants. < 130/839mHG in hypertensive participants with diabetes, heart failure or chronic kidney disease.;Long Term: Maintenance of blood pressure at goal levels.    Lipids Yes    Intervention Provide education and support for participant on nutrition & aerobic/resistive exercise along with prescribed medications to achieve LDL <7014mHDL >45m62m  Stress Yes    Intervention Offer individual and/or small group education and counseling on adjustment to heart disease, stress management and health-related lifestyle change. Teach and support self-help strategies.    Expected Outcomes Short Term: Participant demonstrates changes in health-related behavior, relaxation and other stress management skills, ability to obtain effective social support, and compliance with psychotropic medications if prescribed.;Long Term: Emotional wellbeing is indicated by absence of clinically significant psychosocial distress or social isolation.            Personal Goals Discharge:  Goals and Risk Factor Review    Row Name 09/20/20 1151 10/05/20 0928 11/08/20 1325         Core Components/Risk Factors/Patient Goals Review   Personal Goals Review Develop more efficient breathing techniques such as purse lipped breathing and diaphragmatic breathing and practicing self-pacing with activity.;Increase knowledge of respiratory medications  and ability to use respiratory devices properly.;Improve shortness of breath with ADL's Increase knowledge of respiratory medications and ability to use respiratory devices properly.;Improve shortness of breath with ADL's;Develop more efficient breathing techniques such as purse lipped breathing and diaphragmatic breathing and practicing self-pacing with activity. Develop more efficient breathing techniques such as purse lipped breathing and diaphragmatic breathing and practicing self-pacing with activity.;Increase knowledge of respiratory medications and ability to use respiratory devices properly.;Improve shortness of breath with ADL's     Review Has not started exercising in pulmonary rehab yet , but will in the next day. NeilLequant started the program, has attended 3 exercise sessions, was in pulmonary rehab in 2017 and still remembers the thera-band exercises and performs them at home occassionally. He has gained  approximately 5 pounds following the suggestions of our dietician.  He is attendance is regular and works hard while exercising. His workloads continue to increase.     Expected Outcomes For patient to make progression towards goals in the next 30 days. To see and increase in strength, stamina, and endurance in the next full 30 days. Continue to increase workloads as tolerated.            Exercise Goals and Review:  Exercise Goals    Row Name 09/17/20 0941             Exercise Goals   Increase Physical Activity Yes       Expected Outcomes Long Term: Exercising regularly at least 3-5 days a week.;Long Term: Add in home exercise to make exercise part of routine and to increase amount of physical activity.;Short Term: Attend rehab on a regular basis to increase amount of physical activity.       Increase Strength and Stamina Yes       Expected Outcomes Short Term: Increase workloads from initial exercise prescription for resistance, speed, and METs.;Short Term: Perform resistance  training exercises routinely during rehab and add in resistance training at home;Long Term: Improve cardiorespiratory fitness, muscular endurance and strength as measured by increased METs and functional capacity (6MWT)       Able to understand and use rate of perceived exertion (RPE) scale Yes       Intervention Provide education and explanation on how to use RPE scale       Expected Outcomes Short Term: Able to use RPE daily in rehab to express subjective intensity level;Long Term:  Able to use RPE to guide intensity level when exercising independently       Able to understand and use Dyspnea scale Yes       Intervention Provide education and explanation on how to use Dyspnea scale       Expected Outcomes Short Term: Able to use Dyspnea scale daily in rehab to express subjective sense of shortness of breath during exertion;Long Term: Able to use Dyspnea scale to guide intensity level when exercising independently       Knowledge and understanding of Target Heart Rate Range (THRR) Yes       Intervention Provide education and explanation of THRR including how the numbers were predicted and where they are located for reference       Expected Outcomes Short Term: Able to state/look up THRR;Short Term: Able to use daily as guideline for intensity in rehab;Long Term: Able to use THRR to govern intensity when exercising independently       Understanding of Exercise Prescription Yes       Intervention Provide education, explanation, and written materials on patient's individual exercise prescription       Expected Outcomes Short Term: Able to explain program exercise prescription;Long Term: Able to explain home exercise prescription to exercise independently              Exercise Goals Re-Evaluation:  Exercise Goals Re-Evaluation    Row Name 10/12/20 0725 11/09/20 0747           Exercise Goal Re-Evaluation   Exercise Goals Review Increase Physical Activity;Increase Strength and Stamina;Able to  understand and use rate of perceived exertion (RPE) scale;Able to understand and use Dyspnea scale;Knowledge and understanding of Target Heart Rate Range (THRR);Understanding of Exercise Prescription Increase Physical Activity;Increase Strength and Stamina;Able to understand and use rate of perceived exertion (RPE) scale;Able to understand and use  Dyspnea scale;Knowledge and understanding of Target Heart Rate Range (THRR);Understanding of Exercise Prescription      Comments Charleton has completed 4 exercise sessions and has tolerated well so far. In that short time he has made progressions with workload increases and MET level increases. He is exercising at 2.1 METS on the Nustep. He had been through the program before and on his first session back he was independent with all resistance band exercises. Harrell is doing well so far. Will continue to monitor and progress as he is able. Elvyn has completed 12 exercise sessions and has been steady with making workload increases and MET increases. He is now walking the track for 15 minutes after the Nustep and has been tolerating that well so far. He is exercising at 2.4 METS on the Nustep and 2.39 METS walking the track. Will continue to monitor and progress as he is able.      Expected Outcomes Through exercise at rehab and home the patient will decrease shortness of breath with daily activities and feel confident in carrying out an exercise regimn at home. Through exercise at rehab and home the patient will decrease shortness of breath with daily activities and feel confident in carrying out an exercise regimn at home.             Nutrition & Weight - Outcomes:  Pre Biometrics - 09/17/20 1059      Pre Biometrics   Grip Strength 32 kg           Post Biometrics - 12/02/20 1204       Post  Biometrics   Grip Strength 20 kg           Nutrition:  Nutrition Therapy & Goals - 09/28/20 1449      Nutrition Therapy   Diet High Calorie, High Protein       Personal Nutrition Goals   Nutrition Goal Pt to identify food quantities necessary to achieve weight gain of 0.25 lbs per week while in cardiac rehab    Personal Goal #2 Eat mini meals 4-5 times per day    Personal Goal #3 Choose nutrient dense foods that are high in calories      Intervention Plan   Intervention Prescribe, educate and counsel regarding individualized specific dietary modifications aiming towards targeted core components such as weight, hypertension, lipid management, diabetes, heart failure and other comorbidities.    Expected Outcomes Long Term Goal: Adherence to prescribed nutrition plan.;Short Term Goal: A plan has been developed with personal nutrition goals set during dietitian appointment.           Nutrition Discharge:  Nutrition Assessments - 09/28/20 1451      Rate Your Plate Scores   Pre Score 45           Education Questionnaire Score:  Knowledge Questionnaire Score - 09/17/20 1137      Knowledge Questionnaire Score   Pre Score 12/13           Goals reviewed with patient; copy given to patient. 

## 2020-12-27 NOTE — Addendum Note (Signed)
Encounter addended by: Lance Morin, RN on: 12/27/2020 1:04 PM  Actions taken: Clinical Note Signed, Episode resolved

## 2021-05-27 ENCOUNTER — Other Ambulatory Visit: Payer: Self-pay | Admitting: Neurosurgery

## 2021-06-03 NOTE — Progress Notes (Addendum)
Surgical Instructions    Your procedure is scheduled on Friday August 12.  Report to [facility] Entr[facility] "A" at 1230 PM., then check in with the Admitting office.  Call this number if you have problems the morning of surgery:  [phone]   If you have any questions prior to your surgery date call [phone]: Open Monday-Friday 8am-4pm    Remember:  Do not eat after midnight the night before your surgery  You may drink clear liquids until 11:30am the morning of your surgery.   Clear liquids allowed are: Water, Non-Citrus Juices (without pulp), Carbonated Beverages, Clear Tea, Black Coffee Only, and Gatorade    Take these medicines the morning of surgery with A SIP OF WATER :             albuterol (PROVENTIL HFA;VENTOLIN HFA) if needed             atorvastatin (LIPITOR)             budesonide-formoterol (SYMBICORT)              cyclobenzaprine (FLEXERIL)             omeprazole (PRILOSEC)             potassium chloride             SPIRIVA HANDIHALER   As of today, STOP taking any Aspirin (unless otherwise instructed by your surgeon) Aleve, Naproxen, Ibuprofen, Motrin, Advil, Goody's, BC's, all herbal medications, fish oil, and all vitamins.          Do not wear jewelry or makeup Do not wear lotions, powders, perfumes/colognes, or deodorant. Do not shave 48 hours prior to surgery.  Men may shave face and neck. Do not bring valuables to the hospital. DO Not wear nail polish, gel polish, artificial nails, or any other type of covering on  natural nails including finger and toenails. If patients have artificial nails, gel coating, etc. that need to be removed by a nail salon please have this removed prior to surgery or surgery may need to be canceled/delayed if the surgeon/ anesthesia feels like the patient is unable to be adequately monitored.             [facility] is not responsible for any belongings or valuables.  Do NOT Smoke (Tobacco/Vaping) or drink Alcohol 24 hours  prior to your procedure If you use a CPAP at night, you may bring all equipment for your overnight stay.   Contacts, glasses, dentures or bridgework may not be worn into surgery, please bring cases for these belongings   For patients admitted to the hospital, discharge time will be determined by your treatment team.   Patients discharged the day of surgery will not be allowed to drive home, and someone needs to stay with them for 24 hours.  ONLY 1 SUPPORT PERSON MAY BE PRESENT WHILE YOU ARE IN SURGERY. IF YOU ARE TO BE ADMITTED ONCE YOU ARE IN YOUR ROOM YOU WILL BE ALLOWED TWO (2) VISITORS.  Minor children may have two parents present. Special consideration for safety and communication needs will be reviewed on a case by case basis.  Special instructions:    Oral Hygiene is also important to reduce your risk of infection.  Remember - BRUSH YOUR TEETH THE MORNING OF SURGERY WITH YOUR REGULAR TOOTHPASTE   [facility]- Preparing For Surgery  Before surgery, you can play an important role. Because skin is not sterile, your skin needs to be as  free of germs as possible. You can reduce the number of germs on your skin by washing with CHG (chlorahexidine gluconate) Soap before surgery.  CHG is an antiseptic cleaner which kills germs and bonds with the skin to continue killing germs even after washing.     Please do not use if you have an allergy to CHG or antibacterial soaps. If your skin becomes reddened/irritated stop using the CHG.  Do not shave (including legs and underarms) for at least 48 hours prior to first CHG shower. It is OK to shave your face.  Please follow these instructions carefully.     Shower the NIGHT BEFORE SURGERY and the MORNING OF SURGERY with CHG Soap.   If you chose to wash your hair, wash your hair first as usual with your normal shampoo. After you shampoo, rinse your hair and body thoroughly to remove the shampoo.  Then [employer] and genitals (private parts) with your  normal soap and rinse thoroughly to remove soap.  After that Use CHG Soap as you would any other liquid soap. You can apply CHG directly to the skin and wash gently with a scrungie or a clean washcloth.   Apply the CHG Soap to your body ONLY FROM THE NECK DOWN.  Do not use on open wounds or open sores. Avoid contact with your eyes, ears, mouth and genitals (private parts). Wash Face and genitals (private parts)  with your normal soap.   Wash thoroughly, paying special attention to the area where your surgery will be performed.  Thoroughly rinse your body with warm water from the neck down.  DO NOT shower/wash with your normal soap after using and rinsing off the CHG Soap.  Pat yourself dry with a CLEAN TOWEL.  Wear CLEAN PAJAMAS to bed the night before surgery  Place CLEAN SHEETS on your bed the night before your surgery  DO NOT SLEEP WITH PETS.   Day of Surgery:  Take a shower with CHG soap. Wear Clean/Comfortable clothing the morning of surgery Do not apply any deodorants/lotions.   Remember to brush your teeth WITH YOUR REGULAR TOOTHPASTE.   Please read over the following fact sheets that you were given.

## 2021-06-06 ENCOUNTER — Encounter (HOSPITAL_COMMUNITY)
Admission: RE | Admit: 2021-06-06 | Discharge: 2021-06-06 | Disposition: A | Discharge status: Home / Self Care | Payer: Medicare Other | Source: Ambulatory Visit | Attending: Neurosurgery | Admitting: Neurosurgery

## 2021-06-06 ENCOUNTER — Encounter (HOSPITAL_COMMUNITY): Payer: Self-pay

## 2021-06-06 ENCOUNTER — Other Ambulatory Visit: Payer: Self-pay

## 2021-06-06 DIAGNOSIS — Z01812 Encounter for preprocedural laboratory examination: Secondary | ICD-10-CM | POA: Diagnosis not present

## 2021-06-06 HISTORY — DX: Malignant (primary) neoplasm, unspecified: C80.1

## 2021-06-06 LAB — COMPREHENSIVE METABOLIC PANEL
ALT: 9 U/L (ref 0–44)
AST: 17 U/L (ref 15–41)
Albumin: 3.4 g/dL — ABNORMAL LOW (ref 3.5–5.0)
Alkaline Phosphatase: 83 U/L (ref 38–126)
Anion gap: 10 (ref 5–15)
BUN: 18 mg/dL (ref 8–23)
CO2: 24 mmol/L (ref 22–32)
Calcium: 9.2 mg/dL (ref 8.9–10.3)
Chloride: 101 mmol/L (ref 98–111)
Creatinine, Ser: 1.5 mg/dL — ABNORMAL HIGH (ref 0.61–1.24)
GFR, Estimated: 48 mL/min — ABNORMAL LOW (ref >60)
Glucose, Bld: 117 mg/dL — ABNORMAL HIGH (ref 70–99)
Potassium: 4.5 mmol/L (ref 3.5–5.1)
Sodium: 135 mmol/L (ref 135–145)
Total Bilirubin: 1.1 mg/dL (ref 0.3–1.2)
Total Protein: 7 g/dL (ref 6.5–8.1)

## 2021-06-06 LAB — CBC
HCT: 33.1 % — ABNORMAL LOW (ref 39.0–52.0)
Hemoglobin: 10.8 g/dL — ABNORMAL LOW (ref 13.0–17.0)
MCH: 28.2 pg (ref 26.0–34.0)
MCHC: 32.6 g/dL (ref 30.0–36.0)
MCV: 86.4 fL (ref 80.0–100.0)
Platelets: 360 10*3/uL (ref 150–400)
RBC: 3.83 MIL/uL — ABNORMAL LOW (ref 4.22–5.81)
RDW: 15.5 % (ref 11.5–15.5)
WBC: 9.4 10*3/uL (ref 4.0–10.5)
nRBC: 0 % (ref 0.0–0.2)

## 2021-06-06 NOTE — Progress Notes (Addendum)
Surgical Instructions    Your procedure is scheduled on Friday August 12.  Report to [facility]tr[facility] "A" at 1230 PM., then check in with the Admitting office.  Call this number if you have problems the morning of surgery:  [phone]   If you have any questions prior to your surgery date call [phone]: Open Monday-Friday 8am-4pm    Remember:  Do not eat after midnight the night before your surgery  You may drink clear liquids until 11:30am the morning of your surgery.   Clear liquids allowed are: Water, Non-Citrus Juices (without pulp), Carbonated Beverages, Clear Tea, Black Coffee Only, and Gatorade    Take these medicines the morning of surgery with A SIP OF WATER :             albuterol (PROVENTIL HFA;VENTOLIN HFA) if needed             Fluticasone-Salmeterol (ADVAIR) 250-50 MCG/DOSE AEPB             omeprazole (PRILOSEC)             potassium chloride             SPIRIVA HANDIHALER   As of today, STOP taking any Aspirin (unless otherwise instructed by your surgeon) Aleve, Naproxen, Ibuprofen, Motrin, Advil, Goody's, BC's, all herbal medications, fish oil, and all vitamins.          Do not wear jewelry or makeup Do not wear lotions, powders, perfumes/colognes, or deodorant. Do not shave 48 hours prior to surgery.  Men may shave face and neck. Do not bring valuables to the hospital. DO Not wear nail polish, gel polish, artificial nails, or any other type of covering on  natural nails including finger and toenails. If patients have artificial nails, gel coating, etc. that need to be removed by a nail salon please have this removed prior to surgery or surgery may need to be canceled/delayed if the surgeon/ anesthesia feels like the patient is unable to be adequately monitored.             [facility] is not responsible for any belongings or valuables.  Do NOT Smoke (Tobacco/Vaping) or drink Alcohol 24 hours prior to your procedure If you use a CPAP at night, you may  bring all equipment for your overnight stay.   Contacts, glasses, dentures or bridgework may not be worn into surgery, please bring cases for these belongings   For patients admitted to the hospital, discharge time will be determined by your treatment team.   Patients discharged the day of surgery will not be allowed to drive home, and someone needs to stay with them for 24 hours.  ONLY 1 SUPPORT PERSON MAY BE PRESENT WHILE YOU ARE IN SURGERY. IF YOU ARE TO BE ADMITTED ONCE YOU ARE IN YOUR ROOM YOU WILL BE ALLOWED TWO (2) VISITORS.  Minor children may have two parents present. Special consideration for safety and communication needs will be reviewed on a case by case basis.  Special instructions:    Oral Hygiene is also important to reduce your risk of infection.  Remember - BRUSH YOUR TEETH THE MORNING OF SURGERY WITH YOUR REGULAR TOOTHPASTE   [facility]- Preparing For Surgery  Before surgery, you can play an important role. Because skin is not sterile, your skin needs to be as free of germs as possible. You can reduce the number of germs on your skin by washing with CHG (chlorahexidine gluconate) Soap before surgery.  CHG  is an antiseptic cleaner which kills germs and bonds with the skin to continue killing germs even after washing.     Please do not use if you have an allergy to CHG or antibacterial soaps. If your skin becomes reddened/irritated stop using the CHG.  Do not shave (including legs and underarms) for at least 48 hours prior to first CHG shower. It is OK to shave your face.  Please follow these instructions carefully.     Shower the NIGHT BEFORE SURGERY and the MORNING OF SURGERY with CHG Soap.   If you chose to wash your hair, wash your hair first as usual with your normal shampoo. After you shampoo, rinse your hair and body thoroughly to remove the shampoo.  Then [employer] and genitals (private parts) with your normal soap and rinse thoroughly to remove soap.  After  that Use CHG Soap as you would any other liquid soap. You can apply CHG directly to the skin and wash gently with a scrungie or a clean washcloth.   Apply the CHG Soap to your body ONLY FROM THE NECK DOWN.  Do not use on open wounds or open sores. Avoid contact with your eyes, ears, mouth and genitals (private parts). Wash Face and genitals (private parts)  with your normal soap.   Wash thoroughly, paying special attention to the area where your surgery will be performed.  Thoroughly rinse your body with warm water from the neck down.  DO NOT shower/wash with your normal soap after using and rinsing off the CHG Soap.  Pat yourself dry with a CLEAN TOWEL.  Wear CLEAN PAJAMAS to bed the night before surgery  Place CLEAN SHEETS on your bed the night before your surgery  DO NOT SLEEP WITH PETS.   Day of Surgery:  Take a shower with CHG soap. Wear Clean/Comfortable clothing the morning of surgery Do not apply any deodorants/lotions.   Remember to brush your teeth WITH YOUR REGULAR TOOTHPASTE.   Please read over the following fact sheets that you were given.

## 2021-06-06 NOTE — Progress Notes (Signed)
PCP - Leonard Downing MD Cardiologist - Mertie Moores MD  PPM/ICD - denies Device Orders -  Rep Notified -   Chest x-ray - 01/13/19 EKG - 09/02/20 Stress Test -  ECHO - 01/10/18 Cardiac Cath - 1996  Sleep Study - no CPAP - no  Fasting Blood Sugar - n/a   Blood Thinner Instructions:n/a Aspirin Instructions:stopped Aspirin the last week of July  ERAS Protcol -yes ,clears until 1130 PRE-SURGERY Ensure or G2- no  COVID TEST- n/a,ambulatory surgery    Anesthesia review:yes-cardiac history,home O2  Patient denies shortness of breath, fever, cough and chest pain at PAT appointment   All instructions explained to the patient, with a verbal understanding of the material. Patient agrees to go over the instructions while at home for a better understanding. Patient also instructed to self quarantine after being tested for COVID-19. The opportunity to ask questions was provided.

## 2021-06-07 NOTE — Progress Notes (Signed)
Anesthesia Chart Review:  Follows with ENT at [facility] for history of laryngeal papilloma.  He had excision with cidofovir injection on 06-25-12 and 02-02-14 and 01-12-15 with avastin and 08-10-15 with cidofovir and 08-28-17 with cidofovir. Mild to moderate grade dysplasia 01-12-15 and mild dysplasia 08-10-15 and low grade dysplasia 08-28-17.  Last seen by Dr. Wonda Amis 04/07/2021.  Per note, "He has stable papilloma on his true vocal folds. Will reassess in 6 months as he feels like he is functioning well vocally. No need for vocal fold surgery at this point."  Follows with pulmonologist Dr. 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  He is a former smoker with 90-pack-year history, quit ~30 years ago.  He is maintained on Spiriva, Advair, as needed albuterol.  Completed pulmonary rehabilitation note: In February 2022. He is currently undergoing SBRT through [facility] for left upper lobe nodule.    Follows with cardiology for history of CAD s/p CABG 1996, mild to moderate MR, HFrEF.  Last seen by Dr. Acie Fredrickson 09/02/2020.  At that time it was discussed he would possibly need surgery for his lung cancer.  Dr. Acie Fredrickson commented, "Is very stable from a cardiac standpont.  No angina . He is at low risk from a cardiac standpoint to have any biopsy or procedure needed."  He was advised to follow-up in 1 year.  Preop labs reviewed, mild anemia with hemoglobin 10.8, mild renal insufficiency with creatinine 1.50 (baseline appears to be around 1.2), otherwise unremarkable.  EKG 09/02/2020: NSR.  Rate 84.  Nonspecific ST/T abnormalities.  No significant changes.  03/02/21 pulmonary function tests (Care Everywhere): FEV1: 0.71 (30% predicted), FVC: 2.30 (74% predicted), DLCO: 8.41 (42.2% predicted).  PET scan 03/15/2021 (Care Everywhere): IMPRESSION:  1.  Redemonstrated mixed cystic and solid lesion in the left upper lobe.  The solid component has FDG avidity slightly above liver and is concerning  for a primary  malignancy.  2.  No evidence of FDG avid metastatic disease.  3.  Focus of FDG avidity in the ascending colon. Correlate with colonoscopy  for further evaluation of possible neoplasm.   CT chest 03/02/2021 (Care Everywhere): 1. The complex solid and cystic lesion in the left upper lobe continues to  slowly enlarged and most likely represents a primary lung cancer. A new  solid component posteriorly measures fluid attenuation and therefore could  represent infection/fluid within a pre-existing emphysematous space versus  additional invasive component of cancer. No findings to suggest metastatic  disease in the thorax.  2. Slight worsening of bronchial wall thickening and areas mucus impaction  likely chronic bronchitis and/or aspiration.   TTE 12/31/2017: - Procedure narrative: Transthoracic echocardiography. Image    quality was adequate. The study was technically difficult due to    variations in respirations.  - Left ventricle: The cavity size was normal. Wall thickness was    normal. Systolic function was mildly to moderately reduced. The    estimated ejection fraction was in the range of 40% to 45%. There    is akinesis of the inferolateral myocardium. There is hypokinesis    of the basalinferior myocardium. Doppler parameters are    consistent with abnormal left ventricular relaxation (grade 1    diastolic dysfunction).  - Mitral valve: Calcified annulus.   Impressions:   - Hypokinesis of the inferobasal wall and akinesis of the    inferolateral wall; overall mild to moderate LV dysfunction; mild    diastolic dysfunction.    Karoline Caldwell,  PA-C [facility]/Anesthesiology Phone [phone] 06/07/2021 12:30 PM

## 2021-06-07 NOTE — H&P (Signed)
Patient ID:   [id number] Patient: William Lee  Date of Birth: Feb 04, 1946 Visit Type: Office Visit   Date: 05/18/2021 04:45 PM Provider: Marchia Meiers. Vertell Limber MD   This 75 year old male presents for back/neck pain.  HISTORY OF PRESENT ILLNESS: 1.  back/neck pain  This is a telehealth visit due to the COVID-19 pandemic  The patient's EMG testing reveals Comprehensive electrodiagnostic study of upper extremities bilaterally revealed severe bilateral median neuropathy at the wrist (carpal tunnel syndrome).  Pathophysiology in addition to motor demyelination includes profound sensory axonal loss with no median sensory responses obtainable.  There is no evidence for any concomitant cervical radiculopathy, brachial plexopathy or polyneuropathy.  The patient is continuing to have a lot of difficulty with his hands.  I have recommended proceeding with staged carpal tunnel release.  He says his right hand bothers him more and will plan on doing right carpal tunnel release 1st with left carpal tunnel release to follow.  He says he should be able to tolerate sedation.  We will plan on proceeding with surgery at [facility] in the near future      Medical/Surgical/Interim History Reviewed, no change.  Last detailed document date:12/20/2020.     PAST MEDICAL HISTORY, SURGICAL HISTORY, FAMILY HISTORY, SOCIAL HISTORY AND REVIEW OF SYSTEMS I have reviewed the patient's past medical, surgical, family and social history as well as the comprehensive review of systems as included on the [facility] history form dated 05/17/2021, which I have signed.  Family History: Reviewed, no changes.  Last detailed document date:12/20/2020.   Social History: Reviewed, no changes. Last detailed document date: 12/20/2020.    MEDICATIONS: (added, continued or stopped this visit) Started Medication Directions Instruction Stopped  albuterol sulfate HFA 90 mcg/actuation aerosol  inhaler inhale 2 puff by inhalation route  every 4 - 6 hours as needed    Aspirin Low Dose 81 mg tablet,delayed release take 1 tablet by oral route  every day    atorvastatin 20 mg tablet take 1 tablet by oral route  every day    budesonide-formoterol HFA 160 mcg-4.5 mcg/actuation aerosol inhaler     cyclobenzaprine 10 mg tablet     diltiazem ER 120 mg capsule,extended release 12 hr take 1 capsule by oral route 2 times every day    omeprazole 20 mg tablet,delayed release take 2 capsule by oral route 2 times every day    OXYGEN     potassium chloride ER 20 mEq tablet,extended release take 1 tablet by oral route  every day    Spiriva with HandiHaler 18 mcg and inhalation capsules inhale 1 capsule by inhalation route  every day using 2 inhalations via handihaler    triamterene 37.5 mg-hydrochlorothiazide 25 mg tablet take 1 tablet by oral route  every day    valsartan 40 mg tablet     Wixela Inhub 100 mcg-50 mcg/dose powder for inhalation       ALLERGIES: Ingredient Reaction Medication Name Comment ADHESIVE TAPE     Reviewed, no changes.    PHYSICAL EXAM:  Vitals Date Temp F BP Pulse Ht In Wt Lb BMI BSA Pain Score 05/18/2021        5/10     IMPRESSION:  Severe bilateral carpal tunnel syndrome.  Plan bilateral carpal tunnel release and a staged basis with right side 1st  PLAN: Right carpal tunnel release followed by left to be performed at [facility]   Assessment/Plan  # Detail Type Description  1. Assessment  Bilateral carpal tunnel syndrome (G56.03).     2. Assessment Pulmonary nodules (R91.8).     3. Assessment Tremor (R25.1).     4. 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     5. 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       Pain Management Plan Pain Scale: 5/10. Method: Numeric Pain Intensity Scale. Location: back, neck. Onset:  06/30/2020. Duration: varies. Quality: discomforting. Pain management follow-up plan of care: Patient will continue medication management..              Provider:  Marchia Meiers. Vertell Limber MD  05/19/2021 02:43 PM    Dictation edited by: Marchia Meiers. Vertell Limber    CC Providers: [place],  [place]  [id number]   [address]  [facility] [address] [place], [place] [address]-               Electronically signed by Marchia Meiers. Vertell Limber MD on 05/19/2021 02:44 PM

## 2021-06-09 NOTE — Progress Notes (Signed)
Patient voiced understanding of new arrival time of 0910. Voiced understanding of ERAS- clear liquids okay until 0810.

## 2021-06-09 NOTE — Progress Notes (Signed)
Mr. Sasse called to inform us that he takes Prednisone 20 mg daily, he forgot to tell us and is asking if he may take it in am. I asked patient if he ever takes it on a empty stomach, patient said that it doesn't; bother him without food. I instructed Mr. Simonian to take it in am with water.

## 2021-06-10 ENCOUNTER — Encounter (HOSPITAL_COMMUNITY)
Admission: RE | Disposition: A | Discharge status: Home / Self Care | Payer: Self-pay | Source: Home / Self Care | Attending: Neurosurgery

## 2021-06-10 ENCOUNTER — Ambulatory Visit (HOSPITAL_COMMUNITY): Payer: Medicare Other | Admitting: Physician Assistant

## 2021-06-10 ENCOUNTER — Encounter (HOSPITAL_COMMUNITY): Payer: Self-pay | Admitting: Neurosurgery

## 2021-06-10 ENCOUNTER — Ambulatory Visit (HOSPITAL_COMMUNITY)
Admission: RE | Admit: 2021-06-10 | Discharge: 2021-06-10 | Disposition: A | Discharge status: Home / Self Care | Payer: Medicare Other | Attending: Neurosurgery | Admitting: Neurosurgery

## 2021-06-10 ENCOUNTER — Other Ambulatory Visit: Payer: Self-pay

## 2021-06-10 ENCOUNTER — Ambulatory Visit (HOSPITAL_COMMUNITY): Payer: Medicare Other | Admitting: Anesthesiology

## 2021-06-10 DIAGNOSIS — M48061 Spinal stenosis, lumbar region without neurogenic claudication: Secondary | ICD-10-CM | POA: Diagnosis not present

## 2021-06-10 DIAGNOSIS — R251 Tremor, unspecified: Secondary | ICD-10-CM | POA: Insufficient documentation

## 2021-06-10 DIAGNOSIS — G5603 Carpal tunnel syndrome, bilateral upper limbs: Secondary | ICD-10-CM | POA: Insufficient documentation

## 2021-06-10 DIAGNOSIS — Z7951 Long term (current) use of inhaled steroids: Secondary | ICD-10-CM | POA: Diagnosis not present

## 2021-06-10 DIAGNOSIS — R918 Other nonspecific abnormal finding of lung field: Secondary | ICD-10-CM | POA: Insufficient documentation

## 2021-06-10 DIAGNOSIS — Z7952 Long term (current) use of systemic steroids: Secondary | ICD-10-CM | POA: Diagnosis not present

## 2021-06-10 DIAGNOSIS — Z79899 Other long term (current) drug therapy: Secondary | ICD-10-CM | POA: Diagnosis not present

## 2021-06-10 HISTORY — PX: CARPAL TUNNEL RELEASE: SHX101

## 2021-06-10 SURGERY — CARPAL TUNNEL RELEASE
Anesthesia: Monitor Anesthesia Care | Site: Wrist | Laterality: Right

## 2021-06-10 MED ORDER — ORAL CARE MOUTH RINSE: 15.0000 mL | Freq: Once | OROMUCOSAL | Status: AC

## 2021-06-10 MED ORDER — CHLORHEXIDINE GLUCONATE 0.12 % MT SOLN
15.0000 mL | Freq: Once | OROMUCOSAL | Status: AC
  Administered 2021-06-10: 15 mL via OROMUCOSAL
  Filled 2021-06-10: qty 15

## 2021-06-10 MED ORDER — PROPOFOL 500 MG/50ML IV EMUL
INTRAVENOUS | Status: DC | PRN
  Administered 2021-06-10: 100 ug/kg/min via INTRAVENOUS

## 2021-06-10 MED ORDER — PROPOFOL 10 MG/ML IV BOLUS
INTRAVENOUS | Status: AC
  Filled 2021-06-10: qty 20

## 2021-06-10 MED ORDER — TRAMADOL HCL 50 MG PO TABS: 50.0000 mg | ORAL_TABLET | Freq: Four times a day (QID) | ORAL | Status: DC | PRN

## 2021-06-10 MED ORDER — CHLORHEXIDINE GLUCONATE CLOTH 2 % EX PADS: 6.0000 | MEDICATED_PAD | Freq: Once | CUTANEOUS | Status: DC

## 2021-06-10 MED ORDER — LACTATED RINGERS IV SOLN: INTRAVENOUS | Status: DC

## 2021-06-10 MED ORDER — TRAMADOL HCL 50 MG PO TABS: 50.0000 mg | ORAL_TABLET | Freq: Four times a day (QID) | ORAL | 0 refills | Status: DC | PRN

## 2021-06-10 MED ORDER — PROPOFOL 1000 MG/100ML IV EMUL
INTRAVENOUS | Status: AC
  Filled 2021-06-10: qty 100

## 2021-06-10 MED ORDER — 0.9 % SODIUM CHLORIDE (POUR BTL) OPTIME
TOPICAL | Status: DC | PRN
  Administered 2021-06-10: 1000 mL

## 2021-06-10 MED ORDER — CEFAZOLIN SODIUM-DEXTROSE 2-4 GM/100ML-% IV SOLN
INTRAVENOUS | Status: AC
  Filled 2021-06-10: qty 100

## 2021-06-10 MED ORDER — LIDOCAINE HCL 1 % IJ SOLN
INTRAMUSCULAR | Status: AC
  Filled 2021-06-10: qty 20

## 2021-06-10 MED ORDER — CEFAZOLIN SODIUM-DEXTROSE 2-4 GM/100ML-% IV SOLN
2.0000 g | INTRAVENOUS | Status: AC
  Administered 2021-06-10: 2 g via INTRAVENOUS

## 2021-06-10 MED ORDER — LIDOCAINE HCL (PF) 1 % IJ SOLN
INTRAMUSCULAR | Status: DC | PRN
  Administered 2021-06-10: 10 mL

## 2021-06-10 SURGICAL SUPPLY — 32 items
BAG COUNTER SPONGE SURGICOUNT (BAG) ×2 IMPLANT
BLADE SURG 15 STRL LF DISP TIS (BLADE) ×1 IMPLANT
BLADE SURG 15 STRL SS (BLADE) ×1
BNDG GAUZE ELAST 4 BULKY (GAUZE/BANDAGES/DRESSINGS) ×2 IMPLANT
BNDG STRETCH 4X75 NS LF (GAUZE/BANDAGES/DRESSINGS) ×2 IMPLANT
BNDG STRETCH 4X75 STRL LF (GAUZE/BANDAGES/DRESSINGS) IMPLANT
CABLE BIPOLOR RESECTION CORD (MISCELLANEOUS) ×2 IMPLANT
DRAPE EXTREMITY T 121X128X90 (DISPOSABLE) ×2 IMPLANT
DRAPE HALF SHEET 40X57 (DRAPES) ×2 IMPLANT
DRSG ADAPTIC 3X8 NADH LF (GAUZE/BANDAGES/DRESSINGS) ×2 IMPLANT
DRSG EMULSION OIL 3X3 NADH (GAUZE/BANDAGES/DRESSINGS) IMPLANT
GAUZE 4X4 16PLY ~~LOC~~+RFID DBL (SPONGE) ×2 IMPLANT
GAUZE SPONGE 4X4 12PLY STRL (GAUZE/BANDAGES/DRESSINGS) ×2 IMPLANT
GLOVE SURG ENC MOIS LTX SZ8 (GLOVE) ×4 IMPLANT
GLOVE SURG UNDER POLY LF SZ8.5 (GLOVE) ×4 IMPLANT
GOWN STRL REUS W/ TWL LRG LVL3 (GOWN DISPOSABLE) ×1 IMPLANT
GOWN STRL REUS W/ TWL XL LVL3 (GOWN DISPOSABLE) IMPLANT
GOWN STRL REUS W/TWL 2XL LVL3 (GOWN DISPOSABLE) ×2 IMPLANT
GOWN STRL REUS W/TWL LRG LVL3 (GOWN DISPOSABLE) ×1
GOWN STRL REUS W/TWL XL LVL3 (GOWN DISPOSABLE)
KIT BASIN OR (CUSTOM PROCEDURE TRAY) ×2 IMPLANT
KIT TURNOVER KIT B (KITS) ×2 IMPLANT
NEEDLE HYPO 25X1 1.5 SAFETY (NEEDLE) ×2 IMPLANT
NS IRRIG 1000ML POUR BTL (IV SOLUTION) ×2 IMPLANT
PACK SURGICAL SETUP 50X90 (CUSTOM PROCEDURE TRAY) ×2 IMPLANT
STOCKINETTE 4X48 STRL (DRAPES) ×2 IMPLANT
SUT ETHILON 3 0 PS 1 (SUTURE) ×2 IMPLANT
SYR BULB EAR ULCER 3OZ GRN STR (SYRINGE) ×2 IMPLANT
SYR CONTROL 10ML LL (SYRINGE) ×2 IMPLANT
TOWEL GREEN STERILE (TOWEL DISPOSABLE) ×2 IMPLANT
TOWEL GREEN STERILE FF (TOWEL DISPOSABLE) ×2 IMPLANT
UNDERPAD 30X36 HEAVY ABSORB (UNDERPADS AND DIAPERS) ×2 IMPLANT

## 2021-06-10 NOTE — Brief Op Note (Signed)
06/10/2021  11:29 AM  PATIENT:  William Lee  75 y.o. male  PRE-OPERATIVE DIAGNOSIS:  Bilateral carpal tunnel syndrome  POST-OPERATIVE DIAGNOSIS:  Bilateral carpal tunnel syndrome  PROCEDURE:  Procedure(s) with comments: Right Carpal Tunnel Release (Right) - RM 21  SURGEON:  Surgeon(s) and Role:    Erline Levine, MD - Primary  PHYSICIAN ASSISTANT:   ASSISTANTS: none   ANESTHESIA:   local and MAC  EBL:  Minimal  BLOOD ADMINISTERED:none  DRAINS: none   LOCAL MEDICATIONS USED:  LIDOCAINE   SPECIMEN:  No Specimen  DISPOSITION OF SPECIMEN:  N/A  COUNTS:  YES  TOURNIQUET:  * No tourniquets in log *  DICTATION:DICTATION:   Patient was given intravenous sedation and positioned with his right arm on the arm board.  His hand and arm were prepped and draped with betadine scrub and paint and sterile stockinet.  Right wrist was infiltrated with lidocaine and an incision was made over a length of 2 cm in line with the fourth ray.  The flexor retinaculum was incised and the carpal tunnel was decompressed.  Decompression was carried into the volar wrist and into the distal palm.  Hemostasis was assured. The wound was irrigated and closed with 3-0 nylon vertical mattress stitches and dressed with a sterile occlusive dressing with Adaptic, fluff gauze, Kerlix and cling wrap. Patient was taken to recovery in stable and satisfactory condition. Counts were correct at the end of the case.   PLAN OF CARE: Discharge to home after PACU  PATIENT DISPOSITION:  PACU - hemodynamically stable.   Delay start of Pharmacological VTE agent (>24hrs) due to surgical blood loss or risk of bleeding: yes

## 2021-06-10 NOTE — Interval H&P Note (Signed)
History and Physical Interval Note:  06/10/2021 10:12 AM  William Lee  has presented today for surgery, with the diagnosis of Bilateral carpal tunnel syndrome.  The various methods of treatment have been discussed with the patient and family. After consideration of risks, benefits and other options for treatment, the patient has consented to  Procedure(s) with comments: Right Carpal tunnel release (Right) - RM 21 as a surgical intervention.  The patient's history has been reviewed, patient examined, no change in status, stable for surgery.  I have reviewed the patient's chart and labs.  Questions were answered to the patient's satisfaction.     Peggyann Shoals

## 2021-06-10 NOTE — Op Note (Signed)
06/10/2021  11:29 AM  PATIENT:  William Lee  75 y.o. male  PRE-OPERATIVE DIAGNOSIS:  Bilateral carpal tunnel syndrome  POST-OPERATIVE DIAGNOSIS:  Bilateral carpal tunnel syndrome  PROCEDURE:  Procedure(s) with comments: Right Carpal Tunnel Release (Right) - RM 21  SURGEON:  Surgeon(s) and Role:    Erline Levine, MD - Primary  PHYSICIAN ASSISTANT:   ASSISTANTS: none   ANESTHESIA:   local and MAC  EBL:  Minimal  BLOOD ADMINISTERED:none  DRAINS: none   LOCAL MEDICATIONS USED:  LIDOCAINE   SPECIMEN:  No Specimen  DISPOSITION OF SPECIMEN:  N/A  COUNTS:  YES  TOURNIQUET:  * No tourniquets in log *  DICTATION:DICTATION:   Patient was given intravenous sedation and positioned with his right arm on the arm board.  His hand and arm were prepped and draped with betadine scrub and paint and sterile stockinet.  Right wrist was infiltrated with lidocaine and an incision was made over a length of 2 cm in line with the fourth ray.  The flexor retinaculum was incised and the carpal tunnel was decompressed.  Decompression was carried into the volar wrist and into the distal palm.  Hemostasis was assured. The wound was irrigated and closed with 3-0 nylon vertical mattress stitches and dressed with a sterile occlusive dressing with Adaptic, fluff gauze, Kerlix and cling wrap. Patient was taken to recovery in stable and satisfactory condition. Counts were correct at the end of the case.   PLAN OF CARE: Discharge to home after PACU  PATIENT DISPOSITION:  PACU - hemodynamically stable.   Delay start of Pharmacological VTE agent (>24hrs) due to surgical blood loss or risk of bleeding: yes

## 2021-06-10 NOTE — Anesthesia Procedure Notes (Signed)
Procedure Name: MAC Date/Time: 06/10/2021 10:47 AM Performed by: Barrington Ellison, CRNA Pre-anesthesia Checklist: Patient identified, Emergency Drugs available, Suction available, Patient being monitored and Timeout performed Patient Re-evaluated:Patient Re-evaluated prior to induction Oxygen Delivery Method: Simple face mask

## 2021-06-10 NOTE — Transfer of Care (Signed)
Immediate Anesthesia Transfer of Care Note  Patient: William Lee  Procedure(s) Performed: Right Carpal Tunnel Release (Right: Wrist)  Patient Location: PACU  Anesthesia Type:MAC  Level of Consciousness: awake, alert  and oriented  Airway & Oxygen Therapy: Patient Spontanous Breathing and Patient connected to nasal cannula oxygen  Post-op Assessment: Report given to RN  Post vital signs: Reviewed and stable  Last Vitals:  Vitals Value Taken Time  BP 107/57   Temp 36.5 C 06/10/21 1130  Pulse 91 06/10/21 1132  Resp 12 06/10/21 1133  SpO2 100 % 06/10/21 1132  Vitals shown include unvalidated device data.  Last Pain:  Vitals:   06/10/21 0936  TempSrc:   PainSc: 0-No pain         Complications: No notable events documented.

## 2021-06-10 NOTE — Anesthesia Postprocedure Evaluation (Signed)
Anesthesia Post Note  Patient: KANDEN CAREY  Procedure(s) Performed: Right Carpal Tunnel Release (Right: Wrist)     Patient location during evaluation: PACU Anesthesia Type: MAC Level of consciousness: awake and alert and oriented Pain management: pain level controlled Vital Signs Assessment: post-procedure vital signs reviewed and stable Respiratory status: spontaneous breathing, nonlabored ventilation and respiratory function stable Cardiovascular status: blood pressure returned to baseline Postop Assessment: no apparent nausea or vomiting Anesthetic complications: no   No notable events documented.  Last Vitals:  Vitals:   06/10/21 1145 06/10/21 1200  BP: (!) 113/59 114/69  Pulse: 79 84  Resp: 14 18  Temp:  36.6 C  SpO2: 99% 100%    Last Pain:  Vitals:   06/10/21 1200  TempSrc:   PainSc: 0-No pain                 Brennan Bailey

## 2021-06-10 NOTE — Anesthesia Preprocedure Evaluation (Addendum)
Anesthesia Evaluation  Patient identified by MRN, date of birth, ID band Patient awake    Reviewed: Allergy & Precautions, NPO status , Patient's Chart, lab work & pertinent test results  History of Anesthesia Complications (+) PONV and history of anesthetic complications  Airway Mallampati: II  TM Distance: >3 FB Neck ROM: Full    Dental no notable dental hx. (+) Partial Lower,    Pulmonary asthma , COPD,  COPD inhaler and oxygen dependent, former smoker,  H/o lung cancer (just finished radiation) SEVERE copd   Pulmonary exam normal breath sounds clear to auscultation       Cardiovascular hypertension, Pt. on medications + CAD and + Past MI  Normal cardiovascular exam Rhythm:Regular Rate:Normal  Most recent EKG reviewed   Neuro/Psych Hearing loss  Essential tremor (present since high school) negative psych ROS   GI/Hepatic GERD  Controlled,(+)     substance abuse  marijuana use,   Endo/Other  negative endocrine ROS  Renal/GU negative Renal ROS  negative genitourinary   Musculoskeletal negative musculoskeletal ROS (+)   Abdominal   Peds negative pediatric ROS (+)  Hematology  (+) anemia ,   Anesthesia Other Findings   Reproductive/Obstetrics negative OB ROS                          Anesthesia Physical Anesthesia Plan  ASA: 4  Anesthesia Plan: MAC   Post-op Pain Management:    Induction: Intravenous  PONV Risk Score and Plan: 1 and TIVA, Propofol infusion, Treatment may vary due to age or medical condition and Ondansetron  Airway Management Planned: Simple Face Mask and Natural Airway  Additional Equipment:   Intra-op Plan:   Post-operative Plan:   Informed Consent: I have reviewed the patients History and Physical, chart, labs and discussed the procedure including the risks, benefits and alternatives for the proposed anesthesia with the patient or authorized  representative who has indicated his/her understanding and acceptance.       Plan Discussed with: CRNA and Anesthesiologist  Anesthesia Plan Comments: (Local by surgeon. Propofol gtt. Natural airway. GA/LMA as backup. Long discussion with patient as he states he is NOT a candidate for general anesthesia and that is why he hasn't had lung surgery or back surgery. I advised that the plan is to do local with propofol sedation but our goal is to keep him safe and comfortable and to give the best surgical conditions and sometimes that requires a transition to general anesthesia intraoperatively. He is apprehensive but expressed understanding. He is highly motivated to NOT have GA and would be an appropriate candidated for local/sedation. Norton Blizzard, MD  )      Anesthesia Quick Evaluation

## 2021-06-10 NOTE — Discharge Summary (Signed)
Physician Discharge Summary  Patient ID: William Lee MRN: [id number] DOB/AGE: 76-Mar-1947 76 y.o.  Admit date: 06/10/2021 Discharge date: 06/10/2021  Admission Diagnoses:Bilateral carpal tunnel syndrome  Discharge Diagnoses: Same Active Problems:   * No active hospital problems. *   Discharged Condition: good  Hospital Course: Patient underwent right carpal tunnel release and was discharged home from PACU after uneventful procedure and recovery  Consults: None  Significant Diagnostic Studies: None  Treatments: surgery: Patient underwent right carpal tunnel release  Discharge Exam: Blood pressure (!) 176/77, pulse 82, temperature 97.7 F (36.5 C), resp. rate 16, height '5\' 2"'$  (1.575 m), weight 44.9 kg, SpO2 100 %. Neurologic: Grossly normal Wound:CDI  Disposition: Home  Discharge Instructions      Remove dressing in 72 hours   Complete by: As directed    Diet - low sodium heart healthy   Complete by: As directed    Increase activity slowly   Complete by: As directed       Allergies as of 06/10/2021       Reactions   Silver Rash, Other (See Comments)   Pulls skin off when removed Pulls skin off when removed   Tape Rash   Also cant use tegaderm.        Medication List     TAKE these medications    albuterol 108 (90 Base) MCG/ACT inhaler Commonly known as: VENTOLIN HFA Inhale 2 puffs into the lungs every 6 (six) hours as needed for wheezing.   aspirin 81 MG tablet Take 81 mg by mouth daily.   atorvastatin 40 MG tablet Commonly known as: LIPITOR Take 20 mg by mouth daily.   Cholecalciferol 50 MCG (2000 UT) Tabs Take 2,000 Units by mouth daily.   diltiazem 120 MG 24 hr capsule Commonly known as: CARDIZEM CD Take 120 mg by mouth at bedtime.   Fluticasone-Salmeterol 250-50 MCG/DOSE Aepb Commonly known as: ADVAIR Inhale 1 puff into the lungs 2 (two) times daily. Per the [place] PCP - Duggal Patient will complete Symbicort until bottle empty then will  switch to wixela on 09/23/20   Ibuprofen 200 MG Caps Take 600-800 mg by mouth every 6 (six) hours as needed (pain/headache/fever).   omeprazole 20 MG capsule Commonly known as: PRILOSEC Take 40 mg by mouth 2 (two) times daily.   OXYGEN Inhale 2 L into the lungs as needed (During activity).   polyvinyl alcohol 1.4 % ophthalmic solution Commonly known as: LIQUIFILM TEARS Place 1 drop into both eyes as needed for dry eyes.   potassium chloride 20 MEQ/15ML (10%) Soln Take 20 mEq by mouth 2 (two) times daily.   predniSONE 20 MG tablet Commonly known as: DELTASONE Take 20 mg by mouth daily with breakfast.   SALONPAS EX Apply 1 patch topically 2 (two) times daily as needed (pain).   Spiriva HandiHaler 18 MCG inhalation capsule Generic drug: tiotropium Place 18 mcg into inhaler and inhale daily.   traMADol 50 MG tablet Commonly known as: ULTRAM Take 1 tablet (50 mg total) by mouth every 6 (six) hours as needed for moderate pain.   triamterene-hydrochlorothiazide 37.5-25 MG tablet Commonly known as: MAXZIDE-25 Take 1 tablet by mouth daily. Reported on 01/24/2016   valsartan 40 MG tablet Commonly known as: DIOVAN TAKE 1 TABLET BY MOUTH EVERY DAY   Vicks NyQuil Cough 6.25-15 MG/15ML Liqd Generic drug: Doxylamine-DM Take 15 mLs by mouth 2 (two) times daily as needed (congestion/sleep).         Signed: Peggyann Shoals, MD  06/10/2021, 11:52 AM

## 2021-06-11 ENCOUNTER — Encounter (HOSPITAL_COMMUNITY): Payer: Self-pay | Admitting: Neurosurgery

## 2021-06-21 NOTE — H&P (Signed)
Erline Levine, MD  Physician  Neurosurgery  H&P     Signed  Date of Service:  06/07/2021  7:58 AM           Signed                 Patient ID:                   [id number] Patient:            William Lee                Date of Birth:   11/04/1945 Visit Type:       Office Visit                               Date:    05/18/2021 04:45 PM Provider:          Marchia Meiers. Vertell Limber MD    This 75 year old male presents for back/neck pain.   HISTORY OF PRESENT ILLNESS: 1.  back/neck pain  This is a telehealth visit due to the COVID-19 pandemic   The patient's EMG testing reveals Comprehensive electrodiagnostic study of upper extremities bilaterally revealed severe bilateral median neuropathy at the wrist (carpal tunnel syndrome).  Pathophysiology in addition to motor demyelination includes profound sensory axonal loss with no median sensory responses obtainable.  There is no evidence for any concomitant cervical radiculopathy, brachial plexopathy or polyneuropathy.   The patient is continuing to have a lot of difficulty with his hands.  I have recommended proceeding with staged carpal tunnel release.  He says his right hand bothers him more and will plan on doing right carpal tunnel release 1st with left carpal tunnel release to follow.   He says he should be able to tolerate sedation.  We will plan on proceeding with surgery at [facility] in the near future           Medical/Surgical/Interim History Reviewed, no change.  Last detailed document date:12/20/2020.        PAST MEDICAL HISTORY, SURGICAL HISTORY, FAMILY HISTORY, SOCIAL HISTORY AND REVIEW OF SYSTEMS I have reviewed the patient's past medical, surgical, family and social history as well as the comprehensive review of systems as included on the [facility] history form dated 05/17/2021, which I have signed.   Family History: Reviewed, no changes.  Last detailed document date:12/20/2020.    Social  History: Reviewed, no changes. Last detailed document date: 12/20/2020.     MEDICATIONS: (added, continued or stopped this visit) Started Medication Directions Instruction Stopped   albuterol sulfate HFA 90 mcg/actuation aerosol inhaler inhale 2 puff by inhalation route  every 4 - 6 hours as needed       Aspirin Low Dose 81 mg tablet,delayed release take 1 tablet by oral route  every day       atorvastatin 20 mg tablet take 1 tablet by oral route  every day       budesonide-formoterol HFA 160 mcg-4.5 mcg/actuation aerosol inhaler         cyclobenzaprine 10 mg tablet         diltiazem ER 120 mg capsule,extended release 12 hr take 1 capsule by oral route 2 times every day       omeprazole 20 mg tablet,delayed release take 2 capsule by oral route 2 times every day       OXYGEN  potassium chloride ER 20 mEq tablet,extended release take 1 tablet by oral route  every day       Spiriva with HandiHaler 18 mcg and inhalation capsules inhale 1 capsule by inhalation route  every day using 2 inhalations via handihaler       triamterene 37.5 mg-hydrochlorothiazide 25 mg tablet take 1 tablet by oral route  every day       valsartan 40 mg tablet         Wixela Inhub 100 mcg-50 mcg/dose powder for inhalation             ALLERGIES: Ingredient Reaction Medication Name Comment ADHESIVE TAPE         Reviewed, no changes.       PHYSICAL EXAM:   Vitals Date Temp F BP Pulse Ht In Wt Lb BMI BSA Pain Score 05/18/2021               5/10         IMPRESSION:   Severe bilateral carpal tunnel syndrome.  Plan bilateral carpal tunnel release and a staged basis with right side 1st   PLAN: Right carpal tunnel release followed by left to be [facility]     Assessment/Plan   # Detail Type Description  1. Assessment Bilateral carpal tunnel syndrome (G56.03).        2. Assessment Pulmonary nodules  (R91.8).        3. Assessment Tremor (R25.1).        4. 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        5. 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             Pain Management Plan Pain Scale: 5/10. Method: Numeric Pain Intensity Scale. Location: back, neck. Onset: 06/30/2020. Duration: varies. Quality: discomforting. Pain management follow-up plan of care: Patient will continue medication management..                     Provider:  Marchia Meiers. Vertell Limber MD  05/19/2021 02:43 PM       Dictation edited by: Marchia Meiers. Vertell Limber       CC Providers: [place],  [place]  [phone]-     Wilson Elkins  [facility] [address] [place], [place] [address]-                             Electronically signed by Marchia Meiers. Vertell Limber MD on 05/19/2021 02:44 PM           Routing History

## 2021-06-28 ENCOUNTER — Other Ambulatory Visit: Payer: Self-pay | Admitting: Neurosurgery

## 2021-07-29 ENCOUNTER — Encounter (HOSPITAL_COMMUNITY): Payer: Self-pay | Admitting: Neurosurgery

## 2021-07-29 NOTE — Progress Notes (Signed)
Anesthesia Chart Review:SAME DAY WORK-UP  Case: [id number] Date/Time: 08/01/21 1332   Procedure: Left carpal tunnel release (Left)   Anesthesia type: Monitor Anesthesia Care   Pre-op diagnosis: Bilateral carpal tunnel syndrome   Location: [facility] OR ROOM 21 / [place] [facility]   Surgeons: Erline Levine, MD       DISCUSSION: Patient is a 75 year old male scheduled for the above procedure.  See previous APP note by Karoline Caldwell, PA-C signed on 06/07/2021.  Patient underwent right carpal tunnel release on 06/10/2021.  He now presents for left carpal tunnel release.  He is a same-day work-up, so labs on arrival was indicated.  Anesthesia team to evaluate on the day of surgery.   EKG 09/02/2020: NSR.  Rate 84.  Nonspecific ST/T abnormalities.  No significant changes.   03/02/21 pulmonary function tests (Care Everywhere): FEV1: 0.71 (30% predicted), FVC: 2.30 (74% predicted), DLCO: 8.41 (42.2% predicted).   PET scan 03/15/2021 (Care Everywhere): IMPRESSION:  1.  Redemonstrated mixed cystic and solid lesion in the left upper lobe.  The solid component has FDG avidity slightly above liver and is concerning  for a primary malignancy.  2.  No evidence of FDG avid metastatic disease.  3.  Focus of FDG avidity in the ascending colon. Correlate with colonoscopy  for further evaluation of possible neoplasm.    CT chest 03/02/2021 (Care Everywhere): 1. The complex solid and cystic lesion in the left upper lobe continues to  slowly enlarged and most likely represents a primary lung cancer. A new  solid component posteriorly measures fluid attenuation and therefore could  represent infection/fluid within a pre-existing emphysematous space versus  additional invasive component of cancer. No findings to suggest metastatic  disease in the thorax.  2. Slight worsening of bronchial wall thickening and areas mucus impaction  likely chronic bronchitis and/or aspiration.    TTE 12/31/2017: - Procedure narrative: Transthoracic  echocardiography. Image    quality was adequate. The study was technically difficult due to    variations in respirations.  - Left ventricle: The cavity size was normal. Wall thickness was    normal. Systolic function was mildly to moderately reduced. The    estimated ejection fraction was in the range of 40% to 45%. There    is akinesis of the inferolateral myocardium. There is hypokinesis    of the basalinferior myocardium. Doppler parameters are    consistent with abnormal left ventricular relaxation (grade 1    diastolic dysfunction).  - Mitral valve: Calcified annulus.   Impressions:   - Hypokinesis of the inferobasal wall and akinesis of the    inferolateral wall; overall mild to moderate LV dysfunction; mild    diastolic dysfunction.      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   bilateral hearing aids used   Hyperlipidemia    Hypertension    Ischemic heart disease    left Inguinal hernia 02/01/2012   Repaired 05/01/12    MI (myocardial infarction) ([place])    Mixed hyperlipidemia 05/16/2017   Papilloma of larynx  PONV (postoperative nausea and vomiting)    Shortness of breath 11/11/2011   Oxygen @ 2L via [place] prn   SOB (shortness of breath) on exertion     Past Surgical History:  Procedure Laterality Date   CARDIAC CATHETERIZATION     CARPAL TUNNEL RELEASE Right 06/10/2021   Procedure: Right Carpal Tunnel Release;  Surgeon: Erline Levine, MD;  Location: [facility];  Service: Neurosurgery;  Laterality: Right;  RM 21   CHOLECYSTECTOMY  12/1995   COLONOSCOPY WITH PROPOFOL N/A  09/23/2013   Procedure: COLONOSCOPY WITH PROPOFOL;  Surgeon: Winfield Cunas., MD;  Location: [facility];  Service: Endoscopy;  Laterality: N/A;   CORONARY ARTERY BYPASS GRAFT     ESOPHAGOGASTRODUODENOSCOPY (EGD) WITH PROPOFOL N/A 09/23/2013   Procedure: ESOPHAGOGASTRODUODENOSCOPY (EGD) WITH PROPOFOL;  Surgeon: Winfield Cunas., MD;  Location: [facility];  Service: Endoscopy;  Laterality: N/A;   HERNIA REPAIR     INGUINAL HERNIA REPAIR  05/01/2012   Procedure: HERNIA REPAIR INGUINAL ADULT;  Surgeon: Haywood Lasso, MD;  Location: [facility];  Service: General;  Laterality: Left;  inguinal   papalomas virus removal  3/10 7/10 5/11 10/12   PERIPHERAL VASCULAR CATHETERIZATION N/A 04/19/2016   Procedure: Abdominal Aortogram w/Lower Extremity;  Surgeon: Rosetta Posner, MD;  Location: [facility];  Service: Cardiovascular;  Laterality: N/A;    MEDICATIONS: No current facility-administered medications for this encounter.    albuterol (PROVENTIL HFA;VENTOLIN HFA) 108 (90 BASE) MCG/ACT inhaler   aspirin 81 MG tablet   atorvastatin (LIPITOR) 40 MG tablet   Camphor-Menthol-Methyl Sal (SALONPAS EX)   Cholecalciferol 50 MCG (2000 UT) TABS   diltiazem (CARDIZEM CD) 120 MG 24 hr capsule   Doxylamine-DM (VICKS NYQUIL COUGH) 6.25-15 MG/15ML LIQD   Fluticasone-Salmeterol (ADVAIR) 250-50 MCG/DOSE AEPB   Ibuprofen 200 MG CAPS   omeprazole (PRILOSEC) 20 MG capsule   OXYGEN   polyvinyl alcohol (LIQUIFILM TEARS) 1.4 % ophthalmic solution   potassium chloride 20 MEQ/15ML (10%) SOLN   predniSONE (DELTASONE) 20 MG tablet   SPIRIVA HANDIHALER 18 MCG inhalation capsule   traMADol (ULTRAM) 50 MG tablet   triamterene-hydrochlorothiazide (MAXZIDE-25) 37.5-25 MG per tablet   valsartan (DIOVAN) 40 MG tablet     Myra Gianotti, PA-C Surgical Short Stay/Anesthesiology [facility] Phone [phone] [facility] Phone [phone] 07/29/2021 4:26 PM

## 2021-07-29 NOTE — Anesthesia Preprocedure Evaluation (Addendum)
Anesthesia Evaluation  Patient identified by MRN, date of birth, ID band Patient awake    Reviewed: Allergy & Precautions, NPO status , Patient's Chart, lab work & pertinent test results  History of Anesthesia Complications (+) PONV and history of anesthetic complications  Airway Mallampati: I  TM Distance: >3 FB Neck ROM: Full    Dental  (+) Poor Dentition, Missing, Dental Advisory Given   Pulmonary asthma , COPD,  COPD inhaler and oxygen dependent, former smoker,    breath sounds clear to auscultation       Cardiovascular hypertension, Pt. on medications + CAD, + Past MI and + CABG   Rhythm:Regular Rate:Normal     Neuro/Psych negative neurological ROS  negative psych ROS   GI/Hepatic Neg liver ROS, GERD  Medicated,  Endo/Other  negative endocrine ROS  Renal/GU negative Renal ROS     Musculoskeletal   Abdominal Normal abdominal exam  (+)   Peds  Hematology negative hematology ROS (+)   Anesthesia Other Findings   Reproductive/Obstetrics                           Anesthesia Physical Anesthesia Plan  ASA: 3  Anesthesia Plan: MAC   Post-op Pain Management:    Induction: Intravenous  PONV Risk Score and Plan: 3 and Propofol infusion, Ondansetron and Treatment may vary due to age or medical condition  Airway Management Planned: Natural Airway and Simple Face Mask  Additional Equipment: None  Intra-op Plan:   Post-operative Plan:   Informed Consent: I have reviewed the patients History and Physical, chart, labs and discussed the procedure including the risks, benefits and alternatives for the proposed anesthesia with the patient or authorized representative who has indicated his/her understanding and acceptance.       Plan Discussed with: CRNA  Anesthesia Plan Comments: (PAT note written 07/29/2021 by Myra Gianotti, PA-C. )      Anesthesia Quick Evaluation

## 2021-07-29 NOTE — Progress Notes (Signed)
DUE TO COVID-19 ONLY ONE VISITOR IS ALLOWED TO COME WITH YOU AND STAY IN THE WAITING ROOM ONLY DURING PRE OP AND PROCEDURE DAY OF SURGERY.   PCP - Dr Claris Gower Cardiologist - Dr Cleatrice Burke Pulmonology - Dr Delma Freeze, [place]   CT Chest x-ray - 03/02/21 [facility] EKG - 09/02/20 Pul Function Test - 06/20/21 [facility] ECHO - 12/31/17 Cardiac Cath - 1996  ICD Pacemaker/Loop - n/a  Sleep Study -  n/a CPAP - none  Aspirin Instructions: Follow your surgeon's instructions on when to stop aspirin prior to surgery,  If no instructions were given by your surgeon then you will need to call the office for those instructions.  ERAS: Clear liquids til 10:45 am on DOS.  Reviewed clear liquids with patient.  Anesthesia review: Yes  STOP now taking any Aspirin (unless otherwise instructed by your surgeon), Aleve, Naproxen, Ibuprofen, Motrin, Advil, Goody's, BC's, all herbal medications, fish oil, and all vitamins.   Coronavirus Screening Covid test n/a Ambulatory Surgery  Do you have any of the following symptoms:  Cough yes/no: No Fever (>100.36F)  yes/no: No Runny nose yes/no: No Sore throat yes/no: No Shortness of breath  yes/no: Yes- nothing new   Have you traveled in the last 14 days and where? yes/no: No  Patient verbalized understanding of instructions that were given via phone.

## 2021-08-01 ENCOUNTER — Ambulatory Visit (HOSPITAL_COMMUNITY): Payer: Medicare Other | Admitting: Vascular Surgery

## 2021-08-01 ENCOUNTER — Encounter (HOSPITAL_COMMUNITY): Payer: Self-pay | Admitting: Neurosurgery

## 2021-08-01 ENCOUNTER — Encounter (HOSPITAL_COMMUNITY)
Admission: RE | Disposition: A | Discharge status: Home / Self Care | Payer: Self-pay | Source: Home / Self Care | Attending: Neurosurgery

## 2021-08-01 ENCOUNTER — Other Ambulatory Visit: Payer: Self-pay

## 2021-08-01 ENCOUNTER — Ambulatory Visit (HOSPITAL_COMMUNITY)
Admission: RE | Admit: 2021-08-01 | Discharge: 2021-08-01 | Disposition: A | Discharge status: Home / Self Care | Payer: Medicare Other | Attending: Neurosurgery | Admitting: Neurosurgery

## 2021-08-01 DIAGNOSIS — G5602 Carpal tunnel syndrome, left upper limb: Secondary | ICD-10-CM | POA: Diagnosis not present

## 2021-08-01 DIAGNOSIS — Z79899 Other long term (current) drug therapy: Secondary | ICD-10-CM | POA: Diagnosis not present

## 2021-08-01 DIAGNOSIS — I252 Old myocardial infarction: Secondary | ICD-10-CM | POA: Insufficient documentation

## 2021-08-01 DIAGNOSIS — I10 Essential (primary) hypertension: Secondary | ICD-10-CM | POA: Diagnosis not present

## 2021-08-01 DIAGNOSIS — Z951 Presence of aortocoronary bypass graft: Secondary | ICD-10-CM | POA: Insufficient documentation

## 2021-08-01 DIAGNOSIS — G5603 Carpal tunnel syndrome, bilateral upper limbs: Secondary | ICD-10-CM | POA: Diagnosis present

## 2021-08-01 HISTORY — PX: CARPAL TUNNEL RELEASE: SHX101

## 2021-08-01 LAB — CBC
HCT: 34.1 % — ABNORMAL LOW (ref 39.0–52.0)
Hemoglobin: 10.7 g/dL — ABNORMAL LOW (ref 13.0–17.0)
MCH: 27.4 pg (ref 26.0–34.0)
MCHC: 31.4 g/dL (ref 30.0–36.0)
MCV: 87.2 fL (ref 80.0–100.0)
Platelets: 277 10*3/uL (ref 150–400)
RBC: 3.91 MIL/uL — ABNORMAL LOW (ref 4.22–5.81)
RDW: 14.6 % (ref 11.5–15.5)
WBC: 5.6 10*3/uL (ref 4.0–10.5)
nRBC: 0 % (ref 0.0–0.2)

## 2021-08-01 LAB — BASIC METABOLIC PANEL
Anion gap: 8 (ref 5–15)
BUN: 18 mg/dL (ref 8–23)
CO2: 22 mmol/L (ref 22–32)
Calcium: 9 mg/dL (ref 8.9–10.3)
Chloride: 105 mmol/L (ref 98–111)
Creatinine, Ser: 1.2 mg/dL (ref 0.61–1.24)
GFR, Estimated: >60 mL/min (ref >60)
Glucose, Bld: 104 mg/dL — ABNORMAL HIGH (ref 70–99)
Potassium: 4 mmol/L (ref 3.5–5.1)
Sodium: 135 mmol/L (ref 135–145)

## 2021-08-01 SURGERY — CARPAL TUNNEL RELEASE
Anesthesia: Monitor Anesthesia Care | Laterality: Left

## 2021-08-01 MED ORDER — CEFAZOLIN SODIUM-DEXTROSE 2-4 GM/100ML-% IV SOLN
2.0000 g | INTRAVENOUS | Status: AC
  Administered 2021-08-01: 2 g via INTRAVENOUS

## 2021-08-01 MED ORDER — OXYCODONE HCL 5 MG PO TABS
5.0000 mg | ORAL_TABLET | Freq: Once | ORAL | Status: DC | PRN
Start: 2021-08-01 — End: 2021-08-01

## 2021-08-01 MED ORDER — LACTATED RINGERS IV SOLN: INTRAVENOUS | Status: DC

## 2021-08-01 MED ORDER — CHLORHEXIDINE GLUCONATE 0.12 % MT SOLN
OROMUCOSAL | Status: AC
  Administered 2021-08-01: 15 mL via OROMUCOSAL
  Filled 2021-08-01: qty 15

## 2021-08-01 MED ORDER — 0.9 % SODIUM CHLORIDE (POUR BTL) OPTIME
TOPICAL | Status: DC | PRN
  Administered 2021-08-01: 1000 mL

## 2021-08-01 MED ORDER — ONDANSETRON HCL 4 MG/2ML IJ SOLN
INTRAMUSCULAR | Status: DC | PRN
  Administered 2021-08-01: 4 mg via INTRAVENOUS

## 2021-08-01 MED ORDER — ORAL CARE MOUTH RINSE: 15.0000 mL | Freq: Once | OROMUCOSAL | Status: AC

## 2021-08-01 MED ORDER — OXYCODONE HCL 5 MG/5ML PO SOLN
5.0000 mg | Freq: Once | ORAL | Status: DC | PRN
Start: 2021-08-01 — End: 2021-08-01

## 2021-08-01 MED ORDER — PHENYLEPHRINE 40 MCG/ML (10ML) SYRINGE FOR IV PUSH (FOR BLOOD PRESSURE SUPPORT)
PREFILLED_SYRINGE | INTRAVENOUS | Status: DC | PRN
  Administered 2021-08-01: 80 ug via INTRAVENOUS

## 2021-08-01 MED ORDER — CHLORHEXIDINE GLUCONATE CLOTH 2 % EX PADS: 6.0000 | MEDICATED_PAD | Freq: Once | CUTANEOUS | Status: DC

## 2021-08-01 MED ORDER — LIDOCAINE HCL (PF) 1 % IJ SOLN
INTRAMUSCULAR | Status: DC | PRN
  Administered 2021-08-01: 4 mL

## 2021-08-01 MED ORDER — PROPOFOL 500 MG/50ML IV EMUL
INTRAVENOUS | Status: DC | PRN
  Administered 2021-08-01: 125 ug/kg/min via INTRAVENOUS

## 2021-08-01 MED ORDER — ACETAMINOPHEN 325 MG PO TABS: 325.0000 mg | ORAL_TABLET | ORAL | Status: DC | PRN

## 2021-08-01 MED ORDER — ACETAMINOPHEN 10 MG/ML IV SOLN: 1000.0000 mg | Freq: Once | INTRAVENOUS | Status: DC | PRN

## 2021-08-01 MED ORDER — CEFAZOLIN SODIUM-DEXTROSE 2-4 GM/100ML-% IV SOLN
INTRAVENOUS | Status: AC
  Filled 2021-08-01: qty 100

## 2021-08-01 MED ORDER — PROMETHAZINE HCL 25 MG/ML IJ SOLN: 6.2500 mg | INTRAMUSCULAR | Status: DC | PRN

## 2021-08-01 MED ORDER — PROPOFOL 10 MG/ML IV BOLUS
INTRAVENOUS | Status: DC | PRN
  Administered 2021-08-01 (×2): 20 mg via INTRAVENOUS

## 2021-08-01 MED ORDER — CHLORHEXIDINE GLUCONATE 0.12 % MT SOLN: 15.0000 mL | Freq: Once | OROMUCOSAL | Status: AC

## 2021-08-01 MED ORDER — AMISULPRIDE (ANTIEMETIC) 5 MG/2ML IV SOLN: 10.0000 mg | Freq: Once | INTRAVENOUS | Status: DC | PRN

## 2021-08-01 MED ORDER — ACETAMINOPHEN 160 MG/5ML PO SOLN: 325.0000 mg | ORAL | Status: DC | PRN

## 2021-08-01 MED ORDER — FENTANYL CITRATE (PF) 100 MCG/2ML IJ SOLN: 25.0000 ug | INTRAMUSCULAR | Status: DC | PRN

## 2021-08-01 SURGICAL SUPPLY — 36 items
BAG COUNTER SPONGE SURGICOUNT (BAG) ×2 IMPLANT
BLADE SURG 15 STRL LF DISP TIS (BLADE) ×1 IMPLANT
BLADE SURG 15 STRL SS (BLADE) ×1
BNDG ELASTIC 4X5.8 VLCR STR LF (GAUZE/BANDAGES/DRESSINGS) IMPLANT
BNDG GAUZE ELAST 4 BULKY (GAUZE/BANDAGES/DRESSINGS) ×2 IMPLANT
BNDG STRETCH 4X75 STRL LF (GAUZE/BANDAGES/DRESSINGS) ×2 IMPLANT
CABLE BIPOLOR RESECTION CORD (MISCELLANEOUS) ×2 IMPLANT
DECANTER SPIKE VIAL GLASS SM (MISCELLANEOUS) IMPLANT
DRAPE EXTREMITY T 121X128X90 (DISPOSABLE) ×2 IMPLANT
DRAPE HALF SHEET 40X57 (DRAPES) ×2 IMPLANT
DRSG EMULSION OIL 3X3 NADH (GAUZE/BANDAGES/DRESSINGS) ×2 IMPLANT
GAUZE 4X4 16PLY ~~LOC~~+RFID DBL (SPONGE) ×2 IMPLANT
GAUZE SPONGE 4X4 12PLY STRL (GAUZE/BANDAGES/DRESSINGS) ×2 IMPLANT
GAUZE SPONGE 4X4 12PLY STRL LF (GAUZE/BANDAGES/DRESSINGS) ×2 IMPLANT
GLOVE EXAM NITRILE XL STR (GLOVE) IMPLANT
GLOVE SURG ENC MOIS LTX SZ8 (GLOVE) ×2 IMPLANT
GLOVE SURG UNDER POLY LF SZ8.5 (GLOVE) ×2 IMPLANT
GOWN STRL REUS W/ TWL LRG LVL3 (GOWN DISPOSABLE) ×1 IMPLANT
GOWN STRL REUS W/ TWL XL LVL3 (GOWN DISPOSABLE) IMPLANT
GOWN STRL REUS W/TWL 2XL LVL3 (GOWN DISPOSABLE) IMPLANT
GOWN STRL REUS W/TWL LRG LVL3 (GOWN DISPOSABLE) ×1
GOWN STRL REUS W/TWL XL LVL3 (GOWN DISPOSABLE)
KIT BASIN OR (CUSTOM PROCEDURE TRAY) ×2 IMPLANT
KIT TURNOVER KIT B (KITS) ×2 IMPLANT
NEEDLE HYPO 25X1 1.5 SAFETY (NEEDLE) ×2 IMPLANT
NS IRRIG 1000ML POUR BTL (IV SOLUTION) ×2 IMPLANT
PACK SURGICAL SETUP 50X90 (CUSTOM PROCEDURE TRAY) ×2 IMPLANT
PAD ARMBOARD 7.5X6 YLW CONV (MISCELLANEOUS) ×6 IMPLANT
STOCKINETTE 4X48 STRL (DRAPES) ×2 IMPLANT
SUT ETHILON 3 0 PS 1 (SUTURE) ×2 IMPLANT
SYR BULB EAR ULCER 3OZ GRN STR (SYRINGE) ×2 IMPLANT
SYR CONTROL 10ML LL (SYRINGE) ×2 IMPLANT
TOWEL GREEN STERILE (TOWEL DISPOSABLE) ×2 IMPLANT
TOWEL GREEN STERILE FF (TOWEL DISPOSABLE) ×2 IMPLANT
UNDERPAD 30X36 HEAVY ABSORB (UNDERPADS AND DIAPERS) ×2 IMPLANT
WATER STERILE IRR 1000ML POUR (IV SOLUTION) ×2 IMPLANT

## 2021-08-01 NOTE — Op Note (Signed)
08/01/2021  1:35 PM  PATIENT:  William Lee  75 y.o. male  PRE-OPERATIVE DIAGNOSIS:  Bilateral carpal tunnel syndrome  POST-OPERATIVE DIAGNOSIS:  Bilateral carpal tunnel syndrome  PROCEDURE:  Procedure(s): Left carpal tunnel release (Left)  SURGEON:  Surgeon(s) and Role:    Erline Levine, MD - Primary  PHYSICIAN ASSISTANT:   ASSISTANTS: Poteat, RN   ANESTHESIA:   local and IV sedation  EBL:  Minimal  BLOOD ADMINISTERED:none  DRAINS: none   LOCAL MEDICATIONS USED:  LIDOCAINE   SPECIMEN:  No Specimen  DISPOSITION OF SPECIMEN:  N/A  COUNTS:  YES  TOURNIQUET:  * No tourniquets in log *  DICTATION: DICTATION:   Patient was given intravenous sedation and positioned with his left arm on the arm board.  His hand and arm were prepped and draped with betadine scrub and Duraprep paint and sterile stockinet.  Left wrist was infiltrated with lidocaine and an incision was made over a length of 2 cm in line with the fourth ray.  The flexor retinaculum was incised and the carpal tunnel was decompressed.  Decompression was carried into the volar wrist and into the distal palm.  Hemostasis was assured. The wound was irrigated and closed with 3-0 nylon vertical mattress stitches and dressed with a sterile occlusive dressing with Adaptic, fluff gauze, Kerlix and cling wrap. Patient was taken to recovery in stable and satisfactory condition. Counts were correct at the end of the case.   PLAN OF CARE: Discharge to home after PACU  PATIENT DISPOSITION:  PACU - hemodynamically stable.   Delay start of Pharmacological VTE agent (>24hrs) due to surgical blood loss or risk of bleeding: yes

## 2021-08-01 NOTE — Anesthesia Postprocedure Evaluation (Signed)
Anesthesia Post Note  Patient: William Lee  Procedure(s) Performed: Left carpal tunnel release (Left)     Patient location during evaluation: PACU Anesthesia Type: MAC Level of consciousness: awake and alert Pain management: pain level controlled Vital Signs Assessment: post-procedure vital signs reviewed and stable Respiratory status: spontaneous breathing, nonlabored ventilation, respiratory function stable and patient connected to nasal cannula oxygen Cardiovascular status: blood pressure returned to baseline and stable Postop Assessment: no apparent nausea or vomiting Anesthetic complications: no   No notable events documented.  Last Vitals:  Vitals:   08/01/21 1345 08/01/21 1400  BP: 96/85 119/67  Pulse: 77 72  Resp: 12 16  Temp: 36.6 C 36.6 C  SpO2: 100% 100%    Last Pain:  Vitals:   08/01/21 1400  TempSrc:   PainSc: 0-No pain                 Effie Berkshire

## 2021-08-01 NOTE — Brief Op Note (Signed)
08/01/2021  1:35 PM  PATIENT:  William Lee  75 y.o. male  PRE-OPERATIVE DIAGNOSIS:  Bilateral carpal tunnel syndrome  POST-OPERATIVE DIAGNOSIS:  Bilateral carpal tunnel syndrome  PROCEDURE:  Procedure(s): Left carpal tunnel release (Left)  SURGEON:  Surgeon(s) and Role:    Erline Levine, MD - Primary  PHYSICIAN ASSISTANT:   ASSISTANTS: Poteat, RN   ANESTHESIA:   local and IV sedation  EBL:  Minimal  BLOOD ADMINISTERED:none  DRAINS: none   LOCAL MEDICATIONS USED:  LIDOCAINE   SPECIMEN:  No Specimen  DISPOSITION OF SPECIMEN:  N/A  COUNTS:  YES  TOURNIQUET:  * No tourniquets in log *  DICTATION: DICTATION:   Patient was given intravenous sedation and positioned with his left arm on the arm board.  His hand and arm were prepped and draped with betadine scrub and Duraprep paint and sterile stockinet.  Left wrist was infiltrated with lidocaine and an incision was made over a length of 2 cm in line with the fourth ray.  The flexor retinaculum was incised and the carpal tunnel was decompressed.  Decompression was carried into the volar wrist and into the distal palm.  Hemostasis was assured. The wound was irrigated and closed with 3-0 nylon vertical mattress stitches and dressed with a sterile occlusive dressing with Adaptic, fluff gauze, Kerlix and cling wrap. Patient was taken to recovery in stable and satisfactory condition. Counts were correct at the end of the case.   PLAN OF CARE: Discharge to home after PACU  PATIENT DISPOSITION:  PACU - hemodynamically stable.   Delay start of Pharmacological VTE agent (>24hrs) due to surgical blood loss or risk of bleeding: yes

## 2021-08-01 NOTE — Transfer of Care (Signed)
Immediate Anesthesia Transfer of Care Note  Patient: William Lee  Procedure(s) Performed: Left carpal tunnel release (Left)  Patient Location: PACU  Anesthesia Type:MAC  Level of Consciousness: awake, alert  and oriented  Airway & Oxygen Therapy: Patient Spontanous Breathing and Patient connected to nasal cannula oxygen  Post-op Assessment: Report given to RN and Post -op Vital signs reviewed and stable  Post vital signs: Reviewed and stable  Last Vitals:  Vitals Value Taken Time  BP    Temp    Pulse 78 08/01/21 1343  Resp 18 08/01/21 1343  SpO2 100 % 08/01/21 1343  Vitals shown include unvalidated device data.  Last Pain:  Vitals:   08/01/21 1147  TempSrc:   PainSc: 0-No pain         Complications: No notable events documented.

## 2021-08-01 NOTE — H&P (Signed)
Patient ID:   [id number] Patient: William Lee  Date of Birth: [birth year] Visit Type: Office Visit   Date: 06/22/2021 01:45 PM Provider: Marchia Meiers. Vertell Limber MD   This 75 year old male presents for back/neck pain.  HISTORY OF PRESENT ILLNESS: 1.  back/neck pain  06/10/2021 right carpal tunnel release  Patient returns for suture removal, reporting improvement in symptoms.  He tolerates suture removal without complaint.  Site remains well approximated without bleeding.      Medical/Surgical/Interim History Reviewed, no change.  Last detailed document date:12/20/2020.     Family History: Reviewed, no changes.  Last detailed document date:12/20/2020.   Social History: Reviewed, no changes. Last detailed document date: 12/20/2020.    MEDICATIONS: (added, continued or stopped this visit) Started Medication Directions Instruction Stopped  albuterol sulfate HFA 90 mcg/actuation aerosol inhaler inhale 2 puff by inhalation route  every 4 - 6 hours as needed    Aspirin Low Dose 81 mg tablet,delayed release take 1 tablet by oral route  every day    atorvastatin 20 mg tablet take 1 tablet by oral route  every day    budesonide-formoterol HFA 160 mcg-4.5 mcg/actuation aerosol inhaler     cyclobenzaprine 10 mg tablet     diltiazem ER 120 mg capsule,extended release 12 hr take 1 capsule by oral route 2 times every day    omeprazole 20 mg tablet,delayed release take 2 capsule by oral route 2 times every day    OXYGEN     potassium chloride ER 20 mEq tablet,extended release take 1 tablet by oral route  every day    Spiriva with HandiHaler 18 mcg and inhalation capsules inhale 1 capsule by inhalation route  every day using 2 inhalations via handihaler    triamterene 37.5 mg-hydrochlorothiazide 25 mg tablet take 1 tablet by oral route  every day    valsartan 40 mg tablet     Wixela Inhub 100 mcg-50 mcg/dose powder for  inhalation       ALLERGIES: Ingredient Reaction Medication Name Comment ADHESIVE TAPE     Reviewed, no changes.    PHYSICAL EXAM:  Vitals Date Temp F BP Pulse Ht In Wt Lb BMI BSA Pain Score 06/22/2021  126/51 85 62 101.2 18.51  7/10     IMPRESSION:  William Lee is in good spirits, accompanied by his wife.  PLAN: Patient wishes to proceed with left carpal tunnel release.  This will be scheduled at [facility] October the 6. Two week follow-up for suture removal.    Orders: Instruction(s)/Education: Assessment Instruction I10 Lifestyle education Z68.1 Lifestyle education regarding diet  Completed Orders (this encounter) Order Details Reason Side Interpretation Result Initial Treatment Date Region Lifestyle education Patient will follow up with primary care physician.       Lifestyle education regarding diet Encouraged patient to eat well balanced diet.        Assessment/Plan  # Detail Type Description  1. Assessment Bilateral carpal tunnel syndrome (G56.03).     2. Assessment Essential (primary) hypertension (I10).     3. Assessment Body mass index (BMI) 19.9 or less, adult (Z68.1).  Plan Orders Today's instructions / counseling include(s) Lifestyle education regarding diet. Clinical information/comments: Encouraged patient to eat well balanced diet.       Pain Management Plan Pain Scale: 7/10. Method: Numeric Pain Intensity Scale. Location: back/neck. Onset: 06/30/2020. Duration: varies. Quality: discomforting. Pain management follow-up plan of care: Patient will continue medication management.Marland Kitchen  Provider:  Marchia Meiers. Vertell Limber MD  06/23/2021 09:35 AM    Dictation edited by: Mike Craze. Poteat RN    CC Providers: Lebam Ruhen[place],  [place]  [id number]-   Wilson Elkins  [facility] [place], [place] [address]-               Electronically signed by Marchia Meiers. Vertell Limber MD on 06/23/2021 05:12 PM

## 2021-08-01 NOTE — Interval H&P Note (Signed)
History and Physical Interval Note:  08/01/2021 12:22 PM  William Lee  has presented today for surgery, with the diagnosis of Bilateral carpal tunnel syndrome.  The various methods of treatment have been discussed with the patient and family. After consideration of risks, benefits and other options for treatment, the patient has consented to  Procedure(s): Left carpal tunnel release (Left) as a surgical intervention.  The patient's history has been reviewed, patient examined, no change in status, stable for surgery.  I have reviewed the patient's chart and labs.  Questions were answered to the patient's satisfaction.     Peggyann Shoals

## 2021-08-02 ENCOUNTER — Encounter (HOSPITAL_COMMUNITY): Payer: Self-pay | Admitting: Neurosurgery

## 2021-08-28 ENCOUNTER — Encounter: Payer: Self-pay | Admitting: Cardiovascular Disease

## 2021-08-28 NOTE — Progress Notes (Signed)
Cardiology Office Note   Date:  Sep 05, 2021   ID:  William Lee, DOB Oct 29, 1946, MRN [id number]  PCP:  Leonard Downing, MD  Cardiologist: Darlin Coco MD - now Kilyn Maragh   Problem list 1. Coronary artery disease-status post inferior wall myocardial infarction - CABG 1996.  2.  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  Hyperlipidemia 4.  PVD (Dr.  Donnetta Hutching)    Chief Complaint  Patient presents with   Coronary Artery Disease           05/16/17  William Lee is a 75 y.o. male who presents for a six-month follow-up visit Seen for the first time today .  Transfer from [place] No angina Has chronic dyspnea  Feb.  11, 2019: S/p CABG ,   Hx of COPD , is on home O2, 2 liters / min  No CP ,  Chronic shortness of breath   July 09, 2018: William Lee is seen today for follow-up visit. He has a history of an inferior wall myocardial infarction in 1996.  He has a history of coronary artery bypass grafting.  He also has a history of severe COPD and is chronically short of breath. He is on Cardizem - EF is 40-45%   - EF increased slightly with the addition of the Losartan .  Ideally , we would not have him on Cardizem but he cannot take beta blockers. Due to his severe COPD .    Oct. 30, 2020  William Lee is Seen today for follow-up visit.  He has a history of coronary artery disease and also severe COPD.  He has mildly reduced left ventricular systolic function.  He is on Cardizem to help suppress his fast heart rate.  He does not tolerate beta-blockers.  More short of breath  Uses his home O2 at night and has a portable which he takes with him when he is out and about.  Sees pulmonary  No angina  Able to do normal activities.  Does not exercise  Able to do some yard work    Nov. 4. 2021: William Lee is seen today for follow up of his CAD ., PAD  Has COPD . Was diagnosed with lung cancer last month,  Had quit smoking years ago  Does not have a diagnosis yet,  Would need a partial pneumonectomy  which his doctor does not think he would tolerate.   Getting repeat CT scan in Jan. 2022.  Is very stable from a cardiac standpont.  No angina . He is at low risk from a cardiac standpoint to have any biopsy or procedure needed.  Has multiple family members have died of cancer.  Wt = 101  05-Sep-2021: William Lee is seen today for follow up of his CAD , PAD , COPD  Was diagnosed with lung cancer a year ago  Wt is 104 lbs  Had XRT  - no tissue diagnosis due to the location of the mass. Treating empirically with XRT  Has an appt tomorrow to meet with oncology  No CP ,  chronic dyspnea.      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   bilateral hearing aids used   Hyperlipidemia    Hypertension    Ischemic heart disease    left Inguinal hernia 02/01/2012   Repaired 05/01/12    MI (myocardial infarction) ([place])    Mixed hyperlipidemia 05/16/2017   Papilloma of larynx    PONV (postoperative nausea and vomiting)    Shortness of breath 11/11/2011   Oxygen @ 2L via [place] prn   SOB (shortness of breath) on exertion     Past Surgical History:  Procedure Laterality Date   CARDIAC CATHETERIZATION     CARPAL TUNNEL RELEASE Right 06/10/2021   Procedure: Right Carpal Tunnel Release;  Surgeon: Erline Levine, MD;  Location: [facility];  Service: Neurosurgery;  Laterality: Right;  RM 21   CARPAL TUNNEL RELEASE Left 08/01/2021   Procedure: Left carpal tunnel release;  Surgeon: Erline Levine, MD;  Location: [facility];  Service: Neurosurgery;  Laterality: Left;   CHOLECYSTECTOMY  12/1995   COLONOSCOPY WITH PROPOFOL  N/A 09/23/2013   Procedure: COLONOSCOPY WITH PROPOFOL;  Surgeon: Winfield Cunas., MD;  Location: [facility];  Service: Endoscopy;  Laterality: N/A;   CORONARY ARTERY BYPASS GRAFT     ESOPHAGOGASTRODUODENOSCOPY (EGD) WITH PROPOFOL N/A 09/23/2013   Procedure: ESOPHAGOGASTRODUODENOSCOPY (EGD) WITH PROPOFOL;  Surgeon: Winfield Cunas., MD;  Location: [facility];  Service: Endoscopy;  Laterality: N/A;   HERNIA REPAIR     INGUINAL HERNIA REPAIR  05/01/2012   Procedure: HERNIA REPAIR INGUINAL ADULT;  Surgeon: Haywood Lasso, MD;  Location: [facility];  Service: General;  Laterality: Left;  inguinal   papalomas virus removal  3/10 7/10 5/11 10/12   PERIPHERAL VASCULAR CATHETERIZATION N/A 04/19/2016   Procedure: Abdominal Aortogram w/Lower Extremity;  Surgeon: Rosetta Posner, MD;  Location: [facility];  Service: Cardiovascular;  Laterality: N/A;     Current Outpatient Medications  Medication Sig Dispense Refill   albuterol (PROVENTIL HFA;VENTOLIN HFA) 108 (90 BASE) MCG/ACT inhaler Inhale 2 puffs into the lungs every 6 (six) hours as needed for wheezing.     aspirin 81 MG tablet Take 81 mg by mouth daily.     atorvastatin (LIPITOR) 40 MG tablet Take 20 mg by mouth daily.      Camphor-Menthol-Methyl Sal (SALONPAS EX) Apply 1 patch topically 2 (two) times daily as needed (pain).     Cholecalciferol 50 MCG (2000 UT) TABS Take 2,000 Units by mouth daily.     diltiazem (CARDIZEM CD) 120 MG 24 hr capsule Take 120 mg by mouth at bedtime.     Doxylamine-DM (VICKS NYQUIL COUGH) 6.25-15 MG/15ML LIQD Take 15 mLs by mouth 2 (two) times daily as needed (congestion/sleep).     Fluticasone-Salmeterol (ADVAIR) 250-50 MCG/DOSE AEPB Inhale 1 puff into the lungs 2 (two) times daily. Per the [facility] PCP - Duggal Patient will complete Symbicort until bottle empty then will switch to wixela on 09/23/20     Ibuprofen 200 MG CAPS Take 600-800 mg by mouth every 6 (six) hours as needed  (pain/headache/fever).     omeprazole (PRILOSEC) 20 MG capsule Take 40 mg by mouth 2 (two) times daily.     OXYGEN Inhale 2 L into the lungs as needed (During activity).     polyvinyl alcohol (LIQUIFILM TEARS) 1.4 % ophthalmic solution Place 1 drop into both eyes as needed for dry eyes.     potassium chloride 20 MEQ/15ML (  10%) SOLN Take 20 mEq by mouth 2 (two) times daily.      predniSONE (DELTASONE) 20 MG tablet Take 20 mg by mouth daily as needed.     SPIRIVA HANDIHALER 18 MCG inhalation capsule Place 18 mcg into inhaler and inhale daily.     traMADol (ULTRAM) 50 MG tablet Take 1 tablet (50 mg total) by mouth every 6 (six) hours as needed for moderate pain. 30 tablet 0   triamterene-hydrochlorothiazide (MAXZIDE-25) 37.5-25 MG tablet Take 0.5 tablets by mouth daily. 45 tablet 3   valsartan (DIOVAN) 40 MG tablet TAKE 1 TABLET BY MOUTH EVERY DAY 90 tablet 3   No current facility-administered medications for this visit.    Allergies:   Silver and Tape    Social History:  The patient  reports that he quit smoking about 30 years ago. His smoking use included cigarettes. He has never used smokeless tobacco. He reports current drug use. Drug: Marijuana. He reports that he does not drink alcohol.   Family History:  The patient's family history includes Bone cancer in his father; Cancer in his brother; Liver cancer in his mother; Lung cancer in his sister.    ROS:  Please see the history of present illness.   Otherwise, review of systems are positive for none.   All other systems are reviewed and negative.   Physical Exam: Blood pressure 132/60, pulse 82, height 5\' 3"  (1.6 m), weight 104 lb 9.6 oz (47.4 kg), SpO2 98 %.  GEN:  thin, chronically ill appearing man HEENT: Normal NECK: No JVD; No carotid bruits LYMPHATICS: No lymphadenopathy CARDIAC: RRR , no murmurs, rubs, gallops RESPIRATORY:  Clear to auscultation without rales, wheezing or rhonchi  ABDOMEN: Soft, non-tender,  non-distended MUSCULOSKELETAL:  No edema; No deformity  SKIN: Warm and dry NEUROLOGIC:  Alert and oriented x 3   EKG:      Oct. 31, 2022   NSR at 82.   TWI inferiorly    Recent Labs: 06/06/2021: ALT 9 08/01/2021: BUN 18; Creatinine, Ser 1.20; Hemoglobin 10.7; Platelets 277; Potassium 4.0; Sodium 135    Lipid Panel    Component Value Date/Time   CHOL 164 08/29/2019 1105   TRIG 126 08/29/2019 1105   HDL 57 08/29/2019 1105   CHOLHDL 2.9 08/29/2019 1105   CHOLHDL 2.5 08/11/2015 1133   VLDL 11 08/11/2015 1133   LDLCALC 85 08/29/2019 1105      Wt Readings from Last 3 Encounters:  08/29/21 104 lb 9.6 oz (47.4 kg)  08/01/21 99 lb (44.9 kg)  06/10/21 99 lb (44.9 kg)     ASSESSMENT AND PLAN:  1.  CAD with Inf. Mi ,  CABG in 1996.   No angina     2.  Chronic systolic CHF:     Stable  Will need to reduce hsi Maxzide to 1/2 because of his low BP readings.   3. mild to moderate mitral regurgitation by echo.  4.  Severe COPD -  Has stopped smoking    6.  Hypercholesterolemia -  Current medicines are reviewed at length with the patient today.  The patient does not have concerns regarding medicines.  The following changes have been made:  no change  Labs/ tests ordered today include:   Orders Placed This Encounter  Procedures   EKG 12-Lead    I'll see him in 1 year .     Mertie Moores, MD  08/29/2021 2:52 PM    [facility]  [address],  [place], [place]  [address] Pager [phone] Phone: [phone]; Fax: [phone]

## 2021-08-29 ENCOUNTER — Encounter: Payer: Self-pay | Admitting: Cardiovascular Disease

## 2021-08-29 ENCOUNTER — Ambulatory Visit (INDEPENDENT_AMBULATORY_CARE_PROVIDER_SITE_OTHER): Payer: Medicare Other | Admitting: Cardiovascular Disease

## 2021-08-29 ENCOUNTER — Other Ambulatory Visit: Payer: Self-pay

## 2021-08-29 VITALS — BP 132/60 | HR 82 | Ht 63.0 in | Wt 104.6 lb

## 2021-08-29 DIAGNOSIS — J449 Chronic obstructive pulmonary disease, unspecified: Secondary | ICD-10-CM

## 2021-08-29 DIAGNOSIS — I2510 Atherosclerotic heart disease of native coronary artery without angina pectoris: Secondary | ICD-10-CM

## 2021-08-29 MED ORDER — TRIAMTERENE-HCTZ 37.5-25 MG PO TABS: 0.5000 | ORAL_TABLET | Freq: Every day | ORAL | 3 refills | Status: DC

## 2021-08-29 NOTE — Patient Instructions (Addendum)
Medication Instructions:  Your physician has recommended you make the following change in your medication:  DECREASE MAXZIDE 37.5-25 TO 1/2 TABLET DAILY.  *If you need a refill on your cardiac medications before your next appointment, please call your pharmacy*   Lab Work: NONE If you have labs (blood work) drawn today and your tests are completely normal, you will receive your results only by: [place] (if you have MyChart) OR A paper copy in the mail If you have any lab test that is abnormal or we need to change your treatment, we will call you to review the results.   Testing/Procedures: NONE   Follow-Up: At [facility], you and your health needs are our priority.  As part of our continuing mission to provide you with exceptional heart care, we have created designated Provider Care Teams.  These Care Teams include your primary Cardiologist (physician) and Advanced Practice Providers (APPs -  Physician Assistants and Nurse Practitioners) who all work together to provide you with the care you need, when you need it.  We recommend signing up for the patient portal called "MyChart".  Sign up information is provided on this After Visit Summary.  MyChart is used to connect with patients for Virtual Visits (Telemedicine).  Patients are able to view lab/test results, encounter notes, upcoming appointments, etc.  Non-urgent messages can be sent to your provider as well.   To learn more about what you can do with MyChart, go to [email or web address].    Your next appointment:   1 year(s)  The format for your next appointment:   In Person  Provider:   Mertie Moores, MD

## 2021-12-10 ENCOUNTER — Other Ambulatory Visit: Payer: Self-pay | Admitting: Cardiovascular Disease

## 2022-07-21 ENCOUNTER — Telehealth: Payer: Self-pay | Admitting: Oncology

## 2022-07-21 NOTE — Telephone Encounter (Signed)
Scheduled appt per 9/22 referral. Pt is aware of appt date and time. Pt is aware to arrive 15 mins prior to appt time and to bring and updated insurance card. Pt is aware of appt location.   

## 2022-08-11 ENCOUNTER — Inpatient Hospital Stay: Payer: Medicare Other

## 2022-08-11 ENCOUNTER — Inpatient Hospital Stay: Payer: Medicare Other | Attending: Oncology | Admitting: Oncology

## 2022-08-11 VITALS — BP 144/76 | HR 85 | Temp 97.7°F | Resp 15 | Ht 63.0 in | Wt 104.7 lb

## 2022-08-11 DIAGNOSIS — Z800 Family history of malignant neoplasm of digestive organs: Secondary | ICD-10-CM | POA: Diagnosis not present

## 2022-08-11 DIAGNOSIS — Z7982 Long term (current) use of aspirin: Secondary | ICD-10-CM | POA: Diagnosis not present

## 2022-08-11 DIAGNOSIS — Z85118 Personal history of other malignant neoplasm of bronchus and lung: Secondary | ICD-10-CM | POA: Diagnosis not present

## 2022-08-11 DIAGNOSIS — I252 Old myocardial infarction: Secondary | ICD-10-CM | POA: Diagnosis not present

## 2022-08-11 DIAGNOSIS — J4489 Other specified chronic obstructive pulmonary disease: Secondary | ICD-10-CM | POA: Diagnosis not present

## 2022-08-11 DIAGNOSIS — R0602 Shortness of breath: Secondary | ICD-10-CM

## 2022-08-11 DIAGNOSIS — I259 Chronic ischemic heart disease, unspecified: Secondary | ICD-10-CM

## 2022-08-11 DIAGNOSIS — K219 Gastro-esophageal reflux disease without esophagitis: Secondary | ICD-10-CM | POA: Diagnosis not present

## 2022-08-11 DIAGNOSIS — Z808 Family history of malignant neoplasm of other organs or systems: Secondary | ICD-10-CM | POA: Diagnosis not present

## 2022-08-11 DIAGNOSIS — D500 Iron deficiency anemia secondary to blood loss (chronic): Secondary | ICD-10-CM | POA: Diagnosis present

## 2022-08-11 DIAGNOSIS — I10 Essential (primary) hypertension: Secondary | ICD-10-CM | POA: Diagnosis not present

## 2022-08-11 DIAGNOSIS — Z79899 Other long term (current) drug therapy: Secondary | ICD-10-CM | POA: Insufficient documentation

## 2022-08-11 DIAGNOSIS — Z7951 Long term (current) use of inhaled steroids: Secondary | ICD-10-CM | POA: Diagnosis not present

## 2022-08-11 DIAGNOSIS — F17210 Nicotine dependence, cigarettes, uncomplicated: Secondary | ICD-10-CM | POA: Diagnosis not present

## 2022-08-11 DIAGNOSIS — Z9049 Acquired absence of other specified parts of digestive tract: Secondary | ICD-10-CM | POA: Insufficient documentation

## 2022-08-11 DIAGNOSIS — I2510 Atherosclerotic heart disease of native coronary artery without angina pectoris: Secondary | ICD-10-CM | POA: Insufficient documentation

## 2022-08-11 DIAGNOSIS — E785 Hyperlipidemia, unspecified: Secondary | ICD-10-CM | POA: Diagnosis not present

## 2022-08-11 DIAGNOSIS — J449 Chronic obstructive pulmonary disease, unspecified: Secondary | ICD-10-CM | POA: Diagnosis not present

## 2022-08-11 DIAGNOSIS — D649 Anemia, unspecified: Secondary | ICD-10-CM | POA: Insufficient documentation

## 2022-08-11 LAB — RETICULOCYTES
Immature Retic Fract: 11.9 % (ref 2.3–15.9)
RBC.: 3.63 MIL/uL — ABNORMAL LOW (ref 4.22–5.81)
Retic Count, Absolute: 70.8 10*3/uL (ref 19.0–186.0)
Retic Ct Pct: 2 % (ref 0.4–3.1)

## 2022-08-11 LAB — FERRITIN: Ferritin: 11 ng/mL — ABNORMAL LOW (ref 24–336)

## 2022-08-11 LAB — CMP (CANCER CENTER ONLY)
ALT: 14 U/L (ref 0–44)
AST: 17 U/L (ref 15–41)
Albumin: 4.1 g/dL (ref 3.5–5.0)
Alkaline Phosphatase: 84 U/L (ref 38–126)
Anion gap: 4 — ABNORMAL LOW (ref 5–15)
BUN: 18 mg/dL (ref 8–23)
CO2: 27 mmol/L (ref 22–32)
Calcium: 8.9 mg/dL (ref 8.9–10.3)
Chloride: 106 mmol/L (ref 98–111)
Creatinine: 1.24 mg/dL (ref 0.61–1.24)
GFR, Estimated: 60 mL/min — ABNORMAL LOW (ref >60)
Glucose, Bld: 107 mg/dL — ABNORMAL HIGH (ref 70–99)
Potassium: 4.3 mmol/L (ref 3.5–5.1)
Sodium: 137 mmol/L (ref 135–145)
Total Bilirubin: 0.9 mg/dL (ref 0.3–1.2)
Total Protein: 7 g/dL (ref 6.5–8.1)

## 2022-08-11 LAB — CBC WITH DIFFERENTIAL (CANCER CENTER ONLY)
Abs Immature Granulocytes: 0.11 10*3/uL — ABNORMAL HIGH (ref 0.00–0.07)
Basophils Absolute: 0.1 10*3/uL (ref 0.0–0.1)
Basophils Relative: 1 %
Eosinophils Absolute: 0.4 10*3/uL (ref 0.0–0.5)
Eosinophils Relative: 6 %
HCT: 33.6 % — ABNORMAL LOW (ref 39.0–52.0)
Hemoglobin: 10.8 g/dL — ABNORMAL LOW (ref 13.0–17.0)
Immature Granulocytes: 2 %
Lymphocytes Relative: 9 %
Lymphs Abs: 0.6 10*3/uL — ABNORMAL LOW (ref 0.7–4.0)
MCH: 29.6 pg (ref 26.0–34.0)
MCHC: 32.1 g/dL (ref 30.0–36.0)
MCV: 92.1 fL (ref 80.0–100.0)
Monocytes Absolute: 0.4 10*3/uL (ref 0.1–1.0)
Monocytes Relative: 6 %
Neutro Abs: 5.3 10*3/uL (ref 1.7–7.7)
Neutrophils Relative %: 76 %
Platelet Count: 295 10*3/uL (ref 150–400)
RBC: 3.65 MIL/uL — ABNORMAL LOW (ref 4.22–5.81)
RDW: 15.4 % (ref 11.5–15.5)
WBC Count: 6.9 10*3/uL (ref 4.0–10.5)
nRBC: 0 % (ref 0.0–0.2)

## 2022-08-11 LAB — VITAMIN B12: Vitamin B-12: 367 pg/mL (ref 180–914)

## 2022-08-11 LAB — FOLATE: Folate: 14.1 ng/mL (ref >5.9)

## 2022-08-11 LAB — DIRECT ANTIGLOBULIN TEST (NOT AT ARMC)
DAT, IgG: NEGATIVE
DAT, complement: NEGATIVE

## 2022-08-11 NOTE — Progress Notes (Signed)
[facility] Initial Visit:  Patient Care Team: Leonard Downing, MD as PCP - General (Family Medicine) Nahser, Wonda Cheng, MD as PCP - Cardiology (Cardiology) Delma Freeze, Raylene Miyamoto, MD as Referring Physician (Pulmonary Disease) Jeanine Luz, MD as Referring Physician (Otolaryngology) [facility] [place] as Referring Physician ([place]) [place] as Referring Physician ([place]) Barbee Cough, MD as Attending Physician (Hematology)  CHIEF COMPLAINTS/PURPOSE OF CONSULTATION:  Oncology History   No history exists.    HISTORY OF PRESENTING ILLNESS: William Lee 76 y.o. male is here because of anemia Medical history notable for coronary artery disease, history of MI, COPD, papilloma of larynx, GERD, carpal tunnel syndrome History of lung cancer  June 29, 2022: WBC 5.7 hemoglobin 11.1 MCV 90 platelet count 288; 72 segs 10 lymphs 9 monos 8 eos 1 basophil.  Reticulocyte count 1.7.  Iron saturation 10%  August 11 2022:  [facility] Consult  Lung cancer was treated at [facility] in August 2022 with SBRT.  He believes that he has been anemic for quite some time Patient is on supplemental O2 at 2 lit [place] which he uses when he is walking or sleeping.   Taking oral iron three times daily.  On ASA 81 mg daily.  Has never had PRBC's or IV iron.  Takes ibuprofen fairly often  Social:  Married.  Worked on a [employer].  Did spray painting.  Smoked up to 3 ppd of cigarettes until 1992 when noted increased SOB.  EtOH rare in the past  [facility] Mother died 68 liver cancer, occular melanoma Father died 69 cancer Brother died 63 cancer Sister died 32 lung cancer   Review of Systems  Constitutional:  Positive for appetite change, fatigue and unexpected weight change. Negative for chills and fever.       Has been steadily loosing weight  HENT:   Positive for trouble swallowing. Negative for mouth sores,  nosebleeds, sore throat and voice change.        Sometimes has trouble swallowing solids  Eyes:  Negative for eye problems and icterus.       Vision changes:  None  Respiratory:  Positive for cough, shortness of breath and wheezing. Negative for chest tightness and hemoptysis.        Usually has dry cough  Cardiovascular:  Negative for chest pain, leg swelling and palpitations.       PND:  none Orthopnea:  none  Gastrointestinal:  Positive for constipation. Negative for abdominal pain, blood in stool, diarrhea, nausea and vomiting.       Stools dark due to oral iron Has cramping due to constipation  Endocrine: Negative for hot flashes.       Cold intolerance    Genitourinary:  Negative for bladder incontinence, difficulty urinating, dysuria, frequency and hematuria.        Occasional nocturia up to 3 times  Musculoskeletal:  Positive for arthralgias, back pain and myalgias. Negative for gait problem, neck pain and neck stiffness.  Skin:  Positive for itching. Negative for rash and wound.       Itching on back and legs  Neurological:  Positive for light-headedness and numbness. Negative for dizziness, extremity weakness, gait problem, headaches and speech difficulty.       Neuropathy of hands  Hematological:  Negative for adenopathy. Bruises/bleeds easily.  Psychiatric/Behavioral:  Negative for sleep disturbance and suicidal ideas. The patient is not nervous/anxious.     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   bilateral hearing aids used   Hyperlipidemia     Hypertension    Ischemic heart disease    left Inguinal hernia 02/01/2012   Repaired 05/01/12    MI (myocardial infarction) ([place])    Mixed hyperlipidemia 05/16/2017   Papilloma of larynx    PONV (postoperative nausea and vomiting)    Shortness of breath 11/11/2011   Oxygen @ 2L via [place] prn   SOB (shortness of breath) on exertion     SURGICAL HISTORY: Past Surgical History:  Procedure Laterality Date   CARDIAC CATHETERIZATION     CARPAL TUNNEL RELEASE Right 06/10/2021   Procedure: Right Carpal Tunnel Release;  Surgeon: Erline Levine, MD;  Location: [place];  Service: Neurosurgery;  Laterality: Right;  RM 21   CARPAL TUNNEL RELEASE Left 08/01/2021   Procedure: Left carpal tunnel release;  Surgeon: Erline Levine, MD;  Location: [place];  Service: Neurosurgery;  Laterality: Left;   CHOLECYSTECTOMY  12/1995   COLONOSCOPY WITH PROPOFOL N/A 09/23/2013   Procedure: COLONOSCOPY WITH PROPOFOL;  Surgeon: Winfield Cunas., MD;  Location: [facility];  Service: Endoscopy;  Laterality: N/A;   CORONARY ARTERY BYPASS GRAFT     ESOPHAGOGASTRODUODENOSCOPY (EGD) WITH PROPOFOL N/A 09/23/2013   Procedure: ESOPHAGOGASTRODUODENOSCOPY (EGD) WITH PROPOFOL;  Surgeon: Winfield Cunas., MD;  Location: [facility];  Service: Endoscopy;  Laterality: N/A;   HERNIA REPAIR     INGUINAL HERNIA REPAIR  05/01/2012   Procedure: HERNIA REPAIR INGUINAL ADULT;  Surgeon: Haywood Lasso, MD;  Location: [place];  Service: General;  Laterality: Left;  inguinal   papalomas virus removal  3/10 7/10 5/11 10/12   PERIPHERAL VASCULAR CATHETERIZATION N/A 04/19/2016   Procedure: Abdominal Aortogram w/Lower Extremity;  Surgeon: Rosetta Posner, MD;  Location: [facility];  Service: Cardiovascular;  Laterality: N/A;    SOCIAL HISTORY: Social History   Socioeconomic History   Marital status: Married    Spouse name: Not on file   Number of children: Not on file   Years of education: Not on file   Highest  education level: Not on file  Occupational History   Not on file  Tobacco Use   Smoking status: Former    Types: Cigarettes    Quit date: 10/30/1990    Years since quitting: 31.8   Smokeless tobacco: Never  Vaping Use   Vaping Use: Never used  Substance and Sexual Activity   Alcohol use: No   Drug use: Yes    Types: Marijuana    Comment: Last use 06/25/21   Sexual activity: Not on file  Other Topics Concern   Not on file  Social History Narrative   Not on file   Social Determinants of Health   Financial Resource Strain: Not on file  Food Insecurity: Not on file  Transportation Needs: Not on file  Physical Activity: Not on file  Stress: Not on file  Social Connections: Not on file  Intimate Partner Violence: Not on  file    FAMILY HISTORY Family History  Problem Relation Age of Onset   Liver cancer Mother    Bone cancer Father    Lung cancer Sister    Cancer Brother        unknown    ALLERGIES:  is allergic to silver and tape.  MEDICATIONS:  Current Outpatient Medications  Medication Sig Dispense Refill   albuterol (PROVENTIL HFA;VENTOLIN HFA) 108 (90 BASE) MCG/ACT inhaler Inhale 2 puffs into the lungs every 6 (six) hours as needed for wheezing.     aspirin 81 MG tablet Take 81 mg by mouth daily.     atorvastatin (LIPITOR) 40 MG tablet Take 20 mg by mouth daily.      Camphor-Menthol-Methyl Sal (SALONPAS EX) Apply 1 patch topically 2 (two) times daily as needed (pain).     Cholecalciferol 50 MCG (2000 UT) TABS Take 2,000 Units by mouth daily.     diltiazem (CARDIZEM CD) 120 MG 24 hr capsule Take 120 mg by mouth at bedtime.     Doxylamine-DM (VICKS NYQUIL COUGH) 6.25-15 MG/15ML LIQD Take 15 mLs by mouth 2 (two) times daily as needed (congestion/sleep).     Fluticasone-Salmeterol (ADVAIR) 250-50 MCG/DOSE AEPB Inhale 1 puff into the lungs 2 (two) times daily. Per the [place] PCP - [place] Patient will complete Symbicort until bottle empty then will switch to wixela on 09/23/20      Ibuprofen 200 MG CAPS Take 600-800 mg by mouth every 6 (six) hours as needed (pain/headache/fever).     omeprazole (PRILOSEC) 20 MG capsule Take 40 mg by mouth 2 (two) times daily.     OXYGEN Inhale 2 L into the lungs as needed (During activity).     polyvinyl alcohol (LIQUIFILM TEARS) 1.4 % ophthalmic solution Place 1 drop into both eyes as needed for dry eyes.     potassium chloride 20 MEQ/15ML (10%) SOLN Take 20 mEq by mouth 2 (two) times daily.      predniSONE (DELTASONE) 20 MG tablet Take 20 mg by mouth daily as needed.     SPIRIVA HANDIHALER 18 MCG inhalation capsule Place 18 mcg into inhaler and inhale daily.     traMADol (ULTRAM) 50 MG tablet Take 1 tablet (50 mg total) by mouth every 6 (six) hours as needed for moderate pain. 30 tablet 0   valsartan (DIOVAN) 40 MG tablet TAKE 1 TABLET BY MOUTH EVERY DAY 90 tablet 2   No current facility-administered medications for this visit.    PHYSICAL EXAMINATION:  ECOG PERFORMANCE STATUS: 1 - Symptomatic but completely ambulatory   Vitals:   08/11/22 1301  BP: (!) 144/76  Pulse: 85  Resp: 15  Temp: 97.7 F (36.5 C)  SpO2: 97%    Filed Weights   08/11/22 1301  Weight: 104 lb 11.2 oz (47.5 kg)     Physical Exam Vitals and nursing note reviewed.  Constitutional:      Appearance: Normal appearance. He is ill-appearing. He is not toxic-appearing or diaphoretic.     Comments: Here with wife.  Thin.  Using O2 via [place]  HENT:     Head: Normocephalic and atraumatic.     Right Ear: External ear normal.     Left Ear: External ear normal.     Nose: Nose normal.  Eyes:     Conjunctiva/sclera: Conjunctivae normal.     Pupils: Pupils are equal, round, and reactive to light.  Cardiovascular:     Rate and Rhythm: Normal rate and regular rhythm.  Heart sounds: Normal heart sounds. No murmur heard.    No friction rub. No gallop.     Comments: Well healed sternotomy scar Chest rachitic Pulmonary:     Effort: Pulmonary effort is  normal. No respiratory distress.     Breath sounds: No stridor. No rhonchi.     Comments: BS distant Abdominal:     General: Abdomen is flat. There is no distension.     Palpations: Abdomen is soft. There is no mass.     Tenderness: There is no abdominal tenderness. There is no guarding or rebound.  Musculoskeletal:     Cervical back: Normal range of motion and neck supple. No rigidity or tenderness.     Comments: Decreased muscle mass throughout  Lymphadenopathy:     Head:     Right side of head: No submental, submandibular, tonsillar, preauricular, posterior auricular or occipital adenopathy.     Left side of head: No submental, submandibular, tonsillar, preauricular, posterior auricular or occipital adenopathy.     Cervical: No cervical adenopathy.     Right cervical: No superficial, deep or posterior cervical adenopathy.    Left cervical: No superficial, deep or posterior cervical adenopathy.     Upper Body:     Right upper body: No supraclavicular or axillary adenopathy.     Left upper body: No supraclavicular or axillary adenopathy.     Lower Body: No right inguinal adenopathy. No left inguinal adenopathy.  Skin:    Coloration: Skin is pale. Skin is not jaundiced.     Findings: No bruising, erythema, lesion or rash.  Neurological:     General: No focal deficit present.     Mental Status: He is alert and oriented to person, place, and time. Mental status is at baseline.     Cranial Nerves: No cranial nerve deficit.  Psychiatric:        Mood and Affect: Mood normal.        Behavior: Behavior normal.        Thought Content: Thought content normal.        Judgment: Judgment normal.     LABORATORY DATA: I have personally reviewed the data as listed:  Appointment on 08/11/2022  Component Date Value Ref Range Status   Zinc 08/11/2022 73  44 - 115 ug/dL Final   Comment: (NOTE) This test was developed and its performance characteristics determined by [facility]. It has not been  cleared or approved by the Food and Drug Administration.                                Detection Limit = 5 Performed At: [facility] [place], [place] [id number] Rush Farmer MD [id number]    Vitamin B-12 08/11/2022 367  180 - 914 pg/mL Final   Comment: (NOTE) This assay is not validated for testing neonatal or myeloproliferative syndrome specimens for Vitamin B12 levels. Performed at [facility], [place] [address], [place], [place] [address]    Retic Ct Pct 08/11/2022 2.0  0.4 - 3.1 % Final   RBC. 08/11/2022 3.63 (L)  4.22 - 5.81 MIL/uL Final   Retic Count, Absolute 08/11/2022 70.8  19.0 - 186.0 K/uL Final   Immature Retic Fract 08/11/2022 11.9  2.3 - 15.9 % Final   Performed at [facility], [place] [address] [place], [place] [address]   IgG (Immunoglobin G), Serum 08/11/2022 900  603 - [phone]  mg/dL Final   IgA 08/11/2022 165  61 - 437 mg/dL Final   IgM (Immunoglobulin M), Srm 08/11/2022 42  15 - 143 mg/dL Final   Total Protein ELP 08/11/2022 6.4  6.0 - 8.5 g/dL Corrected   Albumin SerPl Elph-Mcnc 08/11/2022 3.6  2.9 - 4.4 g/dL Corrected   Alpha 1 08/11/2022 0.3  0.0 - 0.4 g/dL Corrected   Alpha2 Glob SerPl Elph-Mcnc 08/11/2022 0.8  0.4 - 1.0 g/dL Corrected   B-Globulin SerPl Elph-Mcnc 08/11/2022 0.9  0.7 - 1.3 g/dL Corrected   Gamma Glob SerPl Elph-Mcnc 08/11/2022 0.9  0.4 - 1.8 g/dL Corrected   M Protein SerPl Elph-Mcnc 08/11/2022 Not Observed  Not Observed g/dL Corrected   Globulin, Total 08/11/2022 2.8  2.2 - 3.9 g/dL Corrected   Albumin/Glob SerPl 08/11/2022 1.3  0.7 - 1.7 Corrected   IFE 1 08/11/2022 Comment   Corrected   Comment: (NOTE) The immunofixation pattern appears unremarkable. Evidence of monoclonal protein is not apparent.    Please Note 08/11/2022 Comment   Corrected   Comment: (NOTE) Protein electrophoresis scan will follow via computer, mail, or courier delivery. Performed At: [facility] [place], [place] [id number] Rush Farmer MD [id number]    Kappa free light chain 08/11/2022 52.0 (H)  3.3 - 19.4 mg/L Final   Lambda free light chains 08/11/2022 25.3  5.7 - 26.3 mg/L Final   Kappa, lambda light chain ratio 08/11/2022 2.06 (H)  0.26 - 1.65 Final   Comment: (NOTE) Performed At: [facility] [address] [place], [place] [id number] Rush Farmer MD FU:[id number]    Haptoglobin 08/11/2022 232  34 - 355 mg/dL Final   Comment: (NOTE) Performed At: [facility] [place], [place] [id number] Rush Farmer MD [id number]    Folate 08/11/2022 14.1  >5.9 ng/mL Final   Performed at [facility], [place] [address] [place], [place] [address]   Ferritin 08/11/2022 11 (L)  24 - 336 ng/mL Final   Performed at [facility], [address] [place], [place] [address]   DAT, complement 08/11/2022 NEG   Final   DAT, IgG 08/11/2022    Final                   Value:NEG Performed at [facility], [place] [address] [place], [place] [address]    Copper 08/11/2022 123  69 - 132 ug/dL Final   Comment: (NOTE) This test was developed and its performance characteristics determined by [facility]. It has not been cleared or approved by the Food and Drug Administration.                                Detection Limit = 5 Performed At: [facility] [place], [place] [id number] Rush Farmer MD FG:[id number]    Sodium 08/11/2022 137  135 - 145 mmol/L Final   Potassium 08/11/2022 4.3  3.5 - 5.1 mmol/L Final   Chloride 08/11/2022 106  98 - 111 mmol/L Final   CO2 08/11/2022 27  22 - 32 mmol/L Final   Glucose, Bld 08/11/2022 107 (H)  70 - 99 mg/dL Final   Glucose reference range applies only to samples taken after fasting for at least 8 hours.   BUN 08/11/2022 18  8 - 23 mg/dL Final   Creatinine 08/11/2022 1.24  0.61 - 1.24 mg/dL Final   Calcium 08/11/2022  8.9  8.9 -  10.3 mg/dL Final   Total Protein 08/11/2022 7.0  6.5 - 8.1 g/dL Final   Albumin 08/11/2022 4.1  3.5 - 5.0 g/dL Final   AST 08/11/2022 17  15 - 41 U/L Final   ALT 08/11/2022 14  0 - 44 U/L Final   Alkaline Phosphatase 08/11/2022 84  38 - 126 U/L Final   Total Bilirubin 08/11/2022 0.9  0.3 - 1.2 mg/dL Final   GFR, Estimated 08/11/2022 60 (L)  >60 mL/min Final   Comment: (NOTE) Calculated using the CKD-EPI Creatinine Equation (2021)    Anion gap 08/11/2022 4 (L)  5 - 15 Final   Performed at [facility], [place] [address] [place], [place] [address]   WBC Count 08/11/2022 6.9  4.0 - 10.5 K/uL Final   RBC 08/11/2022 3.65 (L)  4.22 - 5.81 MIL/uL Final   Hemoglobin 08/11/2022 10.8 (L)  13.0 - 17.0 g/dL Final   HCT 08/11/2022 33.6 (L)  39.0 - 52.0 % Final   MCV 08/11/2022 92.1  80.0 - 100.0 fL Final   MCH 08/11/2022 29.6  26.0 - 34.0 pg Final   MCHC 08/11/2022 32.1  30.0 - 36.0 g/dL Final   RDW 08/11/2022 15.4  11.5 - 15.5 % Final   Platelet Count 08/11/2022 295  150 - 400 K/uL Final   nRBC 08/11/2022 0.0  0.0 - 0.2 % Final   Neutrophils Relative % 08/11/2022 76  % Final   Neutro Abs 08/11/2022 5.3  1.7 - 7.7 K/uL Final   Lymphocytes Relative 08/11/2022 9  % Final   Lymphs Abs 08/11/2022 0.6 (L)  0.7 - 4.0 K/uL Final   Monocytes Relative 08/11/2022 6  % Final   Monocytes Absolute 08/11/2022 0.4  0.1 - 1.0 K/uL Final   Eosinophils Relative 08/11/2022 6  % Final   Eosinophils Absolute 08/11/2022 0.4  0.0 - 0.5 K/uL Final   Basophils Relative 08/11/2022 1  % Final   Basophils Absolute 08/11/2022 0.1  0.0 - 0.1 K/uL Final   Immature Granulocytes 08/11/2022 2  % Final   Abs Immature Granulocytes 08/11/2022 0.11 (H)  0.00 - 0.07 K/uL Final   Performed at [facility], [place] [address], [place], [place] [address]    RADIOGRAPHIC STUDIES: I have personally reviewed the radiological images as listed and agree with the findings in the  report  No results found.  ASSESSMENT/PLAN   76 y.o. male with medical history notable for coronary artery disease, history of MI, COPD, papilloma of larynx, GERD, carpal tunnel syndrome, history of lung cancer and tobacco use who is seen for management of anemia  Anemia:  Likely multifactorial.  Obtain CBC with diff, CMP, retic count, Ferritin B12, folate, DAT, Haptoglobin, SPEP with IEP, free light chains and smear for morphology review.    Therapeutics:   Patient has symptomatic anemia with Hgb < 10 and cardiopulmonary disease.  He has has not responded to oral iron therefore we will will arrange for IV iron replacement.   Given the severe symptoms we prefer to replete iron stores in one or two visits rather than over the course of several months.  In addition ongoing blood loss exceeds the capacity of oral iron to meet needs. A discussion regarding risks was had with the patient.  IV iron has the potential to cause allergic reactions, including potentially life-threatening anaphylaxis.   IV iron may be associated with non-allergic infusion reactions including self-limiting urticaria, palpitations, dizziness, and neck and back spasm; generally,  these occur in <1 percent of individuals and do not progress to more serious reactions. The non-allergic reaction consisting of flushing of the face and myalgias of the chest and back.   After discussion of the risks and benefits of IV iron therapy patient has elected to proceed with parenteral iron therapy.    Lung cancer:  treated at [facility] in August 2022 with SBRT  History of tobacco use:  Smoked up to 3 ppd of cigarettes until 1992 when noted increased SOB   Cancer Staging  No matching staging information was found for the patient.   No problem-specific Assessment & Plan notes found for this encounter.   Orders Placed This Encounter  Procedures   CBC with Differential (Cancer Center Only)    Standing Status:   Future    Number of Occurrences:    1    Standing Expiration Date:   08/12/2023   CMP ([place] only)    Standing Status:   Future    Number of Occurrences:   1    Standing Expiration Date:   08/12/2023   Copper, serum    Standing Status:   Future    Number of Occurrences:   1    Standing Expiration Date:   08/12/2023   Ferritin    Standing Status:   Future    Number of Occurrences:   1    Standing Expiration Date:   08/12/2023   Folate    Standing Status:   Future    Number of Occurrences:   1    Standing Expiration Date:   08/12/2023   Haptoglobin    Standing Status:   Future    Number of Occurrences:   1    Standing Expiration Date:   08/12/2023   Kappa/lambda light chains    Standing Status:   Future    Number of Occurrences:   1    Standing Expiration Date:   08/12/2023   Multiple Myeloma Panel (SPEP&IFE w/QIG)    Standing Status:   Future    Number of Occurrences:   1    Standing Expiration Date:   08/12/2023   Reticulocytes    Standing Status:   Future    Number of Occurrences:   1    Standing Expiration Date:   08/12/2023   Vitamin B12    Standing Status:   Future    Number of Occurrences:   1    Standing Expiration Date:   08/12/2023   Zinc    Standing Status:   Future    Number of Occurrences:   1    Standing Expiration Date:   08/12/2023   Direct antiglobulin test (not at [facility])    Standing Status:   Future    Number of Occurrences:   1    Standing Expiration Date:   08/12/2023    All questions were answered. The patient knows to call the clinic with any problems, questions or concerns.  This note was electronically signed.    Barbee Cough, MD  08/22/2022 11:26 AM

## 2022-08-13 LAB — HAPTOGLOBIN: Haptoglobin: 232 mg/dL (ref 34–355)

## 2022-08-14 ENCOUNTER — Encounter: Payer: Self-pay | Admitting: Oncology

## 2022-08-14 ENCOUNTER — Telehealth: Payer: Self-pay | Admitting: Oncology

## 2022-08-14 LAB — KAPPA/LAMBDA LIGHT CHAINS
Kappa free light chain: 52 mg/L — ABNORMAL HIGH (ref 3.3–19.4)
Kappa, lambda light chain ratio: 2.06 — ABNORMAL HIGH (ref 0.26–1.65)
Lambda free light chains: 25.3 mg/L (ref 5.7–26.3)

## 2022-08-14 NOTE — Telephone Encounter (Signed)
Scheduled appt per 10/13 los. Pt is aware.

## 2022-08-16 ENCOUNTER — Telehealth: Payer: Self-pay

## 2022-08-16 LAB — MULTIPLE MYELOMA PANEL, SERUM
Albumin SerPl Elph-Mcnc: 3.6 g/dL (ref 2.9–4.4)
Albumin/Glob SerPl: 1.3 (ref 0.7–1.7)
Alpha 1: 0.3 g/dL (ref 0.0–0.4)
Alpha2 Glob SerPl Elph-Mcnc: 0.8 g/dL (ref 0.4–1.0)
B-Globulin SerPl Elph-Mcnc: 0.9 g/dL (ref 0.7–1.3)
Gamma Glob SerPl Elph-Mcnc: 0.9 g/dL (ref 0.4–1.8)
Globulin, Total: 2.8 g/dL (ref 2.2–3.9)
IgA: 165 mg/dL (ref 61–437)
IgG (Immunoglobin G), Serum: 900 mg/dL (ref 603–1613)
IgM (Immunoglobulin M), Srm: 42 mg/dL (ref 15–143)
Total Protein ELP: 6.4 g/dL (ref 6.0–8.5)

## 2022-08-16 NOTE — Telephone Encounter (Signed)
This nurse reached out to patient and made aware that provider has reviewed his labs and has placed orders for him to receive IV iron.  Advised that the schedulers should be giving him a call to set those appointments.  Patient acknowledged understanding. No questions or concerns at this time

## 2022-08-18 ENCOUNTER — Telehealth: Payer: Self-pay | Admitting: Oncology

## 2022-08-18 NOTE — Telephone Encounter (Signed)
Scheduled appts per 10/20 staff msg from [employer]. Called pt, no answer. Left msg with appts date/times.

## 2022-08-20 LAB — ZINC: Zinc: 73 ug/dL (ref 44–115)

## 2022-08-20 LAB — COPPER, SERUM: Copper: 123 ug/dL (ref 69–132)

## 2022-08-21 ENCOUNTER — Inpatient Hospital Stay: Payer: Medicare Other

## 2022-08-21 ENCOUNTER — Other Ambulatory Visit: Payer: Self-pay

## 2022-08-21 VITALS — BP 130/60 | HR 69 | Temp 97.6°F | Resp 18

## 2022-08-21 DIAGNOSIS — I2510 Atherosclerotic heart disease of native coronary artery without angina pectoris: Secondary | ICD-10-CM

## 2022-08-21 DIAGNOSIS — D500 Iron deficiency anemia secondary to blood loss (chronic): Secondary | ICD-10-CM | POA: Diagnosis not present

## 2022-08-21 MED ORDER — SODIUM CHLORIDE 0.9 % IV SOLN: Freq: Once | INTRAVENOUS | Status: AC

## 2022-08-21 MED ORDER — ACETAMINOPHEN 325 MG PO TABS
650.0000 mg | ORAL_TABLET | Freq: Once | ORAL | Status: AC
  Administered 2022-08-21: 650 mg via ORAL
  Filled 2022-08-21: qty 2

## 2022-08-21 MED ORDER — LORATADINE 10 MG PO TABS
10.0000 mg | ORAL_TABLET | Freq: Once | ORAL | Status: AC
  Administered 2022-08-21: 10 mg via ORAL

## 2022-08-21 MED ORDER — SODIUM CHLORIDE 0.9 % IV SOLN
510.0000 mg | Freq: Once | INTRAVENOUS | Status: AC
  Administered 2022-08-21: 510 mg via INTRAVENOUS
  Filled 2022-08-21: qty 17

## 2022-08-21 MED ORDER — LORATADINE 10 MG PO TABS
10.0000 mg | ORAL_TABLET | Freq: Every day | ORAL | Status: DC
  Filled 2022-08-21: qty 1

## 2022-08-21 NOTE — Patient Instructions (Signed)

## 2022-08-22 ENCOUNTER — Encounter: Payer: Self-pay | Admitting: Oncology

## 2022-08-22 DIAGNOSIS — Z85118 Personal history of other malignant neoplasm of bronchus and lung: Secondary | ICD-10-CM | POA: Insufficient documentation

## 2022-08-25 ENCOUNTER — Telehealth: Payer: Self-pay | Admitting: Oncology

## 2022-08-25 NOTE — Telephone Encounter (Signed)
R/s pt's new hem appt time per nurse request. Called pt, no answer. Left msg with new appt time.

## 2022-08-28 ENCOUNTER — Inpatient Hospital Stay: Payer: Medicare Other

## 2022-08-28 VITALS — BP 117/60 | HR 72 | Temp 98.0°F | Resp 18

## 2022-08-28 DIAGNOSIS — D500 Iron deficiency anemia secondary to blood loss (chronic): Secondary | ICD-10-CM

## 2022-08-28 DIAGNOSIS — I2510 Atherosclerotic heart disease of native coronary artery without angina pectoris: Secondary | ICD-10-CM

## 2022-08-28 MED ORDER — SODIUM CHLORIDE 0.9 % IV SOLN: Freq: Once | INTRAVENOUS | Status: AC

## 2022-08-28 MED ORDER — SODIUM CHLORIDE 0.9 % IV SOLN
510.0000 mg | Freq: Once | INTRAVENOUS | Status: AC
  Administered 2022-08-28: 510 mg via INTRAVENOUS
  Filled 2022-08-28: qty 510

## 2022-08-28 MED ORDER — LORATADINE 10 MG PO TABS
10.0000 mg | ORAL_TABLET | Freq: Every day | ORAL | Status: DC
  Administered 2022-08-28: 10 mg via ORAL
  Filled 2022-08-28: qty 1

## 2022-08-28 MED ORDER — ACETAMINOPHEN 325 MG PO TABS
650.0000 mg | ORAL_TABLET | Freq: Once | ORAL | Status: AC
  Administered 2022-08-28: 650 mg via ORAL
  Filled 2022-08-28: qty 2

## 2022-08-28 NOTE — Patient Instructions (Signed)

## 2022-09-01 ENCOUNTER — Inpatient Hospital Stay: Payer: Medicare Other

## 2022-09-01 ENCOUNTER — Inpatient Hospital Stay: Payer: Medicare Other | Attending: Oncology | Admitting: Oncology

## 2022-09-01 VITALS — BP 123/67 | HR 87 | Temp 97.9°F | Resp 17 | Wt 102.6 lb

## 2022-09-01 DIAGNOSIS — R5382 Chronic fatigue, unspecified: Secondary | ICD-10-CM | POA: Diagnosis not present

## 2022-09-01 DIAGNOSIS — F17210 Nicotine dependence, cigarettes, uncomplicated: Secondary | ICD-10-CM | POA: Insufficient documentation

## 2022-09-01 DIAGNOSIS — I700 Atherosclerosis of aorta: Secondary | ICD-10-CM | POA: Insufficient documentation

## 2022-09-01 DIAGNOSIS — J449 Chronic obstructive pulmonary disease, unspecified: Secondary | ICD-10-CM | POA: Insufficient documentation

## 2022-09-01 DIAGNOSIS — I2510 Atherosclerotic heart disease of native coronary artery without angina pectoris: Secondary | ICD-10-CM | POA: Insufficient documentation

## 2022-09-01 DIAGNOSIS — E785 Hyperlipidemia, unspecified: Secondary | ICD-10-CM | POA: Insufficient documentation

## 2022-09-01 DIAGNOSIS — D500 Iron deficiency anemia secondary to blood loss (chronic): Secondary | ICD-10-CM | POA: Insufficient documentation

## 2022-09-01 DIAGNOSIS — I10 Essential (primary) hypertension: Secondary | ICD-10-CM | POA: Diagnosis not present

## 2022-09-01 DIAGNOSIS — D141 Benign neoplasm of larynx: Secondary | ICD-10-CM

## 2022-09-01 DIAGNOSIS — Z800 Family history of malignant neoplasm of digestive organs: Secondary | ICD-10-CM | POA: Insufficient documentation

## 2022-09-01 DIAGNOSIS — K219 Gastro-esophageal reflux disease without esophagitis: Secondary | ICD-10-CM | POA: Diagnosis not present

## 2022-09-01 DIAGNOSIS — Z801 Family history of malignant neoplasm of trachea, bronchus and lung: Secondary | ICD-10-CM | POA: Insufficient documentation

## 2022-09-01 DIAGNOSIS — R0602 Shortness of breath: Secondary | ICD-10-CM | POA: Diagnosis not present

## 2022-09-01 DIAGNOSIS — Z85118 Personal history of other malignant neoplasm of bronchus and lung: Secondary | ICD-10-CM | POA: Diagnosis not present

## 2022-09-01 DIAGNOSIS — I252 Old myocardial infarction: Secondary | ICD-10-CM | POA: Diagnosis not present

## 2022-09-01 DIAGNOSIS — Z79899 Other long term (current) drug therapy: Secondary | ICD-10-CM | POA: Insufficient documentation

## 2022-09-01 DIAGNOSIS — J4489 Other specified chronic obstructive pulmonary disease: Secondary | ICD-10-CM | POA: Insufficient documentation

## 2022-09-01 DIAGNOSIS — Z7951 Long term (current) use of inhaled steroids: Secondary | ICD-10-CM | POA: Diagnosis not present

## 2022-09-01 DIAGNOSIS — Z87891 Personal history of nicotine dependence: Secondary | ICD-10-CM | POA: Insufficient documentation

## 2022-09-01 DIAGNOSIS — I259 Chronic ischemic heart disease, unspecified: Secondary | ICD-10-CM

## 2022-09-01 LAB — CBC WITH DIFFERENTIAL (CANCER CENTER ONLY)
Abs Immature Granulocytes: 0.02 10*3/uL (ref 0.00–0.07)
Basophils Absolute: 0 10*3/uL (ref 0.0–0.1)
Basophils Relative: 0 %
Eosinophils Absolute: 0.3 10*3/uL (ref 0.0–0.5)
Eosinophils Relative: 5 %
HCT: 36.3 % — ABNORMAL LOW (ref 39.0–52.0)
Hemoglobin: 11.8 g/dL — ABNORMAL LOW (ref 13.0–17.0)
Immature Granulocytes: 0 %
Lymphocytes Relative: 7 %
Lymphs Abs: 0.5 10*3/uL — ABNORMAL LOW (ref 0.7–4.0)
MCH: 31.1 pg (ref 26.0–34.0)
MCHC: 32.5 g/dL (ref 30.0–36.0)
MCV: 95.5 fL (ref 80.0–100.0)
Monocytes Absolute: 0.6 10*3/uL (ref 0.1–1.0)
Monocytes Relative: 8 %
Neutro Abs: 5.9 10*3/uL (ref 1.7–7.7)
Neutrophils Relative %: 80 %
Platelet Count: 254 10*3/uL (ref 150–400)
RBC: 3.8 MIL/uL — ABNORMAL LOW (ref 4.22–5.81)
RDW: 15.2 % (ref 11.5–15.5)
WBC Count: 7.4 10*3/uL (ref 4.0–10.5)
nRBC: 0 % (ref 0.0–0.2)

## 2022-09-01 LAB — FERRITIN: Ferritin: 446 ng/mL — ABNORMAL HIGH (ref 24–336)

## 2022-09-01 NOTE — Progress Notes (Unsigned)
[facility] Initial Visit:  Patient Care Team: Leonard Downing, MD as PCP - General (Family Medicine) Nahser, Wonda Cheng, MD as PCP - Cardiology (Cardiology) Delma Freeze, Raylene Miyamoto, MD as Referring Physician (Pulmonary Disease) Jeanine Luz, MD as Referring Physician (Otolaryngology) [facility], [place] as Referring Physician ([place]) [place] as Referring Physician ([place]) Barbee Cough, MD as Attending Physician (Hematology)  CHIEF COMPLAINTS/PURPOSE OF CONSULTATION:  Oncology History   No history exists.    HISTORY OF PRESENTING ILLNESS: William Lee 76 y.o. male is here because of anemia Medical history notable for coronary artery disease, history of MI, COPD, papilloma of larynx, GERD, carpal tunnel syndrome History of lung cancer.  EGD 2014   October 30 2019:  CT virtual colonoscopy No fixed non barium tagged polypoid filling defects or annular constricting lesions.   Diffusely calcified visualized right coronary artery. Aortic atherosclerosis.   Small low-density lesions in the liver and right kidney. There is also a hyperdense lesion within the right kidney. These likely reflects cysts although these cannot be fully characterized on this noncontrast study. These could be further evaluated with ultrasound or contrast-enhanced CT as clinically indicated.   03/15/2021 PET scan IMPRESSION: 1. Redemonstrated mixed cystic and solid lesion in the left upper lobe. The solid component has FDG avidity slightly above liver and is concerning for a primary malignancy. 2. No evidence of FDG avid metastatic disease. 3. Focus of FDG avidity in the ascending colon. Correlate with colonoscopy for further evaluation of possible neoplasm.   June 29, 2022: WBC 5.7 hemoglobin 11.1 MCV 90 platelet count 288; 72 segs 10 lymphs 9 monos 8 eos 1 basophil.  Reticulocyte count 1.7.  Iron saturation  10%  August 11 2022:  [facility] Consult  Lung cancer was treated at [facility] in August 2022 with SBRT.  He believes that he has been anemic for quite some time Patient is on supplemental O2 at 2 lit [place] which he uses when he is walking or sleeping.   Taking oral iron three times daily.  On ASA 81 mg daily.  Has never had PRBC's or IV iron.  Takes ibuprofen fairly often  Social:  Married.  Worked on a [employer].  Did spray painting.  Smoked up to 3 ppd of cigarettes until 1992 when noted increased SOB.  EtOH rare in the past  [facility] Mother died 39 liver cancer, occular melanoma Father died 73 cancer Brother died 57 cancer Sister died 42 lung cancer  WBC 6.9 hemoglobin 10.8 MCV 92 platelet count 295; 76 segs 9 lymphs 6 monos 6 eos 1 basophil Reticulocytes 2.0% Direct [place] test negative haptoglobin 232 SPEP with IEP showed no paraprotein serum free kappa 52 lambda 25.3 with a kappa lambda 2.06 IgG 900 IgA 165 IgM 42 CMP notable for creatinine 1.24 glucose of 107 estimated GFR 60 B12 367 folate 14.1 ferritin 11 Copper 123 zinc 73  August 21 2022:  Feraheme 510 mg IV August 28 2022:  Feraheme 510 mg IV  September 01 2022:  Scheduled follow up for anemia.  Remains fatigued despite receiving IV iron but it may be a bit early to assess response Still taking oral iron but not as frequently now once or twice daily instead of 3 times daily Will check CBC with diff, Ferritin today  States that there is concern about safety about intubating him because of his upper airway and pulmonary status. States he has a  history of ulcer noted on EGD done about 20 yrs ago Follow up in 2 months  Review of Systems  Constitutional:  Positive for appetite change, fatigue and unexpected weight change. Negative for chills and fever.       Has been steadily loosing weight  HENT:   Positive for trouble swallowing. Negative for mouth sores, nosebleeds, sore throat and voice  change.        Sometimes has trouble swallowing solids  Eyes:  Negative for eye problems and icterus.       Vision changes:  None  Respiratory:  Positive for cough, shortness of breath and wheezing. Negative for chest tightness and hemoptysis.        Usually has dry cough  Cardiovascular:  Negative for chest pain, leg swelling and palpitations.       PND:  none Orthopnea:  none  Gastrointestinal:  Positive for constipation. Negative for abdominal pain, blood in stool, diarrhea, nausea and vomiting.       Stools dark due to oral iron Has cramping due to constipation  Endocrine: Negative for hot flashes.       Cold intolerance    Genitourinary:  Negative for bladder incontinence, difficulty urinating, dysuria, frequency and hematuria.        Occasional nocturia up to 3 times  Musculoskeletal:  Positive for arthralgias, back pain and myalgias. Negative for gait problem, neck pain and neck stiffness.  Skin:  Positive for itching. Negative for rash and wound.       Itching on back and legs  Neurological:  Positive for light-headedness and numbness. Negative for dizziness, extremity weakness, gait problem, headaches and speech difficulty.       Neuropathy of hands  Hematological:  Negative for adenopathy. Bruises/bleeds easily.  Psychiatric/Behavioral:  Negative for sleep disturbance and suicidal ideas. The patient is not nervous/anxious.     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   bilateral hearing aids used   Hyperlipidemia    Hypertension    Ischemic heart disease     left Inguinal hernia 02/01/2012   Repaired 05/01/12    MI (myocardial infarction) ([place])    Mixed hyperlipidemia 05/16/2017   Papilloma of larynx    PONV (postoperative nausea and vomiting)    Shortness of breath 11/11/2011   Oxygen @ 2L via [place] prn   SOB (shortness of breath) on exertion     SURGICAL HISTORY: Past Surgical History:  Procedure Laterality Date   CARDIAC CATHETERIZATION     CARPAL TUNNEL RELEASE Right 06/10/2021   Procedure: Right Carpal Tunnel Release;  Surgeon: Erline Levine, MD;  Location: [facility];  Service: Neurosurgery;  Laterality: Right;  RM 21   CARPAL TUNNEL RELEASE Left 08/01/2021   Procedure: Left carpal tunnel release;  Surgeon: Erline Levine, MD;  Location: [facility];  Service: Neurosurgery;  Laterality: Left;   CHOLECYSTECTOMY  12/1995   COLONOSCOPY WITH PROPOFOL N/A 09/23/2013   Procedure: COLONOSCOPY WITH PROPOFOL;  Surgeon: Winfield Cunas., MD;  Location: [facility];  Service: Endoscopy;  Laterality: N/A;   CORONARY ARTERY BYPASS GRAFT     ESOPHAGOGASTRODUODENOSCOPY (EGD) WITH PROPOFOL N/A 09/23/2013   Procedure: ESOPHAGOGASTRODUODENOSCOPY (EGD) WITH PROPOFOL;  Surgeon: Winfield Cunas., MD;  [facility];  Service: Endoscopy;  Laterality: N/A;   HERNIA REPAIR     INGUINAL HERNIA REPAIR  05/01/2012   Procedure: HERNIA REPAIR INGUINAL ADULT;  Surgeon: Haywood Lasso, MD;  Location: [facility];  Service: General;  Laterality: Left;  inguinal   papalomas virus removal  3/10 7/10 5/11 10/12   PERIPHERAL VASCULAR CATHETERIZATION N/A 04/19/2016   Procedure: Abdominal Aortogram w/Lower Extremity;  Surgeon: Rosetta Posner, MD;  Location: [facility];  Service: Cardiovascular;  Laterality: N/A;    SOCIAL HISTORY: Social History   Socioeconomic History   Marital status: Married    Spouse name: Not on file   Number of children: Not on file   Years of education: Not on file   Highest education level: Not on file  Occupational  History   Not on file  Tobacco Use   Smoking status: Former    Types: Cigarettes    Quit date: 10/30/1990    Years since quitting: 31.8   Smokeless tobacco: Never  Vaping Use   Vaping Use: Never used  Substance and Sexual Activity   Alcohol use: No   Drug use: Yes    Types: Marijuana    Comment: Last use 06/25/21   Sexual activity: Not on file  Other Topics Concern   Not on file  Social History Narrative   Not on file   Social Determinants of Health   Financial Resource Strain: Not on file  Food Insecurity: Not on file  Transportation Needs: Not on file  Physical Activity: Not on file  Stress: Not on file  Social Connections: Not on file  Intimate Partner Violence: Not on file    FAMILY HISTORY Family History  Problem Relation Age of Onset   Liver cancer Mother    Bone cancer Father    Lung cancer Sister    Cancer Brother        unknown    ALLERGIES:  is allergic to silver and tape.  MEDICATIONS:  Current Outpatient Medications  Medication Sig Dispense Refill   albuterol (PROVENTIL HFA;VENTOLIN HFA) 108 (90 BASE) MCG/ACT inhaler Inhale 2 puffs into the lungs every 6 (six) hours as needed for wheezing.     aspirin 81 MG tablet Take 81 mg by mouth daily.     atorvastatin (LIPITOR) 40 MG tablet Take 20 mg by mouth daily.      Camphor-Menthol-Methyl Sal (SALONPAS EX) Apply 1 patch topically 2 (two) times daily as needed (pain).     Cholecalciferol 50 MCG (2000 UT) TABS Take 2,000 Units by mouth daily.     diltiazem (CARDIZEM CD) 120 MG 24 hr capsule Take 120 mg by mouth at bedtime.     Doxylamine-DM (VICKS NYQUIL COUGH) 6.25-15 MG/15ML LIQD Take 15 mLs by mouth 2 (two) times daily as needed (congestion/sleep).     Fluticasone-Salmeterol (ADVAIR) 250-50 MCG/DOSE AEPB Inhale 1 puff into the lungs 2 (two) times daily. Per the [place] [facility] PCP - [place] Patient will complete Symbicort until bottle empty then will switch to wixela on 09/23/20     Ibuprofen 200 MG CAPS Take 600-800 mg  by mouth every 6 (six) hours as needed (pain/headache/fever).     omeprazole (PRILOSEC) 20 MG capsule Take 40 mg by mouth 2 (two) times daily.  OXYGEN Inhale 2 L into the lungs as needed (During activity).     polyvinyl alcohol (LIQUIFILM TEARS) 1.4 % ophthalmic solution Place 1 drop into both eyes as needed for dry eyes.     potassium chloride 20 MEQ/15ML (10%) SOLN Take 20 mEq by mouth 2 (two) times daily.      predniSONE (DELTASONE) 20 MG tablet Take 20 mg by mouth daily as needed.     SPIRIVA HANDIHALER 18 MCG inhalation capsule Place 18 mcg into inhaler and inhale daily.     traMADol (ULTRAM) 50 MG tablet Take 1 tablet (50 mg total) by mouth every 6 (six) hours as needed for moderate pain. 30 tablet 0   valsartan (DIOVAN) 40 MG tablet TAKE 1 TABLET BY MOUTH EVERY DAY 90 tablet 2   No current facility-administered medications for this visit.    PHYSICAL EXAMINATION:  ECOG PERFORMANCE STATUS: 1 - Symptomatic but completely ambulatory   There were no vitals filed for this visit.   There were no vitals filed for this visit.    Physical Exam Vitals and nursing note reviewed.  Constitutional:      Appearance: Normal appearance. He is ill-appearing. He is not toxic-appearing or diaphoretic.     Comments: Here with wife.  Thin.  Using O2 via [place]  HENT:     Head: Normocephalic and atraumatic.     Right Ear: External ear normal.     Left Ear: External ear normal.     Nose: Nose normal.  Eyes:     Conjunctiva/sclera: Conjunctivae normal.     Pupils: Pupils are equal, round, and reactive to light.  Cardiovascular:     Rate and Rhythm: Normal rate and regular rhythm.     Heart sounds: Normal heart sounds. No murmur heard.    No friction rub. No gallop.     Comments: Well healed sternotomy scar Chest rachitic Pulmonary:     Effort: Pulmonary effort is normal. No respiratory distress.     Breath sounds: No stridor. No rhonchi.     Comments: BS distant Abdominal:     General:  Abdomen is flat. There is no distension.     Palpations: Abdomen is soft. There is no mass.     Tenderness: There is no abdominal tenderness. There is no guarding or rebound.  Musculoskeletal:     Cervical back: Normal range of motion and neck supple. No rigidity or tenderness.     Comments: Decreased muscle mass throughout  Lymphadenopathy:     Head:     Right side of head: No submental, submandibular, tonsillar, preauricular, posterior auricular or occipital adenopathy.     Left side of head: No submental, submandibular, tonsillar, preauricular, posterior auricular or occipital adenopathy.     Cervical: No cervical adenopathy.     Right cervical: No superficial, deep or posterior cervical adenopathy.    Left cervical: No superficial, deep or posterior cervical adenopathy.     Upper Body:     Right upper body: No supraclavicular or axillary adenopathy.     Left upper body: No supraclavicular or axillary adenopathy.     Lower Body: No right inguinal adenopathy. No left inguinal adenopathy.  Skin:    Coloration: Skin is pale. Skin is not jaundiced.     Findings: No bruising, erythema, lesion or rash.  Neurological:     General: No focal deficit present.     Mental Status: He is alert and oriented to person, place, and time. Mental status is at  baseline.     Cranial Nerves: No cranial nerve deficit.  Psychiatric:        Mood and Affect: Mood normal.        Behavior: Behavior normal.        Thought Content: Thought content normal.        Judgment: Judgment normal.    LABORATORY DATA: I have personally reviewed the data as listed:  Appointment on 08/11/2022  Component Date Value Ref Range Status   Zinc 08/11/2022 73  44 - 115 ug/dL Final   Comment: (NOTE) This test was developed and its performance characteristics determined by [facility]. It has not been cleared or approved by the Food and Drug Administration.                                Detection Limit = 5 Performed At: [facility] [place], [place] [id number] Rush Farmer MD [id number]    Vitamin B-12 08/11/2022 367  180 - 914 pg/mL Final   Comment: (NOTE) This assay is not validated for testing neonatal or myeloproliferative syndrome specimens for Vitamin B12 levels. Performed at [facility], [place] [address], [place], [place] [address]    Retic Ct Pct 08/11/2022 2.0  0.4 - 3.1 % Final   RBC. 08/11/2022 3.63 (L)  4.22 - 5.81 MIL/uL Final   Retic Count, Absolute 08/11/2022 70.8  19.0 - 186.0 K/uL Final   Immature Retic Fract 08/11/2022 11.9  2.3 - 15.9 % Final   Performed at [facility], [place] [address], [place], [place] [address]   IgG (Immunoglobin G), Serum 08/11/2022 900  603 - 1,613 mg/dL Final   IgA 08/11/2022 165  61 - 437 mg/dL Final   IgM (Immunoglobulin M), Srm 08/11/2022 42  15 - 143 mg/dL Final   Total Protein ELP 08/11/2022 6.4  6.0 - 8.5 g/dL Corrected   Albumin SerPl Elph-Mcnc 08/11/2022 3.6  2.9 - 4.4 g/dL Corrected   Alpha 1 08/11/2022 0.3  0.0 - 0.4 g/dL Corrected   Alpha2 Glob SerPl Elph-Mcnc 08/11/2022 0.8  0.4 - 1.0 g/dL Corrected   B-Globulin SerPl Elph-Mcnc 08/11/2022 0.9  0.7 - 1.3 g/dL Corrected   Gamma Glob SerPl Elph-Mcnc 08/11/2022 0.9  0.4 - 1.8 g/dL Corrected   M Protein SerPl Elph-Mcnc 08/11/2022 Not Observed  Not Observed g/dL Corrected   Globulin, Total 08/11/2022 2.8  2.2 - 3.9 g/dL Corrected   Albumin/Glob SerPl 08/11/2022 1.3  0.7 - 1.7 Corrected   IFE 1 08/11/2022 Comment   Corrected   Comment: (NOTE) The immunofixation pattern appears unremarkable. Evidence of monoclonal protein is not apparent.    Please Note 08/11/2022 Comment   Corrected   Comment: (NOTE) Protein electrophoresis scan will follow via computer, mail, or courier delivery. Performed At: [facility] [place], [place] [id number] Rush Farmer MD [id number]    Kappa free light chain 08/11/2022  52.0 (H)  3.3 - 19.4 mg/L Final   Lambda free light chains 08/11/2022 25.3  5.7 - 26.3 mg/L Final   Kappa, lambda light chain ratio 08/11/2022 2.06 (H)  0.26 - 1.65 Final   Comment: (NOTE) Performed At: [facility] [address] [place], [place] [id number] Rush Farmer MD [id number]    Haptoglobin 08/11/2022 232  34 - 355 mg/dL Final   Comment: (NOTE) Performed At: [facility] [place], [place] [id number] Rush Farmer MD  [id number]    Folate 08/11/2022 14.1  >5.9 ng/mL Final   Performed at [place] [address], [place], [place] [address]   Ferritin 08/11/2022 11 (L)  24 - 336 ng/mL Final   Performed at [facility], [address] [place], [place] [address]   DAT, complement 08/11/2022 NEG   Final   DAT, IgG 08/11/2022    Final                   Value:NEG Performed at [facility], [place] [address] [place], [place] [address]    Copper 08/11/2022 123  69 - 132 ug/dL Final   Comment: (NOTE) This test was developed and its performance characteristics determined by [facility]. It has not been cleared or approved by the Food and Drug Administration.                                Detection Limit = 5 Performed At: [facility] [place], [place] [id number] Rush Farmer MD [id number]    Sodium 08/11/2022 137  135 - 145 mmol/L Final   Potassium 08/11/2022 4.3  3.5 - 5.1 mmol/L Final   Chloride 08/11/2022 106  98 - 111 mmol/L Final   CO2 08/11/2022 27  22 - 32 mmol/L Final   Glucose, Bld 08/11/2022 107 (H)  70 - 99 mg/dL Final   Glucose reference range applies only to samples taken after fasting for at least 8 hours.   BUN 08/11/2022 18  8 - 23 mg/dL Final   Creatinine 08/11/2022 1.24  0.61 - 1.24 mg/dL Final   Calcium 08/11/2022 8.9  8.9 - 10.3 mg/dL Final   Total Protein 08/11/2022 7.0  6.5 - 8.1 g/dL Final   Albumin 08/11/2022 4.1  3.5 - 5.0 g/dL  Final   AST 08/11/2022 17  15 - 41 U/L Final   ALT 08/11/2022 14  0 - 44 U/L Final   Alkaline Phosphatase 08/11/2022 84  38 - 126 U/L Final   Total Bilirubin 08/11/2022 0.9  0.3 - 1.2 mg/dL Final   GFR, Estimated 08/11/2022 60 (L)  >60 mL/min Final   Comment: (NOTE) Calculated using the CKD-EPI Creatinine Equation (2021)    Anion gap 08/11/2022 4 (L)  5 - 15 Final   Performed at [facility], [place] [address] [place], [place] [address]   WBC Count 08/11/2022 6.9  4.0 - 10.5 K/uL Final   RBC 08/11/2022 3.65 (L)  4.22 - 5.81 MIL/uL Final   Hemoglobin 08/11/2022 10.8 (L)  13.0 - 17.0 g/dL Final   HCT 08/11/2022 33.6 (L)  39.0 - 52.0 % Final   MCV 08/11/2022 92.1  80.0 - 100.0 fL Final   MCH 08/11/2022 29.6  26.0 - 34.0 pg Final   MCHC 08/11/2022 32.1  30.0 - 36.0 g/dL Final   RDW 08/11/2022 15.4  11.5 - 15.5 % Final   Platelet Count 08/11/2022 295  150 - 400 K/uL Final   nRBC 08/11/2022 0.0  0.0 - 0.2 % Final   Neutrophils Relative % 08/11/2022 76  % Final   Neutro Abs 08/11/2022 5.3  1.7 - 7.7 K/uL Final   Lymphocytes Relative 08/11/2022 9  % Final   Lymphs Abs 08/11/2022 0.6 (L)  0.7 - 4.0 K/uL Final   Monocytes Relative 08/11/2022 6  % Final   Monocytes Absolute 08/11/2022 0.4  0.1 - 1.0 K/uL  Final   Eosinophils Relative 08/11/2022 6  % Final   Eosinophils Absolute 08/11/2022 0.4  0.0 - 0.5 K/uL Final   Basophils Relative 08/11/2022 1  % Final   Basophils Absolute 08/11/2022 0.1  0.0 - 0.1 K/uL Final   Immature Granulocytes 08/11/2022 2  % Final   Abs Immature Granulocytes 08/11/2022 0.11 (H)  0.00 - 0.07 K/uL Final   Performed at [facility], [place] [address], [place], [place] [address]    RADIOGRAPHIC STUDIES: I have personally reviewed the radiological images as listed and agree with the findings in the report  No results found.  ASSESSMENT/PLAN   76 y.o. male with medical history notable for coronary artery disease,  history of MI, COPD, papilloma of larynx, GERD, carpal tunnel syndrome, history of lung cancer and tobacco use who is seen for management of anemia  Anemia:  Likely multifactorial.  Obtain CBC with diff, CMP, retic count, Ferritin B12, folate, DAT, Haptoglobin, SPEP with IEP, free light chains and smear for morphology review.    Therapeutics:   Patient has symptomatic anemia with Hgb < 10 and cardiopulmonary disease.  He has has not responded to oral iron therefore we will will arrange for IV iron replacement.   Given the severe symptoms we prefer to replete iron stores in one or two visits rather than over the course of several months.  In addition ongoing blood loss exceeds the capacity of oral iron to meet needs. A discussion regarding risks was had with the patient.  IV iron has the potential to cause allergic reactions, including potentially life-threatening anaphylaxis.   IV iron may be associated with non-allergic infusion reactions including self-limiting urticaria, palpitations, dizziness, and neck and back spasm; generally, these occur in <1 percent of individuals and do not progress to more serious reactions. The non-allergic reaction consisting of flushing of the face and myalgias of the chest and back.   After discussion of the risks and benefits of IV iron therapy patient has elected to proceed with parenteral iron therapy.    Lung cancer:  treated at [facility] in August 2022 with SBRT  History of tobacco use:  Smoked up to 3 ppd of cigarettes until 1992 when noted increased SOB   Cancer Staging  No matching staging information was found for the patient.   No problem-specific Assessment & Plan notes found for this encounter.   No orders of the defined types were placed in this encounter.   All questions were answered. The patient knows to call the clinic with any problems, questions or concerns.  This note was electronically signed.    Barbee Cough, MD  09/01/2022 12:50  PM

## 2022-09-05 ENCOUNTER — Encounter: Payer: Self-pay | Admitting: Oncology

## 2022-11-03 ENCOUNTER — Inpatient Hospital Stay: Payer: Medicare Other | Attending: Oncology

## 2022-11-03 ENCOUNTER — Inpatient Hospital Stay (HOSPITAL_BASED_OUTPATIENT_CLINIC_OR_DEPARTMENT_OTHER): Payer: Medicare Other | Admitting: Oncology

## 2022-11-03 VITALS — BP 141/73 | HR 90 | Temp 98.2°F | Resp 16 | Wt 103.5 lb

## 2022-11-03 DIAGNOSIS — E785 Hyperlipidemia, unspecified: Secondary | ICD-10-CM | POA: Insufficient documentation

## 2022-11-03 DIAGNOSIS — Z85118 Personal history of other malignant neoplasm of bronchus and lung: Secondary | ICD-10-CM

## 2022-11-03 DIAGNOSIS — Z801 Family history of malignant neoplasm of trachea, bronchus and lung: Secondary | ICD-10-CM | POA: Insufficient documentation

## 2022-11-03 DIAGNOSIS — Z800 Family history of malignant neoplasm of digestive organs: Secondary | ICD-10-CM | POA: Diagnosis not present

## 2022-11-03 DIAGNOSIS — Z79899 Other long term (current) drug therapy: Secondary | ICD-10-CM | POA: Insufficient documentation

## 2022-11-03 DIAGNOSIS — K5900 Constipation, unspecified: Secondary | ICD-10-CM | POA: Diagnosis not present

## 2022-11-03 DIAGNOSIS — I10 Essential (primary) hypertension: Secondary | ICD-10-CM | POA: Diagnosis not present

## 2022-11-03 DIAGNOSIS — I252 Old myocardial infarction: Secondary | ICD-10-CM | POA: Diagnosis not present

## 2022-11-03 DIAGNOSIS — I700 Atherosclerosis of aorta: Secondary | ICD-10-CM | POA: Diagnosis not present

## 2022-11-03 DIAGNOSIS — R0602 Shortness of breath: Secondary | ICD-10-CM

## 2022-11-03 DIAGNOSIS — I2510 Atherosclerotic heart disease of native coronary artery without angina pectoris: Secondary | ICD-10-CM | POA: Insufficient documentation

## 2022-11-03 DIAGNOSIS — K219 Gastro-esophageal reflux disease without esophagitis: Secondary | ICD-10-CM | POA: Insufficient documentation

## 2022-11-03 DIAGNOSIS — D500 Iron deficiency anemia secondary to blood loss (chronic): Secondary | ICD-10-CM

## 2022-11-03 DIAGNOSIS — R5382 Chronic fatigue, unspecified: Secondary | ICD-10-CM | POA: Insufficient documentation

## 2022-11-03 DIAGNOSIS — J449 Chronic obstructive pulmonary disease, unspecified: Secondary | ICD-10-CM | POA: Insufficient documentation

## 2022-11-03 DIAGNOSIS — Z808 Family history of malignant neoplasm of other organs or systems: Secondary | ICD-10-CM | POA: Insufficient documentation

## 2022-11-03 DIAGNOSIS — Z87891 Personal history of nicotine dependence: Secondary | ICD-10-CM | POA: Insufficient documentation

## 2022-11-03 LAB — CBC WITH DIFFERENTIAL (CANCER CENTER ONLY)
Abs Immature Granulocytes: 0.02 10*3/uL (ref 0.00–0.07)
Basophils Absolute: 0.1 10*3/uL (ref 0.0–0.1)
Basophils Relative: 1 %
Eosinophils Absolute: 0.4 10*3/uL (ref 0.0–0.5)
Eosinophils Relative: 6 %
HCT: 36.1 % — ABNORMAL LOW (ref 39.0–52.0)
Hemoglobin: 11.9 g/dL — ABNORMAL LOW (ref 13.0–17.0)
Immature Granulocytes: 0 %
Lymphocytes Relative: 9 %
Lymphs Abs: 0.6 10*3/uL — ABNORMAL LOW (ref 0.7–4.0)
MCH: 30.7 pg (ref 26.0–34.0)
MCHC: 33 g/dL (ref 30.0–36.0)
MCV: 93.3 fL (ref 80.0–100.0)
Monocytes Absolute: 0.5 10*3/uL (ref 0.1–1.0)
Monocytes Relative: 7 %
Neutro Abs: 5.2 10*3/uL (ref 1.7–7.7)
Neutrophils Relative %: 77 %
Platelet Count: 290 10*3/uL (ref 150–400)
RBC: 3.87 MIL/uL — ABNORMAL LOW (ref 4.22–5.81)
RDW: 13.5 % (ref 11.5–15.5)
WBC Count: 6.8 10*3/uL (ref 4.0–10.5)
nRBC: 0 % (ref 0.0–0.2)

## 2022-11-03 LAB — FERRITIN: Ferritin: 45 ng/mL (ref 24–336)

## 2022-11-03 NOTE — Patient Instructions (Signed)
You can take Solgar gentle iron 25 mg daily or [place]stone's multivitamin with iron daily This may help decrease the need for IV iron infusions

## 2022-11-03 NOTE — Progress Notes (Unsigned)
[facility] Follow up Visit:  Patient Care Team: Leonard Downing, MD as PCP - General (Family Medicine) Nahser, Wonda Cheng, MD as PCP - Cardiology (Cardiology) Delma Freeze, Raylene Miyamoto, MD as Referring Physician (Pulmonary Disease) Jeanine Luz, MD as Referring Physician (Otolaryngology) [facility], [place] as Referring Physician ([place]) [place] as Referring Physician (General Practice) Barbee Cough, MD as Attending Physician (Hematology)  CHIEF COMPLAINTS/PURPOSE OF CONSULTATION:  HISTORY OF PRESENTING ILLNESS: William Lee 77 y.o. male is here because of anemia Medical history notable for coronary artery disease, history of MI, COPD, papilloma of larynx, GERD, carpal tunnel syndrome History of lung cancer.  EGD 2014   October 30 2019:  CT virtual colonoscopy No fixed non barium tagged polypoid filling defects or annular constricting lesions. Diffusely calcified visualized right coronary artery. Aortic atherosclerosis.  Small low-density lesions in the liver and right kidney. There is also a hyperdense lesion within the right kidney. These likely reflects cysts although these cannot be fully characterized on this noncontrast study. These could be further evaluated with ultrasound or contrast-enhanced CT as clinically indicated.  Mar 15 2021 PET scan  Redemonstrated mixed cystic and solid lesion in the left upper lobe.  Solid component has FDG avidity slightly above liver and is concerning for a primary malignancy.  No evidence of FDG avid metastatic disease.  Focus of FDG avidity in the ascending colon. Correlate with colonoscopy for further evaluation of possible neoplasm.  June 29, 2022: WBC 5.7 hemoglobin 11.1 MCV 90 platelet count 288; 72 segs 10 lymphs 9 monos 8 eos 1 basophil.  Reticulocyte count 1.7.  Iron saturation 10%  August 11 2022:  [facility]  Lung cancer was treated at [facility] in  August 2022 with SBRT.  He believes that he has been anemic for quite some time Patient is on supplemental O2 at 2 lit [place] which he uses when he is walking or sleeping.   Taking oral iron three times daily.  On ASA 81 mg daily.  Has never had PRBC's or IV iron.  Takes ibuprofen fairly often  Social:  Married.  Worked on a [employer].  Did spray painting.  Smoked up to 3 ppd of cigarettes until 1992 when noted increased SOB.  EtOH rare in the past  [facility] Mother died 53 liver cancer, occular melanoma Father died 89 cancer Brother died 80 cancer Sister died 43 lung cancer  WBC 6.9 hemoglobin 10.8 MCV 92 platelet count 295; 76 segs 9 lymphs 6 monos 6 eos 1 basophil Reticulocytes 2.0% Direct [place] test negative haptoglobin 232 SPEP with IEP showed no paraprotein serum free kappa 52 lambda 25.3 with a kappa lambda 2.06 IgG 900 IgA 165 IgM 42 CMP notable for creatinine 1.24 glucose of 107 estimated GFR 60 B12 367 folate 14.1 ferritin 11 Copper 123 zinc 73  August 21 2022:  Feraheme 510 mg IV August 28 2022:  Feraheme 510 mg IV  September 01 2022:  Scheduled follow up for anemia.  Remains fatigued despite receiving IV iron but it may be a bit early to assess response Still taking oral iron but not as frequently now once or twice daily instead of 3 times daily.  States that there is concern about safety about intubating him because of his upper airway and pulmonary status.  Claims history of ulcer noted on EGD done about 20 yrs ago  WBC 7.4 hemoglobin 11.8 platelet count 254.  Ferritin 446  November 03 2022:  Scheduled follow up for management of anemia.  Gets fatigued at times.  Finds that breathing during the cold weather is hard.  Not taking oral iron; stopped them after receiving IV iron.  Had considerable constipation with oral iron.   Labs notable for hemoglobin 11.9 MCV 93 ferritin 45  Review of Systems  Constitutional:  Positive for appetite change, fatigue and  unexpected weight change. Negative for chills and fever.       Has been steadily loosing weight  HENT:   Positive for trouble swallowing. Negative for mouth sores, nosebleeds, sore throat and voice change.        Sometimes has trouble swallowing solids  Eyes:  Negative for eye problems and icterus.       Vision changes:  None  Respiratory:  Positive for cough, shortness of breath and wheezing. Negative for chest tightness and hemoptysis.        Usually has dry cough  Cardiovascular:  Negative for chest pain, leg swelling and palpitations.       PND:  none Orthopnea:  none  Gastrointestinal:  Positive for constipation. Negative for abdominal pain, blood in stool, diarrhea, nausea and vomiting.       Stools dark due to oral iron Has cramping due to constipation  Endocrine: Negative for hot flashes.       Cold intolerance    Genitourinary:  Negative for bladder incontinence, difficulty urinating, dysuria, frequency and hematuria.        Occasional nocturia up to 3 times  Musculoskeletal:  Positive for arthralgias, back pain and myalgias. Negative for gait problem, neck pain and neck stiffness.  Skin:  Positive for itching. Negative for rash and wound.       Itching on back and legs  Neurological:  Positive for light-headedness and numbness. Negative for dizziness, extremity weakness, gait problem, headaches and speech difficulty.       Neuropathy of hands  Hematological:  Negative for adenopathy. Bruises/bleeds easily.  Psychiatric/Behavioral:  Negative for sleep disturbance and suicidal ideas. The patient is not nervous/anxious.     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   bilateral hearing aids used   Hyperlipidemia    Hypertension    Ischemic heart disease    left Inguinal hernia 02/01/2012   Repaired 05/01/12    MI (myocardial infarction) ([place])    Mixed hyperlipidemia 05/16/2017   Papilloma of larynx    PONV (postoperative nausea and vomiting)    Shortness of breath 11/11/2011   Oxygen @ 2L via [place] prn   SOB (shortness of breath) on exertion     SURGICAL HISTORY: Past Surgical History:  Procedure Laterality Date   CARDIAC CATHETERIZATION     CARPAL TUNNEL RELEASE Right 06/10/2021   Procedure: Right Carpal Tunnel Release;  Surgeon: Erline Levine, MD;  Location: [facility];  Service: Neurosurgery;  Laterality: Right;  RM 21   CARPAL TUNNEL RELEASE Left 08/01/2021   Procedure: Left carpal tunnel release;  Surgeon: Erline Levine, MD;  Location[facility]  OR;  Service: Neurosurgery;  Laterality: Left;   CHOLECYSTECTOMY  12/1995   COLONOSCOPY WITH PROPOFOL N/A 09/23/2013   Procedure: COLONOSCOPY WITH PROPOFOL;  Surgeon: Winfield Cunas., MD;  Location: [facility];  Service: Endoscopy;  Laterality: N/A;   CORONARY ARTERY BYPASS GRAFT     ESOPHAGOGASTRODUODENOSCOPY (EGD) WITH PROPOFOL N/A 09/23/2013   Procedure: ESOPHAGOGASTRODUODENOSCOPY (EGD) WITH PROPOFOL;  Surgeon: Winfield Cunas., MD;  Location: [facility];  Service: Endoscopy;  Laterality: N/A;   HERNIA REPAIR     INGUINAL HERNIA REPAIR  05/01/2012   Procedure: HERNIA REPAIR INGUINAL ADULT;  Surgeon: Haywood Lasso, MD;  Location: [facility];  Service: General;  Laterality: Left;  inguinal   papalomas virus removal  3/10 7/10 5/11 10/12   PERIPHERAL VASCULAR CATHETERIZATION N/A 04/19/2016   Procedure: Abdominal Aortogram w/Lower Extremity;  Surgeon: Rosetta Posner, MD;  Location: [facility];  Service: Cardiovascular;  Laterality: N/A;    SOCIAL HISTORY: Social History    Socioeconomic History   Marital status: Married    Spouse name: Not on file   Number of children: Not on file   Years of education: Not on file   Highest education level: Not on file  Occupational History   Not on file  Tobacco Use   Smoking status: Former    Types: Cigarettes    Quit date: 10/30/1990    Years since quitting: 32.0   Smokeless tobacco: Never  Vaping Use   Vaping Use: Never used  Substance and Sexual Activity   Alcohol use: No   Drug use: Yes    Types: Marijuana    Comment: Last use 06/25/21   Sexual activity: Not on file  Other Topics Concern   Not on file  Social History Narrative   Not on file   Social Determinants of Health   Financial Resource Strain: Not on file  Food Insecurity: Not on file  Transportation Needs: Not on file  Physical Activity: Not on file  Stress: Not on file  Social Connections: Not on file  Intimate Partner Violence: Not on file    FAMILY HISTORY Family History  Problem Relation Age of Onset   Liver cancer Mother    Bone cancer Father    Lung cancer Sister    Cancer Brother        unknown    ALLERGIES:  is allergic to silver and tape.  MEDICATIONS:  Current Outpatient Medications  Medication Sig Dispense Refill   albuterol (PROVENTIL HFA;VENTOLIN HFA) 108 (90 BASE) MCG/ACT inhaler Inhale 2 puffs into the lungs every 6 (six) hours as needed for wheezing.     aspirin 81 MG tablet Take 81 mg by mouth daily.     atorvastatin (LIPITOR) 40 MG tablet Take 20 mg by mouth daily.      Camphor-Menthol-Methyl Sal (SALONPAS EX) Apply 1 patch topically 2 (two) times daily as needed (pain).     Cholecalciferol 50 MCG (2000 UT) TABS Take 2,000 Units by mouth daily.     diltiazem (CARDIZEM CD) 120 MG 24 hr capsule Take 120 mg by mouth at bedtime.     Doxylamine-DM (VICKS NYQUIL COUGH) 6.25-15 MG/15ML LIQD Take 15 mLs by mouth 2 (two) times daily as needed (congestion/sleep).     Fluticasone-Salmeterol (ADVAIR) 250-50 MCG/DOSE AEPB  Inhale 1 puff into the lungs 2 (two) times daily. Per the [facility] PCP - [place] Patient will complete Symbicort until bottle empty then will switch to wixela on 09/23/20  Ibuprofen 200 MG CAPS Take 600-800 mg by mouth every 6 (six) hours as needed (pain/headache/fever).     omeprazole (PRILOSEC) 20 MG capsule Take 40 mg by mouth 2 (two) times daily.     OXYGEN Inhale 2 L into the lungs as needed (During activity).     polyvinyl alcohol (LIQUIFILM TEARS) 1.4 % ophthalmic solution Place 1 drop into both eyes as needed for dry eyes.     potassium chloride 20 MEQ/15ML (10%) SOLN Take 20 mEq by mouth 2 (two) times daily.      predniSONE (DELTASONE) 20 MG tablet Take 20 mg by mouth daily as needed.     SPIRIVA HANDIHALER 18 MCG inhalation capsule Place 18 mcg into inhaler and inhale daily.     traMADol (ULTRAM) 50 MG tablet Take 1 tablet (50 mg total) by mouth every 6 (six) hours as needed for moderate pain. 30 tablet 0   valsartan (DIOVAN) 40 MG tablet TAKE 1 TABLET BY MOUTH EVERY DAY 90 tablet 2   No current facility-administered medications for this visit.    PHYSICAL EXAMINATION:  ECOG PERFORMANCE STATUS: 1 - Symptomatic but completely ambulatory   There were no vitals filed for this visit.   There were no vitals filed for this visit.    Physical Exam Vitals and nursing note reviewed.  Constitutional:      Appearance: Normal appearance. He is ill-appearing. He is not toxic-appearing or diaphoretic.     Comments: Here with wife.  Thin.  Using O2 via [place]  HENT:     Head: Normocephalic and atraumatic.     Right Ear: External ear normal.     Left Ear: External ear normal.     Nose: Nose normal.  Eyes:     Conjunctiva/sclera: Conjunctivae normal.     Pupils: Pupils are equal, round, and reactive to light.  Cardiovascular:     Rate and Rhythm: Normal rate and regular rhythm.     Heart sounds: Normal heart sounds. No murmur heard.    No friction rub. No gallop.     Comments: Well healed  sternotomy scar Chest rachitic Pulmonary:     Effort: Pulmonary effort is normal. No respiratory distress.     Breath sounds: No stridor. No rhonchi.     Comments: BS distant Abdominal:     General: Abdomen is flat. There is no distension.     Palpations: Abdomen is soft. There is no mass.     Tenderness: There is no abdominal tenderness. There is no guarding or rebound.  Musculoskeletal:     Cervical back: Normal range of motion and neck supple. No rigidity or tenderness.     Comments: Decreased muscle mass throughout  Lymphadenopathy:     Head:     Right side of head: No submental, submandibular, tonsillar, preauricular, posterior auricular or occipital adenopathy.     Left side of head: No submental, submandibular, tonsillar, preauricular, posterior auricular or occipital adenopathy.     Cervical: No cervical adenopathy.     Right cervical: No superficial, deep or posterior cervical adenopathy.    Left cervical: No superficial, deep or posterior cervical adenopathy.     Upper Body:     Right upper body: No supraclavicular or axillary adenopathy.     Left upper body: No supraclavicular or axillary adenopathy.     Lower Body: No right inguinal adenopathy. No left inguinal adenopathy.  Skin:    Coloration: Skin is pale. Skin is not jaundiced.  Findings: No bruising, erythema, lesion or rash.  Neurological:     General: No focal deficit present.     Mental Status: He is alert and oriented to person, place, and time. Mental status is at baseline.     Cranial Nerves: No cranial nerve deficit.  Psychiatric:        Mood and Affect: Mood normal.        Behavior: Behavior normal.        Thought Content: Thought content normal.        Judgment: Judgment normal.     LABORATORY DATA: I have personally reviewed the data as listed:  No visits with results within 1 Month(s) from this visit.  Latest known visit with results is:  Appointment on 09/01/2022  Component Date Value Ref  Range Status   Ferritin 09/01/2022 446 (H)  24 - 336 ng/mL Final   Performed at [facility], [place] [address], [place], [place] [address]   WBC Count 09/01/2022 7.4  4.0 - 10.5 K/uL Final   RBC 09/01/2022 3.80 (L)  4.22 - 5.81 MIL/uL Final   Hemoglobin 09/01/2022 11.8 (L)  13.0 - 17.0 g/dL Final   HCT 09/01/2022 36.3 (L)  39.0 - 52.0 % Final   MCV 09/01/2022 95.5  80.0 - 100.0 fL Final   MCH 09/01/2022 31.1  26.0 - 34.0 pg Final   MCHC 09/01/2022 32.5  30.0 - 36.0 g/dL Final   RDW 09/01/2022 15.2  11.5 - 15.5 % Final   Platelet Count 09/01/2022 254  150 - 400 K/uL Final   nRBC 09/01/2022 0.0  0.0 - 0.2 % Final   Neutrophils Relative % 09/01/2022 80  % Final   Neutro Abs 09/01/2022 5.9  1.7 - 7.7 K/uL Final   Lymphocytes Relative 09/01/2022 7  % Final   Lymphs Abs 09/01/2022 0.5 (L)  0.7 - 4.0 K/uL Final   Monocytes Relative 09/01/2022 8  % Final   Monocytes Absolute 09/01/2022 0.6  0.1 - 1.0 K/uL Final   Eosinophils Relative 09/01/2022 5  % Final   Eosinophils Absolute 09/01/2022 0.3  0.0 - 0.5 K/uL Final   Basophils Relative 09/01/2022 0  % Final   Basophils Absolute 09/01/2022 0.0  0.0 - 0.1 K/uL Final   Immature Granulocytes 09/01/2022 0  % Final   Abs Immature Granulocytes 09/01/2022 0.02  0.00 - 0.07 K/uL Final   Performed at [facility], [place] [address] [place], [place] [address]    RADIOGRAPHIC STUDIES: I have personally reviewed the radiological images as listed and agree with the findings in the report  No results found.  ASSESSMENT/PLAN   77 y.o. male with medical history notable for coronary artery disease, history of MI, COPD, papilloma of larynx, GERD, carpal tunnel syndrome, history of lung cancer and tobacco use who is seen for management of anemia  Anemia:  Multifactorial.  Major component is iron deficiency from chronic GI blood loss.  Contributing factors are chronic illness and chronic kidney disease. He is not an  ideal candidate for direct endoscopy of the upper and lower GI tracts owing to severe pulmonary disease and by description upper airway abnormalities.  He has undergone virtual colonoscopy and PET scan which have been unremarkable.  A potential option is capsule endoscopy however this carries with it the risk of obstruction that could potentially require surgical intervention which would pose a grave risk to him.  Therapeutics:   Since patient had symptomatic anemia with contributions from  comorbidities of cardiopulmonary disease he received 2 doses of Feraheme 510 mg in October 2023 with improvement in hemoglobin.   November 03 2022:  Instructed patient to begin Solgar gentle iron or Flintstones multivitamin daily as tolerated to forestall the need for future doses of IV iron  Chronic fatigue: This persist despite improvement in his hemoglobin.  Again this is likely multifactorial with a major contribution being severe cardiopulmonary disease  Lung cancer:  [facility] with SBRT  History of tobacco use:  Smoked up to 3 ppd of cigarettes until 1992 when noted increased SOB   Cancer Staging  No matching staging information was found for the patient.   No problem-specific Assessment & Plan notes found for this encounter.   No orders of the defined types were placed in this encounter.  23  minutes was spent in patient care.  This included time spent preparing to see the patient (e.g., review of tests), obtaining and/or reviewing separately obtained history, counseling and educating the patient/family/caregiver, ordering medications, tests, documenting clinical information in the electronic or other health record, independently interpreting results and communicating results to the patient/family/caregiver as well as coordination of care.      All questions were answered. The patient knows to call the clinic with any problems, questions or concerns.  This note was electronically  signed.    Barbee Cough, MD  11/03/2022 8:33 AM

## 2022-11-04 ENCOUNTER — Telehealth: Payer: Self-pay | Admitting: Oncology

## 2022-11-04 NOTE — Telephone Encounter (Signed)
Spoke with patient confirming upcoming appointment 

## 2022-11-08 ENCOUNTER — Encounter: Payer: Self-pay | Admitting: Oncology

## 2022-11-20 ENCOUNTER — Encounter: Payer: Self-pay | Admitting: Cardiovascular Disease

## 2022-11-20 NOTE — Progress Notes (Signed)
Cardiology Office Note   Date:  11/21/2022   ID:  William Lee, DOB 1946/03/04, MRN [id number]  PCP:  Leonard Downing, MD  Cardiologist: Darlin Coco MD - now Ether Wolters   Problem list 1. Coronary artery disease-status post inferior wall myocardial infarction - CABG 1996.  2.  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  Hyperlipidemia 4.  PVD (Dr.  Donnetta Hutching)    Chief Complaint  Patient presents with   Coronary Artery Disease           05/16/17  William Lee is a 77 y.o. male who presents for a six-month follow-up visit Seen for the first time today .  Transfer from [place] [facility] No angina Has chronic dyspnea  Feb.  11, 2019: S/p CABG ,   Hx of COPD , is on home O2, 2 liters / min  No CP ,  Chronic shortness of breath   July 09, 2018: [place] is seen today for follow-up visit. He has a history of an inferior wall myocardial infarction in 1996.  He has a history of coronary artery bypass grafting.  He also has a history of severe COPD and is chronically short of breath. He is on Cardizem - EF is 40-45%   - EF increased slightly with the addition of the Losartan .  Ideally , we would not have him on Cardizem but he cannot take beta blockers. Due to his severe COPD .    Oct. 30, 2020  William Lee is Seen today for follow-up visit.  He has a history of coronary artery disease and also severe COPD.  He has mildly reduced left ventricular systolic function.  He is on Cardizem to help suppress his fast heart rate.  He does not tolerate beta-blockers.  More short of breath  Uses his home O2 at night and has a portable which he takes with him when he is out and about.  Sees pulmonary  No angina  Able to do normal activities.  Does not exercise  Able to do some yard work    Nov. 4. 2021: William Lee is seen today for follow up of his CAD ., PAD  Has COPD . Was diagnosed with lung cancer last month,  Had quit smoking years ago  Does not have a diagnosis yet,  Would need a partial pneumonectomy  which his doctor does not think he would tolerate.   Getting repeat CT scan in Jan. 2022.  Is very stable from a cardiac standpont.  No angina . He is at low risk from a cardiac standpoint to have any biopsy or procedure needed.  Has multiple family members have died of cancer.  Wt = 101  Aug 31, 2021: William Lee is seen today for follow up of his CAD , PAD , COPD  Was diagnosed with lung cancer a year ago  Wt is 104 lbs  Had XRT  - no tissue diagnosis due to the location of the mass. Treating empirically with XRT  Has an appt tomorrow to meet with oncology  No CP ,  chronic dyspnea.    Jan. 23, 2024 William Lee is seen for follow up of his CAD, PAD, COPD  Dx of lung cancer 2 years ago .  Wt is 104.  ( With jacket, and wallet )  Is anemic.  Has had 2 iron infusion  Has slow GI bleed. 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   bilateral hearing aids used   Hyperlipidemia    Hypertension    Ischemic heart disease    left Inguinal hernia 02/01/2012   Repaired 05/01/12    MI (myocardial infarction) ([facility])    Mixed hyperlipidemia 05/16/2017   Papilloma of larynx    PONV (postoperative nausea and vomiting)    Shortness of breath 11/11/2011   Oxygen @ 2L via [place] prn   SOB (shortness of breath) on exertion     Past Surgical History:  Procedure Laterality Date   CARDIAC CATHETERIZATION     CARPAL TUNNEL RELEASE Right 06/10/2021   Procedure: Right Carpal Tunnel Release;   Surgeon: Erline Levine, MD;  Location: [facility];  Service: Neurosurgery;  Laterality: Right;  RM 21   CARPAL TUNNEL RELEASE Left 08/01/2021   Procedure: Left carpal tunnel release;  Surgeon: Erline Levine, MD;  Location: [facility];  Service: Neurosurgery;  Laterality: Left;   CHOLECYSTECTOMY  12/1995   COLONOSCOPY WITH PROPOFOL N/A 09/23/2013   Procedure: COLONOSCOPY WITH PROPOFOL;  Surgeon: Winfield Cunas., MD;  Location: [facility];  Service: Endoscopy;  Laterality: N/A;   CORONARY ARTERY BYPASS GRAFT     ESOPHAGOGASTRODUODENOSCOPY (EGD) WITH PROPOFOL N/A 09/23/2013   Procedure: ESOPHAGOGASTRODUODENOSCOPY (EGD) WITH PROPOFOL;  Surgeon: Winfield Cunas., MD;  Location: [facility];  Service: Endoscopy;  Laterality: N/A;   HERNIA REPAIR     INGUINAL HERNIA REPAIR  05/01/2012   Procedure: HERNIA REPAIR INGUINAL ADULT;  Surgeon: Haywood Lasso, MD;  Location: [facility];  Service: General;  Laterality: Left;  inguinal   papalomas virus removal  3/10 7/10 5/11 10/12   PERIPHERAL VASCULAR CATHETERIZATION N/A 04/19/2016   Procedure: Abdominal Aortogram w/Lower Extremity;  Surgeon: Rosetta Posner, MD;  Location: [facility];  Service: Cardiovascular;  Laterality: N/A;     Current Outpatient Medications  Medication Sig Dispense Refill   albuterol (PROVENTIL HFA;VENTOLIN HFA) 108 (90 BASE) MCG/ACT inhaler Inhale 2 puffs into the lungs every 6 (six) hours as needed for wheezing.     aspirin 81 MG tablet Take 81 mg by mouth daily.     atorvastatin (LIPITOR) 20 MG tablet Take 20 mg by mouth daily.     Camphor-Menthol-Methyl Sal (SALONPAS EX) Apply 1 patch topically 2 (two) times daily as needed (pain).     Cholecalciferol 50 MCG (2000 UT) TABS Take 2,000 Units by mouth daily.     diltiazem (CARDIZEM CD) 120 MG 24 hr capsule Take 120 mg by mouth at bedtime.     Doxylamine-DM (VICKS NYQUIL COUGH) 6.25-15 MG/15ML LIQD Take 15 mLs by mouth 2 (two) times daily as needed  (congestion/sleep).     Fluticasone-Salmeterol (ADVAIR) 250-50 MCG/DOSE AEPB Inhale 1 puff into the lungs 2 (two) times daily. Per the [facility] PCP - [place] Patient will complete Symbicort until bottle empty then will switch to wixela on 09/23/20     Ibuprofen 200 MG CAPS  Take 600-800 mg by mouth every 6 (six) hours as needed (pain/headache/fever).     OXYGEN Inhale 2 L into the lungs as needed (During activity).     polyvinyl alcohol (LIQUIFILM TEARS) 1.4 % ophthalmic solution Place 1 drop into both eyes as needed for dry eyes.     potassium chloride 20 MEQ/15ML (10%) SOLN Take 20 mEq by mouth 2 (two) times daily.      predniSONE (DELTASONE) 20 MG tablet Take 20 mg by mouth daily as needed.     SPIRIVA HANDIHALER 18 MCG inhalation capsule Place 18 mcg into inhaler and inhale daily.     valsartan (DIOVAN) 40 MG tablet TAKE 1 TABLET BY MOUTH EVERY DAY 90 tablet 2   No current facility-administered medications for this visit.    Allergies:   Silver and Tape    Social History:  The patient  reports that he quit smoking about 32 years ago. His smoking use included cigarettes. He has never used smokeless tobacco. He reports current drug use. Drug: Marijuana. He reports that he does not drink alcohol.   Family History:  The patient's family history includes Bone cancer in his father; Cancer in his brother; Liver cancer in his mother; Lung cancer in his sister.    ROS:  Please see the history of present illness.   Otherwise, review of systems are positive for none.   All other systems are reviewed and negative.   Physical Exam: Blood pressure 128/62, pulse 78, height '5\' 3"'$  (1.6 m), weight 101 lb 6.4 oz (46 kg), SpO2 98 %.       GEN:  Well nourished, well developed in no acute distress HEENT: Normal NECK: No JVD; No carotid bruits LYMPHATICS: No lymphadenopathy CARDIAC: RRR , old sternotomy scar, soft systolic murmur  RESPIRATORY:  + rhonchi ,   ABDOMEN: Soft, non-tender,  non-distended MUSCULOSKELETAL:  No edema; No deformity  SKIN: Warm and dry NEUROLOGIC:  Alert and oriented x 3  EKG:      Jan. 23, 2024. NSR at 78.   TWI inferiorly    Recent Labs: 08/11/2022: ALT 14; BUN 18; Creatinine 1.24; Potassium 4.3; Sodium 137 11/03/2022: Hemoglobin 11.9; Platelet Count 290    Lipid Panel    Component Value Date/Time   CHOL 164 08/29/2019 1105   TRIG 126 08/29/2019 1105   HDL 57 08/29/2019 1105   CHOLHDL 2.9 08/29/2019 1105   CHOLHDL 2.5 08/11/2015 1133   VLDL 11 08/11/2015 1133   LDLCALC 85 08/29/2019 1105      Wt Readings from Last 3 Encounters:  11/21/22 101 lb 6.4 oz (46 kg)  11/03/22 103 lb 8 oz (46.9 kg)  09/01/22 102 lb 9 oz (46.5 kg)     ASSESSMENT AND PLAN:  1.  CAD with Inf. Mi ,  CABG in 1996.  No angina . Continue current meds       2.  Chronic systolic CHF:   stable     3. mild to moderate mitral regurgitation by echo.  4.  Severe COPD -   this is his primary limiting issue    6.  Hypercholesterolemia -  Current medicines are reviewed at length with the patient today.  The patient does not have concerns regarding medicines.  The following changes have been made:  no change  Labs/ tests ordered today include:   Orders Placed This Encounter  Procedures   EKG 12-Lead    I'll see him in 1 year .     Arnette Norris  Brailen Macneal, MD  11/21/2022 9:32 AM    [address],  [place], [place]  [address] Pager [phone] Phone: [phone]; Fax: [phone]

## 2022-11-21 ENCOUNTER — Ambulatory Visit: Payer: Medicare Other | Attending: Cardiovascular Disease | Admitting: Cardiovascular Disease

## 2022-11-21 ENCOUNTER — Encounter: Payer: Self-pay | Admitting: Cardiovascular Disease

## 2022-11-21 VITALS — BP 128/62 | HR 78 | Ht 63.0 in | Wt 101.4 lb

## 2022-11-21 DIAGNOSIS — I2510 Atherosclerotic heart disease of native coronary artery without angina pectoris: Secondary | ICD-10-CM | POA: Diagnosis present

## 2022-11-21 NOTE — Patient Instructions (Signed)
Medication Instructions:  Your physician recommends that you continue on your current medications as directed. Please refer to the Current Medication list given to you today.  *If you need a refill on your cardiac medications before your next appointment, please call your pharmacy*   Lab Work: none If you have labs (blood work) drawn today and your tests are completely normal, you will receive your results only by: [place] (if you have MyChart) OR A paper copy in the mail If you have any lab test that is abnormal or we need to change your treatment, we will call you to review the results.   Testing/Procedures: none   Follow-Up: At [facility], you and your health needs are our priority.  As part of our continuing mission to provide you with exceptional heart care, we have created designated Provider Care Teams.  These Care Teams include your primary Cardiologist (physician) and Advanced Practice Providers (APPs -  Physician Assistants and Nurse Practitioners) who all work together to provide you with the care you need, when you need it.  We recommend signing up for the patient portal called "MyChart".  Sign up information is provided on this After Visit Summary.  MyChart is used to connect with patients for Virtual Visits (Telemedicine).  Patients are able to view lab/test results, encounter notes, upcoming appointments, etc.  Non-urgent messages can be sent to your provider as well.   To learn more about what you can do with MyChart, go to [email or web address].    Your next appointment:   12 month(s)  Provider:   Mertie Moores, MD     Other Instructions

## 2022-12-30 ENCOUNTER — Other Ambulatory Visit: Payer: Self-pay | Admitting: Cardiovascular Disease

## 2023-01-30 ENCOUNTER — Telehealth: Payer: Self-pay | Admitting: Oncology

## 2023-01-30 NOTE — Telephone Encounter (Signed)
Rescheduled 04/05 appointments, patient has been called and notified.

## 2023-02-01 ENCOUNTER — Inpatient Hospital Stay: Payer: Medicare Other | Attending: Oncology

## 2023-02-01 ENCOUNTER — Other Ambulatory Visit: Payer: Self-pay | Admitting: Oncology

## 2023-02-01 DIAGNOSIS — Z79899 Other long term (current) drug therapy: Secondary | ICD-10-CM | POA: Diagnosis not present

## 2023-02-01 DIAGNOSIS — D500 Iron deficiency anemia secondary to blood loss (chronic): Secondary | ICD-10-CM

## 2023-02-01 LAB — FERRITIN: Ferritin: 5 ng/mL — ABNORMAL LOW (ref 24–336)

## 2023-02-01 LAB — CBC WITH DIFFERENTIAL/PLATELET
Abs Immature Granulocytes: 0.01 10*3/uL (ref 0.00–0.07)
Basophils Absolute: 0.1 10*3/uL (ref 0.0–0.1)
Basophils Relative: 1 %
Eosinophils Absolute: 0.3 10*3/uL (ref 0.0–0.5)
Eosinophils Relative: 4 %
HCT: 25.7 % — ABNORMAL LOW (ref 39.0–52.0)
Hemoglobin: 8.1 g/dL — ABNORMAL LOW (ref 13.0–17.0)
Immature Granulocytes: 0 %
Lymphocytes Relative: 8 %
Lymphs Abs: 0.6 10*3/uL — ABNORMAL LOW (ref 0.7–4.0)
MCH: 25.9 pg — ABNORMAL LOW (ref 26.0–34.0)
MCHC: 31.5 g/dL (ref 30.0–36.0)
MCV: 82.1 fL (ref 80.0–100.0)
Monocytes Absolute: 0.6 10*3/uL (ref 0.1–1.0)
Monocytes Relative: 9 %
Neutro Abs: 5.5 10*3/uL (ref 1.7–7.7)
Neutrophils Relative %: 78 %
Platelets: 342 10*3/uL (ref 150–400)
RBC: 3.13 MIL/uL — ABNORMAL LOW (ref 4.22–5.81)
RDW: 14.7 % (ref 11.5–15.5)
WBC: 7 10*3/uL (ref 4.0–10.5)
nRBC: 0 % (ref 0.0–0.2)

## 2023-02-02 ENCOUNTER — Other Ambulatory Visit: Payer: Self-pay | Admitting: *Deleted

## 2023-02-02 ENCOUNTER — Inpatient Hospital Stay: Payer: Medicare Other | Admitting: Oncology

## 2023-02-02 ENCOUNTER — Inpatient Hospital Stay (HOSPITAL_BASED_OUTPATIENT_CLINIC_OR_DEPARTMENT_OTHER): Payer: Medicare Other | Admitting: Oncology

## 2023-02-02 ENCOUNTER — Inpatient Hospital Stay: Payer: Medicare Other

## 2023-02-02 DIAGNOSIS — D500 Iron deficiency anemia secondary to blood loss (chronic): Secondary | ICD-10-CM

## 2023-02-02 DIAGNOSIS — I259 Chronic ischemic heart disease, unspecified: Secondary | ICD-10-CM

## 2023-02-02 DIAGNOSIS — D649 Anemia, unspecified: Secondary | ICD-10-CM

## 2023-02-02 DIAGNOSIS — R0602 Shortness of breath: Secondary | ICD-10-CM | POA: Diagnosis not present

## 2023-02-02 NOTE — Progress Notes (Signed)
[facility] Follow up Visit:  Patient Care Team: Leonard Downing, MD as PCP - General (Family Medicine) Nahser, Wonda Cheng, MD as PCP - Cardiology (Cardiology) Delma Freeze, Raylene Miyamoto, MD as Referring Physician (Pulmonary Disease) Jeanine Luz, MD as Referring Physician (Otolaryngology) [facility], [place] as Referring Physician ([place]) [place] as Referring Physician (General Practice) Barbee Cough, MD as Attending Physician (Hematology)  CHIEF COMPLAINTS/PURPOSE OF CONSULTATION:  HISTORY OF PRESENTING ILLNESS: William Lee 77 y.o. male is here because of anemia Medical history notable for coronary artery disease, history of MI, COPD, papilloma of larynx, GERD, carpal tunnel syndrome History of lung cancer.  EGD 2014   October 30 2019:  CT virtual colonoscopy No fixed non barium tagged polypoid filling defects or annular constricting lesions. Diffusely calcified visualized right coronary artery. Aortic atherosclerosis.  Small low-density lesions in the liver and right kidney. There is also a hyperdense lesion within the right kidney. These likely reflects cysts although these cannot be fully characterized on this noncontrast study. These could be further evaluated with ultrasound or contrast-enhanced CT as clinically indicated.  Mar 15 2021 PET scan  Redemonstrated mixed cystic and solid lesion in the left upper lobe.  Solid component has FDG avidity slightly above liver and is concerning for a primary malignancy.  No evidence of FDG avid metastatic disease.  Focus of FDG avidity in the ascending colon. Correlate with colonoscopy for further evaluation of possible neoplasm.  June 29, 2022: WBC 5.7 hemoglobin 11.1 MCV 90 platelet count 288; 72 segs 10 lymphs 9 monos 8 eos 1 basophil.  Reticulocyte count 1.7.  Iron saturation 10%  August 11 2022:  [facility]  Lung cancer was treated at [facility] in  August 2022 with SBRT.  He believes that he has been anemic for quite some time Patient is on supplemental O2 at 2 lit [place] which he uses when he is walking or sleeping.   Taking oral iron three times daily.  On ASA 81 mg daily.  Has never had PRBC's or IV iron.  Takes ibuprofen fairly often  Social:  Married.  Worked on a [employer].  Did spray painting.  Smoked up to 3 ppd of cigarettes until 1992 when noted increased SOB.  EtOH rare in the past  [facility] Mother died 53 liver cancer, occular melanoma Father died 89 cancer Brother died 80 cancer Sister died 43 lung cancer  WBC 6.9 hemoglobin 10.8 MCV 92 platelet count 295; 76 segs 9 lymphs 6 monos 6 eos 1 basophil Reticulocytes 2.0% Direct [place] test negative haptoglobin 232 SPEP with IEP showed no paraprotein serum free kappa 52 lambda 25.3 with a kappa lambda 2.06 IgG 900 IgA 165 IgM 42 CMP notable for creatinine 1.24 glucose of 107 estimated GFR 60 B12 367 folate 14.1 ferritin 11 Copper 123 zinc 73  August 21 2022:  Feraheme 510 mg IV August 28 2022:  Feraheme 510 mg IV  September 01 2022:  Scheduled follow up for anemia.  Remains fatigued despite receiving IV iron but it may be a bit early to assess response Still taking oral iron but not as frequently now once or twice daily instead of 3 times daily.  States that there is concern about safety about intubating him because of his upper airway and pulmonary status.  Claims history of ulcer noted on EGD done about 20 yrs ago  WBC 7.4 hemoglobin 11.8 platelet count 254.  Ferritin 446  November 03 2022:    Gets fatigued at times.  Finds that breathing during the cold weather is hard.  Not taking oral iron; stopped them after receiving IV iron.  Had considerable constipation with oral iron.   Labs notable for hemoglobin 11.9 MCV 93 ferritin 45  February 02 2023:   Scheduled follow up for management of anemia. Fatigued.  DOE on walking in the house.  Using his O2 more.  Will  arrange for IV iron and a unit of PRBC's.   Ferritin 5  Hgb 8.1  Review of Systems  Constitutional:  Positive for appetite change, fatigue and unexpected weight change. Negative for chills and fever.       Has been steadily loosing weight  HENT:   Positive for trouble swallowing. Negative for mouth sores, nosebleeds, sore throat and voice change.        Sometimes has trouble swallowing solids  Eyes:  Negative for eye problems and icterus.       Vision changes:  None  Respiratory:  Positive for cough, shortness of breath and wheezing. Negative for chest tightness and hemoptysis.        Usually has dry cough  Cardiovascular:  Negative for chest pain, leg swelling and palpitations.       PND:  none Orthopnea:  none  Gastrointestinal:  Positive for constipation. Negative for abdominal pain, blood in stool, diarrhea, nausea and vomiting.       Stools dark due to oral iron Has cramping due to constipation  Endocrine: Negative for hot flashes.       Cold intolerance    Genitourinary:  Negative for bladder incontinence, difficulty urinating, dysuria, frequency and hematuria.        Occasional nocturia up to 3 times  Musculoskeletal:  Positive for arthralgias, back pain and myalgias. Negative for gait problem, neck pain and neck stiffness.  Skin:  Positive for itching. Negative for rash and wound.       Itching on back and legs  Neurological:  Positive for light-headedness and numbness. Negative for dizziness, extremity weakness, gait problem, headaches and speech difficulty.       Neuropathy of hands  Hematological:  Negative for adenopathy. Bruises/bleeds easily.  Psychiatric/Behavioral:  Negative for sleep disturbance and suicidal ideas. The patient is not nervous/anxious.     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   bilateral hearing aids used   Hyperlipidemia    Hypertension    Ischemic heart disease    left Inguinal hernia 02/01/2012   Repaired 05/01/12    MI (myocardial infarction) (HCC)    Mixed hyperlipidemia 05/16/2017   Papilloma of larynx    PONV (postoperative nausea and vomiting)    Shortness of breath 11/11/2011   Oxygen @ 2L via [place] prn   SOB (shortness of breath) on exertion     SURGICAL HISTORY: Past Surgical History:  Procedure Laterality Date   CARDIAC CATHETERIZATION     CARPAL TUNNEL RELEASE Right 06/10/2021   Procedure: Right Carpal Tunnel Release;  Surgeon:  Maeola Harman, MD;  Location: [facility] OR;  Service: Neurosurgery;  Laterality: Right;  RM 21   CARPAL TUNNEL RELEASE Left 08/01/2021   Procedure: Left carpal tunnel release;  Surgeon: Maeola Harman, MD;  Location: [facility] OR;  Service: Neurosurgery;  Laterality: Left;   CHOLECYSTECTOMY  12/1995   COLONOSCOPY WITH PROPOFOL N/A 09/23/2013   Procedure: COLONOSCOPY WITH PROPOFOL;  Surgeon: Vertell Novak., MD;  Location: [facility]COPY;  Service: Endoscopy;  Laterality: N/A;   CORONARY ARTERY BYPASS GRAFT     ESOPHAGOGASTRODUODENOSCOPY (EGD) WITH PROPOFOL N/A 09/23/2013   Procedure: ESOPHAGOGASTRODUODENOSCOPY (EGD) WITH PROPOFOL;  Surgeon: Vertell Novak., MD;  Location: [facility];  Service: Endoscopy;  Laterality: N/A;   HERNIA REPAIR     INGUINAL HERNIA REPAIR  05/01/2012   Procedure: HERNIA REPAIR INGUINAL ADULT;  Surgeon: Currie Paris, MD;  Location: [facility];  Service: General;  Laterality: Left;  inguinal   papalomas virus removal  3/10 7/10 5/11 10/12   PERIPHERAL VASCULAR CATHETERIZATION N/A 04/19/2016   Procedure: Abdominal Aortogram w/Lower Extremity;   Surgeon: Larina Earthly, MD;  Location: [facility] INVASIVE CV LAB;  Service: Cardiovascular;  Laterality: N/A;    SOCIAL HISTORY: Social History   Socioeconomic History   Marital status: Married    Spouse name: Not on file   Number of children: Not on file   Years of education: Not on file   Highest education level: Not on file  Occupational History   Not on file  Tobacco Use   Smoking status: Former    Types: Cigarettes    Quit date: 10/30/1990    Years since quitting: 32.2   Smokeless tobacco: Never  Vaping Use   Vaping Use: Never used  Substance and Sexual Activity   Alcohol use: No   Drug use: Yes    Types: Marijuana    Comment: Last use 06/25/21   Sexual activity: Not on file  Other Topics Concern   Not on file  Social History Narrative   Not on file   Social Determinants of Health   Financial Resource Strain: Not on file  Food Insecurity: Not on file  Transportation Needs: Not on file  Physical Activity: Not on file  Stress: Not on file  Social Connections: Not on file  Intimate Partner Violence: Not on file    FAMILY HISTORY Family History  Problem Relation Age of Onset   Liver cancer Mother    Bone cancer Father    Lung cancer Sister    Cancer Brother        unknown    ALLERGIES:  is allergic to silver and tape.  MEDICATIONS:  Current Outpatient Medications  Medication Sig Dispense Refill   albuterol (PROVENTIL HFA;VENTOLIN HFA) 108 (90 BASE) MCG/ACT inhaler Inhale 2 puffs into the lungs every 6 (six) hours as needed for wheezing.     aspirin 81 MG tablet Take 81 mg by mouth daily.     atorvastatin (LIPITOR) 20 MG tablet Take 20 mg by mouth daily.     Camphor-Menthol-Methyl Sal (SALONPAS EX) Apply 1 patch topically 2 (two) times daily as needed (pain).     Cholecalciferol 50 MCG (2000 UT) TABS Take 2,000 Units by mouth daily.     diltiazem (CARDIZEM CD) 120 MG 24 hr capsule Take 120 mg by mouth at bedtime.     Doxylamine-DM (VICKS NYQUIL COUGH) 6.25-15  MG/15ML LIQD Take 15 mLs by mouth 2 (two) times daily as needed (congestion/sleep).  Fluticasone-Salmeterol (ADVAIR) 250-50 MCG/DOSE AEPB Inhale 1 puff into the lungs 2 (two) times daily. [facility] Patient will complete Symbicort until bottle empty then will switch to wixela on 09/23/20     Ibuprofen 200 MG CAPS Take 600-800 mg by mouth every 6 (six) hours as needed (pain/headache/fever).     OXYGEN Inhale 2 L into the lungs as needed (During activity).     polyvinyl alcohol (LIQUIFILM TEARS) 1.4 % ophthalmic solution Place 1 drop into both eyes as needed for dry eyes.     potassium chloride 20 MEQ/15ML (10%) SOLN Take 20 mEq by mouth 2 (two) times daily.      predniSONE (DELTASONE) 20 MG tablet Take 20 mg by mouth daily as needed.     SPIRIVA HANDIHALER 18 MCG inhalation capsule Place 18 mcg into inhaler and inhale daily.     valsartan (DIOVAN) 40 MG tablet TAKE 1 TABLET BY MOUTH EVERY DAY 90 tablet 3   No current facility-administered medications for this visit.    PHYSICAL EXAMINATION:  ECOG PERFORMANCE STATUS: 1 - Symptomatic but completely ambulatory   There were no vitals filed for this visit.   There were no vitals filed for this visit.    Physical Exam  As part of a telephone visit no physical exam could be conducted.   LABORATORY DATA: I have personally reviewed the data as listed:  Appointment on 02/01/2023  Component Date Value Ref Range Status   Ferritin 02/01/2023 5 (L)  24 - 336 ng/mL Final   Performed at [facility], [address], [place], [place] [address]   WBC 02/01/2023 7.0  4.0 - 10.5 K/uL Final   RBC 02/01/2023 3.13 (L)  4.22 - 5.81 MIL/uL Final   Hemoglobin 02/01/2023 8.1 (L)  13.0 - 17.0 g/dL Final   HCT 47/82/956204/01/2023 25.7 (L)  39.0 - 52.0 % Final   MCV 02/01/2023 82.1  80.0 - 100.0 fL Final   MCH 02/01/2023 25.9 (L)  26.0 - 34.0 pg Final   MCHC 02/01/2023 31.5  30.0 - 36.0 g/dL Final   RDW 13/08/657804/01/2023 14.7  11.5 -  15.5 % Final   Platelets 02/01/2023 342  150 - 400 K/uL Final   nRBC 02/01/2023 0.0  0.0 - 0.2 % Final   Neutrophils Relative % 02/01/2023 78  % Final   Neutro Abs 02/01/2023 5.5  1.7 - 7.7 K/uL Final   Lymphocytes Relative 02/01/2023 8  % Final   Lymphs Abs 02/01/2023 0.6 (L)  0.7 - 4.0 K/uL Final   Monocytes Relative 02/01/2023 9  % Final   Monocytes Absolute 02/01/2023 0.6  0.1 - 1.0 K/uL Final   Eosinophils Relative 02/01/2023 4  % Final   Eosinophils Absolute 02/01/2023 0.3  0.0 - 0.5 K/uL Final   Basophils Relative 02/01/2023 1  % Final   Basophils Absolute 02/01/2023 0.1  0.0 - 0.1 K/uL Final   Immature Granulocytes 02/01/2023 0  % Final   Abs Immature Granulocytes 02/01/2023 0.01  0.00 - 0.07 K/uL Final   Performed at [facility], [address] [place], [place] [address]    RADIOGRAPHIC STUDIES: I have personally reviewed the radiological images as listed and agree with the findings in the report  No results found.  ASSESSMENT/PLAN   77 y.o. male with medical history notable for coronary artery disease, history of MI, COPD, papilloma of larynx, GERD, carpal tunnel syndrome, history of lung cancer and tobacco use who is seen  for management of anemia  Anemia:  Multifactorial.  Major component is iron deficiency from chronic GI blood loss.  Contributing factors are chronic illness and chronic kidney disease. He is not an ideal candidate for direct endoscopy of the upper and lower GI tracts owing to severe pulmonary disease and by description upper airway abnormalities.  He has undergone virtual colonoscopy and PET scan which have been unremarkable.  A potential option is capsule endoscopy however this carries with it the risk of obstruction that could potentially require surgical intervention which would pose a grave risk to him.  Therapeutics:   Since patient had symptomatic anemia with contributions from comorbidities of cardiopulmonary disease he  received 2 doses of Feraheme 510 mg in October 2023 with improvement in hemoglobin.   November 03 2022:  Instructed patient to begin Solgar gentle iron or Flintstones multivitamin daily as tolerated to forestall the need for future doses of IV iron February 02 2023- Having symptomatic anemia.  Will arrange for patient to receive PRBC's and additional doses of IV iron.  Instructed patient to proceed to ED if develops worsening symptoms prior to the time he can be transfused.  In addition implored patient to call if he develops symptoms between visits so that we can intercede earlier  Chronic fatigue: This persist despite improvement in his hemoglobin.  Again this is likely multifactorial with a major contribution being severe cardiopulmonary disease  Lung cancer:  treated at [facility] in August 2022 with SBRT  History of tobacco use:  Smoked up to 3 ppd of cigarettes until 1992 when noted increased SOB   Cancer Staging  No matching staging information was found for the patient.   No problem-specific Assessment & Plan notes found for this encounter.   No orders of the defined types were placed in this encounter.  45  minutes was spent in patient care.  This included time spent preparing to see the patient (e.g., review of tests), obtaining and/or reviewing separately obtained history, counseling and educating the patient/family/caregiver, ordering medications, tests, documenting clinical information in the electronic or other health record, independently interpreting results and communicating results to the patient/family/caregiver as well as coordination of care.      All questions were answered. The patient knows to call the clinic with any problems, questions or concerns.  This note was electronically signed.    Loni Muse, MD  02/02/2023 12:46 PM

## 2023-02-05 ENCOUNTER — Other Ambulatory Visit: Payer: Self-pay

## 2023-02-05 ENCOUNTER — Telehealth: Payer: Self-pay | Admitting: *Deleted

## 2023-02-05 ENCOUNTER — Telehealth: Payer: Self-pay | Admitting: Oncology

## 2023-02-05 ENCOUNTER — Inpatient Hospital Stay: Payer: Medicare Other

## 2023-02-05 DIAGNOSIS — D500 Iron deficiency anemia secondary to blood loss (chronic): Secondary | ICD-10-CM | POA: Diagnosis not present

## 2023-02-05 DIAGNOSIS — D649 Anemia, unspecified: Secondary | ICD-10-CM

## 2023-02-05 LAB — CBC WITH DIFFERENTIAL (CANCER CENTER ONLY)
Abs Immature Granulocytes: 0.02 10*3/uL (ref 0.00–0.07)
Basophils Absolute: 0 10*3/uL (ref 0.0–0.1)
Basophils Relative: 1 %
Eosinophils Absolute: 0.3 10*3/uL (ref 0.0–0.5)
Eosinophils Relative: 6 %
HCT: 26.3 % — ABNORMAL LOW (ref 39.0–52.0)
Hemoglobin: 8.3 g/dL — ABNORMAL LOW (ref 13.0–17.0)
Immature Granulocytes: 0 %
Lymphocytes Relative: 12 %
Lymphs Abs: 0.7 10*3/uL (ref 0.7–4.0)
MCH: 25.3 pg — ABNORMAL LOW (ref 26.0–34.0)
MCHC: 31.6 g/dL (ref 30.0–36.0)
MCV: 80.2 fL (ref 80.0–100.0)
Monocytes Absolute: 0.5 10*3/uL (ref 0.1–1.0)
Monocytes Relative: 10 %
Neutro Abs: 3.8 10*3/uL (ref 1.7–7.7)
Neutrophils Relative %: 71 %
Platelet Count: 350 10*3/uL (ref 150–400)
RBC: 3.28 MIL/uL — ABNORMAL LOW (ref 4.22–5.81)
RDW: 14.7 % (ref 11.5–15.5)
WBC Count: 5.3 10*3/uL (ref 4.0–10.5)
nRBC: 0 % (ref 0.0–0.2)

## 2023-02-05 LAB — TYPE AND SCREEN
Unit division: 0
Unit division: 0

## 2023-02-05 LAB — ABO/RH: ABO/RH(D): A POS

## 2023-02-05 LAB — PREPARE RBC (CROSSMATCH)

## 2023-02-05 LAB — BPAM RBC

## 2023-02-05 NOTE — Telephone Encounter (Signed)
Notified by Herbert Seta w/[facility] - patient's HGB 8.3. ABO Rh and T&S drawn. Sample sent to [facility]

## 2023-02-05 NOTE — Telephone Encounter (Signed)
Called patient per 4/5 los notes to schedule f/u. Left voicemail with new appointment information and contact details if needing to reschedule.

## 2023-02-06 ENCOUNTER — Inpatient Hospital Stay: Payer: Medicare Other

## 2023-02-06 VITALS — BP 115/54 | HR 77 | Temp 98.1°F | Resp 20

## 2023-02-06 DIAGNOSIS — D500 Iron deficiency anemia secondary to blood loss (chronic): Secondary | ICD-10-CM

## 2023-02-06 DIAGNOSIS — D649 Anemia, unspecified: Secondary | ICD-10-CM

## 2023-02-06 DIAGNOSIS — I2510 Atherosclerotic heart disease of native coronary artery without angina pectoris: Secondary | ICD-10-CM

## 2023-02-06 LAB — BPAM RBC: ISSUE DATE / TIME: 202404090905

## 2023-02-06 LAB — TYPE AND SCREEN

## 2023-02-06 MED ORDER — FUROSEMIDE 10 MG/ML IJ SOLN
20.0000 mg | Freq: Once | INTRAMUSCULAR | Status: DC
  Filled 2023-02-06: qty 2

## 2023-02-06 MED ORDER — SODIUM CHLORIDE 0.9 % IV SOLN: Freq: Once | INTRAVENOUS | Status: AC

## 2023-02-06 MED ORDER — LORATADINE 10 MG PO TABS
10.0000 mg | ORAL_TABLET | Freq: Once | ORAL | Status: AC
  Administered 2023-02-06: 10 mg via ORAL
  Filled 2023-02-06: qty 1

## 2023-02-06 MED ORDER — SODIUM CHLORIDE 0.9 % IV SOLN
510.0000 mg | Freq: Once | INTRAVENOUS | Status: AC
  Administered 2023-02-06: 510 mg via INTRAVENOUS
  Filled 2023-02-06: qty 17

## 2023-02-06 MED ORDER — ACETAMINOPHEN 325 MG PO TABS
650.0000 mg | ORAL_TABLET | Freq: Once | ORAL | Status: AC
  Administered 2023-02-06: 650 mg via ORAL
  Filled 2023-02-06: qty 2

## 2023-02-06 NOTE — Patient Instructions (Signed)
Blood Transfusion, Adult A blood transfusion is a procedure in which you receive blood or a type of blood cell (blood component) through an IV. You may need a blood transfusion when you have a low blood count, which is a low number of any blood cell. This may result from a bleeding disorder, illness, injury, or surgery. The blood may come from a donor, or you may be able to have your own blood collected and stored (autologous blood donation) before a planned surgery. The blood given in a transfusion may be made up of different blood components. You may receive: Red blood cells. These carry oxygen to the cells in the body. Platelets. These help your blood to clot. Plasma. This is the liquid part of your blood. It carries proteins and other substances throughout the body. White blood cells. These help you fight infections. If you have hemophilia or another clotting disorder, you may also receive other types of blood products. Depending on the type of blood product, this procedure may take 1-4 hours to complete. Tell a health care provider about: Any bleeding problems you have. Any previous reactions you have had during a blood transfusion. Any allergies you have. All medicines you are taking, including vitamins, herbs, eye drops, creams, and over-the-counter medicines. Any surgeries you have had. Any medical conditions you have. Whether you are pregnant or may be pregnant. What are the risks? Talk with your health care provider about risks. The most common problems include: A mild allergic reaction, such as red, swollen areas of skin (hives) and itching. Fever or chills. This may be the body's response to new blood cells received. This may occur during or up to 4 hours after the transfusion. More serious problems may include: A serious allergic reaction that causes difficulty breathing or swelling around the face and lips. Transfusion-associated circulatory overload (TACO), or too much fluid in  the lungs. This may cause breathing problems. Transfusion-related acute lung injury (TRALI), which causes breathing difficulty and low oxygen in the blood. This can occur within hours of the transfusion or several days later. Iron overload. This can happen after receiving many blood transfusions over a period of time. Infection or virus being transmitted. This is rare because donated blood is carefully tested before it is given. Hemolytic transfusion reaction. This is rare. It happens when the body's defense system (immune system)tries to attack the new blood cells. Symptoms may include fever, chills, nausea, low blood pressure, and low back or chest pain. Transfusion-associated graft-versus-host disease (TAGVHD). This is rare. It happens when donated cells attack the body's healthy tissues. What happens before the procedure? You will have a blood test to check your blood type. This test is done to know what kind of blood your body will accept and to match it to the donor blood. If you are going to have a planned surgery, you may be able to do an autologous blood donation. This may be done in case you need to have a transfusion. You will have your temperature, blood pressure, and pulse checked before the transfusion. If you have had an allergic reaction to a transfusion in the past, you may be given medicine to help prevent a reaction. This medicine may be given to you by mouth (orally) or through an IV. What happens during the procedure?  An IV will be inserted into one of your veins. The bag of blood will be attached to your IV. The blood will then enter through your vein. Your temperature, blood pressure,   and pulse will be monitored during the transfusion. This monitoring is done to detect early signs of a transfusion reaction. Tell your nurse right away if you have any of these symptoms during the transfusion: Shortness of breath or trouble breathing. Chest or back pain. Fever or  chills. Itching or hives. If you have any signs or symptoms of a reaction, your transfusion will be stopped and you may be given medicine. When the transfusion is complete, your IV will be removed. Pressure may be applied to the IV site for a few minutes. A bandage (dressing)will be applied. The procedure may vary among health care providers and hospitals. What happens after the procedure? Your temperature, blood pressure, pulse, breathing rate, and blood oxygen level will be monitored until you leave the hospital or clinic. Your blood may be tested to see how you have responded to the transfusion. You may be warmed with fluids or blankets to maintain a normal body temperature. If you receive your blood transfusion in an outpatient setting, you will be told whom to contact to report any reactions. Where to find more information Visit the [employer]: redcross.org Summary A blood transfusion is a procedure in which you receive blood or a type of blood cell (blood component) through an IV. The blood given in a transfusion may be made up of different blood components. You may receive red blood cells, platelets, plasma, or white blood cells depending on the condition treated. Your temperature, blood pressure, and pulse will be monitored before, during, and after the transfusion. After the transfusion, your blood may be tested to see how your body has responded. This information is not intended to replace advice given to you by your health care provider. Make sure you discuss any questions you have with your health care provider. Document Revised: 01/13/2022 Document Reviewed: 01/13/2022 Elsevier Patient Education  2023 [employer]  Ferumoxytol Injection What is this medication? FERUMOXYTOL (FER ue MOX i tol) treats low levels of iron in your body (iron deficiency anemia). Iron is a mineral that plays an important role in making red blood cells, which carry oxygen from your lungs to the  rest of your body. This medicine may be used for other purposes; ask your health care provider or pharmacist if you have questions. COMMON BRAND NAME(S): Feraheme What should I tell my care team before I take this medication? They need to know if you have any of these conditions: Anemia not caused by low iron levels High levels of iron in the blood Magnetic resonance imaging (MRI) test scheduled An unusual or allergic reaction to iron, other medications, foods, dyes, or preservatives Pregnant or trying to get pregnant Breastfeeding How should I use this medication? This medication is injected into a vein. It is given by your care team in a hospital or clinic setting. Talk to your care team the use of this medication in children. Special care may be needed. Overdosage: If you think you have taken too much of this medicine contact a poison control center or emergency room at once. NOTE: This medicine is only for you. Do not share this medicine with others. What if I miss a dose? It is important not to miss your dose. Call your care team if you are unable to keep an appointment. What may interact with this medication? Other iron products This list may not describe all possible interactions. Give your health care provider a list of all the medicines, herbs, non-prescription drugs, or dietary supplements you use.  Also tell them if you smoke, drink alcohol, or use illegal drugs. Some items may interact with your medicine. What should I watch for while using this medication? Visit your care team regularly. Tell your care team if your symptoms do not start to get better or if they get worse. You may need blood work done while you are taking this medication. You may need to follow a special diet. Talk to your care team. Foods that contain iron include: whole grains/cereals, dried fruits, beans, or peas, leafy green vegetables, and organ meats (liver, kidney). What side effects may I notice from  receiving this medication? Side effects that you should report to your care team as soon as possible: Allergic reactions--skin rash, itching, hives, swelling of the face, lips, tongue, or throat Low blood pressure--dizziness, feeling faint or lightheaded, blurry vision Shortness of breath Side effects that usually do not require medical attention (report to your care team if they continue or are bothersome): Flushing Headache Joint pain Muscle pain Nausea Pain, redness, or irritation at injection site This list may not describe all possible side effects. Call your doctor for medical advice about side effects. You may report side effects to FDA at 1-800-FDA-1088. Where should I keep my medication? This medication is given in a hospital or clinic and will not be stored at home. NOTE: This sheet is a summary. It may not cover all possible information. If you have questions about this medicine, talk to your doctor, pharmacist, or health care provider.  2023 [employer]vier/[employer] Standard (2021-03-09 00:00:00)

## 2023-02-06 NOTE — Progress Notes (Signed)
Patient remained for 30 min observation post iron infusion. VSS, no SOB/dizziness on discharge.  Ambulatory to lobby.

## 2023-02-06 NOTE — Progress Notes (Signed)
Per Dr. Angelene Giovanni, OK to transfuse 1 unit PRBCs for Hgb 8.3.  Per MD, will not administer Lasix prior to transfusion.

## 2023-02-07 LAB — TYPE AND SCREEN

## 2023-02-07 LAB — BPAM RBC: Blood Product Expiration Date: 202404302359

## 2023-02-09 ENCOUNTER — Inpatient Hospital Stay: Payer: Medicare Other

## 2023-02-09 ENCOUNTER — Other Ambulatory Visit: Payer: Medicare Other

## 2023-02-09 ENCOUNTER — Ambulatory Visit: Payer: Medicare Other | Admitting: Physician Assistant

## 2023-02-09 LAB — BPAM RBC
Blood Product Expiration Date: 202404252359
Unit Type and Rh: 6200
Unit Type and Rh: 6200

## 2023-02-09 LAB — TYPE AND SCREEN
ABO/RH(D): A POS
Antibody Screen: NEGATIVE

## 2023-02-15 ENCOUNTER — Other Ambulatory Visit: Payer: Self-pay

## 2023-02-15 ENCOUNTER — Inpatient Hospital Stay: Payer: Medicare Other

## 2023-02-15 ENCOUNTER — Encounter: Payer: Self-pay | Admitting: Oncology

## 2023-02-15 DIAGNOSIS — D500 Iron deficiency anemia secondary to blood loss (chronic): Secondary | ICD-10-CM

## 2023-02-15 LAB — CBC WITH DIFFERENTIAL/PLATELET
Abs Immature Granulocytes: 0.02 10*3/uL (ref 0.00–0.07)
Basophils Absolute: 0 10*3/uL (ref 0.0–0.1)
Basophils Relative: 0 %
Eosinophils Absolute: 0.3 10*3/uL (ref 0.0–0.5)
Eosinophils Relative: 4 %
HCT: 34 % — ABNORMAL LOW (ref 39.0–52.0)
Hemoglobin: 10.6 g/dL — ABNORMAL LOW (ref 13.0–17.0)
Immature Granulocytes: 0 %
Lymphocytes Relative: 9 %
Lymphs Abs: 0.6 10*3/uL — ABNORMAL LOW (ref 0.7–4.0)
MCH: 26.5 pg (ref 26.0–34.0)
MCHC: 31.2 g/dL (ref 30.0–36.0)
MCV: 85 fL (ref 80.0–100.0)
Monocytes Absolute: 0.6 10*3/uL (ref 0.1–1.0)
Monocytes Relative: 8 %
Neutro Abs: 5.7 10*3/uL (ref 1.7–7.7)
Neutrophils Relative %: 79 %
Platelets: 311 10*3/uL (ref 150–400)
RBC: 4 MIL/uL — ABNORMAL LOW (ref 4.22–5.81)
RDW: 19.4 % — ABNORMAL HIGH (ref 11.5–15.5)
WBC: 7.2 10*3/uL (ref 4.0–10.5)
nRBC: 0 % (ref 0.0–0.2)

## 2023-02-15 LAB — FERRITIN: Ferritin: 129 ng/mL (ref 24–336)

## 2023-02-16 ENCOUNTER — Inpatient Hospital Stay: Payer: Medicare Other

## 2023-02-16 VITALS — BP 110/53 | HR 72 | Temp 98.1°F | Resp 17

## 2023-02-16 DIAGNOSIS — I2510 Atherosclerotic heart disease of native coronary artery without angina pectoris: Secondary | ICD-10-CM

## 2023-02-16 DIAGNOSIS — D500 Iron deficiency anemia secondary to blood loss (chronic): Secondary | ICD-10-CM | POA: Diagnosis not present

## 2023-02-16 MED ORDER — SODIUM CHLORIDE 0.9 % IV SOLN
510.0000 mg | Freq: Once | INTRAVENOUS | Status: AC
  Administered 2023-02-16: 510 mg via INTRAVENOUS
  Filled 2023-02-16: qty 510

## 2023-02-16 MED ORDER — ACETAMINOPHEN 325 MG PO TABS
650.0000 mg | ORAL_TABLET | Freq: Once | ORAL | Status: AC
  Administered 2023-02-16: 650 mg via ORAL
  Filled 2023-02-16: qty 2

## 2023-02-16 MED ORDER — SODIUM CHLORIDE 0.9 % IV SOLN: Freq: Once | INTRAVENOUS | Status: AC

## 2023-02-16 MED ORDER — LORATADINE 10 MG PO TABS
10.0000 mg | ORAL_TABLET | Freq: Once | ORAL | Status: AC
  Administered 2023-02-16: 10 mg via ORAL
  Filled 2023-02-16: qty 1

## 2023-02-16 NOTE — Progress Notes (Signed)
Per Dr Angelene Giovanni, iron only today with HGB 10.6

## 2023-02-16 NOTE — Progress Notes (Signed)
Pt declined to stay for 30 min post-observation.  Discharge to lobby in stable condition with family

## 2023-02-23 ENCOUNTER — Inpatient Hospital Stay: Payer: Medicare Other

## 2023-03-01 ENCOUNTER — Other Ambulatory Visit: Payer: Self-pay | Admitting: Oncology

## 2023-03-01 DIAGNOSIS — D500 Iron deficiency anemia secondary to blood loss (chronic): Secondary | ICD-10-CM

## 2023-03-01 NOTE — Progress Notes (Signed)
[facility] Cancer Follow up Visit:  Patient Care Team: Kaleen Mask, MD as PCP - General (Family Medicine) Nahser, Deloris Ping, MD as PCP - Cardiology (Cardiology) Larence Penning, Erven Colla, MD as Referring Physician (Pulmonary Disease) Dayna Ramus, MD as Referring Physician (Otolaryngology) Center[facility]erring Physician (General Practice) [facility] Referring Physician (General Practice) Loni Muse, MD as Attending Physician (Hematology)  CHIEF COMPLAINTS/PURPOSE OF CONSULTATION:  HISTORY OF PRESENTING ILLNESS: William Lee 77 y.o. male is here because of anemia Medical history notable for coronary artery disease, history of MI, COPD, papilloma of larynx, GERD, carpal tunnel syndrome History of lung cancer.  EGD 2014   October 30 2019:  CT virtual colonoscopy No fixed non barium tagged polypoid filling defects or annular constricting lesions. Diffusely calcified visualized right coronary artery. Aortic atherosclerosis.  Small low-density lesions in the liver and right kidney. There is also a hyperdense lesion within the right kidney. These likely reflects cysts although these cannot be fully characterized on this noncontrast study. These could be further evaluated with ultrasound or contrast-enhanced CT as clinically indicated.  Mar 15 2021 PET scan  Redemonstrated mixed cystic and solid lesion in the left upper lobe.  Solid component has FDG avidity slightly above liver and is concerning for a primary malignancy.  No evidence of FDG avid metastatic disease.  Focus of FDG avidity in the ascending colon. Correlate with colonoscopy for further evaluation of possible neoplasm.  June 29, 2022: WBC 5.7 hemoglobin 11.1 MCV 90 platelet count 288; 72 segs 10 lymphs 9 monos 8 eos 1 basophil.  Reticulocyte count 1.7.  Iron saturation 10%  August 11 2022:  [place] [facility] Consult  Lung cancer was treated at [facility] in  August 2022 with SBRT.  He believes that he has been anemic for quite some time Patient is on supplemental O2 at 2 lit [place] which he uses when he is walking or sleeping.   Taking oral iron three times daily.  On ASA 81 mg daily.  Has never had PRBC's or IV iron.  Takes ibuprofen fairly often  Social:  Married.  Worked on a [employer].  Did spray painting.  Smoked up to 3 ppd of cigarettes until 1992 when noted increased SOB.  EtOH rare in the past  [facility] Mother died 30 liver cancer, occular melanoma Father died 25 cancer Brother died 11 cancer Sister died 26 lung cancer  WBC 6.9 hemoglobin 10.8 MCV 92 platelet count 295; 76 segs 9 lymphs 6 monos 6 eos 1 basophil Reticulocytes 2.0% Direct Coombs test negative haptoglobin 232 SPEP with IEP showed no paraprotein serum free kappa 52 lambda 25.3 with a kappa lambda 2.06 IgG 900 IgA 165 IgM 42 CMP notable for creatinine 1.24 glucose of 107 estimated GFR 60 B12 367 folate 14.1 ferritin 11 Copper 123 zinc 73  August 21 2022:  Feraheme 510 mg IV August 28 2022:  Feraheme 510 mg IV  September 01 2022:  Scheduled follow up for anemia.  Remains fatigued despite receiving IV iron but it may be a bit early to assess response Still taking oral iron but not as frequently now once or twice daily instead of 3 times daily.  States that there is concern about safety about intubating him because of his upper airway and pulmonary status.  Claims history of ulcer noted on EGD done about 20 yrs ago  WBC 7.4 hemoglobin 11.8 platelet count 254.  Ferritin 446  November 03 2022:    Gets fatigued at times.  Finds that breathing during the cold weather is hard.  Not taking oral iron; stopped them after receiving IV iron.  Had considerable constipation with oral iron.   Labs notable for hemoglobin 11.9 MCV 93 ferritin 45  February 02 2023:   . Fatigued.  DOE on walking in the house.  Using his O2 more.  Will arrange for IV iron and a unit of PRBC's.    Ferritin 5  Hgb 8.1  February 06 2023:  Feraheme 510 mg February 16 2023:  Feraheme 510 mg  Mar 02 2023:  Scheduled follow up for management of anemia.  Reviewed results of imaging with patient and wife.  Recommend GI consult for evaluation of IDA which will likely not consist of invasive procedures due to underlying lung disease.    Hgb 11.04 June 2023 For follow up chest CT, pulmonary follow up.   Review of Systems  Constitutional:  Positive for appetite change, fatigue and unexpected weight change. Negative for chills and fever.       Has been steadily loosing weight  HENT:   Positive for trouble swallowing. Negative for mouth sores, nosebleeds, sore throat and voice change.        Sometimes has trouble swallowing solids  Eyes:  Negative for eye problems and icterus.       Vision changes:  None  Respiratory:  Positive for cough, shortness of breath and wheezing. Negative for chest tightness and hemoptysis.        Usually has dry cough  Cardiovascular:  Negative for chest pain, leg swelling and palpitations.       PND:  none Orthopnea:  none  Gastrointestinal:  Positive for constipation. Negative for abdominal pain, blood in stool, diarrhea, nausea and vomiting.       Stools dark due to oral iron Has cramping due to constipation  Endocrine: Negative for hot flashes.       Cold intolerance    Genitourinary:  Negative for bladder incontinence, difficulty urinating, dysuria, frequency and hematuria.        Occasional nocturia up to 3 times  Musculoskeletal:  Positive for arthralgias, back pain and myalgias. Negative for gait problem, neck pain and neck stiffness.  Skin:  Positive for itching. Negative for rash and wound.       Itching on back and legs  Neurological:  Positive for light-headedness and numbness. Negative for dizziness, extremity weakness, gait problem, headaches and speech difficulty.       Neuropathy of hands  Hematological:  Negative for adenopathy. Bruises/bleeds  easily.  Psychiatric/Behavioral:  Negative for sleep disturbance and suicidal ideas. The patient is not nervous/anxious.     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   bilateral hearing aids used   Hyperlipidemia    Hypertension    Ischemic heart disease    left Inguinal hernia 02/01/2012   Repaired 05/01/12    MI (myocardial infarction) (HCC)    Mixed hyperlipidemia 05/16/2017  Papilloma of larynx    PONV (postoperative nausea and vomiting)    Shortness of breath 11/11/2011   Oxygen @ 2L via [place] prn   SOB (shortness of breath) on exertion     SURGICAL HISTORY: Past Surgical History:  Procedure Laterality Date   CARDIAC CATHETERIZATION     CARPAL TUNNEL RELEASE Right 06/10/2021   Procedure: Right Carpal Tunnel Release;  Surgeon: Maeola Harman, MD;  Location: [facility] OR;  Service: Neurosurgery;  Laterality: Right;  RM 21   CARPAL TUNNEL RELEASE Left 08/01/2021   Procedure: Left carpal tunnel release;  Surgeon: Maeola Harman, MD;  Location: [facility] OR;  Service: Neurosurgery;  Laterality: Left;   CHOLECYSTECTOMY  12/1995   COLONOSCOPY WITH PROPOFOL N/A 09/23/2013   Procedure: COLONOSCOPY WITH PROPOFOL;  Surgeon: Vertell Novak., MD;  Location: [facility];  Service: Endoscopy;  Laterality: N/A;   CORONARY ARTERY BYPASS GRAFT     ESOPHAGOGASTRODUODENOSCOPY (EGD) WITH PROPOFOL N/A 09/23/2013   Procedure: ESOPHAGOGASTRODUODENOSCOPY (EGD) WITH PROPOFOL;  Surgeon: Vertell Novak., MD;  Location: [facility];  Service: Endoscopy;  Laterality: N/A;   HERNIA REPAIR      INGUINAL HERNIA REPAIR  05/01/2012   Procedure: HERNIA REPAIR INGUINAL ADULT;  Surgeon: Currie Paris, MD;  Location: [facility];  Service: General;  Laterality: Left;  inguinal   papalomas virus removal  3/10 7/10 5/11 10/12   PERIPHERAL VASCULAR CATHETERIZATION N/A 04/19/2016   Procedure: Abdominal Aortogram w/Lower Extremity;  Surgeon: Larina Earthly, MD;  Location: [facility] INVASIVE CV LAB;  Service: Cardiovascular;  Laterality: N/A;    SOCIAL HISTORY: Social History   Socioeconomic History   Marital status: Married    Spouse name: Not on file   Number of children: Not on file   Years of education: Not on file   Highest education level: Not on file  Occupational History   Not on file  Tobacco Use   Smoking status: Former    Types: Cigarettes    Quit date: 10/30/1990    Years since quitting: 32.3   Smokeless tobacco: Never  Vaping Use   Vaping Use: Never used  Substance and Sexual Activity   Alcohol use: No   Drug use: Yes    Types: Marijuana    Comment: Last use 06/25/21   Sexual activity: Not on file  Other Topics Concern   Not on file  Social History Narrative   Not on file   Social Determinants of Health   Financial Resource Strain: Not on file  Food Insecurity: Not on file  Transportation Needs: Not on file  Physical Activity: Not on file  Stress: Not on file  Social Connections: Not on file  Intimate Partner Violence: Not on file    FAMILY HISTORY Family History  Problem Relation Age of Onset   Liver cancer Mother    Bone cancer Father    Lung cancer Sister    Cancer Brother        unknown    ALLERGIES:  is allergic to silver and tape.  MEDICATIONS:  Current Outpatient Medications  Medication Sig Dispense Refill   albuterol (PROVENTIL HFA;VENTOLIN HFA) 108 (90 BASE) MCG/ACT inhaler Inhale 2 puffs into the lungs every 6 (six) hours as needed for wheezing.     aspirin 81 MG tablet Take 81 mg by mouth daily.     atorvastatin (LIPITOR) 20 MG  tablet Take 20 mg by mouth daily.     Camphor-Menthol-Methyl Sal (SALONPAS EX) Apply 1  patch topically 2 (two) times daily as needed (pain).     Cholecalciferol 50 MCG (2000 UT) TABS Take 2,000 Units by mouth daily.     diltiazem (CARDIZEM CD) 120 MG 24 hr capsule Take 120 mg by mouth at bedtime.     Doxylamine-DM (VICKS NYQUIL COUGH) 6.25-15 MG/15ML LIQD Take 15 mLs by mouth 2 (two) times daily as needed (congestion/sleep).     Fluticasone-Salmeterol (ADVAIR) 250-50 MCG/DOSE AEPB Inhale 1 puff into the lungs 2 (two) times daily. [facility] Patient will complete Symbicort until bottle empty then will switch to wixela on 09/23/20     gabapentin (NEURONTIN) 300 MG capsule Take 300 mg by mouth 2 (two) times daily.     Ibuprofen 200 MG CAPS Take 600-800 mg by mouth every 6 (six) hours as needed (pain/headache/fever).     OXYGEN Inhale 2 L into the lungs as needed (During activity).     polyvinyl alcohol (LIQUIFILM TEARS) 1.4 % ophthalmic solution Place 1 drop into both eyes as needed for dry eyes.     potassium chloride 20 MEQ/15ML (10%) SOLN Take 20 mEq by mouth 2 (two) times daily.      predniSONE (DELTASONE) 20 MG tablet Take 20 mg by mouth daily as needed.     SPIRIVA HANDIHALER 18 MCG inhalation capsule Place 18 mcg into inhaler and inhale daily.     valsartan (DIOVAN) 40 MG tablet TAKE 1 TABLET BY MOUTH EVERY DAY 90 tablet 3   No current facility-administered medications for this visit.    PHYSICAL EXAMINATION:  ECOG PERFORMANCE STATUS: 1 - Symptomatic but completely ambulatory   There were no vitals filed for this visit.   There were no vitals filed for this visit.    Physical Exam  As part of a telephone visit no physical exam could be conducted.   LABORATORY DATA: I have personally reviewed the data as listed:  Appointment on 02/15/2023  Component Date Value Ref Range Status   Ferritin 02/15/2023 129  24 - 336 ng/mL Final   Performed at [name], [address] [place], [place] [address]   WBC 02/15/2023 7.2  4.0 - 10.5 K/uL Final   RBC 02/15/2023 4.00 (L)  4.22 - 5.81 MIL/uL Final   Hemoglobin 02/15/2023 10.6 (L)  13.0 - 17.0 g/dL Final   HCT 62/13/0865 34.0 (L)  39.0 - 52.0 % Final   MCV 02/15/2023 85.0  80.0 - 100.0 fL Final   MCH 02/15/2023 26.5  26.0 - 34.0 pg Final   MCHC 02/15/2023 31.2  30.0 - 36.0 g/dL Final   RDW 78/46/9629 19.4 (H)  11.5 - 15.5 % Final   Platelets 02/15/2023 311  150 - 400 K/uL Final   nRBC 02/15/2023 0.0  0.0 - 0.2 % Final   Neutrophils Relative % 02/15/2023 79  % Final   Neutro Abs 02/15/2023 5.7  1.7 - 7.7 K/uL Final   Lymphocytes Relative 02/15/2023 9  % Final   Lymphs Abs 02/15/2023 0.6 (L)  0.7 - 4.0 K/uL Final   Monocytes Relative 02/15/2023 8  % Final   Monocytes Absolute 02/15/2023 0.6  0.1 - 1.0 K/uL Final   Eosinophils Relative 02/15/2023 4  % Final   Eosinophils Absolute 02/15/2023 0.3  0.0 - 0.5 K/uL Final   Basophils Relative 02/15/2023 0  % Final   Basophils Absolute 02/15/2023 0.0  0.0 - 0.1 K/uL Final   Immature Granulocytes 02/15/2023 0  % Final   Abs  Immature Granulocytes 02/15/2023 0.02  0.00 - 0.07 K/uL Final   Performed at [facility], [address], [place], [place] [address]  Orders Only on 02/05/2023  Component Date Value Ref Range Status   Order Confirmation 02/05/2023    Final                   Value:ORDER PROCESSED BY BLOOD BANK Performed at [facility], [address] [place], [place] [address]   Appointment on 02/05/2023  Component Date Value Ref Range Status   WBC Count 02/05/2023 5.3  4.0 - 10.5 K/uL Final   RBC 02/05/2023 3.28 (L)  4.22 - 5.81 MIL/uL Final   Hemoglobin 02/05/2023 8.3 (L)  13.0 - 17.0 g/dL Final   HCT 69/62/9528 26.3 (L)  39.0 - 52.0 % Final   MCV 02/05/2023 80.2  80.0 - 100.0 fL Final   MCH 02/05/2023 25.3 (L)  26.0 - 34.0 pg Final   MCHC 02/05/2023 31.6  30.0 - 36.0 g/dL Final    RDW 41/32/4401 14.7  11.5 - 15.5 % Final   Platelet Count 02/05/2023 350  150 - 400 K/uL Final   nRBC 02/05/2023 0.0  0.0 - 0.2 % Final   Neutrophils Relative % 02/05/2023 71  % Final   Neutro Abs 02/05/2023 3.8  1.7 - 7.7 K/uL Final   Lymphocytes Relative 02/05/2023 12  % Final   Lymphs Abs 02/05/2023 0.7  0.7 - 4.0 K/uL Final   Monocytes Relative 02/05/2023 10  % Final   Monocytes Absolute 02/05/2023 0.5  0.1 - 1.0 K/uL Final   Eosinophils Relative 02/05/2023 6  % Final   Eosinophils Absolute 02/05/2023 0.3  0.0 - 0.5 K/uL Final   Basophils Relative 02/05/2023 1  % Final   Basophils Absolute 02/05/2023 0.0  0.0 - 0.1 K/uL Final   Immature Granulocytes 02/05/2023 0  % Final   Abs Immature Granulocytes 02/05/2023 0.02  0.00 - 0.07 K/uL Final   Performed at [facility], [address], [place], [place] [address]   ABO/RH(D) 02/05/2023 A POS   Final   Antibody Screen 02/05/2023 NEG   Final   Sample Expiration 02/05/2023 02/08/2023,2359   Final   Unit Number 02/05/2023 [id number]   Final   Blood Component Type 02/05/2023 RED CELLS,LR   Final   Unit division 02/05/2023 00   Final   Status of Unit 02/05/2023 REL FROM [facility]   Final   Transfusion Status 02/05/2023 OK TO TRANSFUSE   Final   Crossmatch Result 02/05/2023    Final                   Value:Compatible Performed at [facility], [address] [place], [place] [address]    Unit Number 02/05/2023 [id number]   Final   Blood Component Type 02/05/2023 RED CELLS,LR   Final   Unit division 02/05/2023 00   Final   Status of Unit 02/05/2023 [facility]   Final   Transfusion Status 02/05/2023 OK TO TRANSFUSE   Final   Crossmatch Result 02/05/2023 Compatible   Final   ABO/RH(D) 02/05/2023    Final                   Value:A POS Performed at [facility], [address], [place], [place] [address]    Blood Product Unit Number 02/05/2023 [id number]   Final    PRODUCT CODE 02/05/2023 [id number]   Final  Unit Type and Rh 02/05/2023 6200   Final   Blood Product Expiration Date 02/05/2023 [id number]   Final   ISSUE DATE / TIME 02/05/2023 [id number]   Final   Blood Product Unit Number 02/05/2023 [id number]   Final   PRODUCT CODE 02/05/2023 [id number]   Final   Unit Type and Rh 02/05/2023 6200   Final   Blood Product Expiration Date 02/05/2023 [id number]   Final  Appointment on 02/01/2023  Component Date Value Ref Range Status   Ferritin 02/01/2023 5 (L)  24 - 336 ng/mL Final   Performed at [facility], [address], [place], [place] [address]   WBC 02/01/2023 7.0  4.0 - 10.5 K/uL Final   RBC 02/01/2023 3.13 (L)  4.22 - 5.81 MIL/uL Final   Hemoglobin 02/01/2023 8.1 (L)  13.0 - 17.0 g/dL Final   HCT 59/56/3875 25.7 (L)  39.0 - 52.0 % Final   MCV 02/01/2023 82.1  80.0 - 100.0 fL Final   MCH 02/01/2023 25.9 (L)  26.0 - 34.0 pg Final   MCHC 02/01/2023 31.5  30.0 - 36.0 g/dL Final   RDW 64/33/2951 14.7  11.5 - 15.5 % Final   Platelets 02/01/2023 342  150 - 400 K/uL Final   nRBC 02/01/2023 0.0  0.0 - 0.2 % Final   Neutrophils Relative % 02/01/2023 78  % Final   Neutro Abs 02/01/2023 5.5  1.7 - 7.7 K/uL Final   Lymphocytes Relative 02/01/2023 8  % Final   Lymphs Abs 02/01/2023 0.6 (L)  0.7 - 4.0 K/uL Final   Monocytes Relative 02/01/2023 9  % Final   Monocytes Absolute 02/01/2023 0.6  0.1 - 1.0 K/uL Final   Eosinophils Relative 02/01/2023 4  % Final   Eosinophils Absolute 02/01/2023 0.3  0.0 - 0.5 K/uL Final   Basophils Relative 02/01/2023 1  % Final   Basophils Absolute 02/01/2023 0.1  0.0 - 0.1 K/uL Final   Immature Granulocytes 02/01/2023 0  % Final   Abs Immature Granulocytes 02/01/2023 0.01  0.00 - 0.07 K/uL Final   Performed at [facility], [address] [place], [place] [address]    RADIOGRAPHIC STUDIES: I have personally reviewed the radiological images as listed and agree with the  findings in the report  No results found.  ASSESSMENT/PLAN   77 y.o. male with medical history notable for coronary artery disease, history of MI, COPD, papilloma of larynx, GERD, carpal tunnel syndrome, history of lung cancer and tobacco use who is seen for management of anemia  Anemia:  Multifactorial.  Major component is iron deficiency from chronic GI blood loss.  Contributing factors are chronic illness and chronic kidney disease. He is not an ideal candidate for direct endoscopy of the upper and lower GI tracts owing to severe pulmonary disease and by description upper airway abnormalities.  He has undergone virtual colonoscopy and PET scan which have been unremarkable.  A potential option is capsule endoscopy however this carries with it the risk of obstruction that could potentially require surgical intervention which would pose a grave risk to him.  Therapeutics:   Since patient had symptomatic anemia with contributions from comorbidities of cardiopulmonary disease he received 2 doses of Feraheme 510 mg in October 2023 with improvement in hemoglobin.   November 03 2022:  Instructed patient to begin Solgar gentle iron or Flintstones multivitamin daily as tolerated to forestall the need for future doses of IV iron February 02 2023- Having symptomatic anemia.  Arranged for  PRBC's and additional doses of IV iron.  Instructed patient to proceed to ED if develops worsening symptoms prior to the time he can be transfused.  In addition implored patient to call if he develops symptoms between visits so that we can intercede earlier February 06 2023- Feraheme 510 mg  February 16 2023- Feraheme 510 mg Mar 02 2023- Ferritin 137.  Hgb 11.5.  No need for PRBC's or IV iron at this time.  Recommend GI consult for evaluation of IDA which will likely not consist of invasive procedures due to underlying lung disease.  (Frail elderly)   Chronic fatigue: This persist despite improvement in his hemoglobin.  Again this is  likely multifactorial with a major contribution being severe cardiopulmonary disease  Lung cancer:  treated at [facility] in August 2022 with SBRT  August 2024 For follow up chest CT, pulmonary follow up.   History of tobacco use:  Smoked up to 3 ppd of cigarettes until 1992 when noted increased SOB   Cancer Staging  No matching staging information was found for the patient.   No problem-specific Assessment & Plan notes found for this encounter.   No orders of the defined types were placed in this encounter.  30  minutes was spent in patient care.  This included time spent preparing to see the patient (e.g., review of tests), obtaining and/or reviewing separately obtained history, counseling and educating the patient/family/caregiver, ordering medications, tests, documenting clinical information in the electronic or other health record, independently interpreting results and communicating results to the patient/family/caregiver as well as coordination of care.      All questions were answered. The patient knows to call the clinic with any problems, questions or concerns.  This note was electronically signed.    Loni Muse, MD  03/01/2023 12:12 PM

## 2023-03-02 ENCOUNTER — Inpatient Hospital Stay (HOSPITAL_BASED_OUTPATIENT_CLINIC_OR_DEPARTMENT_OTHER): Payer: Medicare Other | Admitting: Oncology

## 2023-03-02 ENCOUNTER — Inpatient Hospital Stay: Payer: Medicare Other | Attending: Oncology

## 2023-03-02 VITALS — BP 133/71 | HR 80 | Temp 98.1°F | Resp 15 | Wt 105.6 lb

## 2023-03-02 DIAGNOSIS — I2510 Atherosclerotic heart disease of native coronary artery without angina pectoris: Secondary | ICD-10-CM | POA: Diagnosis not present

## 2023-03-02 DIAGNOSIS — Z800 Family history of malignant neoplasm of digestive organs: Secondary | ICD-10-CM | POA: Diagnosis not present

## 2023-03-02 DIAGNOSIS — Z85118 Personal history of other malignant neoplasm of bronchus and lung: Secondary | ICD-10-CM | POA: Diagnosis not present

## 2023-03-02 DIAGNOSIS — J4489 Other specified chronic obstructive pulmonary disease: Secondary | ICD-10-CM | POA: Diagnosis not present

## 2023-03-02 DIAGNOSIS — E782 Mixed hyperlipidemia: Secondary | ICD-10-CM | POA: Diagnosis not present

## 2023-03-02 DIAGNOSIS — D500 Iron deficiency anemia secondary to blood loss (chronic): Secondary | ICD-10-CM

## 2023-03-02 DIAGNOSIS — R54 Age-related physical debility: Secondary | ICD-10-CM

## 2023-03-02 DIAGNOSIS — Z9049 Acquired absence of other specified parts of digestive tract: Secondary | ICD-10-CM | POA: Diagnosis not present

## 2023-03-02 DIAGNOSIS — Z79899 Other long term (current) drug therapy: Secondary | ICD-10-CM | POA: Insufficient documentation

## 2023-03-02 DIAGNOSIS — I252 Old myocardial infarction: Secondary | ICD-10-CM | POA: Insufficient documentation

## 2023-03-02 DIAGNOSIS — R0602 Shortness of breath: Secondary | ICD-10-CM | POA: Diagnosis not present

## 2023-03-02 DIAGNOSIS — I129 Hypertensive chronic kidney disease with stage 1 through stage 4 chronic kidney disease, or unspecified chronic kidney disease: Secondary | ICD-10-CM | POA: Insufficient documentation

## 2023-03-02 DIAGNOSIS — J449 Chronic obstructive pulmonary disease, unspecified: Secondary | ICD-10-CM

## 2023-03-02 DIAGNOSIS — K219 Gastro-esophageal reflux disease without esophagitis: Secondary | ICD-10-CM | POA: Diagnosis not present

## 2023-03-02 DIAGNOSIS — Z801 Family history of malignant neoplasm of trachea, bronchus and lung: Secondary | ICD-10-CM | POA: Diagnosis not present

## 2023-03-02 DIAGNOSIS — Z87891 Personal history of nicotine dependence: Secondary | ICD-10-CM | POA: Insufficient documentation

## 2023-03-02 DIAGNOSIS — R5382 Chronic fatigue, unspecified: Secondary | ICD-10-CM | POA: Insufficient documentation

## 2023-03-02 LAB — CBC WITH DIFFERENTIAL/PLATELET
Abs Immature Granulocytes: 0.02 10*3/uL (ref 0.00–0.07)
Basophils Absolute: 0 10*3/uL (ref 0.0–0.1)
Basophils Relative: 0 %
Eosinophils Absolute: 0.4 10*3/uL (ref 0.0–0.5)
Eosinophils Relative: 7 %
HCT: 36.8 % — ABNORMAL LOW (ref 39.0–52.0)
Hemoglobin: 11.5 g/dL — ABNORMAL LOW (ref 13.0–17.0)
Immature Granulocytes: 0 %
Lymphocytes Relative: 10 %
Lymphs Abs: 0.5 10*3/uL — ABNORMAL LOW (ref 0.7–4.0)
MCH: 27.3 pg (ref 26.0–34.0)
MCHC: 31.3 g/dL (ref 30.0–36.0)
MCV: 87.4 fL (ref 80.0–100.0)
Monocytes Absolute: 0.4 10*3/uL (ref 0.1–1.0)
Monocytes Relative: 8 %
Neutro Abs: 3.6 10*3/uL (ref 1.7–7.7)
Neutrophils Relative %: 75 %
Platelets: 251 10*3/uL (ref 150–400)
RBC: 4.21 MIL/uL — ABNORMAL LOW (ref 4.22–5.81)
RDW: 22.8 % — ABNORMAL HIGH (ref 11.5–15.5)
WBC: 4.9 10*3/uL (ref 4.0–10.5)
nRBC: 0 % (ref 0.0–0.2)

## 2023-03-02 LAB — SAMPLE TO BLOOD BANK

## 2023-03-02 LAB — FERRITIN: Ferritin: 137 ng/mL (ref 24–336)

## 2023-03-09 ENCOUNTER — Encounter: Payer: Self-pay | Admitting: Physician Assistant

## 2023-03-19 ENCOUNTER — Encounter: Payer: Self-pay | Admitting: Oncology

## 2023-03-19 DIAGNOSIS — R54 Age-related physical debility: Secondary | ICD-10-CM | POA: Insufficient documentation

## 2023-03-30 ENCOUNTER — Other Ambulatory Visit: Payer: Self-pay

## 2023-03-30 ENCOUNTER — Inpatient Hospital Stay (HOSPITAL_BASED_OUTPATIENT_CLINIC_OR_DEPARTMENT_OTHER): Payer: Medicare Other | Admitting: Physician Assistant

## 2023-03-30 ENCOUNTER — Inpatient Hospital Stay: Payer: Medicare Other

## 2023-03-30 VITALS — BP 129/70 | HR 83 | Temp 99.1°F | Resp 18 | Ht 63.0 in | Wt 106.0 lb

## 2023-03-30 DIAGNOSIS — D500 Iron deficiency anemia secondary to blood loss (chronic): Secondary | ICD-10-CM

## 2023-03-30 DIAGNOSIS — D508 Other iron deficiency anemias: Secondary | ICD-10-CM

## 2023-03-30 LAB — CBC WITH DIFFERENTIAL/PLATELET
Abs Immature Granulocytes: 0.02 10*3/uL (ref 0.00–0.07)
Basophils Absolute: 0.1 10*3/uL (ref 0.0–0.1)
Basophils Relative: 1 %
Eosinophils Absolute: 0.4 10*3/uL (ref 0.0–0.5)
Eosinophils Relative: 6 %
HCT: 35.9 % — ABNORMAL LOW (ref 39.0–52.0)
Hemoglobin: 11.8 g/dL — ABNORMAL LOW (ref 13.0–17.0)
Immature Granulocytes: 0 %
Lymphocytes Relative: 9 %
Lymphs Abs: 0.6 10*3/uL — ABNORMAL LOW (ref 0.7–4.0)
MCH: 28.9 pg (ref 26.0–34.0)
MCHC: 32.9 g/dL (ref 30.0–36.0)
MCV: 88 fL (ref 80.0–100.0)
Monocytes Absolute: 0.5 10*3/uL (ref 0.1–1.0)
Monocytes Relative: 7 %
Neutro Abs: 5.3 10*3/uL (ref 1.7–7.7)
Neutrophils Relative %: 77 %
Platelets: 289 10*3/uL (ref 150–400)
RBC: 4.08 MIL/uL — ABNORMAL LOW (ref 4.22–5.81)
RDW: 21.4 % — ABNORMAL HIGH (ref 11.5–15.5)
WBC: 6.8 10*3/uL (ref 4.0–10.5)
nRBC: 0 % (ref 0.0–0.2)

## 2023-03-30 LAB — SAMPLE TO BLOOD BANK

## 2023-03-30 LAB — IRON AND IRON BINDING CAPACITY (CC-WL,HP ONLY)
Iron: 61 ug/dL (ref 45–182)
Saturation Ratios: 19 % (ref 17.9–39.5)
TIBC: 316 ug/dL (ref 250–450)
UIBC: 255 ug/dL (ref 117–376)

## 2023-03-30 LAB — FERRITIN: Ferritin: 38 ng/mL (ref 24–336)

## 2023-03-30 NOTE — Progress Notes (Unsigned)
[facility] [phone]   [phone]  PROGRESS NOTE  Patient Care Team: Kaleen Mask, MD as PCP - General (Family Medicine) Nahser, Deloris Ping, MD as PCP - Cardiology (Cardiology) Larence Penning Erven Colla, MD as Referring Physician (Pulmonary Disease) Dayna Ramus, MD as Referring Physician (Otolaryngology) [facility] as Referring Physician [facility] (General Practice) Loni Muse, MD as Attending Physician (Hematology)  Hematological/Oncological History #Iron deficiency anemia: --October 2023: Received IV feraheme 510 mg x 2 doses --11/03/2022: Instructed to begin OTC PO iron supplementation --02/02/2023: Presented with symptomatic anemia. -02/06/2023-02/16/2023: Received IV feraheme 510 mg x 2 doses  #Lung cancer: --Treated with SBRT in August 2022 at [facility]. --Most recent CT chest from 12/15/2022 showed no evidence of local recurrence or metastatic disease.   CHIEF COMPLAINTS/PURPOSE OF CONSULTATION:  Iron deficiency anemia  HISTORY OF PRESENTING ILLNESS:  William Lee 77 y.o. male returns for a follow up for iron deficiency anemia. He was last seen by Dr. Angelene Giovanni on 03/02/2023. In the interim, he denies any changes to his health.   On exam today, William Lee reports that he is doing much better since receiving IV iron. His energy has improved and he is more active. He requires less of his supplemental oxygen, only when he is exerting himself. He denies any signs of overt bleeding including hematochezia, melena or hematuria. Patient is otherwise feeling well without any new symptoms. He denies fevers, chills, sweats, chest pain, cough, nausea, vomiting, diarrhea, constipation, headaches or dizziness. Rest of the ROS is below.   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   bilateral hearing aids used   Hyperlipidemia    Hypertension    Ischemic heart disease    left Inguinal hernia 02/01/2012   Repaired 05/01/12    MI (myocardial infarction) (HCC)    Mixed hyperlipidemia 05/16/2017   Papilloma of larynx    PONV (postoperative nausea and vomiting)    Shortness of breath 11/11/2011   Oxygen @ 2L via [place] prn   SOB (shortness of breath) on exertion     SURGICAL HISTORY: Past Surgical History:  Procedure Laterality Date   CARDIAC CATHETERIZATION     CARPAL TUNNEL RELEASE Right 06/10/2021   Procedure: Right Carpal Tunnel Release;  Surgeon: Maeola Harman, MD;  Location: [facility] OR;  Service: Neurosurgery;  Laterality: Right;  RM 21   CARPAL TUNNEL RELEASE Left 08/01/2021   Procedure: Left carpal tunnel release;  Surgeon: Maeola Harman, MD;  Location: [facility] OR;  Service: Neurosurgery;  Laterality: Left;   CHOLECYSTECTOMY  12/1995   COLONOSCOPY WITH PROPOFOL N/A 09/23/2013   Procedure: COLONOSCOPY WITH PROPOFOL;  Surgeon: Vertell Novak., MD;  Location: [facility];  Service: Endoscopy;  Laterality: N/A;   CORONARY ARTERY BYPASS GRAFT     ESOPHAGOGASTRODUODENOSCOPY (EGD) WITH PROPOFOL N/A 09/23/2013   Procedure: ESOPHAGOGASTRODUODENOSCOPY (EGD) WITH PROPOFOL;  Surgeon: Vertell Novak., MD;  Location: [facility];  Service: Endoscopy;  Laterality: N/A;  HERNIA REPAIR     INGUINAL HERNIA REPAIR  05/01/2012   Procedure: HERNIA REPAIR INGUINAL ADULT;  Surgeon: Currie Paris, MD;  Location:  SURGERY CENTER;  Service: General;  Laterality: Left;  inguinal   papalomas virus removal  3/10 7/10 5/11 10/12   PERIPHERAL VASCULAR  CATHETERIZATION N/A 04/19/2016   Procedure: Abdominal Aortogram w/Lower Extremity;  Surgeon: Larina Earthly, MD;  Location: [facility] INVASIVE CV LAB;  Service: Cardiovascular;  Laterality: N/A;    SOCIAL HISTORY: Social History   Socioeconomic History   Marital status: Married    Spouse name: Not on file   Number of children: Not on file   Years of education: Not on file   Highest education level: Not on file  Occupational History   Not on file  Tobacco Use   Smoking status: Former    Types: Cigarettes    Quit date: 10/30/1990    Years since quitting: 32.4   Smokeless tobacco: Never  Vaping Use   Vaping Use: Never used  Substance and Sexual Activity   Alcohol use: No   Drug use: Yes    Types: Marijuana    Comment: Last use 06/25/21   Sexual activity: Not on file  Other Topics Concern   Not on file  Social History Narrative   Not on file   Social Determinants of Health   Financial Resource Strain: Not on file  Food Insecurity: Not on file  Transportation Needs: Not on file  Physical Activity: Not on file  Stress: Not on file  Social Connections: Not on file  Intimate Partner Violence: Not on file    FAMILY HISTORY: Family History  Problem Relation Age of Onset   Liver cancer Mother    Bone cancer Father    Lung cancer Sister    Cancer Brother        unknown    ALLERGIES:  is allergic to silver and tape.  MEDICATIONS:  Current Outpatient Medications  Medication Sig Dispense Refill   albuterol (PROVENTIL HFA;VENTOLIN HFA) 108 (90 BASE) MCG/ACT inhaler Inhale 2 puffs into the lungs every 6 (six) hours as needed for wheezing.     aspirin 81 MG tablet Take 81 mg by mouth daily.     atorvastatin (LIPITOR) 20 MG tablet Take 20 mg by mouth daily.     Camphor-Menthol-Methyl Sal (SALONPAS EX) Apply 1 patch topically 2 (two) times daily as needed (pain).     Cholecalciferol 50 MCG (2000 UT) TABS Take 2,000 Units by mouth daily.     diltiazem (CARDIZEM CD) 120 MG 24 hr capsule  Take 120 mg by mouth at bedtime.     Doxylamine-DM (VICKS NYQUIL COUGH) 6.25-15 MG/15ML LIQD Take 15 mLs by mouth 2 (two) times daily as needed (congestion/sleep).     Fluticasone-Salmeterol (ADVAIR) 250-50 MCG/DOSE AEPB Inhale 1 puff into the lungs 2 (two) times daily. [facility] Patient will complete Symbicort until bottle empty then will switch to wixela on 09/23/20     gabapentin (NEURONTIN) 300 MG capsule Take 300 mg by mouth 2 (two) times daily.     Ibuprofen 200 MG CAPS Take 600-800 mg by mouth every 6 (six) hours as needed (pain/headache/fever).     OXYGEN Inhale 2 L into the lungs as needed (During activity).     polyvinyl alcohol (LIQUIFILM TEARS) 1.4 % ophthalmic solution Place 1 drop into both eyes as needed for dry eyes.     potassium chloride 20 MEQ/15ML (10%)  SOLN Take 20 mEq by mouth 2 (two) times daily.      predniSONE (DELTASONE) 20 MG tablet Take 20 mg by mouth daily as needed.     SPIRIVA HANDIHALER 18 MCG inhalation capsule Place 18 mcg into inhaler and inhale daily.     valsartan (DIOVAN) 40 MG tablet TAKE 1 TABLET BY MOUTH EVERY DAY 90 tablet 3   No current facility-administered medications for this visit.    REVIEW OF SYSTEMS:   Constitutional: ( - ) fevers, ( - )  chills , ( - ) night sweats Eyes: ( - ) blurriness of vision, ( - ) double vision, ( - ) watery eyes Ears, nose, mouth, throat, and face: ( - ) mucositis, ( - ) sore throat Respiratory: ( - ) cough, ( + ) dyspnea, ( - ) wheezes Cardiovascular: ( - ) palpitation, ( - ) chest discomfort, ( - ) lower extremity swelling Gastrointestinal:  ( - ) nausea, ( - ) heartburn, ( - ) change in bowel habits Skin: ( - ) abnormal skin rashes Lymphatics: ( - ) new lymphadenopathy, ( +) easy bruising Neurological: ( - ) numbness, ( - ) tingling, ( - ) new weaknesses Behavioral/Psych: ( - ) mood change, ( - ) new changes  All other systems were reviewed with the patient and are negative.  PHYSICAL  EXAMINATION: ECOG PERFORMANCE STATUS: 1 - Symptomatic but completely ambulatory  Vitals:   03/30/23 1451  BP: 129/70  Pulse: 83  Resp: 18  Temp: 99.1 F (37.3 C)  SpO2: 99%   Filed Weights   03/30/23 1451  Weight: 106 lb (48.1 kg)    GENERAL: well appearing male in NAD  SKIN: skin color, texture, turgor are normal, no rashes or significant lesions EYES: conjunctiva are pink and non-injected, sclera clear OROPHARYNX: no exudate, no erythema; lips, buccal mucosa, and tongue normal  LYMPH:  no palpable lymphadenopathy in the cervical or supraclavicular lymph nodes.  LUNGS: clear to auscultation and percussion with normal breathing effort HEART: regular rate & rhythm and no murmurs and no lower extremity edema Musculoskeletal: no cyanosis of digits and no clubbing  PSYCH: alert & oriented x 3, fluent speech NEURO: no focal motor/sensory deficits  LABORATORY DATA:  I have reviewed the data as listed    Latest Ref Rng & Units 03/30/2023    2:24 PM 03/02/2023    2:53 PM 02/15/2023    1:14 PM  CBC  WBC 4.0 - 10.5 K/uL 6.8  4.9  7.2   Hemoglobin 13.0 - 17.0 g/dL 60.4  54.0  98.1   Hematocrit 39.0 - 52.0 % 35.9  36.8  34.0   Platelets 150 - 400 K/uL 289  251  311        Latest Ref Rng & Units 08/11/2022    2:30 PM 08/01/2021   11:24 AM 06/06/2021    1:42 PM  CMP  Glucose 70 - 99 mg/dL 191  478  295   BUN 8 - 23 mg/dL 18  18  18    Creatinine 0.61 - 1.24 mg/dL 6.21  3.08  6.57   Sodium 135 - 145 mmol/L 137  135  135   Potassium 3.5 - 5.1 mmol/L 4.3  4.0  4.5   Chloride 98 - 111 mmol/L 106  105  101   CO2 22 - 32 mmol/L 27  22  24    Calcium 8.9 - 10.3 mg/dL 8.9  9.0  9.2   Total Protein 6.5 - 8.1  g/dL 7.0   7.0   Total Bilirubin 0.3 - 1.2 mg/dL 0.9   1.1   Alkaline Phos 38 - 126 U/L 84   83   AST 15 - 41 U/L 17   17   ALT 0 - 44 U/L 14   9      ASSESSMENT & PLAN William Lee is a 77 y.o. male who returns for a follow up for iron deficiency anemia.   #Iron deficiency  anemia: --etiology unknown. Scheduled for GI consultation on 05/22/2023.  --most recently underwent CT virtual colonoscopy on 10/30/2019 which showed no fixed non barium tagged polypoid filling defects or annular constricting lesions identified.  --most recently received IV feraheme 510 mg x 2 doses on 02/06/2023 and 02/16/2023. --labs today show anemia continues to slowly improve with Hgb 11.8, MCV 88.0. Iron panel shows iron 61, TIBC 316, saturation 19%. Ferritin levels have dropped 38 --Recommend IV feraheme x 1 dose to bolster iron levels. --RTC in 6 weeks with labs only and 12 weeks with labs/follow up.   Orders Placed This Encounter  Procedures   Iron and Iron Binding Capacity ([facility] only)    Standing Status:   Future    Number of Occurrences:   1    Standing Expiration Date:   03/29/2024    All questions were answered. The patient knows to call the clinic with any problems, questions or concerns.  I have spent a total of 25 minutes minutes of face-to-face and non-face-to-face time, preparing to see the patient, performing a medically appropriate examination, counseling and educating the patient, documenting clinical information in the electronic health record, and care coordination.   Georga Kaufmann, PA-C Department of Hematology/Oncology [facility] at [facility] Phone: [phone]

## 2023-04-02 ENCOUNTER — Encounter: Payer: Self-pay | Admitting: Physician Assistant

## 2023-04-02 ENCOUNTER — Telehealth: Payer: Self-pay | Admitting: Oncology

## 2023-04-02 ENCOUNTER — Telehealth: Payer: Self-pay

## 2023-04-02 DIAGNOSIS — D509 Iron deficiency anemia, unspecified: Secondary | ICD-10-CM | POA: Insufficient documentation

## 2023-04-02 NOTE — Telephone Encounter (Signed)
-----   Message from Briant Cedar, PA-C sent at 04/02/2023  8:45 AM EDT ----- Please notify patient that ferritin levels dropped so we will arrange for IV feraheme x 1 dose to bolster levels.

## 2023-04-02 NOTE — Telephone Encounter (Signed)
Pt advised of lab results and the need for IV iron.  He is aware after insurance approval scheduling will call him to make the appt.

## 2023-04-06 ENCOUNTER — Other Ambulatory Visit: Payer: Self-pay | Admitting: Physician Assistant

## 2023-04-06 ENCOUNTER — Inpatient Hospital Stay: Payer: Medicare Other | Attending: Oncology

## 2023-04-06 VITALS — BP 117/54 | HR 71 | Temp 97.8°F | Resp 18

## 2023-04-06 DIAGNOSIS — D508 Other iron deficiency anemias: Secondary | ICD-10-CM

## 2023-04-06 DIAGNOSIS — Z79899 Other long term (current) drug therapy: Secondary | ICD-10-CM | POA: Diagnosis not present

## 2023-04-06 DIAGNOSIS — D500 Iron deficiency anemia secondary to blood loss (chronic): Secondary | ICD-10-CM | POA: Diagnosis present

## 2023-04-06 MED ORDER — SODIUM CHLORIDE 0.9 % IV SOLN
510.0000 mg | Freq: Once | INTRAVENOUS | Status: AC
  Administered 2023-04-06: 510 mg via INTRAVENOUS
  Filled 2023-04-06: qty 510

## 2023-04-06 MED ORDER — ALTEPLASE 2 MG IJ SOLR: 2.0000 mg | Freq: Once | INTRAMUSCULAR | Status: DC | PRN

## 2023-04-06 MED ORDER — SODIUM CHLORIDE 0.9% FLUSH: 10.0000 mL | Freq: Once | INTRAVENOUS | Status: DC | PRN

## 2023-04-06 MED ORDER — SODIUM CHLORIDE 0.9 % IV SOLN: Freq: Once | INTRAVENOUS | Status: AC

## 2023-04-06 MED ORDER — HEPARIN SOD (PORK) LOCK FLUSH 100 UNIT/ML IV SOLN: 250.0000 [IU] | Freq: Once | INTRAVENOUS | Status: DC | PRN

## 2023-04-06 MED ORDER — HEPARIN SOD (PORK) LOCK FLUSH 100 UNIT/ML IV SOLN: 500.0000 [IU] | Freq: Once | INTRAVENOUS | Status: DC | PRN

## 2023-04-06 MED ORDER — SODIUM CHLORIDE 0.9% FLUSH: 3.0000 mL | Freq: Once | INTRAVENOUS | Status: DC | PRN

## 2023-04-06 MED ORDER — LORATADINE 10 MG PO TABS
10.0000 mg | ORAL_TABLET | Freq: Once | ORAL | Status: AC
  Administered 2023-04-06: 10 mg via ORAL
  Filled 2023-04-06: qty 1

## 2023-04-06 MED ORDER — ACETAMINOPHEN 325 MG PO TABS
650.0000 mg | ORAL_TABLET | Freq: Once | ORAL | Status: AC
  Administered 2023-04-06: 650 mg via ORAL
  Filled 2023-04-06: qty 2

## 2023-04-06 NOTE — Patient Instructions (Signed)

## 2023-05-18 ENCOUNTER — Other Ambulatory Visit: Payer: Self-pay

## 2023-05-18 ENCOUNTER — Inpatient Hospital Stay: Payer: Medicare Other | Attending: Oncology

## 2023-05-18 DIAGNOSIS — Z79899 Other long term (current) drug therapy: Secondary | ICD-10-CM | POA: Insufficient documentation

## 2023-05-18 DIAGNOSIS — D500 Iron deficiency anemia secondary to blood loss (chronic): Secondary | ICD-10-CM | POA: Insufficient documentation

## 2023-05-18 LAB — CBC WITH DIFFERENTIAL (CANCER CENTER ONLY)
Abs Immature Granulocytes: 0.04 10*3/uL (ref 0.00–0.07)
Basophils Absolute: 0 10*3/uL (ref 0.0–0.1)
Basophils Relative: 1 %
Eosinophils Absolute: 0.3 10*3/uL (ref 0.0–0.5)
Eosinophils Relative: 5 %
HCT: 33.5 % — ABNORMAL LOW (ref 39.0–52.0)
Hemoglobin: 11.3 g/dL — ABNORMAL LOW (ref 13.0–17.0)
Immature Granulocytes: 1 %
Lymphocytes Relative: 9 %
Lymphs Abs: 0.5 10*3/uL — ABNORMAL LOW (ref 0.7–4.0)
MCH: 31.7 pg (ref 26.0–34.0)
MCHC: 33.7 g/dL (ref 30.0–36.0)
MCV: 93.8 fL (ref 80.0–100.0)
Monocytes Absolute: 0.4 10*3/uL (ref 0.1–1.0)
Monocytes Relative: 7 %
Neutro Abs: 4.7 10*3/uL (ref 1.7–7.7)
Neutrophils Relative %: 77 %
Platelet Count: 262 10*3/uL (ref 150–400)
RBC: 3.57 MIL/uL — ABNORMAL LOW (ref 4.22–5.81)
RDW: 14.7 % (ref 11.5–15.5)
WBC Count: 6 10*3/uL (ref 4.0–10.5)
nRBC: 0 % (ref 0.0–0.2)

## 2023-05-18 LAB — IRON AND IRON BINDING CAPACITY (CC-WL,HP ONLY)
Iron: 47 ug/dL (ref 45–182)
Saturation Ratios: 15 % — ABNORMAL LOW (ref 17.9–39.5)
TIBC: 314 ug/dL (ref 250–450)
UIBC: 267 ug/dL (ref 117–376)

## 2023-05-18 LAB — FERRITIN: Ferritin: 30 ng/mL (ref 24–336)

## 2023-05-21 ENCOUNTER — Other Ambulatory Visit: Payer: Self-pay | Admitting: Physician Assistant

## 2023-05-21 ENCOUNTER — Telehealth: Payer: Self-pay

## 2023-05-21 NOTE — Telephone Encounter (Signed)
-----   Message from Briant Cedar sent at 05/21/2023  1:13 PM EDT ----- Regarding: IV iron needed Myriam Jacobson: Please notify patient that labs show iron levels are declining and Dr. Angelene Giovanni recommends another round of IV iron.   Cynthia: Please schedule IV feraheme 510 mg once a week x 2 doses. Preferably get treatment before follow up visit on 8/9

## 2023-05-21 NOTE — Telephone Encounter (Signed)
Pt advised of lab results and the need for IV iron. Scheduling will contact pt after insurance approval

## 2023-05-21 NOTE — Progress Notes (Addendum)
05/22/2023 William Lee [id number] [birth year]  Referring provider: Kaleen Mask, * Primary GI doctor: Dr. Barron Alvine  ASSESSMENT AND PLAN:   Iron deficiency anemia, unspecified iron deficiency anemia type With ongoing iron deficiency anemia requiring IV iron, FOBT+ Will start back on protonix 40 mg daily for possible GERD as source for bleeding Continue supportive care Patient very high risk for endoscopic procedures, had a very long discussion with the patient and family member in regards to endoscopic evaluation for history of IDA, FOBT + and I discussed risk of electrolyte abnormalities, arrhythmias, heart failure, lung failure including need for ventilation, stroke and heart attack and death but patient states he is interested in pursuing colonoscopy and endoscopy for the iron deficiency anemia for therapeutic purposes.  Will plan in the hospital with Dr. Corey Skains  CEA from [facility] elevated at 18.9  History of adenomatous polyp of colon 2014 2 cm piecemeal TA polyp 2020 virtual colon with no fixed on barium tag polypoid filling defects or annular constricting lesions.  However did have non mobile filling defect noted in the mid to distal transverse colon, possible adherent stool 2022 PET scan avidity in the ascending colon Due to this information and FOBT, IDA, likely would benefit from repeat colon  Gastroesophageal reflux disease without esophagitis Lifestyle changes discussed, avoid NSAIDS, ETOH Start on protonix 40 mg daily Emphasized PPI 30 minutes to an hour before food  Severe chronic obstructive pulmonary disease (HCC) with history of lung cancer, Chronic hypoxemic respiratory failure (HCC) Getting PFTs in Aug again, last PFT 05/2022 PEV1 80% He uses O2 to sleep and as needed, he did walk in here without O2 and was at 95%  Patient Care Team: Kaleen Mask, MD as PCP - General (Family Medicine) Nahser, Deloris Ping, MD as PCP -  Cardiology (Cardiology) Pugh, Erven Colla, MD as Referring Physician (Pulmonary Disease) Noel Gerold Joanna Puff, MD as Referring Physician (Otolaryngology) [facility] as Referring Physician (General Practice) [facility] as Referring Physician (General Practice) Loni Muse, MD as Attending Physician (Hematology)  HISTORY OF PRESENT ILLNESS: 77 y.o. male with a past medical history of CAD previous bypass 16, COPD on oxygen at home, PVD, lung cancer diagnosis 2022, dyslipidemia, previous papilloma of larynx, anemia and others listed below presents for evaluation of IDA.   09/23/2013 EGD and colonoscopy at the hospital with Dr. Randa Evens for FOBT positive stool-multiple polyps removed ascending, transverse and sigmoid colon, 2 cm polyp transverse colon piecemeal removed with net.  Internal hemorrhoids EGD unremarkable.  Small hiatal hernia.   All polyps were tubular adenomatous polyps. 10/30/2019 CT virtual colonoscopy with ordered by [facility] GI Dr. showed no fixed on barium tag polypoid filling defects or annular constricting lesions.  Diverticula.There is a non mobile filling defect noted in the mid to distal transverse colon, but this appears to be tagged with barium and likely adherent stool. 03/15/2021 CT PET at [facility] showed Focus of FDG avidity in the ascending colon. Correlate with colonoscopy for further evaluation of possible neoplasm  11/21/2022 office visit with Dr. Elease Hashimoto 03/30/2023 office visit with hematology oncology, lung cancer treated with SBRT in August 22 at [facility] most recent CT no evidence of local recurrence or metastasis 12/15/2022.    Patient had multiple doses of IV iron, October 2023 and most recently 02/06/19/2024 had 2 doses. 02/01/23  HGB 8.1 Ferritin 5, s/p blood transfusion and he felt better.  03/30/2023 Hgb 11.8, MCV 88, iron panel  iron 61, saturation ratio 19, ferritin levels 38 05/18/2023 HGB 11.3  MCV 93.8 Iron 47 Ferritin 30 B12 367 He is  getting two more transfusions scheduled tomorrow.   He states he had PFTs a year ago, getting again August 23rd.  He was having fatigue, DOE prior to April, he states he felt much better after the 1 unit PRBC. He has been having iron infusions but still has DOE.  When he was on iron pills, had darker stool but other wise no melena no hematochezia but states the stool can be reddish/brown intermittent  He was on omeprazole 40 mg BID since 2011, he just stopped 2 years ago, doctor tapered him off.  He has had more indigestion/GERD, no dysphagia, no AB pain.  He will now take omeprazole PRN.  PFTs 06/21/2022 FEV1 0.80, FEV1/FVC 31.51 He states he can walk 4 car lengths with DOE, will have to stop but will not need O2.  He denies blood thinner use, he is on bASA.  Takes Advil PRN, twice a month.  He denies ETOH use.   He denies tobacco use, Quit 32 years ago.  He reports drug use.    He  reports that he quit smoking about 32 years ago. His smoking use included cigarettes. He has never used smokeless tobacco. He reports current drug use. Drug: Marijuana. He reports that he does not drink alcohol.  RELEVANT LABS AND IMAGING: CBC    Component Value Date/Time   WBC 6.0 05/18/2023 1353   WBC 6.8 03/30/2023 1424   RBC 3.57 (L) 05/18/2023 1353   HGB 11.3 (L) 05/18/2023 1353   HCT 33.5 (L) 05/18/2023 1353   PLT 262 05/18/2023 1353   MCV 93.8 05/18/2023 1353   MCH 31.7 05/18/2023 1353   MCHC 33.7 05/18/2023 1353   RDW 14.7 05/18/2023 1353   LYMPHSABS 0.5 (L) 05/18/2023 1353   MONOABS 0.4 05/18/2023 1353   EOSABS 0.3 05/18/2023 1353   BASOSABS 0.0 05/18/2023 1353   Recent Labs    08/11/22 1430 09/01/22 1512 11/03/22 1307 02/01/23 1412 02/05/23 1209 02/15/23 1314 03/02/23 1453 03/30/23 1424 05/18/23 1353  HGB 10.8* 11.8* 11.9* 8.1* 8.3* 10.6* 11.5* 11.8* 11.3*    CMP     Component Value Date/Time   NA 137 08/11/2022 1430   NA 137 08/29/2019 1105   K 4.3 08/11/2022 1430   CL  106 08/11/2022 1430   CO2 27 08/11/2022 1430   GLUCOSE 107 (H) 08/11/2022 1430   BUN 18 08/11/2022 1430   BUN 17 08/29/2019 1105   CREATININE 1.24 08/11/2022 1430   CREATININE 1.09 08/11/2015 1133   CALCIUM 8.9 08/11/2022 1430   PROT 7.0 08/11/2022 1430   PROT 6.7 08/29/2019 1105   ALBUMIN 4.1 08/11/2022 1430   ALBUMIN 4.4 08/29/2019 1105   AST 17 08/11/2022 1430   ALT 14 08/11/2022 1430   ALKPHOS 84 08/11/2022 1430   BILITOT 0.9 08/11/2022 1430   GFRNONAA 60 (L) 08/11/2022 1430   GFRAA 64 08/29/2019 1105      Latest Ref Rng & Units 08/11/2022    2:30 PM 06/06/2021    1:42 PM 08/29/2019   11:05 AM  Hepatic Function  Total Protein 6.5 - 8.1 g/dL 7.0  7.0  6.7   Albumin 3.5 - 5.0 g/dL 4.1  3.4  4.4   AST 15 - 41 U/L 17  17  17    ALT 0 - 44 U/L 14  9  11    Alk Phosphatase 38 - 126  U/L 84  83  78   Total Bilirubin 0.3 - 1.2 mg/dL 0.9  1.1  1.5   Bilirubin, Direct 0.00 - 0.40 mg/dL   0.98       Current Medications:   Current Outpatient Medications (Endocrine & Metabolic):    predniSONE (DELTASONE) 20 MG tablet, Take 20 mg by mouth daily as needed. (Patient not taking: Reported on 05/22/2023)  Current Outpatient Medications (Cardiovascular):    atorvastatin (LIPITOR) 20 MG tablet, Take 20 mg by mouth daily.   diltiazem (CARDIZEM CD) 120 MG 24 hr capsule, Take 120 mg by mouth at bedtime.   valsartan (DIOVAN) 40 MG tablet, TAKE 1 TABLET BY MOUTH EVERY DAY  Current Outpatient Medications (Respiratory):    albuterol (PROVENTIL HFA;VENTOLIN HFA) 108 (90 BASE) MCG/ACT inhaler, Inhale 2 puffs into the lungs every 6 (six) hours as needed for wheezing.   Fluticasone-Salmeterol (ADVAIR) 250-50 MCG/DOSE AEPB, Inhale 1 puff into the lungs 2 (two) times daily. [facility] Patient will complete Symbicort until bottle empty then will switch to wixela on 09/23/20   SPIRIVA HANDIHALER 18 MCG inhalation capsule, Place 18 mcg into inhaler and inhale daily.   Doxylamine-DM (VICKS  NYQUIL COUGH) 6.25-15 MG/15ML LIQD, Take 15 mLs by mouth 2 (two) times daily as needed (congestion/sleep). (Patient not taking: Reported on 05/22/2023)  Current Outpatient Medications (Analgesics):    aspirin 81 MG tablet, Take 81 mg by mouth daily.   Ibuprofen 200 MG CAPS, Take 600-800 mg by mouth every 6 (six) hours as needed (pain/headache/fever).   Current Outpatient Medications (Other):    Camphor-Menthol-Methyl Sal (SALONPAS EX), Apply 1 patch topically 2 (two) times daily as needed (pain).   Cholecalciferol 50 MCG (2000 UT) TABS, Take 2,000 Units by mouth daily.   gabapentin (NEURONTIN) 300 MG capsule, Take 300 mg by mouth 2 (two) times daily.   OXYGEN, Inhale 2 L into the lungs as needed (During activity).   pantoprazole (PROTONIX) 40 MG tablet, Take 1 tablet (40 mg total) by mouth daily.   polyvinyl alcohol (LIQUIFILM TEARS) 1.4 % ophthalmic solution, Place 1 drop into both eyes as needed for dry eyes.   potassium chloride 20 MEQ/15ML (10%) SOLN, Take 20 mEq by mouth 2 (two) times daily.   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  Surgical History:  He  has a past surgical history that includes Coronary artery bypass graft; Cholecystectomy (12/1995); papalomas virus removal (3/10 7/10 5/11 10/12); Inguinal hernia repair (05/01/2012); Hernia repair; Esophagogastroduodenoscopy (egd) with propofol (N/A, 09/23/2013); Colonoscopy with propofol (N/A, 09/23/2013); Cardiac catheterization; Cardiac catheterization (N/A, 04/19/2016); Carpal tunnel release (Right, 06/10/2021); and Carpal tunnel release (Left, 08/01/2021). Family History:  His family history includes Bone cancer in his father; Cancer in his brother; Liver cancer in his mother; Lung cancer in his sister.  REVIEW OF SYSTEMS  : All other systems reviewed and negative except where noted in the History of Present Illness.  PHYSICAL EXAM: BP 132/78 (BP Location: Left Arm, Patient Position: Sitting, Cuff Size: Normal)   Pulse 80   Ht 5\' 3"  (1.6 m)   Wt 106 lb 8 oz (48.3 kg)   SpO2 95%   BMI 18.87 kg/m  General Appearance: Thin appearing, in no apparent distress. Head:   Normocephalic and atraumatic. Eyes:  sclerae anicteric,conjunctive pink  Respiratory: Barrel chest, decreased BS, no wheezing, not on Oxygen, on 95% RA Cardio: RRR with no MRGs. Peripheral pulses intact.  Abdomen: Soft,  Flat ,active bowel sounds. No tenderness . Without guarding and Without rebound. No masses. Rectal: Normal external rectal exam, normal rectal tone, no internal hemorrhoids appreciated, no masses, non tender, brown stool, hemoccult Positive Musculoskeletal: Full ROM, Normal gait. Without edema. Skin:  Dry and intact without significant lesions or rashes Neuro: Alert and  oriented x4;  No focal deficits. Psych:  Cooperative. Normal mood and affect.    Doree Albee, PA-C 2:45 PM

## 2023-05-22 ENCOUNTER — Encounter: Payer: Self-pay | Admitting: Physician Assistant

## 2023-05-22 ENCOUNTER — Ambulatory Visit (INDEPENDENT_AMBULATORY_CARE_PROVIDER_SITE_OTHER): Payer: Medicare Other | Admitting: Physician Assistant

## 2023-05-22 VITALS — BP 132/78 | HR 80 | Ht 63.0 in | Wt 106.5 lb

## 2023-05-22 DIAGNOSIS — D509 Iron deficiency anemia, unspecified: Secondary | ICD-10-CM

## 2023-05-22 DIAGNOSIS — Z86010 Personal history of colonic polyps: Secondary | ICD-10-CM | POA: Diagnosis not present

## 2023-05-22 DIAGNOSIS — J9611 Chronic respiratory failure with hypoxia: Secondary | ICD-10-CM

## 2023-05-22 DIAGNOSIS — J449 Chronic obstructive pulmonary disease, unspecified: Secondary | ICD-10-CM

## 2023-05-22 DIAGNOSIS — Z85118 Personal history of other malignant neoplasm of bronchus and lung: Secondary | ICD-10-CM

## 2023-05-22 DIAGNOSIS — K219 Gastro-esophageal reflux disease without esophagitis: Secondary | ICD-10-CM

## 2023-05-22 MED ORDER — PANTOPRAZOLE SODIUM 40 MG PO TBEC: 40.0000 mg | DELAYED_RELEASE_TABLET | Freq: Every day | ORAL | 3 refills | Status: DC

## 2023-05-22 MED ORDER — NA SULFATE-K SULFATE-MG SULF 17.5-3.13-1.6 GM/177ML PO SOLN: 1.0000 | Freq: Once | ORAL | 0 refills | Status: AC

## 2023-05-22 NOTE — Patient Instructions (Addendum)
WE HAVE YOU SCHEDULED FOR A FOLLOW UP ON 08/09/2023 AT 11:00AM.  We have scheduled you a pre visit with the nurse for your procedure on 08/15/2023 at 2:00am     You have been scheduled for a colonoscopy. Please follow written instructions given to you at your visit today.   Please pick up your prep supplies at the pharmacy within the next 1-3 days.  If you use inhalers (even only as needed), please bring them with you on the day of your procedure.  DO NOT TAKE 7 DAYS PRIOR TO TEST- Trulicity (dulaglutide) Ozempic, Wegovy (semaglutide) Mounjaro (tirzepatide) Bydureon Bcise (exanatide extended release)  DO NOT TAKE 1 DAY PRIOR TO YOUR TEST Rybelsus (semaglutide) Adlyxin (lixisenatide) Victoza (liraglutide) Byetta (exanatide) ___________________________________________________________________________   Advised to go to the ER if there is any severe weakness, severe abdominal pain, vomit blood, dark red blood in your bowel movement, shortness of breath or chest pain.   Please take your proton pump inhibitor medication, get on protonix  Please take this medication 30 minutes to 1 hour before meals- this makes it more effective.  Avoid spicy and acidic foods Avoid fatty foods Limit your intake of coffee, tea, alcohol, and carbonated drinks Work to maintain a healthy weight Keep the head of the bed elevated at least 3 inches with blocks or a wedge pillow if you are having any nighttime symptoms Stay upright for 2 hours after eating Avoid meals and snacks three to four hours before bedtime  _______________________________________________________  If your blood pressure at your visit was 140/90 or greater, please contact your primary care physician to follow up on this.  _______________________________________________________  If you are age 10 or older, your body mass index should be between 23-30. Your Body mass index is 18.87 kg/m. If this is out of the aforementioned range  listed, please consider follow up with your Primary Care Provider.  If you are age 35 or younger, your body mass index should be between 19-25. Your Body mass index is 18.87 kg/m. If this is out of the aformentioned range listed, please consider follow up with your Primary Care Provider.   ________________________________________________________  The [facility] providers would like to encourage you to use [facility] to communicate with providers for non-urgent requests or questions.  Due to long hold times on the telephone, sending your provider a message by [facility] may be a faster and more efficient way to get a response.  Please allow 48 business hours for a response.  Please remember that this is for non-urgent requests.  _______________________________________________________ It was a pleasure to see you today!  Thank you for trusting me with your gastrointestinal care!

## 2023-05-23 ENCOUNTER — Inpatient Hospital Stay: Payer: Medicare Other

## 2023-05-23 VITALS — BP 132/63 | HR 73 | Temp 98.1°F | Resp 18

## 2023-05-23 DIAGNOSIS — D508 Other iron deficiency anemias: Secondary | ICD-10-CM

## 2023-05-23 DIAGNOSIS — D500 Iron deficiency anemia secondary to blood loss (chronic): Secondary | ICD-10-CM

## 2023-05-23 MED ORDER — LORATADINE 10 MG PO TABS
10.0000 mg | ORAL_TABLET | Freq: Once | ORAL | Status: AC
  Administered 2023-05-23: 10 mg via ORAL
  Filled 2023-05-23: qty 1

## 2023-05-23 MED ORDER — ACETAMINOPHEN 325 MG PO TABS
650.0000 mg | ORAL_TABLET | Freq: Once | ORAL | Status: AC
  Administered 2023-05-23: 650 mg via ORAL
  Filled 2023-05-23: qty 2

## 2023-05-23 MED ORDER — SODIUM CHLORIDE 0.9 % IV SOLN: INTRAVENOUS | Status: DC

## 2023-05-23 MED ORDER — SODIUM CHLORIDE 0.9 % IV SOLN
510.0000 mg | Freq: Once | INTRAVENOUS | Status: AC
  Administered 2023-05-23: 510 mg via INTRAVENOUS
  Filled 2023-05-23: qty 510

## 2023-06-01 ENCOUNTER — Other Ambulatory Visit: Payer: Self-pay

## 2023-06-01 ENCOUNTER — Inpatient Hospital Stay: Payer: Medicare Other | Attending: Oncology

## 2023-06-01 VITALS — BP 116/66 | HR 67 | Temp 98.3°F | Resp 13

## 2023-06-01 DIAGNOSIS — Z87891 Personal history of nicotine dependence: Secondary | ICD-10-CM | POA: Insufficient documentation

## 2023-06-01 DIAGNOSIS — I700 Atherosclerosis of aorta: Secondary | ICD-10-CM | POA: Diagnosis not present

## 2023-06-01 DIAGNOSIS — K219 Gastro-esophageal reflux disease without esophagitis: Secondary | ICD-10-CM | POA: Insufficient documentation

## 2023-06-01 DIAGNOSIS — Z7951 Long term (current) use of inhaled steroids: Secondary | ICD-10-CM | POA: Diagnosis not present

## 2023-06-01 DIAGNOSIS — E785 Hyperlipidemia, unspecified: Secondary | ICD-10-CM | POA: Insufficient documentation

## 2023-06-01 DIAGNOSIS — J45909 Unspecified asthma, uncomplicated: Secondary | ICD-10-CM | POA: Diagnosis not present

## 2023-06-01 DIAGNOSIS — Z85118 Personal history of other malignant neoplasm of bronchus and lung: Secondary | ICD-10-CM | POA: Insufficient documentation

## 2023-06-01 DIAGNOSIS — Z801 Family history of malignant neoplasm of trachea, bronchus and lung: Secondary | ICD-10-CM | POA: Insufficient documentation

## 2023-06-01 DIAGNOSIS — D508 Other iron deficiency anemias: Secondary | ICD-10-CM

## 2023-06-01 DIAGNOSIS — Z9981 Dependence on supplemental oxygen: Secondary | ICD-10-CM | POA: Insufficient documentation

## 2023-06-01 DIAGNOSIS — K5900 Constipation, unspecified: Secondary | ICD-10-CM | POA: Diagnosis not present

## 2023-06-01 DIAGNOSIS — I2510 Atherosclerotic heart disease of native coronary artery without angina pectoris: Secondary | ICD-10-CM | POA: Insufficient documentation

## 2023-06-01 DIAGNOSIS — K921 Melena: Secondary | ICD-10-CM | POA: Diagnosis not present

## 2023-06-01 DIAGNOSIS — D649 Anemia, unspecified: Secondary | ICD-10-CM | POA: Diagnosis not present

## 2023-06-01 DIAGNOSIS — I252 Old myocardial infarction: Secondary | ICD-10-CM | POA: Diagnosis not present

## 2023-06-01 DIAGNOSIS — I10 Essential (primary) hypertension: Secondary | ICD-10-CM | POA: Insufficient documentation

## 2023-06-01 DIAGNOSIS — Z79899 Other long term (current) drug therapy: Secondary | ICD-10-CM | POA: Insufficient documentation

## 2023-06-01 DIAGNOSIS — R5382 Chronic fatigue, unspecified: Secondary | ICD-10-CM | POA: Insufficient documentation

## 2023-06-01 DIAGNOSIS — Z7982 Long term (current) use of aspirin: Secondary | ICD-10-CM | POA: Diagnosis not present

## 2023-06-01 DIAGNOSIS — Z800 Family history of malignant neoplasm of digestive organs: Secondary | ICD-10-CM | POA: Diagnosis not present

## 2023-06-01 DIAGNOSIS — D500 Iron deficiency anemia secondary to blood loss (chronic): Secondary | ICD-10-CM | POA: Insufficient documentation

## 2023-06-01 DIAGNOSIS — J449 Chronic obstructive pulmonary disease, unspecified: Secondary | ICD-10-CM | POA: Diagnosis not present

## 2023-06-01 DIAGNOSIS — J4489 Other specified chronic obstructive pulmonary disease: Secondary | ICD-10-CM | POA: Insufficient documentation

## 2023-06-01 MED ORDER — SODIUM CHLORIDE 0.9 % IV SOLN
510.0000 mg | Freq: Once | INTRAVENOUS | Status: AC
  Administered 2023-06-01: 510 mg via INTRAVENOUS
  Filled 2023-06-01: qty 17

## 2023-06-01 MED ORDER — LORATADINE 10 MG PO TABS
10.0000 mg | ORAL_TABLET | Freq: Once | ORAL | Status: AC
  Administered 2023-06-01: 10 mg via ORAL
  Filled 2023-06-01: qty 1

## 2023-06-01 MED ORDER — ACETAMINOPHEN 325 MG PO TABS
650.0000 mg | ORAL_TABLET | Freq: Once | ORAL | Status: AC
  Administered 2023-06-01: 650 mg via ORAL
  Filled 2023-06-01: qty 2

## 2023-06-01 MED ORDER — SODIUM CHLORIDE 0.9 % IV SOLN: INTRAVENOUS | Status: DC

## 2023-06-01 NOTE — Patient Instructions (Signed)
Ferumoxytol Injection What is this medication? FERUMOXYTOL (FER ue MOX i tol) treats low levels of iron in your body (iron deficiency anemia). Iron is a mineral that plays an important role in making red blood cells, which carry oxygen from your lungs to the rest of your body. This medicine may be used for other purposes; ask your health care provider or pharmacist if you have questions. COMMON BRAND NAME(S): Feraheme What should I tell my care team before I take this medication? They need to know if you have any of these conditions: Anemia not caused by low iron levels High levels of iron in the blood Magnetic resonance imaging (MRI) test scheduled An unusual or allergic reaction to iron, other medications, foods, dyes, or preservatives Pregnant or trying to get pregnant Breastfeeding How should I use this medication? This medication is injected into a vein. It is given by your care team in a hospital or clinic setting. Talk to your care team the use of this medication in children. Special care may be needed. Overdosage: If you think you have taken too much of this medicine contact a poison control center or emergency room at once. NOTE: This medicine is only for you. Do not share this medicine with others. What if I miss a dose? It is important not to miss your dose. Call your care team if you are unable to keep an appointment. What may interact with this medication? Other iron products This list may not describe all possible interactions. Give your health care provider a list of all the medicines, herbs, non-prescription drugs, or dietary supplements you use. Also tell them if you smoke, drink alcohol, or use illegal drugs. Some items may interact with your medicine. What should I watch for while using this medication? Visit your care team regularly. Tell your care team if your symptoms do not start to get better or if they get worse. You may need blood work done while you are taking this  medication. You may need to follow a special diet. Talk to your care team. Foods that contain iron include: whole grains/cereals, dried fruits, beans, or peas, leafy green vegetables, and organ meats (liver, kidney). What side effects may I notice from receiving this medication? Side effects that you should report to your care team as soon as possible: Allergic reactions--skin rash, itching, hives, swelling of the face, lips, tongue, or throat Low blood pressure--dizziness, feeling faint or lightheaded, blurry vision Shortness of breath Side effects that usually do not require medical attention (report to your care team if they continue or are bothersome): Flushing Headache Joint pain Muscle pain Nausea Pain, redness, or irritation at injection site This list may not describe all possible side effects. Call your doctor for medical advice about side effects. You may report side effects to FDA at 1-800-FDA-1088. Where should I keep my medication? This medication is given in a hospital or clinic. It will not be stored at home. NOTE: This sheet is a summary. It may not cover all possible information. If you have questions about this medicine, talk to your doctor, pharmacist, or health care provider.  2024 [employer] Standard (2023-03-23 00:00:00)

## 2023-06-07 ENCOUNTER — Other Ambulatory Visit: Payer: Self-pay | Admitting: Oncology

## 2023-06-07 DIAGNOSIS — D508 Other iron deficiency anemias: Secondary | ICD-10-CM

## 2023-06-07 NOTE — Progress Notes (Signed)
[facility] Cancer Follow up Visit:  Patient Care Team: Kaleen Mask, MD as PCP - General (Family Medicine) Nahser, Deloris Ping, MD as PCP - Cardiology (Cardiology) Larence Penning, Erven Colla, MD as Referring Physician (Pulmonary Disease) Dayna Ramus, MD as Referring Physician (Otolaryngology) Center[facility] as Referring Physician (General Practice) [facility] as Referring Physician (General Practice) Loni Muse, MD as Attending Physician (Hematology)  CHIEF COMPLAINTS/PURPOSE OF CONSULTATION:  HISTORY OF PRESENTING ILLNESS: William Lee 77 y.o. male is here because of anemia Medical history notable for coronary artery disease, history of MI, COPD, papilloma of larynx, GERD, carpal tunnel syndrome History of lung cancer.  EGD 2014   October 30 2019:  CT virtual colonoscopy No fixed non barium tagged polypoid filling defects or annular constricting lesions. Diffusely calcified visualized right coronary artery. Aortic atherosclerosis.  Small low-density lesions in the liver and right kidney. There is also a hyperdense lesion within the right kidney. These likely reflects cysts although these cannot be fully characterized on this noncontrast study. These could be further evaluated with ultrasound or contrast-enhanced CT as clinically indicated.  Mar 15 2021 PET scan  Redemonstrated mixed cystic and solid lesion in the left upper lobe.  Solid component has FDG avidity slightly above liver and is concerning for a primary malignancy.  No evidence of FDG avid metastatic disease.  Focus of FDG avidity in the ascending colon. Correlate with colonoscopy for further evaluation of possible neoplasm.  June 29, 2022: WBC 5.7 hemoglobin 11.1 MCV 90 platelet count 288; 72 segs 10 lymphs 9 monos 8 eos 1 basophil.  Reticulocyte count 1.7.  Iron saturation 10%  August 11 2022:  [facility]  Lung cancer was treated at [facility] in  August 2022 with SBRT.  He believes that he has been anemic for quite some time Patient is on supplemental O2 at 2 lit [place] which he uses when he is walking or sleeping.   Taking oral iron three times daily.  On ASA 81 mg daily.  Has never had PRBC's or IV iron.  Takes ibuprofen fairly often  Social:  Married.  Worked on a [employer].  Did spray painting.  Smoked up to 3 ppd of cigarettes until 1992 when noted increased SOB.  EtOH rare in the past  [facility] Mother died 28 liver cancer, occular melanoma Father died 64 cancer Brother died 77 cancer Sister died 54 lung cancer  WBC 6.9 hemoglobin 10.8 MCV 92 platelet count 295; 76 segs 9 lymphs 6 monos 6 eos 1 basophil Reticulocytes 2.0% Direct [place] test negative haptoglobin 232 SPEP with IEP showed no paraprotein serum free kappa 52 lambda 25.3 with a kappa lambda 2.06 IgG 900 IgA 165 IgM 42 CMP notable for creatinine 1.24 glucose of 107 estimated GFR 60 B12 367 folate 14.1 ferritin 11 Copper 123 zinc 73  August 21 2022:  Feraheme 510 mg IV August 28 2022:  Feraheme 510 mg IV  September 01 2022:  Scheduled follow up for anemia.  Remains fatigued despite receiving IV iron but it may be a bit early to assess response Still taking oral iron but not as frequently now once or twice daily instead of 3 times daily.  States that there is concern about safety about intubating him because of his upper airway and pulmonary status.  Claims history of ulcer noted on EGD done about 20 yrs ago  WBC 7.4 hemoglobin 11.8 platelet count 254.  Ferritin 446  November 03 2022:    Gets fatigued at times.  Finds that breathing during the cold weather is hard.  Not taking oral iron; stopped them after receiving IV iron.  Had considerable constipation with oral iron.   Labs notable for hemoglobin 11.9 MCV 93 ferritin 45  December 15 2022:  CT chest without contrast-- Stable stereotactic body radiation treatment changes left upper lobe without  evidence of local recurrence.  Negative for intrathoracic metastasis   Stable basilar predominant bronchial wall thickening, minimal bronchiectasis, and (mild) scattered tree-in-bud compatible with mild chronic infection and/or mild chronic aspiration.   February 02 2023:   . Fatigued.  DOE on walking in the house.  Using his O2 more.  Arranged for IV iron and a unit of PRBC's.   Ferritin 5  Hgb 8.1  February 06 2023:  Feraheme 510 mg February 16 2023:  Feraheme 510 mg  Mar 02 2023:    Reviewed results of imaging with patient and wife.  Recommend GI consult for evaluation of IDA which will likely not consist of invasive procedures due to underlying lung disease.    Hgb 11.5   May 10 2023- ENT evaluation for hoarseness.  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  Stool noted to be heme positive on rectal exam by PCP   GI awaiting input from pulmonary regarding safety of endoscopy.  No weight change.  No melena.  Occasional hematochezia.  No change in bowel habits.  Hgb 12.3 MCV 96  June 15 2023:  Follow up chest CT June 22 2023:  PFT's and Pulmonary follow up   August 28 2023:  Endoscopy  Review of Systems  Constitutional:  Positive for fatigue. Negative for appetite change, chills, fever and unexpected weight change.       Has been steadily loosing weight  HENT:   Positive for trouble swallowing. Negative for mouth sores, nosebleeds, sore throat and voice change.        Sometimes has trouble swallowing solids  Eyes:  Negative for eye problems and icterus.       Vision changes:  None  Respiratory:  Positive for cough, shortness of breath and wheezing. Negative for chest tightness and hemoptysis.        Usually has dry cough  Cardiovascular:  Negative for chest pain, leg swelling and palpitations.       PND:  none Orthopnea:  none   Gastrointestinal:  Positive for constipation. Negative for abdominal pain, blood in stool, diarrhea, nausea and vomiting.       Stools dark due to oral iron Has cramping due to constipation  Endocrine: Negative for hot flashes.       Cold intolerance    Genitourinary:  Negative for bladder incontinence, difficulty urinating, dysuria, frequency and hematuria.        Occasional nocturia up to 3 times  Musculoskeletal:  Positive for arthralgias, back pain and myalgias. Negative for gait problem, neck pain and neck stiffness.  Skin:  Positive for itching. Negative for rash and wound.       Itching on back and legs  Neurological:  Positive for light-headedness and numbness. Negative for dizziness, extremity weakness, gait problem, headaches and speech difficulty.       Neuropathy of hands  Hematological:  Negative for adenopathy. Bruises/bleeds easily.  Psychiatric/Behavioral:  Negative for  sleep disturbance and suicidal ideas. The patient is not nervous/anxious.     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   bilateral hearing aids used   Hyperlipidemia    Hypertension    Ischemic heart disease    left Inguinal hernia 02/01/2012   Repaired 05/01/12    MI (myocardial infarction) (HCC)    Mixed hyperlipidemia 05/16/2017   Papilloma of larynx    PONV (postoperative nausea and vomiting)    Shortness of breath 11/11/2011   Oxygen @ 2L via [place] prn   SOB (shortness of breath) on exertion     SURGICAL HISTORY: Past Surgical History:  Procedure Laterality Date   CARDIAC  CATHETERIZATION     CARPAL TUNNEL RELEASE Right 06/10/2021   Procedure: Right Carpal Tunnel Release;  Surgeon: Maeola Harman, MD;  Location: [facility] OR;  Service: Neurosurgery;  Laterality: Right;  RM 21   CARPAL TUNNEL RELEASE Left 08/01/2021   Procedure: Left carpal tunnel release;  Surgeon: Maeola Harman, MD;  Location: [facility] OR;  Service: Neurosurgery;  Laterality: Left;   CHOLECYSTECTOMY  12/1995   COLONOSCOPY WITH PROPOFOL N/A 09/23/2013   Procedure: COLONOSCOPY WITH PROPOFOL;  Surgeon: Vertell Novak., MD;  Location: [facility];  Service: Endoscopy;  Laterality: N/A;   CORONARY ARTERY BYPASS GRAFT     ESOPHAGOGASTRODUODENOSCOPY (EGD) WITH PROPOFOL N/A 09/23/2013   Procedure: ESOPHAGOGASTRODUODENOSCOPY (EGD) WITH PROPOFOL;  Surgeon: Vertell Novak., MD;  Location: [facility];  Service: Endoscopy;  Laterality: N/A;   HERNIA REPAIR     INGUINAL HERNIA REPAIR  05/01/2012   Procedure: HERNIA REPAIR INGUINAL ADULT;  Surgeon: Currie Paris, MD;  Location: [facility];  Service: General;  Laterality: Left;  inguinal   papalomas virus removal  3/10 7/10 5/11 10/12   PERIPHERAL VASCULAR CATHETERIZATION N/A 04/19/2016   Procedure: Abdominal Aortogram w/Lower Extremity;  Surgeon: Larina Earthly, MD;  Location: [facility] CV [facility];  Service: Cardiovascular;  Laterality: N/A;    SOCIAL HISTORY: Social History   Socioeconomic History   Marital status: Married    Spouse name: Not on file   Number of children: Not on file   Years of education: Not on file   Highest education level: Not on file  Occupational History   Not on file  Tobacco Use   Smoking status: Former    Current packs/day: 0.00    Types: Cigarettes    Quit date: 10/30/1990    Years since quitting: 32.6   Smokeless tobacco: Never  Vaping Use   Vaping status: Never Used  Substance and Sexual Activity   Alcohol use: No   Drug use: Yes    Types: Marijuana    Comment: Last use 06/25/21   Sexual activity: Not on  file  Other Topics Concern   Not on file  Social History Narrative   Not on file   Social Determinants of Health   Financial Resource Strain: Not on file  Food Insecurity: Not on file  Transportation Needs: Not on file  Physical Activity: Not on file  Stress: Not on file  Social Connections: Not on file  Intimate Partner Violence: Not on file    FAMILY HISTORY Family History  Problem Relation Age of Onset   Liver cancer Mother    Bone cancer Father    Lung cancer Sister    Cancer Brother        unknown    ALLERGIES:  is allergic to silver and tape.  MEDICATIONS:  Current Outpatient Medications  Medication Sig Dispense Refill   albuterol (PROVENTIL HFA;VENTOLIN HFA) 108 (90 BASE) MCG/ACT inhaler Inhale 2 puffs into the lungs every 6 (six) hours as needed for wheezing.     aspirin 81 MG tablet Take 81 mg by mouth daily.     atorvastatin (LIPITOR) 20 MG tablet Take 20 mg by mouth daily.     Camphor-Menthol-Methyl Sal (SALONPAS EX) Apply 1 patch topically 2 (two) times daily as needed (pain).     Cholecalciferol 50 MCG (2000 UT) TABS Take 2,000 Units by mouth daily.     diltiazem (CARDIZEM CD) 120 MG 24 hr capsule Take 120 mg by mouth at bedtime.     Doxylamine-DM (VICKS NYQUIL COUGH) 6.25-15 MG/15ML LIQD Take 15 mLs by mouth 2 (two) times daily as needed (congestion/sleep). (Patient not taking: Reported on 05/22/2023)     Fluticasone-Salmeterol (ADVAIR) 250-50 MCG/DOSE AEPB Inhale 1 puff into the lungs 2 (two) times daily. [facility] Patient will complete Symbicort until bottle empty then will switch to wixela on 09/23/20     gabapentin (NEURONTIN) 300 MG capsule Take 300 mg by mouth 2 (two) times daily.     Ibuprofen 200 MG CAPS Take 600-800 mg by mouth every 6 (six) hours as needed (pain/headache/fever).     OXYGEN Inhale 2 L into the lungs as needed (During activity).     pantoprazole (PROTONIX) 40 MG tablet Take 1 tablet (40 mg total) by mouth daily. 90 tablet  3   polyvinyl alcohol (LIQUIFILM TEARS) 1.4 % ophthalmic solution Place 1 drop into both eyes as needed for dry eyes.     potassium chloride 20 MEQ/15ML (10%) SOLN Take 20 mEq by mouth 2 (two) times daily.      predniSONE (DELTASONE) 20 MG tablet Take 20 mg by mouth daily as needed. (Patient not taking: Reported on 05/22/2023)     SPIRIVA HANDIHALER 18 MCG inhalation capsule Place 18 mcg into inhaler and inhale daily.     valsartan (DIOVAN) 40 MG tablet TAKE 1 TABLET BY MOUTH EVERY DAY 90 tablet 3   No current facility-administered medications for this visit.    PHYSICAL EXAMINATION:  ECOG PERFORMANCE STATUS: 1 - Symptomatic but completely ambulatory   There were no vitals filed for this visit.   There were no vitals filed for this visit.    Physical Exam  As part of a telephone visit no physical exam could be conducted.   LABORATORY DATA: I have personally reviewed the data as listed:  Appointment on 05/18/2023  Component Date Value Ref Range Status   Iron 05/18/2023 47  45 - 182 ug/dL Final   TIBC 47/82/9562 314  250 - 450 ug/dL Final   Saturation Ratios 05/18/2023 15 (L)  17.9 - 39.5 % Final   UIBC 05/18/2023 267  117 - 376 ug/dL Final   Performed at [facility], [address], [place], [place] [address]   Ferritin 05/18/2023 30  24 - 336 ng/mL Final  Performed at [facility], [address], [place], [place] [address]   WBC Count 05/18/2023 6.0  4.0 - 10.5 K/uL Final   RBC 05/18/2023 3.57 (L)  4.22 - 5.81 MIL/uL Final   Hemoglobin 05/18/2023 11.3 (L)  13.0 - 17.0 g/dL Final   HCT 86/57/8469 33.5 (L)  39.0 - 52.0 % Final   MCV 05/18/2023 93.8  80.0 - 100.0 fL Final   MCH 05/18/2023 31.7  26.0 - 34.0 pg Final   MCHC 05/18/2023 33.7  30.0 - 36.0 g/dL Final   RDW 62/95/2841 14.7  11.5 - 15.5 % Final   Platelet Count 05/18/2023 262  150 - 400 K/uL Final   nRBC 05/18/2023 0.0  0.0 - 0.2 % Final   Neutrophils Relative %  05/18/2023 77  % Final   Neutro Abs 05/18/2023 4.7  1.7 - 7.7 K/uL Final   Lymphocytes Relative 05/18/2023 9  % Final   Lymphs Abs 05/18/2023 0.5 (L)  0.7 - 4.0 K/uL Final   Monocytes Relative 05/18/2023 7  % Final   Monocytes Absolute 05/18/2023 0.4  0.1 - 1.0 K/uL Final   Eosinophils Relative 05/18/2023 5  % Final   Eosinophils Absolute 05/18/2023 0.3  0.0 - 0.5 K/uL Final   Basophils Relative 05/18/2023 1  % Final   Basophils Absolute 05/18/2023 0.0  0.0 - 0.1 K/uL Final   Immature Granulocytes 05/18/2023 1  % Final   Abs Immature Granulocytes 05/18/2023 0.04  0.00 - 0.07 K/uL Final   Performed at [facility], [address], [place], [place] [address]    RADIOGRAPHIC STUDIES: I have personally reviewed the radiological images as listed and agree with the findings in the report  No results found.  ASSESSMENT/PLAN   77 y.o. male with medical history notable for coronary artery disease, history of MI, COPD, papilloma of larynx, GERD, carpal tunnel syndrome, history of lung cancer and tobacco use who is seen for management of anemia  Anemia:  Multifactorial.  Major component is iron deficiency from chronic GI blood loss.  Contributing factors are chronic illness and chronic kidney disease. He is not an ideal candidate for direct endoscopy of the upper and lower GI tracts owing to severe pulmonary disease and by description upper airway abnormalities.  He has undergone virtual colonoscopy and PET scan which have been unremarkable.  A potential option is capsule endoscopy however this carries with it the risk of obstruction that could potentially require surgical intervention which would pose a grave risk to him.  Therapeutics:   Since patient had symptomatic anemia with contributions from comorbidities of cardiopulmonary disease he received 2 doses of Feraheme 510 mg in October 2023 with improvement in hemoglobin.   November 03 2022:  Instructed patient to begin  [place] gentle iron or Flintstones multivitamin daily as tolerated to forestall the need for future doses of IV iron February 02 2023- Having symptomatic anemia.  Arranged for PRBC's and additional doses of IV iron.  Instructed patient to proceed to ED if develops worsening symptoms prior to the time he can be transfused.  In addition implored patient to call if he develops symptoms between visits so that we can intercede earlier February 06 2023- Feraheme 510 mg  February 16 2023- Feraheme 510 mg Mar 02 2023- Ferritin 137.  Hgb 11.5.  No need for PRBC's or IV iron at this time.  Recommend GI consult for evaluation of IDA which will likely not consist of invasive procedures due to underlying  lung disease.  (Frail elderly) May 23 2023:  Feraheme 510 mg June 01 2023:  Feraheme 510 mg June 08 2023- Hgb improved to 12.3.  Ferritin pending.  The finding of a heme positive stool confirms suspicion that anemia is arising from chronic GI blood loss.  Per patient and wife, GI is awaiting input from Pulmonology as there is concern that his pulmonary reserve may be insufficient to allow endoscopy to be performed safely.   If patient is unable to undergo endoscopy and/or is found to have a lesion which can not be corrected his Hgb can be supported with IV iron and PRN PRBC's.  Will plan on follow up with APP in 6 weeks to evaluate need for additional IV iron   Chronic fatigue: This persist despite improvement in his hemoglobin.  Again this is likely multifactorial with a major contribution being severe cardiopulmonary disease  COPD:  Secondary to heavy tobacco use.  Requires supplemental O2 via [place]  June 15 2023:  Follow up chest CT June 22 2023:  PFT's and Pulmonary follow up   Lung cancer:  treated at [facility] in August 2022 with SBRT  August 2024 For follow up chest CT, pulmonary follow up.   History of tobacco use:  Smoked up to 3 ppd of cigarettes until 1992 when noted increased SOB   Cancer Staging  No  matching staging information was found for the patient.    No problem-specific Assessment & Plan notes found for this encounter.   No orders of the defined types were placed in this encounter.  30  minutes was spent in patient care.  This included time spent preparing to see the patient (e.g., review of tests), obtaining and/or reviewing separately obtained history, counseling and educating the patient/family/caregiver, ordering medications, tests, documenting clinical information in the electronic or other health record, independently interpreting results and communicating results to the patient/family/caregiver as well as coordination of care.      All questions were answered. The patient knows to call the clinic with any problems, questions or concerns.  This note was electronically signed.    Loni Muse, MD  06/07/2023 11:52 AM

## 2023-06-08 ENCOUNTER — Inpatient Hospital Stay: Payer: Medicare Other | Admitting: Oncology

## 2023-06-08 ENCOUNTER — Other Ambulatory Visit: Payer: Self-pay

## 2023-06-08 ENCOUNTER — Inpatient Hospital Stay: Payer: Medicare Other

## 2023-06-08 VITALS — BP 135/64 | HR 77 | Temp 98.4°F | Resp 18 | Ht 63.0 in | Wt 106.6 lb

## 2023-06-08 DIAGNOSIS — J449 Chronic obstructive pulmonary disease, unspecified: Secondary | ICD-10-CM | POA: Diagnosis not present

## 2023-06-08 DIAGNOSIS — D508 Other iron deficiency anemias: Secondary | ICD-10-CM | POA: Diagnosis not present

## 2023-06-08 DIAGNOSIS — J9611 Chronic respiratory failure with hypoxia: Secondary | ICD-10-CM

## 2023-06-08 DIAGNOSIS — R54 Age-related physical debility: Secondary | ICD-10-CM | POA: Diagnosis not present

## 2023-06-08 DIAGNOSIS — D500 Iron deficiency anemia secondary to blood loss (chronic): Secondary | ICD-10-CM | POA: Diagnosis not present

## 2023-06-08 LAB — CBC WITH DIFFERENTIAL (CANCER CENTER ONLY)
Abs Immature Granulocytes: 0.02 10*3/uL (ref 0.00–0.07)
Basophils Absolute: 0 10*3/uL (ref 0.0–0.1)
Basophils Relative: 0 %
Eosinophils Absolute: 0.4 10*3/uL (ref 0.0–0.5)
Eosinophils Relative: 5 %
HCT: 37.3 % — ABNORMAL LOW (ref 39.0–52.0)
Hemoglobin: 12.3 g/dL — ABNORMAL LOW (ref 13.0–17.0)
Immature Granulocytes: 0 %
Lymphocytes Relative: 8 %
Lymphs Abs: 0.5 10*3/uL — ABNORMAL LOW (ref 0.7–4.0)
MCH: 31.7 pg (ref 26.0–34.0)
MCHC: 33 g/dL (ref 30.0–36.0)
MCV: 96.1 fL (ref 80.0–100.0)
Monocytes Absolute: 0.6 10*3/uL (ref 0.1–1.0)
Monocytes Relative: 8 %
Neutro Abs: 5.5 10*3/uL (ref 1.7–7.7)
Neutrophils Relative %: 79 %
Platelet Count: 279 10*3/uL (ref 150–400)
RBC: 3.88 MIL/uL — ABNORMAL LOW (ref 4.22–5.81)
RDW: 14.6 % (ref 11.5–15.5)
WBC Count: 7 10*3/uL (ref 4.0–10.5)
nRBC: 0 % (ref 0.0–0.2)

## 2023-06-08 LAB — FOLATE: Folate: 11.7 ng/mL (ref >5.9)

## 2023-06-08 LAB — CMP (CANCER CENTER ONLY)
ALT: 28 U/L (ref 0–44)
AST: 25 U/L (ref 15–41)
Albumin: 4.2 g/dL (ref 3.5–5.0)
Alkaline Phosphatase: 86 U/L (ref 38–126)
Anion gap: 6 (ref 5–15)
BUN: 25 mg/dL — ABNORMAL HIGH (ref 8–23)
CO2: 27 mmol/L (ref 22–32)
Calcium: 9.5 mg/dL (ref 8.9–10.3)
Chloride: 106 mmol/L (ref 98–111)
Creatinine: 1.19 mg/dL (ref 0.61–1.24)
GFR, Estimated: >60 mL/min (ref >60)
Glucose, Bld: 90 mg/dL (ref 70–99)
Potassium: 4.5 mmol/L (ref 3.5–5.1)
Sodium: 139 mmol/L (ref 135–145)
Total Bilirubin: 0.8 mg/dL (ref 0.3–1.2)
Total Protein: 7.1 g/dL (ref 6.5–8.1)

## 2023-06-08 LAB — TYPE AND SCREEN
ABO/RH(D): A POS
Antibody Screen: NEGATIVE

## 2023-06-08 LAB — FERRITIN: Ferritin: 439 ng/mL — ABNORMAL HIGH (ref 24–336)

## 2023-06-08 LAB — VITAMIN B12: Vitamin B-12: 1279 pg/mL — ABNORMAL HIGH (ref 180–914)

## 2023-06-12 ENCOUNTER — Telehealth: Payer: Self-pay

## 2023-06-12 ENCOUNTER — Other Ambulatory Visit: Payer: Self-pay

## 2023-06-12 NOTE — Progress Notes (Signed)
Results from the virtual colonoscopy in 09/2019 were reviewed and show no fixed polyps or annular constricting lesions in the area of filling defect in the mid to distal transverse colon appears to be tagged with barium and likely to be adherent stool.    PET/CT at [facility] in 02/2021 with focus of FDG avidity in the ascending colon, and more recently with FOBT+ stool and elevated CEA at 18.9, concerning for malignancy.    Reviewed most recent notes from Hematology Clinic. Due to his severe COPD he was felt to be a poor candidate for any endoscopic procedures with plan to treat anemia supportively. He is scheduled to see [facility] on 06/22/2023 for evaluation and determination of ability to tolerate sedation and any endoscopic procedures.   We discussed today at length on the phone today.  We discussed the significantly elevated risks of any endoscopic procedures and sedation due to his underlying comorbidities, including severe COPD.  We discussed that malignancy remains high on the DDx, however, if we were to find a colon (or other GI) cancer, he would certainly not be a surgical candidate based on his comorbidities, and he confirms that he would not want to pursue any abdominal surgery.  We also discussed nonmalignant potential etiologies, but at the end of the day, as it currently stands I believe his risks outweigh benefits of EGD/colonoscopy.  He would like to see what happens with his pulmonary testing later this month and get back with [place].  I think this is a very reasonable approach.  I will look to set him up for follow-up appointment with myself or Marchelle Folks in mid to late September to allow for time for workup with the Pulmonary Clinic.  All questions answered and he was very appreciative of the phone call and candid conversation today.   Doristine Locks, DO, [facility]

## 2023-06-12 NOTE — Telephone Encounter (Signed)
Scheduled appointment with patient on 07/17/23 at 2:40 PM to be seen by Dr. Barron Alvine. Previsit appointment is cancelled. We will cancel  hospital procedure, and f/u visit with Marchelle Folks on 11/12 if not needed.

## 2023-06-12 NOTE — Telephone Encounter (Signed)
-----   Message from Shellia Cleverly sent at 06/12/2023  4:23 PM EDT ----- This patient is scheduled for EGD/colonoscopy with me in late October for evaluation of abnormal PET, elevated CEA, and anemia with heme positive stool.  While malignancy is certainly quite high on the DDx, I think he is at significant risk for EGD/colonoscopy.  I discussed with him at length by phone today.  He agrees that the risks are very high.  He is actually scheduled for evaluation in the [facility] next week with pulmonary testing.  We can see what that shows and determine whether or not proceeding with EGD/colonoscopy at [facility] in late October is the right move.  Can we tentatively schedule him for an appointment with myself or Marchelle Folks in mid to late September to allow time for the pulmonary workup and so we can review all of those notes and recommendations.  Of note, he does indicate (appropriately in my opinion) that if we were to find a cancer that he would not want to pursue anything surgically due to his elevated risks.

## 2023-06-20 ENCOUNTER — Telehealth: Payer: Self-pay | Admitting: Oncology

## 2023-06-20 NOTE — Telephone Encounter (Signed)
Contacted pt to schedule an appt. Unable to reach via phone, voicemail was left.   Message Received: Isaiah Blakes, CMA sent to [facility] Scheduling Patient wants to scheduled his follow up in [place]. He was being seen in [place].

## 2023-06-22 ENCOUNTER — Other Ambulatory Visit: Payer: Medicare Other

## 2023-06-22 ENCOUNTER — Ambulatory Visit: Payer: Medicare Other | Admitting: Oncology

## 2023-07-09 ENCOUNTER — Encounter (HOSPITAL_COMMUNITY): Payer: Self-pay

## 2023-07-09 ENCOUNTER — Inpatient Hospital Stay (HOSPITAL_COMMUNITY)
Admission: EM | Admit: 2023-07-09 | Discharge: 2023-07-13 | DRG: 682 | Disposition: A | Discharge status: Hospice | Payer: Medicare Other | Attending: Internal Medicine | Admitting: Internal Medicine

## 2023-07-09 ENCOUNTER — Emergency Department (HOSPITAL_COMMUNITY): Payer: Medicare Other

## 2023-07-09 ENCOUNTER — Other Ambulatory Visit: Payer: Self-pay

## 2023-07-09 DIAGNOSIS — E782 Mixed hyperlipidemia: Secondary | ICD-10-CM | POA: Diagnosis present

## 2023-07-09 DIAGNOSIS — Z7951 Long term (current) use of inhaled steroids: Secondary | ICD-10-CM

## 2023-07-09 DIAGNOSIS — I255 Ischemic cardiomyopathy: Secondary | ICD-10-CM | POA: Diagnosis present

## 2023-07-09 DIAGNOSIS — I70229 Atherosclerosis of native arteries of extremities with rest pain, unspecified extremity: Secondary | ICD-10-CM | POA: Diagnosis not present

## 2023-07-09 DIAGNOSIS — Z951 Presence of aortocoronary bypass graft: Secondary | ICD-10-CM | POA: Diagnosis not present

## 2023-07-09 DIAGNOSIS — E43 Unspecified severe protein-calorie malnutrition: Secondary | ICD-10-CM | POA: Diagnosis present

## 2023-07-09 DIAGNOSIS — N179 Acute kidney failure, unspecified: Principal | ICD-10-CM | POA: Diagnosis present

## 2023-07-09 DIAGNOSIS — R1084 Generalized abdominal pain: Secondary | ICD-10-CM | POA: Diagnosis not present

## 2023-07-09 DIAGNOSIS — Z800 Family history of malignant neoplasm of digestive organs: Secondary | ICD-10-CM

## 2023-07-09 DIAGNOSIS — Z79899 Other long term (current) drug therapy: Secondary | ICD-10-CM

## 2023-07-09 DIAGNOSIS — Z7952 Long term (current) use of systemic steroids: Secondary | ICD-10-CM

## 2023-07-09 DIAGNOSIS — I739 Peripheral vascular disease, unspecified: Secondary | ICD-10-CM | POA: Diagnosis present

## 2023-07-09 DIAGNOSIS — K219 Gastro-esophageal reflux disease without esophagitis: Secondary | ICD-10-CM | POA: Diagnosis present

## 2023-07-09 DIAGNOSIS — Z923 Personal history of irradiation: Secondary | ICD-10-CM

## 2023-07-09 DIAGNOSIS — H9193 Unspecified hearing loss, bilateral: Secondary | ICD-10-CM | POA: Diagnosis present

## 2023-07-09 DIAGNOSIS — R197 Diarrhea, unspecified: Secondary | ICD-10-CM

## 2023-07-09 DIAGNOSIS — Z87891 Personal history of nicotine dependence: Secondary | ICD-10-CM

## 2023-07-09 DIAGNOSIS — Z681 Body mass index (BMI) 19 or less, adult: Secondary | ICD-10-CM | POA: Diagnosis not present

## 2023-07-09 DIAGNOSIS — E872 Acidosis, unspecified: Secondary | ICD-10-CM | POA: Diagnosis present

## 2023-07-09 DIAGNOSIS — Z66 Do not resuscitate: Secondary | ICD-10-CM | POA: Diagnosis present

## 2023-07-09 DIAGNOSIS — I10 Essential (primary) hypertension: Secondary | ICD-10-CM | POA: Diagnosis present

## 2023-07-09 DIAGNOSIS — J9611 Chronic respiratory failure with hypoxia: Secondary | ICD-10-CM | POA: Diagnosis present

## 2023-07-09 DIAGNOSIS — D649 Anemia, unspecified: Secondary | ICD-10-CM | POA: Diagnosis not present

## 2023-07-09 DIAGNOSIS — Z9049 Acquired absence of other specified parts of digestive tract: Secondary | ICD-10-CM

## 2023-07-09 DIAGNOSIS — Z7982 Long term (current) use of aspirin: Secondary | ICD-10-CM | POA: Diagnosis not present

## 2023-07-09 DIAGNOSIS — Z801 Family history of malignant neoplasm of trachea, bronchus and lung: Secondary | ICD-10-CM

## 2023-07-09 DIAGNOSIS — R339 Retention of urine, unspecified: Secondary | ICD-10-CM | POA: Diagnosis present

## 2023-07-09 DIAGNOSIS — Z9981 Dependence on supplemental oxygen: Secondary | ICD-10-CM | POA: Diagnosis not present

## 2023-07-09 DIAGNOSIS — E860 Dehydration: Secondary | ICD-10-CM | POA: Diagnosis present

## 2023-07-09 DIAGNOSIS — I252 Old myocardial infarction: Secondary | ICD-10-CM | POA: Diagnosis not present

## 2023-07-09 DIAGNOSIS — Z515 Encounter for palliative care: Secondary | ICD-10-CM | POA: Diagnosis not present

## 2023-07-09 DIAGNOSIS — R933 Abnormal findings on diagnostic imaging of other parts of digestive tract: Secondary | ICD-10-CM | POA: Diagnosis not present

## 2023-07-09 DIAGNOSIS — A0472 Enterocolitis due to Clostridium difficile, not specified as recurrent: Secondary | ICD-10-CM | POA: Diagnosis present

## 2023-07-09 DIAGNOSIS — E871 Hypo-osmolality and hyponatremia: Secondary | ICD-10-CM | POA: Diagnosis present

## 2023-07-09 DIAGNOSIS — K6389 Other specified diseases of intestine: Secondary | ICD-10-CM | POA: Diagnosis not present

## 2023-07-09 DIAGNOSIS — Z85118 Personal history of other malignant neoplasm of bronchus and lung: Secondary | ICD-10-CM

## 2023-07-09 DIAGNOSIS — I2510 Atherosclerotic heart disease of native coronary artery without angina pectoris: Secondary | ICD-10-CM | POA: Diagnosis present

## 2023-07-09 DIAGNOSIS — I428 Other cardiomyopathies: Secondary | ICD-10-CM | POA: Diagnosis not present

## 2023-07-09 DIAGNOSIS — D141 Benign neoplasm of larynx: Secondary | ICD-10-CM | POA: Diagnosis present

## 2023-07-09 DIAGNOSIS — J441 Chronic obstructive pulmonary disease with (acute) exacerbation: Secondary | ICD-10-CM | POA: Diagnosis present

## 2023-07-09 DIAGNOSIS — R32 Unspecified urinary incontinence: Secondary | ICD-10-CM | POA: Diagnosis present

## 2023-07-09 DIAGNOSIS — M7989 Other specified soft tissue disorders: Secondary | ICD-10-CM | POA: Diagnosis not present

## 2023-07-09 LAB — COMPREHENSIVE METABOLIC PANEL
ALT: 13 U/L (ref 0–44)
AST: 14 U/L — ABNORMAL LOW (ref 15–41)
Albumin: 2.5 g/dL — ABNORMAL LOW (ref 3.5–5.0)
Alkaline Phosphatase: 55 U/L (ref 38–126)
Anion gap: 12 (ref 5–15)
BUN: 92 mg/dL — ABNORMAL HIGH (ref 8–23)
CO2: 14 mmol/L — ABNORMAL LOW (ref 22–32)
Calcium: 6.6 mg/dL — ABNORMAL LOW (ref 8.9–10.3)
Chloride: 102 mmol/L (ref 98–111)
Creatinine, Ser: 7.7 mg/dL — ABNORMAL HIGH (ref 0.61–1.24)
GFR, Estimated: 7 mL/min — ABNORMAL LOW (ref >60)
Glucose, Bld: 101 mg/dL — ABNORMAL HIGH (ref 70–99)
Potassium: 4.9 mmol/L (ref 3.5–5.1)
Sodium: 128 mmol/L — ABNORMAL LOW (ref 135–145)
Total Bilirubin: 0.8 mg/dL (ref 0.3–1.2)
Total Protein: 5.2 g/dL — ABNORMAL LOW (ref 6.5–8.1)

## 2023-07-09 LAB — LIPASE, BLOOD: Lipase: 17 U/L (ref 11–51)

## 2023-07-09 LAB — CBC WITH DIFFERENTIAL/PLATELET
Abs Immature Granulocytes: 0.05 10*3/uL (ref 0.00–0.07)
Basophils Absolute: 0 10*3/uL (ref 0.0–0.1)
Basophils Relative: 0 %
Eosinophils Absolute: 0 10*3/uL (ref 0.0–0.5)
Eosinophils Relative: 0 %
HCT: 33.3 % — ABNORMAL LOW (ref 39.0–52.0)
Hemoglobin: 10.7 g/dL — ABNORMAL LOW (ref 13.0–17.0)
Immature Granulocytes: 1 %
Lymphocytes Relative: 3 %
Lymphs Abs: 0.2 10*3/uL — ABNORMAL LOW (ref 0.7–4.0)
MCH: 30.7 pg (ref 26.0–34.0)
MCHC: 32.1 g/dL (ref 30.0–36.0)
MCV: 95.4 fL (ref 80.0–100.0)
Monocytes Absolute: 1 10*3/uL (ref 0.1–1.0)
Monocytes Relative: 12 %
Neutro Abs: 7 10*3/uL (ref 1.7–7.7)
Neutrophils Relative %: 84 %
Platelets: 228 10*3/uL (ref 150–400)
RBC: 3.49 MIL/uL — ABNORMAL LOW (ref 4.22–5.81)
RDW: 14.6 % (ref 11.5–15.5)
WBC: 8.2 10*3/uL (ref 4.0–10.5)
nRBC: 0 % (ref 0.0–0.2)

## 2023-07-09 LAB — URINALYSIS, ROUTINE W REFLEX MICROSCOPIC
Bilirubin Urine: NEGATIVE
Glucose, UA: NEGATIVE mg/dL
Ketones, ur: NEGATIVE mg/dL
Leukocytes,Ua: NEGATIVE
Nitrite: NEGATIVE
Protein, ur: 30 mg/dL — AB
Specific Gravity, Urine: 1.027 (ref 1.005–1.030)
pH: 5 (ref 5.0–8.0)

## 2023-07-09 LAB — NA AND K (SODIUM & POTASSIUM), RAND UR
Potassium Urine: 59 mmol/L
Sodium, Ur: 32 mmol/L

## 2023-07-09 MED ORDER — DIPHENOXYLATE-ATROPINE 2.5-0.025 MG PO TABS: 2.0000 | ORAL_TABLET | Freq: Four times a day (QID) | ORAL | Status: DC | PRN

## 2023-07-09 MED ORDER — ONDANSETRON HCL 4 MG/2ML IJ SOLN: 4.0000 mg | Freq: Four times a day (QID) | INTRAMUSCULAR | Status: DC | PRN

## 2023-07-09 MED ORDER — IOHEXOL 350 MG/ML SOLN: 75.0000 mL | Freq: Once | INTRAVENOUS | Status: DC | PRN

## 2023-07-09 MED ORDER — LACTATED RINGERS IV BOLUS
1000.0000 mL | Freq: Once | INTRAVENOUS | Status: AC
  Administered 2023-07-09: 1000 mL via INTRAVENOUS

## 2023-07-09 MED ORDER — SODIUM CHLORIDE 0.9 % IV SOLN: Freq: Once | INTRAVENOUS | Status: AC

## 2023-07-09 MED ORDER — BISMUTH SUBSALICYLATE 262 MG PO CHEW: 524.0000 mg | CHEWABLE_TABLET | ORAL | Status: DC | PRN

## 2023-07-09 MED ORDER — ONDANSETRON HCL 4 MG/2ML IJ SOLN
4.0000 mg | Freq: Once | INTRAMUSCULAR | Status: DC
  Filled 2023-07-09: qty 2

## 2023-07-09 MED ORDER — IOHEXOL 350 MG/ML SOLN
75.0000 mL | Freq: Once | INTRAVENOUS | Status: AC | PRN
  Administered 2023-07-09: 75 mL via INTRAVENOUS

## 2023-07-09 MED ORDER — SODIUM CHLORIDE 0.9 % IV SOLN: INTRAVENOUS | Status: DC

## 2023-07-09 MED ORDER — ONDANSETRON HCL 4 MG PO TABS: 4.0000 mg | ORAL_TABLET | Freq: Four times a day (QID) | ORAL | Status: DC | PRN

## 2023-07-09 NOTE — ED Notes (Signed)
Pt wears 2L [place] at home. Pt's O2 sats are in the upper 90's, but provider at bedside requested [place] be on the pt.

## 2023-07-09 NOTE — ED Notes (Signed)
Tahjay Leeman (wife) [phone] call with updates

## 2023-07-09 NOTE — ED Notes (Signed)
ED TO INPATIENT HANDOFF REPORT  ED Nurse Name and Phone #: Dahlia Client [phone]  S Name/Age/Gender William Lee 77 y.o. male Room/Bed: 037C/037C  Code Status   Code Status: Full Code  Home/SNF/Other Home Patient oriented to: self, place, time, and situation Is this baseline? Yes   Triage Complete: Triage complete  Chief Complaint Acute renal failure (ARF) (HCC) [N17.9]  Triage Note PT BIB EMS from home with c/o abdominal pain and weakness that started Friday. Diarrhea that started on Saturday. Hx of cancer that is possibly blocking his colon.    76/40 300NS  114/46   Allergies Allergies  Allergen Reactions   Silver Rash and Other (See Comments)    Pulls skin off when removed Pulls skin off when removed   Tape Rash    Also cant use tegaderm.    Level of Care/Admitting Diagnosis ED Disposition     ED Disposition  Admit   Condition  --   Comment  Hospital Area: [facility] [[id number]]  Level of Care: Telemetry Medical [[id number]]  May admit patient to [facility] or [facility] if equivalent level of care is available:: Yes  Covid Evaluation: Asymptomatic - no recent exposure (last 10 days) testing not required  Diagnosis: Acute renal failure (ARF) [facility]) [[id number]]  Admitting Physician: Buena Irish [[id number]]  Attending Physician: Buena Irish [[id number]]  Certification:: I certify this patient will need inpatient services for at least 2 midnights  Expected Medical Readiness: 07/13/2023          B Medical/Surgery History Past Medical History:  Diagnosis Date   Asthma    Cancer (HCC)    Lung Cancer   Chest pain    Community acquired pneumonia 11/12/2011   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, severe    Coronary artery disease    Coronary artery disease involving native coronary artery of native heart without angina pectoris 05/16/2017   Dyslipidemia    GERD (gastroesophageal reflux disease)    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   bilateral hearing aids used   Hyperlipidemia    Hypertension    Ischemic heart disease    left Inguinal hernia 02/01/2012   Repaired 05/01/12    MI (myocardial infarction) (HCC)    Mixed hyperlipidemia 05/16/2017   Papilloma of larynx    PONV (postoperative nausea and vomiting)    Shortness of breath 11/11/2011   Oxygen @ 2L via [place] prn   SOB (shortness of breath) on exertion    Past Surgical History:  Procedure Laterality Date   CARDIAC CATHETERIZATION     CARPAL TUNNEL RELEASE Right 06/10/2021   Procedure: Right Carpal Tunnel Release;  Surgeon: Maeola Harman, MD;  Location: [facility] OR;  Service: Neurosurgery;  Laterality: Right;  RM 21   CARPAL TUNNEL RELEASE Left 08/01/2021   Procedure: Left carpal tunnel release;  Surgeon: Maeola Harman, MD;  Location: [facility] OR;  Service: Neurosurgery;  Laterality: Left;   CHOLECYSTECTOMY  12/1995   COLONOSCOPY WITH PROPOFOL N/A 09/23/2013   Procedure: COLONOSCOPY WITH PROPOFOL;  Surgeon: Vertell Novak., MD;  Location: [facility];  Service: Endoscopy;  Laterality: N/A;   CORONARY ARTERY BYPASS GRAFT     ESOPHAGOGASTRODUODENOSCOPY (EGD) WITH PROPOFOL N/A 09/23/2013   Procedure: ESOPHAGOGASTRODUODENOSCOPY (EGD) WITH PROPOFOL;  Surgeon: Vertell Novak., MD;  Location: [facility];  Service: Endoscopy;  Laterality: N/A;   HERNIA REPAIR     INGUINAL HERNIA REPAIR  05/01/2012   Procedure: HERNIA REPAIR  INGUINAL ADULT;  Surgeon: Currie Paris, MD;  Location: [facility];  Service: General;  Laterality: Left;  inguinal   papalomas virus removal  3/10 7/10 5/11 10/12   PERIPHERAL VASCULAR CATHETERIZATION N/A 04/19/2016   Procedure: Abdominal Aortogram w/Lower Extremity;  Surgeon: Larina Earthly, MD;  Location: [facility] INVASIVE CV LAB;  Service: Cardiovascular;  Laterality: N/A;     A IV Location/Drains/Wounds Patient Lines/Drains/Airways Status     Active Line/Drains/Airways     Name Placement date Placement time Site Days    Peripheral IV 07/09/23 20 G Right Antecubital 07/09/23  1033  Antecubital  less than 1            Intake/Output Last 24 hours  Intake/Output Summary (Last 24 hours) at 07/09/2023 1905 Last data filed at 07/09/2023 1823 Gross per 24 hour  Intake 999 ml  Output 200 ml  Net 799 ml    Labs/Imaging Results for orders placed or performed during the hospital encounter of 07/09/23 (from the past 48 hour(s))  Comprehensive metabolic panel     Status: Abnormal   Collection Time: 07/09/23  3:50 PM  Result Value Ref Range   Sodium 128 (L) 135 - 145 mmol/L   Potassium 4.9 3.5 - 5.1 mmol/L   Chloride 102 98 - 111 mmol/L   CO2 14 (L) 22 - 32 mmol/L   Glucose, Bld 101 (H) 70 - 99 mg/dL    Comment: Glucose reference range applies only to samples taken after fasting for at least 8 hours.   BUN 92 (H) 8 - 23 mg/dL   Creatinine, Ser 1.61 (H) 0.61 - 1.24 mg/dL   Calcium 6.6 (L) 8.9 - 10.3 mg/dL   Total Protein 5.2 (L) 6.5 - 8.1 g/dL   Albumin 2.5 (L) 3.5 - 5.0 g/dL   AST 14 (L) 15 - 41 U/L   ALT 13 0 - 44 U/L   Alkaline Phosphatase 55 38 - 126 U/L   Total Bilirubin 0.8 0.3 - 1.2 mg/dL   GFR, Estimated 7 (L) >60 mL/min    Comment: (NOTE) Calculated using the CKD-EPI Creatinine Equation (2021)    Anion gap 12 5 - 15    Comment: Performed at [facility], [address] [place], [place] [address]  Lipase, blood     Status: None   Collection Time: 07/09/23  3:50 PM  Result Value Ref Range   Lipase 17 11 - 51 U/L    Comment: Performed at [facility], [address] [place], [place] [address]  CBC with Diff     Status: Abnormal   Collection Time: 07/09/23  3:50 PM  Result Value Ref Range   WBC 8.2 4.0 - 10.5 K/uL   RBC 3.49 (L) 4.22 - 5.81 MIL/uL   Hemoglobin 10.7 (L) 13.0 - 17.0 g/dL   HCT 11.9 (L) 14.7 - 82.9 %   MCV 95.4 80.0 - 100.0 fL   MCH 30.7 26.0 - 34.0 pg   MCHC 32.1 30.0 - 36.0 g/dL   RDW 56.2 13.0 - 86.5 %   Platelets 228 150 - 400 K/uL   nRBC 0.0 0.0 - 0.2 %    Neutrophils Relative % 84 %   Neutro Abs 7.0 1.7 - 7.7 K/uL   Lymphocytes Relative 3 %   Lymphs Abs 0.2 (L) 0.7 - 4.0 K/uL   Monocytes Relative 12 %   Monocytes Absolute 1.0 0.1 - 1.0 K/uL   Eosinophils Relative 0 %  Eosinophils Absolute 0.0 0.0 - 0.5 K/uL   Basophils Relative 0 %   Basophils Absolute 0.0 0.0 - 0.1 K/uL   WBC Morphology VACUOLATED NEUTROPHILS    Immature Granulocytes 1 %   Abs Immature Granulocytes 0.05 0.00 - 0.07 K/uL    Comment: Performed at [facility], [address] [place], [place] [address]  Urinalysis, Routine w reflex microscopic -Urine, Clean Catch     Status: Abnormal   Collection Time: 07/09/23  6:23 PM  Result Value Ref Range   Color, Urine YELLOW YELLOW   APPearance HAZY (A) CLEAR   Specific Gravity, Urine 1.027 1.005 - 1.030   pH 5.0 5.0 - 8.0   Glucose, UA NEGATIVE NEGATIVE mg/dL   Hgb urine dipstick SMALL (A) NEGATIVE   Bilirubin Urine NEGATIVE NEGATIVE   Ketones, ur NEGATIVE NEGATIVE mg/dL   Protein, ur 30 (A) NEGATIVE mg/dL   Nitrite NEGATIVE NEGATIVE   Leukocytes,Ua NEGATIVE NEGATIVE   RBC / HPF 0-5 0 - 5 RBC/hpf   WBC, UA 0-5 0 - 5 WBC/hpf   Bacteria, UA RARE (A) NONE SEEN   Squamous Epithelial / HPF 0-5 0 - 5 /HPF   Mucus PRESENT    Hyaline Casts, UA PRESENT     Comment: Performed at [facility], [address], [place], [place] [address]   CT ABDOMEN PELVIS W CONTRAST  Result Date: 07/09/2023 CLINICAL DATA:  Abdominal pain.  Personal history of lung cancer EXAM: CT ABDOMEN AND PELVIS WITH CONTRAST TECHNIQUE: Multidetector CT imaging of the abdomen and pelvis was performed using the standard protocol following bolus administration of intravenous contrast. RADIATION DOSE REDUCTION: This exam was performed according to the departmental dose-optimization program which includes automated exposure control, adjustment of the mA and/or kV according to patient size and/or use of iterative reconstruction technique. CONTRAST:  75mL  OMNIPAQUE IOHEXOL 350 MG/ML SOLN COMPARISON:  CT of the abdomen pelvis with contrast 01/29/2019. FINDINGS: Lower chest: Centrilobular emphysematous changes are present at the lung bases. No superimposed airspace disease is present. Dense coronary artery calcifications are present. Heart size is normal. Hepatobiliary: The common bile duct measures up to 9 mm. Mild intrahepatic biliary dilation has progressed slightly. No focal lesions are present. Pancreas: Unremarkable. No pancreatic ductal dilatation or surrounding inflammatory changes. Spleen: Normal in size without focal abnormality. Adrenals/Urinary Tract: Adrenal glands are normal bilaterally. In intermediate density lesion at the upper pole of the right kidney is stable measuring 1.7 cm. A simple cyst posteriorly in the right kidney measures 1.6 cm. Two subcentimeter simple cysts are present in the pole of the left kidney. No solid lesions are present. No obstruction is present. Stomach/Bowel: Stomach and duodenum are within normal limits. Mild small bowel dilation measures up to 2.5 cm. Wall thickening is present at the terminal ileum and ileocecal valve. No definite mass lesion is present. Colon is mostly collapsed beyond the level. Diverticular changes are present in the descending colon without focal inflammation. Vascular/Lymphatic: Atherosclerotic calcifications are present the aorta and branch vessels without aneurysm. Dense calcifications result in significant stenosis in the internal iliac arteries bilaterally, right greater than left. No significant adenopathy is present. Reproductive: Prostate is unremarkable. Other: Fat extends into the left inguinal canal. No associated bowel is present. No other significant ventral hernia is present. No free fluid or free air is present. Musculoskeletal: Bilateral pars defects are again noted at L5. Facet degenerative changes are present at L3-4 and L4-5. No focal osseous lesions are present. The  bony pelvis is  normal. The hips are located and within normal limits. IMPRESSION: 1. Mild small bowel dilation measures up to 2.5 cm. Wall thickening at the terminal ileum and ileocecal valve. While the finding may be related to inflammatory disease, a mass lesion is not excluded. Colonoscopy may be useful for further evaluation. 2. Descending colonic diverticulosis without diverticulitis. 3. Fat extends into the left inguinal canal. No associated bowel is present. 4. Stable intermediate density lesion at the upper pole of the right kidney measuring 1.7 cm. This likely represents a hemorrhagic or proteinaceous cyst. Recommend no imaging follow-up. 5. 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 6. Coronary artery disease. 7. Dense calcifications result in significant stenosis in the internal iliac arteries bilaterally, right greater than left. 8. Aortic Atherosclerosis (ICD10-I70.0) and Emphysema (ICD10-J43.9). Electronically Signed   By: Marin Roberts M.D.   On: 07/09/2023 13:36    Pending Labs Unresulted Labs (From admission, onward)     Start     Ordered   07/10/23 0500  TSH  Tomorrow morning,   R        07/09/23 1858   07/10/23 0500  Basic metabolic panel  Tomorrow morning,   R        07/09/23 1858   07/10/23 0500  CBC  Tomorrow morning,   R        07/09/23 1858   07/09/23 1810  Na and K (sodium & potassium), rand urine  Once,   URGENT        07/09/23 1809            Vitals/Pain Today's Vitals   07/09/23 1715 07/09/23 1730 07/09/23 1830 07/09/23 1854  BP: (!) 103/50 (!) 111/51 (!) 102/50   Pulse: 98 97 98   Resp: 20 17    Temp:      TempSrc:      SpO2: 94% 93% 97%   Weight:    106 lb 11.2 oz (48.4 kg)  Height:    5\' 3"  (1.6 m)  PainSc:        Isolation Precautions No active isolations  Medications Medications  0.9 %  sodium chloride infusion (0 mLs Intravenous Stopped 07/09/23 1407)  ondansetron (ZOFRAN) injection 4 mg (4 mg Intravenous Patient Refused/Not Given  07/09/23 1111)  iohexol (OMNIPAQUE) 350 MG/ML injection 75 mL (has no administration in time range)  lactated ringers bolus 1,000 mL (has no administration in time range)  ondansetron (ZOFRAN) tablet 4 mg (has no administration in time range)    Or  ondansetron (ZOFRAN) injection 4 mg (has no administration in time range)  iohexol (OMNIPAQUE) 350 MG/ML injection 75 mL (75 mLs Intravenous Contrast Given 07/09/23 1143)  0.9 %  sodium chloride infusion ( Intravenous New Bag/Given 07/09/23 1852)    Mobility walks     Focused Assessments     R Recommendations: See Admitting Provider Note  Report given to:   Additional Notes:

## 2023-07-09 NOTE — ED Triage Notes (Signed)
PT BIB EMS from home with c/o abdominal pain and weakness that started Friday. Diarrhea that started on Saturday. Hx of cancer that is possibly blocking his colon.    76/40 300NS  114/46

## 2023-07-09 NOTE — ED Provider Notes (Signed)
4:21 PM Patient signed out to me by previous ED physician. Pt is a 77 yo male presenting for abdominal pain.  CT demonstrates left sided colonic mass.   Pending: Labs Plan: DC if stable with GI f/u.   Physical Exam  BP (!) 105/49   Pulse (!) 104   Temp 99.9 F (37.7 C) (Oral)   Resp 20   SpO2 95%   Physical Exam  Procedures  .Critical Care  Performed by: Franne Forts, DO Authorized by: Franne Forts, DO   Critical care provider statement:    Critical care time (minutes):  30   Critical care was necessary to treat or prevent imminent or life-threatening deterioration of the following conditions:  Renal failure   Critical care was time spent personally by me on the following activities:  Development of treatment plan with patient or surrogate, discussions with consultants, evaluation of patient's response to treatment, examination of patient, ordering and review of laboratory studies, ordering and review of radiographic studies, ordering and performing treatments and interventions, pulse oximetry, re-evaluation of patient's condition and review of old charts   Care discussed with: admitting provider     Care discussed with comment:  Nephro   ED Course / MDM    Medical Decision Making Amount and/or Complexity of Data Reviewed Labs: ordered. Radiology: ordered.  Risk Prescription drug management. Decision regarding hospitalization.   Patient's laboratory studies concerning for hypocalcemia, hyponatremia, dehydration, anemia, and new AKI.  Creatinine was within normal limits on previous blood draw 1 month ago and is now 7.7.  BUN 92. Possibly secondary to dehydration from significant diarrhea.  Patient already fluid resuscitated.Urine lites ordered.  Recommending admission at this time.  UA ordered.  Patient accepted by admitting team. Nephro consulted.       Franne Forts, DO 07/09/23 [phone]

## 2023-07-09 NOTE — Discharge Instructions (Addendum)
Be sure to follow-up with your gastroenterologist.  Return here for concerning changes in your condition.

## 2023-07-09 NOTE — H&P (Addendum)
History and Physical    Patient: William Lee RUE:[id number] DOB: [birth year] DOA: 07/09/2023 DOS: the patient was seen and examined on 07/09/2023 PCP: Kaleen Mask, MD  Patient coming from: Home  Chief Complaint:  Chief Complaint  Patient presents with   Abdominal Pain   HPI: William Lee is a 77 y.o. male with medical history significant for lung cancer in 2022, now on 2 L O2 nasal cannula at all times, papilloma of the larynx, COPD, ischemic cardiomyopathy, EF equal 45% in 2019, chronic anemia and guaiac positive stools, colonic mass recently diagnosed no further workup is currently planned because of the patient's lung disease.  He has not felt to be a surgical candidate. The patient reports that he was in his usual state of health until 3 days ago when he developed diarrhea.  The diarrhea has been severe he says it is like he had a prep for colonoscopy.  He denies any fever.  No one else in the house is sick.  But he has had this profuse diarrhea that has been unstoppable for the last 3 days.  He has tried Imodium twice he thinks that might of slowed it down a little but diarrhea continues. His family finally had him come in for evaluation today.  In the emergency department the patient had a CT scan of his abdomen pelvis which was fairly unremarkable.  His lab results however revealed a creatinine of 7.7, potassium is 4.9 and sodium is 128. Creatinine was 1.1 one month ago The patient also reports he has not urinated in 3 days until urinating here in the emergency department after his fluid bolus.  He will be admitted for IV hydration. He is having diffuse mild abdominal pain but no vomiting.  Review of Systems: As mentioned in the history of present illness. All other systems reviewed and are negative. 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   bilateral hearing aids used   Hyperlipidemia    Hypertension    Ischemic heart disease    left Inguinal hernia 02/01/2012   Repaired 05/01/12    MI (myocardial infarction) (HCC)    Mixed hyperlipidemia 05/16/2017   Papilloma of larynx    PONV (postoperative nausea and vomiting)    Shortness of breath 11/11/2011   Oxygen @ 2L via [place] prn   SOB (shortness of breath) on exertion    Past Surgical History:  Procedure Laterality Date   CARDIAC CATHETERIZATION     CARPAL TUNNEL RELEASE Right 06/10/2021   Procedure: Right Carpal Tunnel Release;  Surgeon: Maeola Harman, MD;  Location: [facility] OR;  Service: Neurosurgery;  Laterality: Right;  RM 21   CARPAL TUNNEL RELEASE Left 08/01/2021   Procedure: Left carpal tunnel release;  Surgeon: Maeola Harman, MD;  Location: [facility] OR;  Service: Neurosurgery;  Laterality: Left;   CHOLECYSTECTOMY  12/1995   COLONOSCOPY WITH PROPOFOL N/A 09/23/2013   Procedure: COLONOSCOPY WITH PROPOFOL;  Surgeon: Vertell Novak., MD;  Location: [facility]COPY;  Service: Endoscopy;  Laterality: N/A;   CORONARY  ARTERY BYPASS GRAFT     ESOPHAGOGASTRODUODENOSCOPY (EGD) WITH PROPOFOL N/A 09/23/2013   Procedure: ESOPHAGOGASTRODUODENOSCOPY (EGD) WITH PROPOFOL;  Surgeon: Vertell Novak., MD;  Location: [facility];  Service: Endoscopy;  Laterality: N/A;   HERNIA REPAIR     INGUINAL HERNIA REPAIR  05/01/2012   Procedure: HERNIA REPAIR INGUINAL ADULT;  Surgeon: Currie Paris, MD;  Location: [facility];  Service: General;  Laterality: Left;  inguinal   papalomas virus removal  3/10 7/10 5/11 10/12   PERIPHERAL VASCULAR CATHETERIZATION N/A 04/19/2016   Procedure: Abdominal Aortogram w/Lower Extremity;   Surgeon: Larina Earthly, MD;  Location: [facility] INVASIVE CV LAB;  Service: Cardiovascular;  Laterality: N/A;   Social History:  reports that he quit smoking about 32 years ago. His smoking use included cigarettes. He has never used smokeless tobacco. He reports current drug use. Drug: Marijuana. He reports that he does not drink alcohol.  Allergies  Allergen Reactions   Silver Rash and Other (See Comments)    Pulls skin off when removed Pulls skin off when removed   Tape Rash    Also cant use tegaderm.    Family History  Problem Relation Age of Onset   Liver cancer Mother    Bone cancer Father    Lung cancer Sister    Cancer Brother        unknown    Prior to Admission medications   Medication Sig Start Date End Date Taking? Authorizing Provider  albuterol (PROVENTIL HFA;VENTOLIN HFA) 108 (90 BASE) MCG/ACT inhaler Inhale 2 puffs into the lungs every 6 (six) hours as needed for wheezing.    [provider]  aspirin 81 MG tablet Take 81 mg by mouth daily.    [provider]  atorvastatin (LIPITOR) 20 MG tablet Take 20 mg by mouth daily. 06/22/15   [provider]  Camphor-Menthol-Methyl Sal (SALONPAS EX) Apply 1 patch topically 2 (two) times daily as needed (pain).    [provider]  Cholecalciferol 50 MCG (2000 UT) TABS Take 2,000 Units by mouth daily. 03/01/18   [provider]  diltiazem (CARDIZEM CD) 120 MG 24 hr capsule Take 120 mg by mouth at bedtime.    [provider]  Doxylamine-DM (VICKS NYQUIL COUGH) 6.25-15 MG/15ML LIQD Take 15 mLs by mouth 2 (two) times daily as needed (congestion/sleep).    [provider]  Fluticasone-Salmeterol (ADVAIR) 250-50 MCG/DOSE AEPB Inhale 1 puff into the lungs 2 (two) times daily. [facility] Patient will complete Symbicort until bottle empty then will switch to wixela on 09/23/20 09/23/20   [provider]  gabapentin (NEURONTIN) 300 MG capsule Take 300 mg by mouth 2  (two) times daily.    [provider]  Ibuprofen 200 MG CAPS Take 600-800 mg by mouth every 6 (six) hours as needed (pain/headache/fever).    [provider]  OXYGEN Inhale 2 L into the lungs as needed (During activity).    [provider]  pantoprazole (PROTONIX) 40 MG tablet Take 1 tablet (40 mg total) by mouth daily. 05/22/23   Doree Albee, PA-C  polyvinyl alcohol (LIQUIFILM TEARS) 1.4 % ophthalmic solution Place 1 drop into both eyes as needed for dry eyes.    [provider]  potassium chloride 20 MEQ/15ML (10%) SOLN Take 20 mEq by mouth 2 (two) times daily.     [provider]  predniSONE (DELTASONE) 20 MG tablet Take 20 mg by mouth daily as needed.  [provider]  SPIRIVA HANDIHALER 18 MCG inhalation capsule Place 18 mcg into inhaler and inhale daily. 08/30/20   [provider]  valsartan (DIOVAN) 40 MG tablet TAKE 1 TABLET BY MOUTH EVERY DAY 01/01/23   Nahser, Deloris Ping, MD    Physical Exam: Vitals:   07/09/23 1715 07/09/23 1730 07/09/23 1830 07/09/23 1854  BP: (!) 103/50 (!) 111/51 (!) 102/50   Pulse: 98 97 98   Resp: 20 17    Temp:      TempSrc:      SpO2: 94% 93% 97%   Weight:    48.4 kg  Height:    5\' 3"  (1.6 m)   Physical Exam:  General: No acute distress, well developed, dry mouth HEENT: Normocephalic, atraumatic, PERRL Cardiovascular: Normal rate and rhythm. Distal pulses intact. Pulmonary: Normal pulmonary effort, normal breath sounds Gastrointestinal: Nondistended abdomen, soft, diffuse mild tenderness, normoactive bowel sounds, no organomegaly Musculoskeletal:Normal ROM, no lower ext edema Lymphadenopathy: No cervical LAD. Skin: Skin is warm and dry. Neuro: No focal deficits noted, AAOx3. PSYCH: Attentive and cooperative  Data Reviewed:  Results for orders placed or performed during the hospital encounter of 07/09/23 (from the past 24 hour(s))  Comprehensive metabolic panel     Status:  Abnormal   Collection Time: 07/09/23  3:50 PM  Result Value Ref Range   Sodium 128 (L) 135 - 145 mmol/L   Potassium 4.9 3.5 - 5.1 mmol/L   Chloride 102 98 - 111 mmol/L   CO2 14 (L) 22 - 32 mmol/L   Glucose, Bld 101 (H) 70 - 99 mg/dL   BUN 92 (H) 8 - 23 mg/dL   Creatinine, Ser 5.78 (H) 0.61 - 1.24 mg/dL   Calcium 6.6 (L) 8.9 - 10.3 mg/dL   Total Protein 5.2 (L) 6.5 - 8.1 g/dL   Albumin 2.5 (L) 3.5 - 5.0 g/dL   AST 14 (L) 15 - 41 U/L   ALT 13 0 - 44 U/L   Alkaline Phosphatase 55 38 - 126 U/L   Total Bilirubin 0.8 0.3 - 1.2 mg/dL   GFR, Estimated 7 (L) >60 mL/min   Anion gap 12 5 - 15  Lipase, blood     Status: None   Collection Time: 07/09/23  3:50 PM  Result Value Ref Range   Lipase 17 11 - 51 U/L  CBC with Diff     Status: Abnormal   Collection Time: 07/09/23  3:50 PM  Result Value Ref Range   WBC 8.2 4.0 - 10.5 K/uL   RBC 3.49 (L) 4.22 - 5.81 MIL/uL   Hemoglobin 10.7 (L) 13.0 - 17.0 g/dL   HCT 46.9 (L) 62.9 - 52.8 %   MCV 95.4 80.0 - 100.0 fL   MCH 30.7 26.0 - 34.0 pg   MCHC 32.1 30.0 - 36.0 g/dL   RDW 41.3 24.4 - 01.0 %   Platelets 228 150 - 400 K/uL   nRBC 0.0 0.0 - 0.2 %   Neutrophils Relative % 84 %   Neutro Abs 7.0 1.7 - 7.7 K/uL   Lymphocytes Relative 3 %   Lymphs Abs 0.2 (L) 0.7 - 4.0 K/uL   Monocytes Relative 12 %   Monocytes Absolute 1.0 0.1 - 1.0 K/uL   Eosinophils Relative 0 %   Eosinophils Absolute 0.0 0.0 - 0.5 K/uL   Basophils Relative 0 %   Basophils Absolute 0.0 0.0 - 0.1 K/uL   WBC Morphology VACUOLATED NEUTROPHILS    Immature Granulocytes 1 %  Abs Immature Granulocytes 0.05 0.00 - 0.07 K/uL     Assessment and Plan: Acute renal failure, Hyponatremia due to dehydration - IVF, monitor creatinine and urine output  2. Diarrhea - It seems to be improving.  Cause unknown. Monitor. symptomatic care  3. Colonic mass - Not a surgical candidiate  5.  Chronic respiratory failure on 2 L O2 nasal cannula at all times -continue O2 and nebs.  4.  Hoarseness due to papilloma of the larynx   Advance Care Planning:   Code Status: Prior   Consults: nephrology  Family Communication: none  Severity of Illness: The appropriate patient status for this patient is INPATIENT. Inpatient status is judged to be reasonable and necessary in order to provide the required intensity of service to ensure the patient's safety. The patient's presenting symptoms, physical exam findings, and initial radiographic and laboratory data in the context of their chronic comorbidities is felt to place them at high risk for further clinical deterioration. Furthermore, it is not anticipated that the patient will be medically stable for discharge from the hospital within 2 midnights of admission.   * I certify that at the point of admission it is my clinical judgment that the patient will require inpatient hospital care spanning beyond 2 midnights from the point of admission due to high intensity of service, high risk for further deterioration and high frequency of surveillance required.*  Author: Buena Irish, MD 07/09/2023 6:56 PM  For on call review [email or web address].

## 2023-07-09 NOTE — ED Notes (Signed)
Confirmed with lab via phone that they received all labs.

## 2023-07-09 NOTE — ED Notes (Signed)
Labs had been drawn twice by previous shift nurse.  Lab was called regarding not being in process.  Labs were redrawn after lab stated that they could not be found.  Labs were walked to the lab by [facility].

## 2023-07-09 NOTE — ED Provider Notes (Signed)
[place] EMERGENCY DEPARTMENT AT [facility] Provider Note   CSN: [id number] Arrival date & time: 07/09/23  1020     History  Chief Complaint  Patient presents with   Abdominal Pain    William Lee is a 77 y.o. male.  HPI Patient presents via EMS with concern for nausea abdominal pain and diarrhea. Patient has a history of intra-abdominal malignancy, is unsure about what type, feels that was diagnosed 2 years ago. [place] patient is hypertensive en route, received fluids with some improvement in his MAP.  He complains of pain began 3 days ago, with multiple episodes of loose stool since that time.     Home Medications Prior to Admission medications   Medication Sig Start Date End Date Taking? Authorizing Provider  albuterol (PROVENTIL HFA;VENTOLIN HFA) 108 (90 BASE) MCG/ACT inhaler Inhale 2 puffs into the lungs every 6 (six) hours as needed for wheezing.    [provider]  aspirin 81 MG tablet Take 81 mg by mouth daily.    [provider]  atorvastatin (LIPITOR) 20 MG tablet Take 20 mg by mouth daily. 06/22/15   [provider]  Camphor-Menthol-Methyl Sal (SALONPAS EX) Apply 1 patch topically 2 (two) times daily as needed (pain).    [provider]  Cholecalciferol 50 MCG (2000 UT) TABS Take 2,000 Units by mouth daily. 03/01/18   [provider]  diltiazem (CARDIZEM CD) 120 MG 24 hr capsule Take 120 mg by mouth at bedtime.    [provider]  Doxylamine-DM (VICKS NYQUIL COUGH) 6.25-15 MG/15ML LIQD Take 15 mLs by mouth 2 (two) times daily as needed (congestion/sleep).    [provider]  Fluticasone-Salmeterol (ADVAIR) 250-50 MCG/DOSE AEPB Inhale 1 puff into the lungs 2 (two) times daily. [facility] Patient will complete Symbicort until bottle empty then will switch to wixela on 09/23/20 09/23/20   [provider]  gabapentin (NEURONTIN) 300 MG capsule Take 300 mg by mouth 2 (two) times  daily.    [provider]  Ibuprofen 200 MG CAPS Take 600-800 mg by mouth every 6 (six) hours as needed (pain/headache/fever).    [provider]  OXYGEN Inhale 2 L into the lungs as needed (During activity).    [provider]  pantoprazole (PROTONIX) 40 MG tablet Take 1 tablet (40 mg total) by mouth daily. 05/22/23   Doree Albee, PA-C  polyvinyl alcohol (LIQUIFILM TEARS) 1.4 % ophthalmic solution Place 1 drop into both eyes as needed for dry eyes.    [provider]  potassium chloride 20 MEQ/15ML (10%) SOLN Take 20 mEq by mouth 2 (two) times daily.     [provider]  predniSONE (DELTASONE) 20 MG tablet Take 20 mg by mouth daily as needed.    [provider]  SPIRIVA HANDIHALER 18 MCG inhalation capsule Place 18 mcg into inhaler and inhale daily. 08/30/20   [provider]  valsartan (DIOVAN) 40 MG tablet TAKE 1 TABLET BY MOUTH EVERY DAY 01/01/23   Nahser, Deloris Ping, MD      Allergies    Silver and Tape    Review of Systems   Review of Systems  All other systems reviewed and are negative.   Physical Exam Updated Vital Signs BP (!) 108/53   Pulse (!) 107   Temp 99.6 F (37.6 C) (Oral)   Resp (!) 24   SpO2 92%  Physical Exam Vitals and nursing note reviewed.  Constitutional:  Appearance: He is well-developed. He is ill-appearing.  HENT:     Head: Normocephalic and atraumatic.  Eyes:     Conjunctiva/sclera: Conjunctivae normal.  Cardiovascular:     Rate and Rhythm: Regular rhythm. Tachycardia present.  Pulmonary:     Effort: Pulmonary effort is normal. No respiratory distress.     Breath sounds: No stridor.  Abdominal:     General: Abdomen is protuberant.     Tenderness: There is abdominal tenderness. There is guarding.  Skin:    General: Skin is warm and dry.  Neurological:     Mental Status: He is alert and oriented to person, place, and time.     ED Results / Procedures / Treatments   Labs (all  labs ordered are listed, but only abnormal results are displayed) Labs Reviewed  COMPREHENSIVE METABOLIC PANEL  LIPASE, BLOOD  CBC WITH DIFFERENTIAL/PLATELET    EKG EKG Interpretation Date/Time:  Monday July 09 2023 10:37:16 EDT Ventricular Rate:  89 PR Interval:  169 QRS Duration:  116 QT Interval:  326 QTC Calculation: 397 R Axis:   77  Text Interpretation: Sinus rhythm Atrial premature complexes Nonspecific intraventricular conduction delay Borderline repolarization abnormality Baseline wander Confirmed by Gerhard Munch [phone]) on 07/09/2023 10:40:05 AM  Radiology CT ABDOMEN PELVIS W CONTRAST  Result Date: 07/09/2023 CLINICAL DATA:  Abdominal pain.  Personal history of lung cancer EXAM: CT ABDOMEN AND PELVIS WITH CONTRAST TECHNIQUE: Multidetector CT imaging of the abdomen and pelvis was performed using the standard protocol following bolus administration of intravenous contrast. RADIATION DOSE REDUCTION: This exam was performed according to the departmental dose-optimization program which includes automated exposure control, adjustment of the mA and/or kV according to patient size and/or use of iterative reconstruction technique. CONTRAST:  75mL OMNIPAQUE IOHEXOL 350 MG/ML SOLN COMPARISON:  CT of the abdomen pelvis with contrast 01/29/2019. FINDINGS: Lower chest: Centrilobular emphysematous changes are present at the lung bases. No superimposed airspace disease is present. Dense coronary artery calcifications are present. Heart size is normal. Hepatobiliary: The common bile duct measures up to 9 mm. Mild intrahepatic biliary dilation has progressed slightly. No focal lesions are present. Pancreas: Unremarkable. No pancreatic ductal dilatation or surrounding inflammatory changes. Spleen: Normal in size without focal abnormality. Adrenals/Urinary Tract: Adrenal glands are normal bilaterally. In intermediate density lesion at the upper pole of the right kidney is stable measuring 1.7 cm. A  simple cyst posteriorly in the right kidney measures 1.6 cm. Two subcentimeter simple cysts are present in the pole of the left kidney. No solid lesions are present. No obstruction is present. Stomach/Bowel: Stomach and duodenum are within normal limits. Mild small bowel dilation measures up to 2.5 cm. Wall thickening is present at the terminal ileum and ileocecal valve. No definite mass lesion is present. Colon is mostly collapsed beyond the level. Diverticular changes are present in the descending colon without focal inflammation. Vascular/Lymphatic: Atherosclerotic calcifications are present the aorta and branch vessels without aneurysm. Dense calcifications result in significant stenosis in the internal iliac arteries bilaterally, right greater than left. No significant adenopathy is present. Reproductive: Prostate is unremarkable. Other: Fat extends into the left inguinal canal. No associated bowel is present. No other significant ventral hernia is present. No free fluid or free air is present. Musculoskeletal: Bilateral pars defects are again noted at L5. Facet degenerative changes are present at L3-4 and L4-5. No focal osseous lesions are present. The bony pelvis is normal. The hips are located and within normal limits. IMPRESSION: 1. Mild small bowel  dilation measures up to 2.5 cm. Wall thickening at the terminal ileum and ileocecal valve. While the finding may be related to inflammatory disease, a mass lesion is not excluded. Colonoscopy may be useful for further evaluation. 2. Descending colonic diverticulosis without diverticulitis. 3. Fat extends into the left inguinal canal. No associated bowel is present. 4. Stable intermediate density lesion at the upper pole of the right kidney measuring 1.7 cm. This likely represents a hemorrhagic or proteinaceous cyst. Recommend no imaging follow-up. 5. 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 6. Coronary artery disease. 7. Dense calcifications  result in significant stenosis in the internal iliac arteries bilaterally, right greater than left. 8. Aortic Atherosclerosis (ICD10-I70.0) and Emphysema (ICD10-J43.9). Electronically Signed   By: Marin Roberts M.D.   On: 07/09/2023 13:36    Procedures Procedures    Medications Ordered in ED Medications  0.9 %  sodium chloride infusion (0 mLs Intravenous Stopped 07/09/23 1407)  ondansetron (ZOFRAN) injection 4 mg (4 mg Intravenous Patient Refused/Not Given 07/09/23 1111)  iohexol (OMNIPAQUE) 350 MG/ML injection 75 mL (has no administration in time range)  iohexol (OMNIPAQUE) 350 MG/ML injection 75 mL (75 mLs Intravenous Contrast Given 07/09/23 1143)    ED Course/ Medical Decision Making/ A&P                                 Medical Decision Making Elderly male with malignancy, COPD, MI, now presents with abdominal pain, diarrhea, weakness.  Patient is also hypertensive initially, concern for fluid loss versus sepsis versus progression of disease versus other intra-abdominal processes. CT, labs, monitoring ordered. Cardiac 95 sinus normal Pulse ox 94% borderline   Amount and/or Complexity of Data Reviewed Independent Historian: EMS External Data Reviewed: notes. Labs: ordered. Decision-making details documented in ED Course. Radiology: ordered and independent interpretation performed. Decision-making details documented in ED Course.  Risk Prescription drug management. Decision regarding hospitalization. Diagnosis or treatment significantly limited by social determinants of health.   4:01 PM Patient resting.  CT consistent with possible mass in the ascending colon.  Now, on reviewing patient's chart is good that he is seeing gastroenterology within the past year, with acknowledgment of possible right sided mass, he had description of the patient is not a candidate for surgical resection. Patient is calm, vital signs remain unremarkable aside from mild tachycardia, labs are  pending on signout.        Final Clinical Impression(s) / ED Diagnoses Final diagnoses:  Generalized abdominal pain    Rx / DC Orders ED Discharge Orders     None         Gerhard Munch, MD 07/09/23 1601

## 2023-07-10 ENCOUNTER — Telehealth: Payer: Self-pay | Admitting: Oncology

## 2023-07-10 ENCOUNTER — Telehealth: Payer: Self-pay | Admitting: Gastroenterology

## 2023-07-10 DIAGNOSIS — R197 Diarrhea, unspecified: Secondary | ICD-10-CM | POA: Diagnosis not present

## 2023-07-10 DIAGNOSIS — A0472 Enterocolitis due to Clostridium difficile, not specified as recurrent: Secondary | ICD-10-CM

## 2023-07-10 DIAGNOSIS — K6389 Other specified diseases of intestine: Secondary | ICD-10-CM

## 2023-07-10 DIAGNOSIS — R933 Abnormal findings on diagnostic imaging of other parts of digestive tract: Secondary | ICD-10-CM

## 2023-07-10 DIAGNOSIS — N179 Acute kidney failure, unspecified: Secondary | ICD-10-CM | POA: Diagnosis not present

## 2023-07-10 LAB — CBC
HCT: 28.2 % — ABNORMAL LOW (ref 39.0–52.0)
Hemoglobin: 9.3 g/dL — ABNORMAL LOW (ref 13.0–17.0)
MCH: 30.7 pg (ref 26.0–34.0)
MCHC: 33 g/dL (ref 30.0–36.0)
MCV: 93.1 fL (ref 80.0–100.0)
Platelets: 220 10*3/uL (ref 150–400)
RBC: 3.03 MIL/uL — ABNORMAL LOW (ref 4.22–5.81)
RDW: 14.6 % (ref 11.5–15.5)
WBC: 7 10*3/uL (ref 4.0–10.5)
nRBC: 0 % (ref 0.0–0.2)

## 2023-07-10 LAB — BASIC METABOLIC PANEL
Anion gap: 12 (ref 5–15)
BUN: 95 mg/dL — ABNORMAL HIGH (ref 8–23)
CO2: 15 mmol/L — ABNORMAL LOW (ref 22–32)
Calcium: 6.9 mg/dL — ABNORMAL LOW (ref 8.9–10.3)
Chloride: 106 mmol/L (ref 98–111)
Creatinine, Ser: 6.1 mg/dL — ABNORMAL HIGH (ref 0.61–1.24)
GFR, Estimated: 9 mL/min — ABNORMAL LOW (ref >60)
Glucose, Bld: 97 mg/dL (ref 70–99)
Potassium: 4.5 mmol/L (ref 3.5–5.1)
Sodium: 133 mmol/L — ABNORMAL LOW (ref 135–145)

## 2023-07-10 LAB — TSH: TSH: 1.351 u[IU]/mL (ref 0.350–4.500)

## 2023-07-10 LAB — C DIFFICILE QUICK SCREEN W PCR REFLEX
C Diff antigen: POSITIVE — AB
C Diff toxin: NEGATIVE

## 2023-07-10 LAB — CLOSTRIDIUM DIFFICILE BY PCR, REFLEXED: Toxigenic C. Difficile by PCR: NEGATIVE

## 2023-07-10 MED ORDER — MIDODRINE HCL 5 MG PO TABS
5.0000 mg | ORAL_TABLET | Freq: Three times a day (TID) | ORAL | Status: DC
  Administered 2023-07-10 – 2023-07-11 (×5): 5 mg via ORAL
  Filled 2023-07-10 (×6): qty 1

## 2023-07-10 MED ORDER — VITAMIN D 25 MCG (1000 UNIT) PO TABS
2000.0000 [IU] | ORAL_TABLET | Freq: Every day | ORAL | Status: DC
  Administered 2023-07-10 – 2023-07-13 (×4): 2000 [IU] via ORAL
  Filled 2023-07-10 (×4): qty 2

## 2023-07-10 MED ORDER — VANCOMYCIN HCL 125 MG PO CAPS
125.0000 mg | ORAL_CAPSULE | Freq: Four times a day (QID) | ORAL | Status: DC
  Administered 2023-07-10 – 2023-07-11 (×2): 125 mg via ORAL
  Filled 2023-07-10 (×2): qty 1

## 2023-07-10 MED ORDER — CHLORHEXIDINE GLUCONATE CLOTH 2 % EX PADS
6.0000 | MEDICATED_PAD | Freq: Every day | CUTANEOUS | Status: DC
  Administered 2023-07-10 – 2023-07-13 (×4): 6 via TOPICAL

## 2023-07-10 MED ORDER — ASPIRIN 81 MG PO TBEC
81.0000 mg | DELAYED_RELEASE_TABLET | Freq: Every day | ORAL | Status: DC
  Administered 2023-07-10 – 2023-07-13 (×4): 81 mg via ORAL
  Filled 2023-07-10 (×4): qty 1

## 2023-07-10 MED ORDER — ATORVASTATIN CALCIUM 10 MG PO TABS
20.0000 mg | ORAL_TABLET | Freq: Every day | ORAL | Status: DC
  Administered 2023-07-10 – 2023-07-13 (×4): 20 mg via ORAL
  Filled 2023-07-10 (×4): qty 2

## 2023-07-10 MED ORDER — ALBUTEROL SULFATE (2.5 MG/3ML) 0.083% IN NEBU: 2.5000 mg | INHALATION_SOLUTION | Freq: Four times a day (QID) | RESPIRATORY_TRACT | Status: DC | PRN

## 2023-07-10 MED ORDER — FIDAXOMICIN 200 MG PO TABS: 200.0000 mg | ORAL_TABLET | Freq: Two times a day (BID) | ORAL | Status: DC

## 2023-07-10 MED ORDER — PANTOPRAZOLE SODIUM 40 MG PO TBEC
40.0000 mg | DELAYED_RELEASE_TABLET | Freq: Every day | ORAL | Status: DC
  Administered 2023-07-10 – 2023-07-13 (×4): 40 mg via ORAL
  Filled 2023-07-10 (×4): qty 1

## 2023-07-10 MED ORDER — HEPARIN SODIUM (PORCINE) 5000 UNIT/ML IJ SOLN
5000.0000 [IU] | Freq: Three times a day (TID) | INTRAMUSCULAR | Status: DC
  Administered 2023-07-10 – 2023-07-13 (×9): 5000 [IU] via SUBCUTANEOUS
  Filled 2023-07-10 (×9): qty 1

## 2023-07-10 MED ORDER — TAMSULOSIN HCL 0.4 MG PO CAPS
0.4000 mg | ORAL_CAPSULE | Freq: Every day | ORAL | Status: DC
  Administered 2023-07-10 – 2023-07-12 (×3): 0.4 mg via ORAL
  Filled 2023-07-10 (×3): qty 1

## 2023-07-10 MED ORDER — VANCOMYCIN HCL 125 MG PO CAPS
125.0000 mg | ORAL_CAPSULE | Freq: Four times a day (QID) | ORAL | Status: DC
  Administered 2023-07-10: 125 mg via ORAL
  Filled 2023-07-10: qty 1

## 2023-07-10 NOTE — Telephone Encounter (Signed)
Contacted wife to advise that we will leave patient on schedule until hospital course has been completed. However, she says that he is too weak from C Diff and they are "not even sure he will make it." She states that he has an enlarging colon mass and has expressed to her that he does not want to go forward with treatment of this. She has again asked that we cancel 07/17/23 office visit as well as 08/28/23 hospital colonoscopy procedure. Both appointments have been cancelled as per her request.

## 2023-07-10 NOTE — Evaluation (Signed)
Physical Therapy Evaluation Patient Details Name: William Lee MRN: [id number] DOB: Mar 28, 1946 Today's Date: 07/10/2023  History of Present Illness  77 yo male presents to [facility] on 9/9 with abdominal pain, weakness. Workup for AKI, hypotension. PMH include lung cancer 2022 on 2LO2 all times, papilloma of the larynx, COPD, ischemic cardiomyopathy, EF equal 45% in 2019, chronic anemia and guaiac positive stools, colonic mass recently diagnosed.  Clinical Impression   Pt presents with generalized weakness, impaired balance, impaired activity tolerance. Pt to benefit from acute PT to address deficits. Pt ambulated short room distance with assist of RW and light steadying, pt fatiguing quickly <3 minutes standing. At baseline pt is independent, would benefit from acute and post-acute PT services. PT to progress mobility as tolerated, and will continue to follow acutely.          If plan is discharge home, recommend the following: A little help with walking and/or transfers;A little help with bathing/dressing/bathroom   Can travel by private vehicle        Equipment Recommendations Rolling walker (2 wheels)  Recommendations for Other Services       Functional Status Assessment Patient has had a recent decline in their functional status and demonstrates the ability to make significant improvements in function in a reasonable and predictable amount of time.     Precautions / Restrictions Precautions Precautions: Fall Precaution Comments: 2LO2 chronically Restrictions Weight Bearing Restrictions: No      Mobility  Bed Mobility Overal bed mobility: Needs Assistance Bed Mobility: Supine to Sit, Sit to Supine     Supine to sit: Min assist, HOB elevated Sit to supine: Min assist, HOB elevated   General bed mobility comments: assist for LE management, increased time and effort    Transfers Overall transfer level: Needs assistance Equipment used: Rolling walker (2 wheels) Transfers:  Sit to/from Stand Sit to Stand: Min assist           General transfer comment: assist for rise and steady, pt with LE trembling but steadies with continued standing    Ambulation/Gait Ambulation/Gait assistance: Min assist Gait Distance (Feet): 5 Feet Assistive device: Rolling walker (2 wheels) Gait Pattern/deviations: Step-through pattern, Decreased stride length, Trunk flexed Gait velocity: decr     General Gait Details: steadying and lines/leads assist, pt standing tolerance x3 minutes with lateral stepping, forward stepping, mini squats. HRmax observed 121 bpm  Stairs            Wheelchair Mobility     Tilt Bed    Modified Rankin (Stroke Patients Only)       Balance Overall balance assessment: Needs assistance Sitting-balance support: No upper extremity supported, Feet supported Sitting balance-Leahy Scale: Fair     Standing balance support: Bilateral upper extremity supported, During functional activity Standing balance-Leahy Scale: Poor Standing balance comment: reliant on external support                             Pertinent Vitals/Pain Pain Assessment Pain Assessment: Faces Faces Pain Scale: Hurts a little bit Pain Location: abdomen Pain Descriptors / Indicators: Discomfort, Tightness Pain Intervention(s): Limited activity within patient's tolerance, Monitored during session, Repositioned    Home Living Family/patient expects to be discharged to:: Private residence Living Arrangements: Spouse/significant other Available Help at Discharge: Family Type of Home: House Home Access: Ramped entrance       Home Layout: One level Home Equipment: Wheelchair - manual;Cane - single point  Prior Function Prior Level of Function : Independent/Modified Independent             Mobility Comments: pt reports no AD use normally, endorses no falls       Extremity/Trunk Assessment   Upper Extremity Assessment Upper Extremity  Assessment: Defer to OT evaluation (UE tremors)    Lower Extremity Assessment Lower Extremity Assessment: Generalized weakness    Cervical / Trunk Assessment Cervical / Trunk Assessment: Normal  Communication   Communication Communication: Other (comment) (hoarse, soft spoken)  Cognition Arousal: Alert Behavior During Therapy: WFL for tasks assessed/performed Overall Cognitive Status: Within Functional Limits for tasks assessed                                          General Comments      Exercises     Assessment/Plan    PT Assessment Patient needs continued PT services  PT Problem List Decreased strength;Decreased mobility;Decreased activity tolerance;Decreased balance;Decreased knowledge of use of DME;Pain;Cardiopulmonary status limiting activity;Decreased safety awareness       PT Treatment Interventions DME instruction;Therapeutic activities;Gait training;Therapeutic exercise;Patient/family education;Balance training;Stair training;Functional mobility training;Neuromuscular re-education    PT Goals (Current goals can be found in the Care Plan section)  Acute Rehab PT Goals PT Goal Formulation: With patient Time For Goal Achievement: 07/24/23 Potential to Achieve Goals: Good    Frequency Min 1X/week     Co-evaluation               AM-PAC PT "6 Clicks" Mobility  Outcome Measure Help needed turning from your back to your side while in a flat bed without using bedrails?: A Little Help needed moving from lying on your back to sitting on the side of a flat bed without using bedrails?: A Little Help needed moving to and from a bed to a chair (including a wheelchair)?: A Little Help needed standing up from a chair using your arms (e.g., wheelchair or bedside chair)?: A Little Help needed to walk in hospital room?: A Little Help needed climbing 3-5 steps with a railing? : A Lot 6 Click Score: 17    End of Session Equipment Utilized During  Treatment: Oxygen Activity Tolerance: Patient tolerated treatment well Patient left: in bed;with call bell/phone within reach;with bed alarm set;with family/visitor present Nurse Communication: Mobility status PT Visit Diagnosis: Other abnormalities of gait and mobility (R26.89);Muscle weakness (generalized) (M62.81)    Time: 1610-9604 PT Time Calculation (min) (ACUTE ONLY): 37 min   Charges:   PT Evaluation $PT Eval Low Complexity: 1 Low PT Treatments $Therapeutic Activity: 8-22 mins PT General Charges $$ ACUTE PT VISIT: 1 Visit         Marye Round, PT DPT Acute Rehabilitation Services Secure Chat Preferred  Office [phone]   Deazia Lampi Sheliah Plane 07/10/2023, 4:47 PM

## 2023-07-10 NOTE — Plan of Care (Signed)
  Problem: Education: Goal: Knowledge of General Education information will improve Description Including pain rating scale, medication(s)/side effects and non-pharmacologic comfort measures Outcome: Progressing   Problem: Clinical Measurements: Goal: Ability to maintain clinical measurements within normal limits will improve Outcome: Progressing Goal: Respiratory complications will improve Outcome: Progressing   Problem: Activity: Goal: Risk for activity intolerance will decrease Outcome: Progressing   Problem: Safety: Goal: Ability to remain free from injury will improve Outcome: Progressing   Problem: Skin Integrity: Goal: Risk for impaired skin integrity will decrease Outcome: Progressing   

## 2023-07-10 NOTE — Telephone Encounter (Signed)
Pt's spouse called to notify us that pt is currently admitted in the hospital so she wanted to cx his upcoming appt.   She wanted to make sure you have been notified since you have been taking great care of him.

## 2023-07-10 NOTE — Telephone Encounter (Signed)
Inbound call from patient's wife stating patient has been admitted to [facility]. States patient had a CT scan and mass that is in colon has gotten larger. Patient's wife states patient will not be able to make it to future appointments. Please advise, thank you.

## 2023-07-10 NOTE — Progress Notes (Signed)
Wife was updated by phone, foley was inserted for obstruction, 600 cc immediate urine output. Huey Bienenstock MD

## 2023-07-10 NOTE — Telephone Encounter (Signed)
Dr Barron Alvine- [place]....  Patient also has appointment to "discuss procedure" with you on 07/17/23. Do you want me to cancel that appointment or keep it pending hospital course?

## 2023-07-10 NOTE — Plan of Care (Signed)

## 2023-07-10 NOTE — Progress Notes (Signed)
PROGRESS NOTE    LD EPLIN  QVZ:[id number] DOB: December 07, 1945 DOA: 07/09/2023 PCP: Kaleen Mask, MD    Chief Complaint  Patient presents with   Abdominal Pain    Brief Narrative:   William Lee is a 77 y.o. male with medical history significant for lung cancer in 2022, now on 2 L O2 nasal cannula at all times, papilloma of the larynx, COPD, ischemic cardiomyopathy, EF equal 45% in 2019, chronic anemia and guaiac positive stools, colonic mass recently diagnosed, following with Dr. Leander Rams, likely with plan for colonoscopy as an outpatient, patient presents to ED generalized weakness, fatigue, abdominal pain, his workup significant for AKI with a creatinine of 7, hypotension, CT abdomen pelvis with colon mass, [facility] hospitalist consulted to admit.     Assessment & Plan:   Principal Problem:   Acute renal failure (ARF) (HCC)   Acute renal failure Hyponatremia -This appears to be secondary to volume depletion, prerenal dehydration in the setting of profuse diarrhea, well with antihypertensive medication use, NSAID use, and hypertension . -Continue with IV fluids  -Avoid nephrotoxic medications  -Urinary retention, had 300 cc postvoid bladder scan after 400 cc urine output, monitor closely and if needed will insert Foley catheter, will start on Flomax .   Hypertension Hypertension -Patient with known history of hypertension, he is actually hypotensive, so he was started on midodrine, will hold antihypertensive medication   C. difficile diarrhea -GI panel still pending -C. difficile is positive for antigen, but negative for toxin, given patient is quite symptomatic, will start on vancomycin   Colonic mass -Patient has been followed by GI as an outpatient, is following with Dr. Barron Alvine, supposed to follow as an outpatient regarding colonoscopy   Chronic respiratory failure on 2 L O2 nasal cannula at all times -continue O2 and nebs.   Hoarseness due to  papilloma of the larynx  Hyperlipidemia - Continue with statin   GERD - Will hold Protonix given C. difficile positive for antigen  PCM -Will consult nutritionist  DVT prophylaxis:  heparin Code Status: Full code Family Communication: None at bedside Disposition:   Status is: Inpatient    Consultants:  GI   Subjective:  Patient reports generalized weakness, fatigue, still having diarrhea. Objective: Vitals:   07/10/23 0038 07/10/23 0521 07/10/23 0800 07/10/23 0849  BP: (!) 102/53 (!) 92/56  (!) 99/59  Pulse: 89 82  86  Resp: (!) 22 18  20   Temp: 98.2 F (36.8 C) 98.5 F (36.9 C) 98 F (36.7 C)   TempSrc: Oral Oral    SpO2: 97% 100%  90%  Weight:      Height:        Intake/Output Summary (Last 24 hours) at 07/10/2023 1259 Last data filed at 07/10/2023 0900 Gross per 24 hour  Intake 999 ml  Output 650 ml  Net 349 ml   Filed Weights   07/09/23 1854  Weight: 48.4 kg    Examination:  Awake Alert, Oriented X 3, No new F.N deficits, extremely frail, deconditioned Symmetrical Chest wall movement, Good air movement bilaterally, CTAB RRR,No Gallops,Rubs or new Murmurs, No Parasternal Heave +ve B.Sounds, Abd Soft, suprapubic fullness and tenderness No Cyanosis, Clubbing or edema, No new Rash or bruise      Data Reviewed: I have personally reviewed following labs and imaging studies  CBC: Recent Labs  Lab 07/09/23 1550 07/10/23 0248  WBC 8.2 7.0  NEUTROABS 7.0  --   HGB 10.7* 9.3*  HCT 33.3* 28.2*  MCV 95.4 93.1  PLT 228 220    Basic Metabolic Panel: Recent Labs  Lab 07/09/23 1550 07/10/23 0248  NA 128* 133*  K 4.9 4.5  CL 102 106  CO2 14* 15*  GLUCOSE 101* 97  BUN 92* 95*  CREATININE 7.70* 6.10*  CALCIUM 6.6* 6.9*    GFR: Estimated Creatinine Clearance: 6.8 mL/min (A) (by C-G formula based on SCr of 6.1 mg/dL (H)).  Liver Function Tests: Recent Labs  Lab 07/09/23 1550  AST 14*  ALT 13  ALKPHOS 55  BILITOT 0.8  PROT 5.2*   ALBUMIN 2.5*    CBG: No results for input(s): "GLUCAP" in the last 168 hours.   Recent Results (from the past 240 hour(s))  C Difficile Quick Screen w PCR reflex     Status: Abnormal   Collection Time: 07/10/23  6:46 AM   Specimen: STOOL  Result Value Ref Range Status   C Diff antigen POSITIVE (A) NEGATIVE Final   C Diff toxin NEGATIVE NEGATIVE Final   C Diff interpretation   Final    Results are indeterminate. Please contact the provider listed for your campus for C diff questions in AMION.    Comment: Performed at [facility], [address], [place], [place] [address]         Radiology Studies: CT ABDOMEN PELVIS W CONTRAST  Result Date: 07/09/2023 CLINICAL DATA:  Abdominal pain.  Personal history of lung cancer EXAM: CT ABDOMEN AND PELVIS WITH CONTRAST TECHNIQUE: Multidetector CT imaging of the abdomen and pelvis was performed using the standard protocol following bolus administration of intravenous contrast. RADIATION DOSE REDUCTION: This exam was performed according to the departmental dose-optimization program which includes automated exposure control, adjustment of the mA and/or kV according to patient size and/or use of iterative reconstruction technique. CONTRAST:  75mL OMNIPAQUE IOHEXOL 350 MG/ML SOLN COMPARISON:  CT of the abdomen pelvis with contrast 01/29/2019. FINDINGS: Lower chest: Centrilobular emphysematous changes are present at the lung bases. No superimposed airspace disease is present. Dense coronary artery calcifications are present. Heart size is normal. Hepatobiliary: The common bile duct measures up to 9 mm. Mild intrahepatic biliary dilation has progressed slightly. No focal lesions are present. Pancreas: Unremarkable. No pancreatic ductal dilatation or surrounding inflammatory changes. Spleen: Normal in size without focal abnormality. Adrenals/Urinary Tract: Adrenal glands are normal bilaterally. In intermediate density lesion at the upper pole of the  right kidney is stable measuring 1.7 cm. A simple cyst posteriorly in the right kidney measures 1.6 cm. Two subcentimeter simple cysts are present in the pole of the left kidney. No solid lesions are present. No obstruction is present. Stomach/Bowel: Stomach and duodenum are within normal limits. Mild small bowel dilation measures up to 2.5 cm. Wall thickening is present at the terminal ileum and ileocecal valve. No definite mass lesion is present. Colon is mostly collapsed beyond the level. Diverticular changes are present in the descending colon without focal inflammation. Vascular/Lymphatic: Atherosclerotic calcifications are present the aorta and branch vessels without aneurysm. Dense calcifications result in significant stenosis in the internal iliac arteries bilaterally, right greater than left. No significant adenopathy is present. Reproductive: Prostate is unremarkable. Other: Fat extends into the left inguinal canal. No associated bowel is present. No other significant ventral hernia is present. No free fluid or free air is present. Musculoskeletal: Bilateral pars defects are again noted at L5. Facet degenerative changes are present at L3-4 and L4-5. No focal osseous lesions are present. The bony pelvis is normal.  The hips are located and within normal limits. IMPRESSION: 1. Mild small bowel dilation measures up to 2.5 cm. Wall thickening at the terminal ileum and ileocecal valve. While the finding may be related to inflammatory disease, a mass lesion is not excluded. Colonoscopy may be useful for further evaluation. 2. Descending colonic diverticulosis without diverticulitis. 3. Fat extends into the left inguinal canal. No associated bowel is present. 4. Stable intermediate density lesion at the upper pole of the right kidney measuring 1.7 cm. This likely represents a hemorrhagic or proteinaceous cyst. Recommend no imaging follow-up. 5. 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 6.  Coronary artery disease. 7. Dense calcifications result in significant stenosis in the internal iliac arteries bilaterally, right greater than left. 8. Aortic Atherosclerosis (ICD10-I70.0) and Emphysema (ICD10-J43.9). Electronically Signed   By: Marin Roberts M.D.   On: 07/09/2023 13:36        Scheduled Meds:  aspirin EC  81 mg Oral Daily   atorvastatin  20 mg Oral Daily   cholecalciferol  2,000 Units Oral Daily   midodrine  5 mg Oral TID WC   ondansetron (ZOFRAN) IV  4 mg Intravenous Once   pantoprazole  40 mg Oral Daily   Continuous Infusions:  sodium chloride 125 mL/hr at 07/10/23 1143     LOS: 1 day    Huey Bienenstock, MD [facility]   To contact the attending provider between 7A-7P or the covering provider during after hours 7P-7A, please log into the web site [email or web address] and access using universal [place] password for that web site. If you do not have the password, please call the hospital operator.  07/10/2023, 12:59 PM

## 2023-07-10 NOTE — Progress Notes (Signed)
   07/10/23 0829  TOC Brief Assessment  Insurance and Status Reviewed  Patient has primary care physician Yes  Home environment has been reviewed from home  Prior level of function: self care  Prior/Current Home Services No current home services  Social Determinants of Health Reivew SDOH reviewed no interventions necessary  Readmission risk has been reviewed Yes  Transition of care needs no transition of care needs at this time

## 2023-07-10 NOTE — Consult Note (Addendum)
Consultation Note   Referring Provider:  [facility] PCP: Kaleen Mask, MD Primary Gastroenterologist: Dr Barron Alvine         Reason for Consultation: diarrhea   DOA: 07/09/2023         Hospital Day: 2   Attending physician's note  I have taken a history, reviewed the chart and examined the patient. I performed a substantive portion of this encounter, including complete performance of at least one of the key components, in conjunction with the APP. I agree with the APP's note, impression and recommendations.   77 yr M with history of CAD, severe COPD on home O2, lung cancer, heme positive stool with iron deficiency anemia, IC valve and cecal thickening on CT concerning for possible colonic mass admitted with diarrhea and severe AKI  C. difficile antigen positive on oral vancomycin with improvement of bowel frequency  AKI: Secondary to volume depletion with severe diarrhea, on IV fluids, per primary team  Patient is not interested in undergoing colonoscopy at this point, he does not want to pursue it.  He will contact our office if he changes his mind  GI will sign off, please call with any questions  The patient was provided an opportunity to ask questions and all were answered. The patient agreed with the plan and demonstrated an understanding of the instructions.  Iona Beard , MD [phone]     ASSESSMENT & PLAN   77 y.o. year old male with medical history of CAD previous bypass 45, COPD on oxygen at home, PVD, lung cancer diagnosis 2022, dyslipidemia, previous papilloma of larynx, anemia with guaiac positive stools, colonic mass recently diagnosed, with plan for colonoscopy outpatient. Patient presents to ED with generalized weakness, fatigue, diarrhea, and abdominal pain. His workup significant for AKI with a creatinine of 7, hypotension, CT abdomen pelvis with colon mass. GI consulted for severe diarrhea.  Acute  renal failure  Creatinine 1.19 (1 mth ago) 7.70 upon admission and today 6.10. BUN 25>92>95. Urinary retention, had 300 cc postvoid bladder scan after 400 cc urine output, started on Flomax. Foley inserted. Hyponatremia [phone] -This appears to be secondary to volume depletion, dehydration in the setting of profuse diarrhea, -IV fluids  -Avoid nephrotoxic medications   History of iron deficiency anemia. Multifactorial. Major component is iron deficiency from chronic GI blood loss. Contributing factors are chronic illness and chronic kidney disease requiring IV iron, FOBT+.Started on protonix 40 mg daily (on hold).Hgb 12.3>10.7>9.3. Iron 47 (04/2023) Ferritin 439 (05/2023). 6 week follow-up with oncology. On aspirin 81mg  po daily.  C. difficile diarrhea. Patient reports severe diarrhea since Friday. No outside exposure. No recent travel. Newest medications include gabapentin and Pantoprazole. -GI panel still pending -C. difficile is positive for antigen, but negative for toxin, given patient is quite symptomatic patient started on vancomycin 125mg  QID  Colonic mass. Results from the virtual colonoscopy in 09/2019 were reviewed by Dr. Barron Alvine and showed no fixed polyps or annular constricting lesions in the area of filling defect in the mid to distal transverse colon appears to be tagged with barium and likely to be adherent stool. PET/CT at [facility] in 02/2021 with focus of FDG avidity in the ascending colon, and more recently with FOBT+ stool and  elevated CEA at 18.9, concerning for malignancy. Due to his severe COPD he was felt to be a poor candidate for any endoscopic procedures with plan to treat anemia supportively. He was scheduled to see [facility] on 06/22/2023 for evaluation and determination of ability to tolerate sedation and any endoscopic procedures. PFT shows he is high risk. -Recommend colonoscopy during hospitalization, patient declines.  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    GERD -Protonix being held given C. difficile positive for antigen  Additional co-morbidities include :Patient with known history of hypertension, he is actually hypotensive, so he was started on midodrine, antihypertensive medication on hold.Hyperlipidemia on statin.   HISTORY OF PRESENT ILLNESS   Patient lying in bed. He reports he started to have severe diarrhea this past Friday going to the restroom every hour with loose urgent stools. He reports incontinence on multiple occasions. He would have over 7-8 stools per day. He is unsure if blood in stool. Admits to mild discomfort with bloating. No nausea or vomiting today. Poor appetite, not eating very much. Only new medications are gabapentin and pantoprazole (started in July). We discussed possible endoscopy and colonoscopy procedures during this admittance and he declines at this time. No fever chills. Reports his wife started to develop similar symptoms and is seeing her PCP today to be evaluated.  Previous GI Evaluations   CT ABDOMEN AND PELVIS WITH CONTRAST  9/9  IMPRESSION: 1. Mild small bowel dilation measures up to 2.5 cm. Wall thickening at the terminal ileum and ileocecal valve. While the finding may be related to inflammatory disease, a mass lesion is not excluded. Colonoscopy may be useful for further evaluation. 2. Descending colonic diverticulosis without diverticulitis. 3. Fat extends into the left inguinal canal. No associated bowel is present. 4. Stable intermediate density lesion at the upper pole of the right kidney measuring 1.7 cm. This likely represents a hemorrhagic or proteinaceous cyst. Recommend no imaging follow-up. 5. 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 6. Coronary artery disease. 7. Dense calcifications result in significant stenosis in the internal iliac arteries bilaterally, right  greater than left. 8. Aortic Atherosclerosis (ICD10-I70.0) and Emphysema (ICD10-J43.9)  09/23/2013 EGD and colonoscopy at the hospital with Dr. Randa Evens for FOBT positive stool-multiple polyps removed ascending, transverse and sigmoid colon, 2 cm polyp transverse colon piecemeal removed with net.  Internal hemorrhoids EGD unremarkable.  Small hiatal hernia.  All polyps were tubular adenomatous polyps.   10/30/2019 CT virtual colonoscopy with ordered by [facility] Dr. showed no fixed on barium tag polypoid filling defects or annular constricting lesions.  Diverticula.There is a non mobile filling defect noted in the mid to distal transverse colon, but this appears to be tagged with barium and likely adherent stool.  Labs and Imaging:  ECHOCARDIOGRAM  12/2017  LV EF: 40% -   45%    Recent Labs    07/09/23 1550 07/10/23 0248  WBC 8.2 7.0  HGB 10.7* 9.3*  HCT 33.3* 28.2*  PLT 228 220   Recent Labs    07/09/23 1550 07/10/23 0248  NA 128* 133*  K 4.9 4.5  CL 102 106  CO2 14* 15*  GLUCOSE 101* 97  BUN 92* 95*  CREATININE 7.70* 6.10*  CALCIUM 6.6* 6.9*   Recent Labs    07/09/23 1550  PROT 5.2*  ALBUMIN 2.5*  AST 14*  ALT 13  ALKPHOS 55  BILITOT 0.8   No results for input(s): "HEPBSAG", "HCVAB", "HEPAIGM", "HEPBIGM" in the last 72 hours. No results for input(s): "LABPROT", "INR" in the last 72 hours.    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   bilateral hearing aids used   Hyperlipidemia    Hypertension    Ischemic heart disease    left Inguinal hernia 02/01/2012    Repaired 05/01/12    MI (myocardial infarction) (HCC)    Mixed hyperlipidemia 05/16/2017   Papilloma of larynx    PONV (postoperative nausea and vomiting)    Shortness of breath 11/11/2011   Oxygen @ 2L via [place] prn   SOB (shortness of breath) on exertion     Past Surgical History:  Procedure Laterality Date   CARDIAC CATHETERIZATION     CARPAL TUNNEL RELEASE Right 06/10/2021   Procedure: Right Carpal Tunnel Release;  Surgeon: Maeola Harman, MD;  Location: [facility] OR;  Service: Neurosurgery;  Laterality: Right;  RM 21   CARPAL TUNNEL RELEASE Left 08/01/2021   Procedure: Left carpal tunnel release;  Surgeon: Maeola Harman, MD;  Location: [facility] OR;  Service: Neurosurgery;  Laterality: Left;   CHOLECYSTECTOMY  12/1995   COLONOSCOPY WITH PROPOFOL N/A 09/23/2013   Procedure: COLONOSCOPY WITH PROPOFOL;  Surgeon: Vertell Novak., MD;  Location: [facility] ENDOSCOPY;  Service: Endoscopy;  Laterality: N/A;   CORONARY ARTERY BYPASS GRAFT     ESOPHAGOGASTRODUODENOSCOPY (EGD) WITH PROPOFOL N/A 09/23/2013   Procedure: ESOPHAGOGASTRODUODENOSCOPY (EGD) WITH PROPOFOL;  Surgeon: Vertell Novak., MD;  Location: [facility] ENDOSCOPY;  Service: Endoscopy;  Laterality: N/A;   HERNIA REPAIR     INGUINAL HERNIA REPAIR  05/01/2012   Procedure: HERNIA REPAIR INGUINAL ADULT;  Surgeon: Currie Paris, MD;  Location: [facility];  Service: General;  Laterality: Left;  inguinal   papalomas virus removal  3/10 7/10 5/11 10/12   PERIPHERAL VASCULAR CATHETERIZATION N/A 04/19/2016   Procedure: Abdominal Aortogram w/Lower Extremity;  Surgeon: Larina Earthly, MD;  Location: [facility] INVASIVE CV LAB;  Service: Cardiovascular;  Laterality: N/A;    Family History  Problem Relation Age of Onset   Liver cancer Mother    Bone cancer Father    Lung cancer Sister    Cancer Brother        unknown    Prior to Admission medications   Medication Sig Start Date End Date Taking? Authorizing Provider  albuterol (PROVENTIL HFA;VENTOLIN HFA)  108 (90 BASE) MCG/ACT inhaler Inhale 2 puffs into the lungs every 6 (six) hours as needed for wheezing.   Yes [provider]  aspirin 81 MG tablet Take 81 mg by mouth daily.   Yes [provider]  atorvastatin (LIPITOR) 20 MG tablet Take 20 mg by mouth daily. 06/22/15  Yes [provider]  Cholecalciferol 50 MCG (2000 UT) TABS Take 2,000 Units by mouth daily. 03/01/18  Yes [provider]  diltiazem (CARDIZEM CD) 120 MG 24 hr capsule Take 120 mg by mouth at bedtime.   Yes [provider]  Doxylamine-DM (VICKS NYQUIL COUGH) 6.25-15 MG/15ML LIQD Take 15 mLs by mouth 2 (two) times daily as needed (congestion/sleep).   Yes [provider]  Fluticasone-Umeclidin-Vilant 100-62.5-25 MCG/ACT AEPB Inhale 1 puff into the lungs daily. 06/27/23 06/26/24 Yes [provider]  gabapentin (NEURONTIN) 300 MG capsule Take 300 mg by mouth 2 (two) times daily.   Yes [provider]  Ibuprofen 200 MG CAPS Take 200 mg by mouth every 6 (six) hours as needed (pain/headache/fever).   Yes [provider]  OXYGEN Inhale 2 L into the lungs daily as needed (shortness of breath).   Yes [provider]  pantoprazole (PROTONIX) 40 MG tablet Take 1 tablet (40 mg total) by mouth daily. 05/22/23  Yes Quentin Mulling R, PA-C  polyvinyl alcohol (LIQUIFILM TEARS) 1.4 % ophthalmic solution Place 1 drop into both eyes as needed for dry eyes.   Yes [provider]  potassium chloride 20 MEQ/15ML (10%) SOLN Take 20 mEq by mouth 2 (two) times daily.    Yes [provider]  predniSONE (DELTASONE) 20 MG tablet Take 20 mg by mouth daily as needed (for fluid in lungs per patient).   Yes [provider]  valsartan (DIOVAN) 40 MG tablet TAKE 1 TABLET BY MOUTH EVERY DAY 01/01/23  Yes Nahser, Deloris Ping, MD  Camphor-Menthol-Methyl Sal [facility] EX) Apply 1 patch topically 2 (two) times daily as needed (pain).    [provider]     Current Facility-Administered Medications  Medication Dose Route Frequency Provider Last Rate Last Admin   0.9 %  sodium chloride infusion   Intravenous Continuous Buena Irish, MD 125 mL/hr at 07/10/23 1143 New Bag at 07/10/23 1143   albuterol (PROVENTIL) (2.5 MG/3ML) 0.083% nebulizer solution 2.5 mg  2.5 mg Inhalation Q6H PRN Elgergawy, Leana Roe, MD       aspirin EC tablet 81 mg  81 mg Oral Daily Elgergawy, Leana Roe, MD   81 mg at 07/10/23 1140   atorvastatin (LIPITOR) tablet 20 mg  20 mg Oral Daily Elgergawy, Leana Roe, MD   20 mg at 07/10/23 1139   bismuth subsalicylate (PEPTO BISMOL) chewable tablet 524 mg  524 mg Oral Q1H PRN Buena Irish, MD       cholecalciferol (VITAMIN D3) 25 MCG (1000 UNIT) tablet 2,000 Units  2,000 Units Oral Daily Elgergawy, Leana Roe, MD   2,000 Units at 07/10/23 1140   diphenoxylate-atropine (LOMOTIL) 2.5-0.025 MG per tablet 2 tablet  2 tablet Oral QID PRN Buena Irish, MD       heparin injection 5,000 Units  5,000 Units Subcutaneous Q8H Elgergawy, Leana Roe, MD       iohexol (OMNIPAQUE) 350 MG/ML injection 75 mL  75 mL Intravenous Once PRN Buena Irish, MD       midodrine (PROAMATINE) tablet 5 mg  5 mg Oral TID WC Elgergawy, Leana Roe, MD   5 mg at 07/10/23 1108   ondansetron (ZOFRAN) injection 4 mg  4 mg Intravenous Once Buena Irish, MD       ondansetron [facility]) tablet 4 mg  4 mg Oral Q6H PRN Buena Irish, MD       Or   ondansetron [facility]) injection 4 mg  4 mg Intravenous Q6H PRN Buena Irish, MD       pantoprazole (PROTONIX) EC tablet 40 mg  40 mg Oral Daily Elgergawy, Leana Roe, MD   40 mg at 07/10/23 1139   tamsulosin (FLOMAX) capsule 0.4 mg  0.4 mg Oral QPC supper Elgergawy, Leana Roe,  MD       vancomycin (VANCOCIN) capsule 125 mg  125 mg Oral QID Elgergawy, Leana Roe, MD        Allergies as of 07/09/2023 - Review Complete 07/09/2023  Allergen Reaction Noted   Silver Rash and Other (See Comments) 12/10/2014    Tape Rash 07/22/2014    Social History   Socioeconomic History   Marital status: Married    Spouse name: Not on file   Number of children: Not on file   Years of education: Not on file   Highest education level: Not on file  Occupational History   Not on file  Tobacco Use   Smoking status: Former    Current packs/day: 0.00    Types: Cigarettes    Quit date: 10/30/1990    Years since quitting: 32.7   Smokeless tobacco: Never  Vaping Use   Vaping status: Never Used  Substance and Sexual Activity   Alcohol use: No   Drug use: Yes    Types: Marijuana    Comment: Last use 06/25/21   Sexual activity: Not on file  Other Topics Concern   Not on file  Social History Narrative   Not on file   Social Determinants of Health   Financial Resource Strain: Not on file  Food Insecurity: No Food Insecurity (07/09/2023)   Hunger Vital Sign    Worried About Running Out of Food in the Last Year: Never true    Ran Out of Food in the Last Year: Never true  Transportation Needs: No Transportation Needs (07/09/2023)   PRAPARE - Administrator, Civil Service (Medical): No    Lack of Transportation (Non-Medical): No  Physical Activity: Not on file  Stress: Not on file  Social Connections: Not on file  Intimate Partner Violence: Not At Risk (07/09/2023)   Humiliation, Afraid, Rape, and Kick questionnaire    Fear of Current or Ex-Partner: No    Emotionally Abused: No    Physically Abused: No    Sexually Abused: No     Code Status   Code Status: Full Code  Review of Systems: All systems reviewed and negative except where noted in HPI.  Physical Exam: Vital signs in last 24 hours: Temp:  [98 F (36.7 C)-99.9 F (37.7 C)] 98 F (36.7 C) (09/10 0800) Pulse Rate:  [82-107] 86 (09/10 0849) Resp:  [17-24] 20 (09/10 0849) BP: (91-115)/(47-59) 99/59 (09/10 0849) SpO2:  [90 %-100 %] 90 % (09/10 0849) Weight:  [48.4 kg] 48.4 kg (09/09 1854) Last BM Date : 07/10/23  General:   Pleasant male in NAD Psych:  Cooperative. Normal mood and affect Eyes: Pupils equal Ears:  Normal auditory acuity Nose: No deformity, discharge or lesions Neck:  Supple, no masses felt Lungs:diminished lung sounds bilaterally, on N/C 2L Heart:  Regular rate, regular rhythm.  Abdomen:  Soft, distended, nontender, active bowel sounds, no masses felt GU: Foley catheter in place with yellow urine in collection bag Rectal :  Deferred Msk: Symmetrical without gross deformities.  Neurologic:  Alert, oriented, grossly normal neurologically Extremities : No edema Skin:  Intact without significant lesions. Some bruising noted.   Intake/Output from previous day: 09/09 0701 - 09/10 0700 In: 999 [I.V.:999] Out: 200 [Urine:200] Intake/Output this shift:  Total I/O In: -  Out: 450 [Urine:450]  Principal Problem:   Acute renal failure (ARF) (HCC)    Deanna May, NP-C @  07/10/2023, 1:41 PM

## 2023-07-11 ENCOUNTER — Other Ambulatory Visit (HOSPITAL_COMMUNITY): Payer: Self-pay

## 2023-07-11 ENCOUNTER — Inpatient Hospital Stay (HOSPITAL_COMMUNITY): Payer: Medicare Other

## 2023-07-11 ENCOUNTER — Other Ambulatory Visit (HOSPITAL_COMMUNITY): Payer: Medicare Other

## 2023-07-11 ENCOUNTER — Encounter: Payer: Self-pay | Admitting: Physician Assistant

## 2023-07-11 DIAGNOSIS — R197 Diarrhea, unspecified: Secondary | ICD-10-CM | POA: Diagnosis not present

## 2023-07-11 DIAGNOSIS — M7989 Other specified soft tissue disorders: Secondary | ICD-10-CM | POA: Diagnosis not present

## 2023-07-11 DIAGNOSIS — D649 Anemia, unspecified: Secondary | ICD-10-CM

## 2023-07-11 DIAGNOSIS — N179 Acute kidney failure, unspecified: Secondary | ICD-10-CM | POA: Diagnosis not present

## 2023-07-11 DIAGNOSIS — K6389 Other specified diseases of intestine: Secondary | ICD-10-CM | POA: Diagnosis not present

## 2023-07-11 DIAGNOSIS — E43 Unspecified severe protein-calorie malnutrition: Secondary | ICD-10-CM | POA: Insufficient documentation

## 2023-07-11 LAB — CBC
HCT: 31.5 % — ABNORMAL LOW (ref 39.0–52.0)
Hemoglobin: 10.1 g/dL — ABNORMAL LOW (ref 13.0–17.0)
MCH: 30.6 pg (ref 26.0–34.0)
MCHC: 32.1 g/dL (ref 30.0–36.0)
MCV: 95.5 fL (ref 80.0–100.0)
Platelets: 226 10*3/uL (ref 150–400)
RBC: 3.3 MIL/uL — ABNORMAL LOW (ref 4.22–5.81)
RDW: 14.8 % (ref 11.5–15.5)
WBC: 2.9 10*3/uL — ABNORMAL LOW (ref 4.0–10.5)
nRBC: 0 % (ref 0.0–0.2)

## 2023-07-11 LAB — GASTROINTESTINAL PANEL BY PCR, STOOL (REPLACES STOOL CULTURE)

## 2023-07-11 LAB — BASIC METABOLIC PANEL
Anion gap: 16 — ABNORMAL HIGH (ref 5–15)
BUN: 84 mg/dL — ABNORMAL HIGH (ref 8–23)
CO2: 10 mmol/L — ABNORMAL LOW (ref 22–32)
Calcium: 7.5 mg/dL — ABNORMAL LOW (ref 8.9–10.3)
Chloride: 112 mmol/L — ABNORMAL HIGH (ref 98–111)
Creatinine, Ser: 2.93 mg/dL — ABNORMAL HIGH (ref 0.61–1.24)
GFR, Estimated: 21 mL/min — ABNORMAL LOW (ref >60)
Glucose, Bld: 67 mg/dL — ABNORMAL LOW (ref 70–99)
Potassium: 4.5 mmol/L (ref 3.5–5.1)
Sodium: 138 mmol/L (ref 135–145)

## 2023-07-11 MED ORDER — AZITHROMYCIN 500 MG PO TABS
500.0000 mg | ORAL_TABLET | Freq: Every day | ORAL | Status: AC
  Administered 2023-07-11 – 2023-07-13 (×3): 500 mg via ORAL
  Filled 2023-07-11 (×3): qty 1

## 2023-07-11 MED ORDER — REVEFENACIN 175 MCG/3ML IN SOLN
175.0000 ug | Freq: Every day | RESPIRATORY_TRACT | Status: DC
  Administered 2023-07-12 – 2023-07-13 (×2): 175 ug via RESPIRATORY_TRACT
  Filled 2023-07-11 (×4): qty 3

## 2023-07-11 MED ORDER — GABAPENTIN 300 MG PO CAPS
300.0000 mg | ORAL_CAPSULE | Freq: Two times a day (BID) | ORAL | Status: DC
  Administered 2023-07-11 – 2023-07-13 (×5): 300 mg via ORAL
  Filled 2023-07-11 (×5): qty 1

## 2023-07-11 MED ORDER — DILTIAZEM HCL 30 MG PO TABS
30.0000 mg | ORAL_TABLET | Freq: Three times a day (TID) | ORAL | Status: DC
  Administered 2023-07-11 – 2023-07-13 (×7): 30 mg via ORAL
  Filled 2023-07-11 (×7): qty 1

## 2023-07-11 MED ORDER — SODIUM BICARBONATE 650 MG PO TABS
650.0000 mg | ORAL_TABLET | Freq: Two times a day (BID) | ORAL | Status: DC
  Administered 2023-07-11 – 2023-07-12 (×3): 650 mg via ORAL
  Filled 2023-07-11 (×3): qty 1

## 2023-07-11 MED ORDER — TRAMADOL HCL 50 MG PO TABS
25.0000 mg | ORAL_TABLET | Freq: Two times a day (BID) | ORAL | Status: DC | PRN
  Administered 2023-07-11: 25 mg via ORAL
  Filled 2023-07-11: qty 1

## 2023-07-11 MED ORDER — ALBUMIN HUMAN 25 % IV SOLN
25.0000 g | Freq: Once | INTRAVENOUS | Status: AC
  Administered 2023-07-11: 25 g via INTRAVENOUS
  Filled 2023-07-11: qty 100

## 2023-07-11 MED ORDER — ARFORMOTEROL TARTRATE 15 MCG/2ML IN NEBU
15.0000 ug | INHALATION_SOLUTION | Freq: Two times a day (BID) | RESPIRATORY_TRACT | Status: DC
  Administered 2023-07-11 – 2023-07-13 (×4): 15 ug via RESPIRATORY_TRACT
  Filled 2023-07-11 (×6): qty 2

## 2023-07-11 MED ORDER — LEVALBUTEROL HCL 0.63 MG/3ML IN NEBU: 0.6300 mg | INHALATION_SOLUTION | Freq: Four times a day (QID) | RESPIRATORY_TRACT | Status: DC | PRN

## 2023-07-11 MED ORDER — ACETAMINOPHEN 325 MG PO TABS
650.0000 mg | ORAL_TABLET | Freq: Four times a day (QID) | ORAL | Status: DC | PRN
  Administered 2023-07-11: 650 mg via ORAL
  Filled 2023-07-11: qty 2

## 2023-07-11 MED ORDER — ENSURE ENLIVE PO LIQD
237.0000 mL | Freq: Two times a day (BID) | ORAL | Status: DC
  Administered 2023-07-12 – 2023-07-13 (×3): 237 mL via ORAL

## 2023-07-11 MED ORDER — MIDODRINE HCL 5 MG PO TABS
10.0000 mg | ORAL_TABLET | Freq: Three times a day (TID) | ORAL | Status: DC
  Administered 2023-07-11 – 2023-07-13 (×5): 10 mg via ORAL
  Filled 2023-07-11 (×5): qty 2

## 2023-07-11 MED ORDER — BUDESONIDE 0.25 MG/2ML IN SUSP
0.2500 mg | Freq: Two times a day (BID) | RESPIRATORY_TRACT | Status: DC
  Administered 2023-07-11 – 2023-07-13 (×4): 0.25 mg via RESPIRATORY_TRACT
  Filled 2023-07-11 (×7): qty 2

## 2023-07-11 NOTE — Progress Notes (Addendum)
PROGRESS NOTE        PATIENT DETAILS Name: William Lee Age: 77 y.o. Sex: male Date of Birth: 02/10/1946 Admit Date: 07/09/2023 Admitting Physician Buena Irish, MD QQV:[id number], Curly Rim, MD  Brief Summary: Patient is a 77 y.o.  male with history of COPD on home O2, lung cancer-s/p SBRT August 2022, iron deficiency anemia-PET/CT at [facility] May 2022 with focus of FDG avidity in the ascending colon (felt to be a poor candidate for endoscopic procedures due to advanced COPD)-presented with fatigue/diarrhea/abdominal pain-found to have AKI and subsequently admitted to the hospitalist service.  Significant events: 9/9>> admit to [facility] 9/10>> Foley catheter placed-600 cc immediate urine output.  Significant studies: 9/9>> CT abdomen/pelvis: Mild small bowel dilatation, diverticulosis without diverticulitis  Significant microbiology data: 9/10>> stool C. difficile antigen: Positive 9/10>> GI pathogen panel: EPEC positive  Procedures: None  Consults: GI  Subjective: Slightly short of breath-tachycardic-no pain-diarrhea has improved.  Objective: Vitals: Blood pressure (!) 115/55, pulse (!) 110, temperature 98.5 F (36.9 C), temperature source Oral, resp. rate 15, height 5\' 3"  (1.6 m), weight 48.4 kg, SpO2 97%.   Exam: Gen Exam:Alert awake-not in any distress HEENT:atraumatic, normocephalic Chest: Some scattered wheezing CVS:S1S2 regular-tachycardic Abdomen:soft non tender, non distended Extremities:no edema Neurology: Non focal Skin: no rash  Pertinent Labs/Radiology:    Latest Ref Rng & Units 07/11/2023    6:33 AM 07/10/2023    2:48 AM 07/09/2023    3:50 PM  CBC  WBC 4.0 - 10.5 K/uL 2.9  7.0  8.2   Hemoglobin 13.0 - 17.0 g/dL 56.4  9.3  33.2   Hematocrit 39.0 - 52.0 % 31.5  28.2  33.3   Platelets 150 - 400 K/uL 226  220  228     Lab Results  Component Value Date   NA 138 07/11/2023   K 4.5 07/11/2023   CL 112 (H) 07/11/2023   CO2 10  (L) 07/11/2023      Assessment/Plan: AKI Multifactorial etiology-volume loss due to diarrhea, ARB use, NSAID use and  acute urinary retention UA neg for proteinuria, CT abdomen negative for hydronephrosis Improving with supportive care and Foley catheter placement Continue Foley/Flomax Avoid nephrotoxic agents Follow electrolytes closely  Hyponatremia Secondary to diarrhea/volume loss Improved with IV fluid hydration  Non Anion gap metabolic acidosis Due to AKI/diarrhea Abdominal exam benign Oral bicarb Hopefully will improve with improvement in renal function/diarrhea  Sinus tachycardia Some mild wheezing today-little dyspneic as well but overall comfortable Starting bronchodilators/home Cardizem Echo/bilateral lower extremity Dopplers If no improvement-further workup Telemetry monitoring  Gastroenteritis likely due to enteropathogenic E. coli Doubt C. difficile as stool studies positive for enteropathogenic E. Coli Will stop oral vancomycin-stool studies likely represents colonization Start Zithromax x 3 days  History of CAD s/p CABG 1996 History of PAD No angina symptoms Aspirin/statin  HTN BP soft-on midodrine All antihypertensives held initially-due to sinus tachycardia-restarted on short acting Cardizem for now.  COPD Some wheezing this morning-also tachycardic Starting scheduled bronchodilators Do not think steroids are indicated at this point  Chronic hypoxic respiratory failure on home O2 History of lung cancer-s/p SBRT 2022  History of iron deficiency anemia with FDG avidity in the ascending colon on recent PET scan Suspicion for colon cancer-however per prior notes-not a candidate for endoscopic evaluation due to advanced COPD GI apparently offered inpatient colonoscopy-patient refused Hemoglobin stable-prior history of  FOBT positive but currently not actively/overtly bleeding  Nutrition Status: Nutrition Problem: Severe Malnutrition Etiology:  chronic illness Signs/Symptoms: severe fat depletion, severe muscle depletion Interventions: Ensure Enlive (each supplement provides 350kcal and 20 grams of protein), Magic cup    Underweight: Estimated body mass index is 18.9 kg/m as calculated from the following:   Height as of this encounter: 5\' 3"  (1.6 m).   Weight as of this encounter: 48.4 kg.   Code status:   Code Status: Full Code   DVT Prophylaxis: heparin injection 5,000 Units Start: 07/10/23 1415 SCDs Start: 07/09/23 1857   Family Communication: None at bedside   Disposition Plan: Status is: Inpatient Remains inpatient appropriate because: Severity of illness   Planned Discharge Destination:Home health   Diet: Diet Order             DIET DYS 3 Room service appropriate? Yes with Assist; Fluid consistency: Thin  Diet effective now                     Antimicrobial agents: Anti-infectives (From admission, onward)    Start     Dose/Rate Route Frequency Ordered Stop   07/11/23 1000  azithromycin (ZITHROMAX) tablet 500 mg        500 mg Oral Daily 07/11/23 0645 07/14/23 0959   07/10/23 2200  fidaxomicin (DIFICID) tablet 200 mg  Status:  Discontinued        200 mg Oral 2 times daily 07/10/23 1610 07/10/23 1613   07/10/23 2200  vancomycin (VANCOCIN) capsule 125 mg  Status:  Discontinued        125 mg Oral 4 times daily 07/10/23 1613 07/11/23 1117   07/10/23 1400  vancomycin (VANCOCIN) capsule 125 mg  Status:  Discontinued        125 mg Oral 4 times daily 07/10/23 1312 07/10/23 1609        MEDICATIONS: Scheduled Meds:  arformoterol  15 mcg Nebulization BID   aspirin EC  81 mg Oral Daily   atorvastatin  20 mg Oral Daily   azithromycin  500 mg Oral Daily   budesonide (PULMICORT) nebulizer solution  0.25 mg Nebulization BID   Chlorhexidine Gluconate Cloth  6 each Topical Daily   cholecalciferol  2,000 Units Oral Daily   diltiazem  30 mg Oral Q8H   [START ON 07/12/2023] feeding supplement  237 mL Oral  BID BM   gabapentin  300 mg Oral BID   heparin injection (subcutaneous)  5,000 Units Subcutaneous Q8H   midodrine  10 mg Oral TID WC   ondansetron (ZOFRAN) IV  4 mg Intravenous Once   pantoprazole  40 mg Oral Daily   revefenacin  175 mcg Nebulization Daily   sodium bicarbonate  650 mg Oral BID   tamsulosin  0.4 mg Oral QPC supper   Continuous Infusions: PRN Meds:.acetaminophen, bismuth subsalicylate, diphenoxylate-atropine, iohexol, levalbuterol, ondansetron **OR** ondansetron (ZOFRAN) IV, traMADol   I have personally reviewed following labs and imaging studies  LABORATORY DATA: CBC: Recent Labs  Lab 07/09/23 1550 07/10/23 0248 07/11/23 0633  WBC 8.2 7.0 2.9*  NEUTROABS 7.0  --   --   HGB 10.7* 9.3* 10.1*  HCT 33.3* 28.2* 31.5*  MCV 95.4 93.1 95.5  PLT 228 220 226    Basic Metabolic Panel: Recent Labs  Lab 07/09/23 1550 07/10/23 0248 07/11/23 0633  NA 128* 133* 138  K 4.9 4.5 4.5  CL 102 106 112*  CO2 14* 15* 10*  GLUCOSE 101* 97 67*  BUN  92* 95* 84*  CREATININE 7.70* 6.10* 2.93*  CALCIUM 6.6* 6.9* 7.5*    GFR: Estimated Creatinine Clearance: 14.2 mL/min (A) (by C-G formula based on SCr of 2.93 mg/dL (H)).  Liver Function Tests: Recent Labs  Lab 07/09/23 1550  AST 14*  ALT 13  ALKPHOS 55  BILITOT 0.8  PROT 5.2*  ALBUMIN 2.5*   Recent Labs  Lab 07/09/23 1550  LIPASE 17   No results for input(s): "AMMONIA" in the last 168 hours.  Coagulation Profile: No results for input(s): "INR", "PROTIME" in the last 168 hours.  Cardiac Enzymes: No results for input(s): "CKTOTAL", "CKMB", "CKMBINDEX", "TROPONINI" in the last 168 hours.  BNP (last 3 results) No results for input(s): "PROBNP" in the last 8760 hours.  Lipid Profile: No results for input(s): "CHOL", "HDL", "LDLCALC", "TRIG", "CHOLHDL", "LDLDIRECT" in the last 72 hours.  Thyroid Function Tests: Recent Labs    07/10/23 0248  TSH 1.351    Anemia Panel: No results for input(s):  "VITAMINB12", "FOLATE", "FERRITIN", "TIBC", "IRON", "RETICCTPCT" in the last 72 hours.  Urine analysis:    Component Value Date/Time   COLORURINE YELLOW 07/09/2023 1823   APPEARANCEUR HAZY (A) 07/09/2023 1823   LABSPEC 1.027 07/09/2023 1823   PHURINE 5.0 07/09/2023 1823   GLUCOSEU NEGATIVE 07/09/2023 1823   HGBUR SMALL (A) 07/09/2023 1823   BILIRUBINUR NEGATIVE 07/09/2023 1823   KETONESUR NEGATIVE 07/09/2023 1823   PROTEINUR 30 (A) 07/09/2023 1823   NITRITE NEGATIVE 07/09/2023 1823   LEUKOCYTESUR NEGATIVE 07/09/2023 1823    Sepsis Labs: Lactic Acid, Venous No results found for: "LATICACIDVEN"  MICROBIOLOGY: Recent Results (from the past 240 hour(s))  Gastrointestinal Panel by PCR , Stool     Status: Abnormal   Collection Time: 07/10/23  6:46 AM   Specimen: STOOL  Result Value Ref Range Status   Campylobacter species NOT DETECTED NOT DETECTED Final   Plesimonas shigelloides NOT DETECTED NOT DETECTED Final   Salmonella species NOT DETECTED NOT DETECTED Final   Yersinia enterocolitica NOT DETECTED NOT DETECTED Final   Vibrio species NOT DETECTED NOT DETECTED Final   Vibrio cholerae NOT DETECTED NOT DETECTED Final   Enteroaggregative E coli (EAEC) NOT DETECTED NOT DETECTED Final   Enteropathogenic E coli (EPEC) DETECTED (A) NOT DETECTED Final    Comment: RESULT CALLED TO, READ BACK BY AND VERIFIED WITH: DAWN MELAND RN @0439  07/11/23 ASW    Enterotoxigenic E coli (ETEC) NOT DETECTED NOT DETECTED Final   Shiga like toxin producing E coli (STEC) NOT DETECTED NOT DETECTED Final   Shigella/Enteroinvasive E coli (EIEC) NOT DETECTED NOT DETECTED Final   Cryptosporidium NOT DETECTED NOT DETECTED Final   Cyclospora cayetanensis NOT DETECTED NOT DETECTED Final   Entamoeba histolytica NOT DETECTED NOT DETECTED Final   Giardia lamblia NOT DETECTED NOT DETECTED Final   Adenovirus F40/41 NOT DETECTED NOT DETECTED Final   Astrovirus NOT DETECTED NOT DETECTED Final   Norovirus GI/GII NOT  DETECTED NOT DETECTED Final   Rotavirus A NOT DETECTED NOT DETECTED Final   Sapovirus (I, II, IV, and V) NOT DETECTED NOT DETECTED Final    Comment: Performed at [facility], [address] [place], [place] [address]  C Difficile Quick Screen w PCR reflex     Status: Abnormal   Collection Time: 07/10/23  6:46 AM   Specimen: STOOL  Result Value Ref Range Status   C Diff antigen POSITIVE (A) NEGATIVE Final   C Diff toxin NEGATIVE NEGATIVE Final   C Diff interpretation  Final    Results are indeterminate. Please contact the provider listed for your campus for C diff questions in AMION.    Comment: Performed at [facility], [address] [place], [place] [address]  C. Diff by PCR, Reflexed     Status: None   Collection Time: 07/10/23  6:46 AM  Result Value Ref Range Status   Toxigenic C. Difficile by PCR NEGATIVE NEGATIVE Final    Comment: Patient is colonized with non toxigenic C. difficile. May not need treatment unless significant symptoms are present. Performed at [facility], [address] [place], [place] [address]     RADIOLOGY STUDIES/RESULTS: VAS [place] LOWER EXTREMITY VENOUS (DVT)  Result Date: 07/11/2023  Lower Venous DVT Study Patient Name:  DEVEN NIMTZ  Date of Exam:   07/11/2023 Medical Rec #: [id number]       Accession #:    [id number] Date of Birth: 04-22-1946        Patient Gender: M Patient Age:   82 years Exam Location:  [facility] Procedure:      VAS [place] LOWER EXTREMITY VENOUS (DVT) Referring Phys: Jeoffrey Massed --------------------------------------------------------------------------------  Indications: Swelling, and Edema.  Risk Factors: None identified. Comparison Study: No prior study Performing Technologist: Shona Simpson  Examination Guidelines: A complete evaluation includes B-mode imaging, spectral Doppler, color Doppler, and power Doppler as needed of all accessible portions of each vessel. Bilateral testing is considered an  integral part of a complete examination. Limited examinations for reoccurring indications may be performed as noted. The reflux portion of the exam is performed with the patient in reverse Trendelenburg.  +---------+---------------+---------+-----------+----------+--------------+ RIGHT    CompressibilityPhasicitySpontaneityPropertiesThrombus Aging +---------+---------------+---------+-----------+----------+--------------+ CFV      Full           Yes      Yes                                 +---------+---------------+---------+-----------+----------+--------------+ SFJ      Full                                                        +---------+---------------+---------+-----------+----------+--------------+ FV Prox  Full                                                        +---------+---------------+---------+-----------+----------+--------------+ FV Mid   Full                                                        +---------+---------------+---------+-----------+----------+--------------+ FV DistalFull                                                        +---------+---------------+---------+-----------+----------+--------------+ PFV      Full                                                        +---------+---------------+---------+-----------+----------+--------------+  POP      Full           Yes      Yes                                 +---------+---------------+---------+-----------+----------+--------------+ PTV      Full                                                        +---------+---------------+---------+-----------+----------+--------------+ PERO     Full                                                        +---------+---------------+---------+-----------+----------+--------------+   +---------+---------------+---------+-----------+----------+--------------+ LEFT     CompressibilityPhasicitySpontaneityPropertiesThrombus  Aging +---------+---------------+---------+-----------+----------+--------------+ CFV      Full           Yes      Yes                                 +---------+---------------+---------+-----------+----------+--------------+ SFJ      Full                                                        +---------+---------------+---------+-----------+----------+--------------+ FV Prox  Full                                                        +---------+---------------+---------+-----------+----------+--------------+ FV Mid   Full                                                        +---------+---------------+---------+-----------+----------+--------------+ FV DistalFull                                                        +---------+---------------+---------+-----------+----------+--------------+ PFV      Full                                                        +---------+---------------+---------+-----------+----------+--------------+ POP      Full           Yes      Yes                                 +---------+---------------+---------+-----------+----------+--------------+  PTV      Full                                                        +---------+---------------+---------+-----------+----------+--------------+ PERO     Full                                                        +---------+---------------+---------+-----------+----------+--------------+     Summary: BILATERAL: - No evidence of deep vein thrombosis seen in the lower extremities, bilaterally. -No evidence of popliteal cyst, bilaterally.   *See table(s) above for measurements and observations.    Preliminary    DG Chest Port 1V same Day  Result Date: 07/11/2023 CLINICAL DATA:  Shortness of breath EXAM: PORTABLE CHEST 1 VIEW COMPARISON:  CXR 01/13/19 FINDINGS: Status post median sternotomy. No pleural effusion. No pneumothorax. Bibasilar atelectasis. Normal cardiac and  mediastinal contours. No radiographically apparent displaced rib fractures. There is a linear opacity in the left upper lobe that is new compared to prior exam and may represent postsurgical change. There is a hazy right infrahilar airspace opacities that could represent atelectasis or infection. IMPRESSION: 1. Hazy right infrahilar airspace opacity could represent atelectasis or infection. 2. Linear opacity in the left upper lobe is new compared to prior exam and may represent postsurgical change. Electronically Signed   By: Lorenza Cambridge M.D.   On: 07/11/2023 08:31     LOS: 2 days   Jeoffrey Massed, MD  [facility]    To contact the attending provider between 7A-7P or the covering provider during after hours 7P-7A, please log into the web site [email or web address] and access using universal [place] password for that web site. If you do not have the password, please call the hospital operator.  07/11/2023, 5:38 PM

## 2023-07-11 NOTE — TOC Initial Note (Signed)
Transition of Care [facility]) - Initial/Assessment Note    Patient Details  Name: William Lee MRN: [id number] Date of Birth: 1946-02-07  Transition of Care [facility] CM/SW Contact:    Lawerance Sabal, RN Phone Number: 07/11/2023, 8:29 AM  Clinical Narrative:                  Spoke w patient's wife over the phone to discuss DC needs.  Patient will need RW, this has been ordered through [employer] to be brought to the room. Patient recommended for [facility] therapies. Wife is agreeable to [facility] therapies. She acknowledges patient's cancer diagnosis while we talk, and speaks of how she cared for her mother during her death and managed without palliative or hospice care. She is not interested to speak of, or acknowledge, how palliative services may be beneficial to her or the patient. She is agreeable to [facility] services, and deferred choice to CM.  Referral has been accepted by [facility] and [facility]).  Discussed case w liaison that patient will need HH RN PT OT, and case may progress into needing palliative and hospice services at a later date if the wife is agreeable after developing therapeutic relationships with care givers.  Liaison has spoken with wife and accepted case.  (If case progresses to hospice prior to DC, [facility] will follow for hospice)  Please place order for [facility] PT OT RN with face to face     Expected Discharge Plan: Home w Home Health Services Barriers to Discharge: Continued Medical Work up   Patient Goals and CMS Choice Patient states their goals for this hospitalization and ongoing recovery are:: return home CMS Medicare.gov Compare Post Acute Care list provided to:: Other (Comment Required) Choice offered to / list presented to : Spouse      Expected Discharge Plan and Services   Discharge Planning Services: CM Consult Post Acute Care Choice: Home Health, Durable Medical Equipment Living arrangements for the past 2 months: Single Family Home                            HH Arranged: RN, PT, OT, Disease Management HH Agency: [facility] Date [facility] Agency Contacted: 07/11/23 Time HH Agency Contacted: [phone] Representative spoke with at [facility] Agency: [place] [facility]  Prior Living Arrangements/Services Living arrangements for the past 2 months: Single Family Home Lives with:: Spouse   Do you feel safe going back to the place where you live?: Yes          Current home services: DME    Activities of Daily Living Home Assistive Devices/Equipment: Oxygen, Walker (specify type) ADL Screening (condition at time of admission) Patient's cognitive ability adequate to safely complete daily activities?: Yes Is the patient deaf or have difficulty hearing?: Yes Does the patient have difficulty seeing, even when wearing glasses/contacts?: No Does the patient have difficulty concentrating, remembering, or making decisions?: No Patient able to express need for assistance with ADLs?: Yes Does the patient have difficulty dressing or bathing?: Yes Independently performs ADLs?: Yes (appropriate for developmental age) Does the patient have difficulty walking or climbing stairs?: Yes Weakness of Legs: Both Weakness of Arms/Hands: Both  Permission Sought/Granted                  Emotional Assessment              Admission diagnosis:  Hypocalcemia [E83.51] Hyponatremia [E87.1] Generalized abdominal pain [R10.84] Acute renal  failure (ARF) (HCC) [N17.9] AKI (acute kidney injury) (HCC) [N17.9] Colonic mass [K63.89] Anemia, unspecified type [D64.9] Diarrhea, unspecified type [R19.7] Patient Active Problem List   Diagnosis Date Noted   Acute renal failure (ARF) (HCC) 07/09/2023   Hypoxemic respiratory failure, chronic (HCC) 06/08/2023   Iron deficiency anemia 04/02/2023   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 [place] [address] Phone: [phone] Fax: [phone]     Social Determinants of Health (SDOH) Social History: SDOH Screenings   Food Insecurity: No Food Insecurity (07/09/2023)  Housing: Patient Declined (07/09/2023)  Transportation Needs: No Transportation Needs (07/09/2023)  Utilities: Not At Risk (07/09/2023)  Depression (PHQ2-9): Low Risk  (12/02/2020)  Tobacco Use: Medium Risk (07/09/2023)   SDOH Interventions:     Readmission Risk Interventions     No data to display

## 2023-07-11 NOTE — Evaluation (Signed)
Clinical/Bedside Swallow Evaluation Patient Details  Name: William Lee MRN: [id number] Date of Birth: 06/16/1946  Today's Date: 07/11/2023 Time: SLP Start Time (ACUTE ONLY): 1006 SLP Stop Time (ACUTE ONLY): 1023 SLP Time Calculation (min) (ACUTE ONLY): 17 min  Past Medical History:  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   bilateral hearing aids used   Hyperlipidemia    Hypertension    Ischemic heart disease    left Inguinal hernia 02/01/2012   Repaired 05/01/12    MI (myocardial infarction) (HCC)    Mixed hyperlipidemia 05/16/2017   Papilloma of larynx    PONV (postoperative nausea and vomiting)    Shortness of breath 11/11/2011   Oxygen @ 2L via [place] prn   SOB (shortness of breath) on exertion    Past Surgical History:  Past Surgical History:  Procedure Laterality Date   CARDIAC CATHETERIZATION     CARPAL TUNNEL RELEASE Right 06/10/2021   Procedure: Right Carpal Tunnel Release;  Surgeon: Maeola Harman, MD;  Location: [facility] OR;  Service: Neurosurgery;  Laterality: Right;  RM 21   CARPAL TUNNEL RELEASE Left 08/01/2021   Procedure: Left carpal tunnel release;  Surgeon: Maeola Harman, MD;  Location: [facility] OR;  Service: Neurosurgery;  Laterality: Left;   CHOLECYSTECTOMY  12/1995   COLONOSCOPY WITH PROPOFOL N/A 09/23/2013   Procedure: COLONOSCOPY WITH PROPOFOL;  Surgeon: Vertell Novak., MD;  Location: [facility];  Service: Endoscopy;  Laterality: N/A;   CORONARY ARTERY BYPASS GRAFT     ESOPHAGOGASTRODUODENOSCOPY (EGD) WITH PROPOFOL N/A 09/23/2013   Procedure:  ESOPHAGOGASTRODUODENOSCOPY (EGD) WITH PROPOFOL;  Surgeon: Vertell Novak., MD;  Location: [facility];  Service: Endoscopy;  Laterality: N/A;   HERNIA REPAIR     INGUINAL HERNIA REPAIR  05/01/2012   Procedure: HERNIA REPAIR INGUINAL ADULT;  Surgeon: Currie Paris, MD;  Location: [facility];  Service: General;  Laterality: Left;  inguinal   papalomas virus removal  3/10 7/10 5/11 10/12   PERIPHERAL VASCULAR CATHETERIZATION N/A 04/19/2016   Procedure: Abdominal Aortogram w/Lower Extremity;  Surgeon: Larina Earthly, MD;  Location: [facility] INVASIVE CV LAB;  Service: Cardiovascular;  Laterality: N/A;   HPI:  77 yo male presents to [facility] on 9/9 with abdominal pain, weakness. Workup for AKI, hypotension. PMH include lung cancer 2022 on 2LO2 all times, papilloma of the larynx, COPD, ischemic cardiomyopathy, EF equal 45% in 2019, chronic anemia and guaiac positive stools, colonic mass recently diagnosed.    Assessment / Plan / Recommendation  Clinical Impression  Pt exhibits a respiratory based dysphagia with possible esophageal invovlement. Upon arival pt coughing, wheezing with increased work of breathing. His vocal quality is somewhat hoarse and oral care provided due to discoloration of tongue (residue from meds). Orally it took him longer to masticate due to SOB and he has no upper dentition but does not wear dentures when eating. He had one delayed cough at the end of evaluation after thin liquids out of multiple sips. Frequent belching noted and pt affirms GERD. Discussed textures and  it was agreed upon that Dys 3 would help with energy conservation. Recommend continue thin liquids, sitting upright, pt educated to stay upright after meals and take rest breaks when needed and do not eat when short of breath. ST will follow. SLP Visit Diagnosis: Dysphagia, unspecified (R13.10)    Aspiration Risk  Mild aspiration risk    Diet Recommendation Dysphagia 3 (Mech soft);Thin liquid    Liquid  Administration via: Cup;Straw Medication Administration: Whole meds with puree Supervision: Patient able to self feed Compensations: Slow rate;Small sips/bites Postural Changes: Seated upright at 90 degrees;Remain upright for at least 30 minutes after po intake    Other  Recommendations Oral Care Recommendations: Oral care BID    Recommendations for follow up therapy are one component of a multi-disciplinary discharge planning process, led by the attending physician.  Recommendations may be updated based on patient status, additional functional criteria and insurance authorization.  Follow up Recommendations  (TBD)      Assistance Recommended at Discharge    Functional Status Assessment Patient has had a recent decline in their functional status and demonstrates the ability to make significant improvements in function in a reasonable and predictable amount of time.  Frequency and Duration min 2x/week  2 weeks       Prognosis Prognosis for improved oropharyngeal function: Good      Swallow Study   General Date of Onset: 07/09/23 HPI: 77 yo male [facility] with abdominal pain, weakness. Workup for AKI, hypotension. PMH include lung cancer 2022 on 2LO2 all times, papilloma of the larynx, COPD, ischemic cardiomyopathy, EF equal 45% in 2019, chronic anemia and guaiac positive stools, colonic mass recently diagnosed. Type of Study: Bedside Swallow Evaluation Previous Swallow Assessment:  (none) Diet Prior to this Study: Regular;Thin liquids (Level 0) Temperature Spikes Noted: No Respiratory Status: Nasal cannula History of Recent Intubation: No Behavior/Cognition: Alert;Cooperative;Pleasant mood Oral Cavity Assessment: Dry Oral Care Completed by SLP: Yes Oral Cavity - Dentition: Missing dentition (missing upper) Vision: Functional for self-feeding Self-Feeding Abilities: Able to feed self Patient Positioning: Upright in bed Baseline Vocal Quality: Hoarse  (wheeze) Volitional Cough: Strong Volitional Swallow: Able to elicit    Oral/Motor/Sensory Function Overall Oral Motor/Sensory Function: Within functional limits   Ice Chips Ice chips: Not tested   Thin Liquid Thin Liquid: Impaired Presentation: Straw Pharyngeal  Phase Impairments: Cough - Delayed    Nectar Thick Nectar Thick Liquid: Not tested   Honey Thick Honey Thick Liquid: Not tested   Puree Puree: Within functional limits   Solid     Solid: Impaired Oral Phase Functional Implications: Prolonged oral transit      Royce Macadamia 07/11/2023,10:49 AM

## 2023-07-11 NOTE — Progress Notes (Signed)
Lower extremity venous duplex completed. Please see CV Procedures for preliminary results.  Shona Simpson, RVT 07/11/23 3:36 PM

## 2023-07-11 NOTE — Progress Notes (Signed)
Initial Nutrition Assessment  DOCUMENTATION CODES:   Severe malnutrition in context of chronic illness  INTERVENTION:   Ensure Enlive po BID, each supplement provides 350 kcal and 20 grams of protein. Prefers vanilla or strawberry  Magic cup TID with meals, each supplement provides 290 kcal and 9 grams of protein  Encourage PO intake  Reviewed Menu options/ordering process    NUTRITION DIAGNOSIS:   Severe Malnutrition related to chronic illness as evidenced by severe fat depletion, severe muscle depletion.  GOAL:   Patient will meet greater than or equal to 90% of their needs  MONITOR:   PO intake, Supplement acceptance  REASON FOR ASSESSMENT:   Consult Assessment of nutrition requirement/status  ASSESSMENT:   Pt with PMH of lung cancer dx 2022 on 2L O2 via [place], papilloma of larynx, severe COPD, ischemic cardiomyopathy, EF 45%, chronic anemia, recently dx colonic mass now admitted with weakness, fatigue, abd pain, AKI and C diff + diarrhea.   Per GI notes, no plans for colonoscopy for now and will follow up as outpatient   Spoke with wife and niece who are at bedside. They report that pt's weight has been down to 97 lb, especially after XRT, but he has regained weight up to 108 lb. Wife has been giving pt ensure, premier protein, ice cream and offering whatever foods he would like to eat which has resulted in gaining some weight back.  Per wife plans are to d/c home hopefully tomorrow with hospice to make pt comfortable and allow him to eat home cooked meals which he prefers.     Medications reviewed and include: vitamin D3 2000 daily, protonix,   Labs reviewed:  Cr 7.70 -> 6.10 -> 2.93    NUTRITION - FOCUSED PHYSICAL EXAM:  Flowsheet Row Most Recent Value  Orbital Region Severe depletion  Upper Arm Region Severe depletion  Thoracic and Lumbar Region Severe depletion  Buccal Region Severe depletion  Temple Region Severe depletion  Clavicle Bone Region Severe  depletion  Clavicle and Acromion Bone Region Severe depletion  Scapular Bone Region Unable to assess  Dorsal Hand Severe depletion  Patellar Region Severe depletion  Anterior Thigh Region Severe depletion  Posterior Calf Region Severe depletion  Edema (RD Assessment) None  Hair Reviewed  Eyes Reviewed  Mouth Reviewed  [dry tongue]  Skin Reviewed  Nails Reviewed       Diet Order:   Diet Order             DIET DYS 3 Room service appropriate? Yes with Assist; Fluid consistency: Thin  Diet effective now                   EDUCATION NEEDS:   Education needs have been addressed  Skin:  Skin Assessment: Reviewed RN Assessment  Last BM:  9/10 x 2; medium; type 7 (Cdiff +)  Height:   Ht Readings from Last 1 Encounters:  07/09/23 5\' 3"  (1.6 m)    Weight:   Wt Readings from Last 1 Encounters:  07/09/23 48.4 kg    Ideal Body Weight:  56.3 kg  BMI:  Body mass index is 18.9 kg/m.  Estimated Nutritional Needs:   Kcal:  1500-1700  Protein:  75-90 grams  Fluid:  >1.5 L/day  Cammy Copa., RD, LDN, CNSC See AMiON for contact information

## 2023-07-11 NOTE — Evaluation (Signed)
Occupational Therapy Evaluation Patient Details Name: William Lee MRN: [id number] DOB: Apr 08, 1946 Today's Date: 07/11/2023   History of Present Illness 77 yo male presents to [facility] on 9/9 with abdominal pain, weakness. Workup for AKI, hypotension. PMH include lung cancer 2022 on 2LO2 all times, papilloma of the larynx, COPD, ischemic cardiomyopathy, EF equal 45% in 2019, chronic anemia and guaiac positive stools, colonic mass recently diagnosed.   Clinical Impression   Pt admitted with above, presents with generalized weakness and continuous SOB. Reinforced pursed lip breathing with pt that helped but shortly after his wheezing would return. Bp lower than his normal values, RN notified and Oob mobility was deferred for pt safety. Pt had a very challenging time donning socks, displays decreased activity tolerance based on assessment of ADLs and would benefit from energy conservation education. OT to continue to follow pt acutely to address deficits and help transition to next level of care. Patient would benefit from post acute Home OT services to help maximize functional independence in natural environment       If plan is discharge home, recommend the following: A little help with walking and/or transfers;A lot of help with bathing/dressing/bathroom;Assistance with cooking/housework;Assist for transportation;Help with stairs or ramp for entrance    Functional Status Assessment  Patient has had a recent decline in their functional status and demonstrates the ability to make significant improvements in function in a reasonable and predictable amount of time.  Equipment Recommendations  BSC/3in1    Recommendations for Other Services       Precautions / Restrictions Precautions Precautions: Fall Precaution Comments: 2LO2 chronically Restrictions Weight Bearing Restrictions: No      Mobility Bed Mobility Overal bed mobility: Needs Assistance Bed Mobility: Supine to Sit, Sit to Supine      Supine to sit: Contact guard Sit to supine: Contact guard assist   General bed mobility comments: increased time and effort    Transfers                   General transfer comment: deferred due to low BP      Balance Overall balance assessment: Needs assistance Sitting-balance support: No upper extremity supported, Feet supported Sitting balance-Leahy Scale: Fair Sitting balance - Comments: LOB on bed when attempting to don socks                                   ADL either performed or assessed with clinical judgement   ADL Overall ADL's : Needs assistance/impaired Eating/Feeding: Bed level;Independent   Grooming: Sitting;Contact guard assist   Upper Body Bathing: Sitting;Minimal assistance Upper Body Bathing Details (indicate cue type and reason): Min A to maintain balance Lower Body Bathing: Moderate assistance;Sitting/lateral leans   Upper Body Dressing : Sitting;Contact guard assist   Lower Body Dressing: Maximal assistance;Sitting/lateral leans Lower Body Dressing Details (indicate cue type and reason): donning socks,   Toilet Transfer Details (indicate cue type and reason): deferred due to low BP           General ADL Comments: Educated pt on pursed lip breathing to help with SOB, at times it would help with reducing pt's wheezing.     Vision         Perception         Praxis         Pertinent Vitals/Pain Pain Assessment Pain Assessment: Faces Faces Pain Scale: Hurts little more Pain Location: left  foot Pain Descriptors / Indicators: Discomfort, Aching Pain Intervention(s): Monitored during session, Repositioned, Ice applied     Extremity/Trunk Assessment Upper Extremity Assessment Upper Extremity Assessment: Generalized weakness (UE tremors, ROM WFL)   Lower Extremity Assessment Lower Extremity Assessment: Generalized weakness;LLE deficits/detail (numbness along upper 1/3 plantar aspect of bilat feet.) LLE  Deficits / Details: L foot swelling   Cervical / Trunk Assessment Cervical / Trunk Assessment: Normal   Communication Communication Communication: Other (comment) (hoarse, soft spoken) Cueing Techniques: Verbal cues;Gestural cues   Cognition Arousal: Alert Behavior During Therapy: WFL for tasks assessed/performed Overall Cognitive Status: Within Functional Limits for tasks assessed                                       General Comments  BP noted to be 82/41 in supine and 81/38 sitting EOB, based on recent vitals BP noted to be downtrending. RN notified of pt's Bp. L foot elevated on linens and applied ice to help reduce swelling.    Exercises     Shoulder Instructions      Home Living Family/patient expects to be discharged to:: Private residence Living Arrangements: Spouse/significant other Available Help at Discharge: Family Type of Home: House Home Access: Ramped entrance     Home Layout: One level     Bathroom Shower/Tub: Producer, television/film/video: Standard     Home Equipment: Wheelchair - manual;Cane - single point;Shower seat;Grab bars - tub/shower          Prior Functioning/Environment Prior Level of Function : Independent/Modified Independent             Mobility Comments: pt reports no AD use normally, endorses no falls ADLs Comments: ind        OT Problem List: Decreased strength;Other (comment);Impaired balance (sitting and/or standing);Decreased activity tolerance (SOB)      OT Treatment/Interventions: Self-care/ADL training;Balance training;Therapeutic exercise;Therapeutic activities;Patient/family education;Energy conservation    OT Goals(Current goals can be found in the care plan section) Acute Rehab OT Goals Patient Stated Goal: To get better OT Goal Formulation: With patient Time For Goal Achievement: 07/25/23 Potential to Achieve Goals: Fair ADL Goals Pt Will Perform Grooming: standing;with supervision Pt  Will Perform Lower Body Bathing: with supervision;with set-up;sitting/lateral leans Pt Will Perform Lower Body Dressing: with contact guard assist;sit to/from stand Pt Will Transfer to Toilet: stand pivot transfer;with supervision;bedside commode  OT Frequency: Min 1X/week    Co-evaluation              AM-PAC OT "6 Clicks" Daily Activity     Outcome Measure Help from another person eating meals?: None Help from another person taking care of personal grooming?: A Little Help from another person toileting, which includes using toliet, bedpan, or urinal?: A Lot Help from another person bathing (including washing, rinsing, drying)?: A Lot Help from another person to put on and taking off regular upper body clothing?: A Little Help from another person to put on and taking off regular lower body clothing?: A Lot 6 Click Score: 16   End of Session Equipment Utilized During Treatment: Oxygen (2L) Nurse Communication: Mobility status  Activity Tolerance: Treatment limited secondary to medical complications (Comment) (Low BP; deferred OOB mobility for safety) Patient left: in bed;with call bell/phone within reach;with bed alarm set  OT Visit Diagnosis: Unsteadiness on feet (R26.81);Muscle weakness (generalized) (M62.81);Other (comment) (SOB)  Time: 1610-9604 OT Time Calculation (min): 30 min Charges:  OT General Charges $OT Visit: 1 Visit OT Evaluation $OT Eval Moderate Complexity: 1 Mod OT Treatments $Therapeutic Activity: 8-22 mins  07/11/2023  AB, OTR/L  Acute Rehabilitation Services  Office: [phone]   Tristan Schroeder 07/11/2023, 5:53 PM

## 2023-07-11 NOTE — Plan of Care (Signed)
Pt has rested quietly throughout the night with no distress noted. Alert and oriented. O22LNC. Foley cath intact to BSD. No SOA noted. Pt had one BM. Denies pain. No complaints voiced.      Problem: Education: Goal: Knowledge of General Education information will improve Description: Including pain rating scale, medication(s)/side effects and non-pharmacologic comfort measures Outcome: Progressing   Problem: Health Behavior/Discharge Planning: Goal: Ability to manage health-related needs will improve Outcome: Progressing   Problem: Clinical Measurements: Goal: Ability to maintain clinical measurements within normal limits will improve Outcome: Progressing Goal: Will remain free from infection Outcome: Progressing Goal: Diagnostic test results will improve Outcome: Progressing Goal: Respiratory complications will improve Outcome: Progressing Goal: Cardiovascular complication will be avoided Outcome: Progressing   Problem: Activity: Goal: Risk for activity intolerance will decrease Outcome: Progressing   Problem: Nutrition: Goal: Adequate nutrition will be maintained Outcome: Progressing   Problem: Coping: Goal: Level of anxiety will decrease Outcome: Progressing   Problem: Elimination: Goal: Will not experience complications related to bowel motility Outcome: Progressing Goal: Will not experience complications related to urinary retention Outcome: Progressing   Problem: Pain Managment: Goal: General experience of comfort will improve Outcome: Progressing   Problem: Safety: Goal: Ability to remain free from injury will improve Outcome: Progressing   Problem: Skin Integrity: Goal: Risk for impaired skin integrity will decrease Outcome: Progressing

## 2023-07-11 NOTE — TOC Benefit Eligibility Note (Signed)
Patient Product/process development scientist completed.    The patient is insured through [employer]. Patient has Medicare and is not eligible for a copay card, but may be able to apply for patient assistance, if available.    Ran test claim for vancomycin 125 mg capsules and the current 10 day co-pay is $10.00.   This test claim was processed through [facility]- copay amounts may vary at other pharmacies due to pharmacy/plan contracts, or as the patient moves through the different stages of their insurance plan.     Roland Earl, CPHT Pharmacy Technician III Certified Patient Advocate [facility] Patient Advocate Team Direct Number: [phone]  Fax: [phone]

## 2023-07-12 ENCOUNTER — Inpatient Hospital Stay (HOSPITAL_COMMUNITY): Payer: Medicare Other

## 2023-07-12 DIAGNOSIS — I428 Other cardiomyopathies: Secondary | ICD-10-CM

## 2023-07-12 DIAGNOSIS — I70229 Atherosclerosis of native arteries of extremities with rest pain, unspecified extremity: Secondary | ICD-10-CM | POA: Diagnosis not present

## 2023-07-12 DIAGNOSIS — K6389 Other specified diseases of intestine: Secondary | ICD-10-CM | POA: Diagnosis not present

## 2023-07-12 DIAGNOSIS — D649 Anemia, unspecified: Secondary | ICD-10-CM | POA: Diagnosis not present

## 2023-07-12 DIAGNOSIS — R197 Diarrhea, unspecified: Secondary | ICD-10-CM | POA: Diagnosis not present

## 2023-07-12 DIAGNOSIS — N179 Acute kidney failure, unspecified: Secondary | ICD-10-CM | POA: Diagnosis not present

## 2023-07-12 LAB — ECHOCARDIOGRAM COMPLETE
Area-P 1/2: 5.58 cm2
Height: 63 in
S' Lateral: 3.15 cm
Weight: 1707.24 [oz_av]

## 2023-07-12 LAB — VAS US ABI WITH/WO TBI: Right ABI: 1.03

## 2023-07-12 LAB — BASIC METABOLIC PANEL
Anion gap: 17 — ABNORMAL HIGH (ref 5–15)
BUN: 96 mg/dL — ABNORMAL HIGH (ref 8–23)
CO2: 12 mmol/L — ABNORMAL LOW (ref 22–32)
Calcium: 7.8 mg/dL — ABNORMAL LOW (ref 8.9–10.3)
Chloride: 109 mmol/L (ref 98–111)
Creatinine, Ser: 3.2 mg/dL — ABNORMAL HIGH (ref 0.61–1.24)
GFR, Estimated: 19 mL/min — ABNORMAL LOW (ref >60)
Glucose, Bld: 87 mg/dL (ref 70–99)
Potassium: 4.5 mmol/L (ref 3.5–5.1)
Sodium: 138 mmol/L (ref 135–145)

## 2023-07-12 LAB — CBC
HCT: 30.1 % — ABNORMAL LOW (ref 39.0–52.0)
Hemoglobin: 9.5 g/dL — ABNORMAL LOW (ref 13.0–17.0)
MCH: 30.9 pg (ref 26.0–34.0)
MCHC: 31.6 g/dL (ref 30.0–36.0)
MCV: 98 fL (ref 80.0–100.0)
Platelets: 231 10*3/uL (ref 150–400)
RBC: 3.07 MIL/uL — ABNORMAL LOW (ref 4.22–5.81)
RDW: 15.1 % (ref 11.5–15.5)
WBC: 7.3 10*3/uL (ref 4.0–10.5)
nRBC: 0 % (ref 0.0–0.2)

## 2023-07-12 LAB — BRAIN NATRIURETIC PEPTIDE: B Natriuretic Peptide: 1894.7 pg/mL — ABNORMAL HIGH (ref 0.0–100.0)

## 2023-07-12 MED ORDER — SODIUM BICARBONATE 650 MG PO TABS
1300.0000 mg | ORAL_TABLET | Freq: Two times a day (BID) | ORAL | Status: DC
  Administered 2023-07-12 – 2023-07-13 (×2): 1300 mg via ORAL
  Filled 2023-07-12 (×2): qty 2

## 2023-07-12 MED ORDER — HYDROMORPHONE HCL 2 MG PO TABS
1.0000 mg | ORAL_TABLET | Freq: Four times a day (QID) | ORAL | Status: DC | PRN
  Filled 2023-07-12: qty 1

## 2023-07-12 MED ORDER — LEVALBUTEROL HCL 0.63 MG/3ML IN NEBU
0.6300 mg | INHALATION_SOLUTION | Freq: Four times a day (QID) | RESPIRATORY_TRACT | Status: DC
  Administered 2023-07-12 – 2023-07-13 (×3): 0.63 mg via RESPIRATORY_TRACT
  Filled 2023-07-12 (×3): qty 3

## 2023-07-12 MED ORDER — HYDROMORPHONE HCL 2 MG PO TABS
1.0000 mg | ORAL_TABLET | Freq: Four times a day (QID) | ORAL | Status: DC | PRN
  Administered 2023-07-12 – 2023-07-13 (×2): 2 mg via ORAL
  Filled 2023-07-12 (×2): qty 1

## 2023-07-12 MED ORDER — IPRATROPIUM BROMIDE 0.02 % IN SOLN
0.5000 mg | Freq: Three times a day (TID) | RESPIRATORY_TRACT | Status: DC
  Administered 2023-07-12 (×2): 0.5 mg via RESPIRATORY_TRACT
  Filled 2023-07-12 (×2): qty 2.5

## 2023-07-12 MED ORDER — IPRATROPIUM BROMIDE 0.02 % IN SOLN
0.5000 mg | Freq: Four times a day (QID) | RESPIRATORY_TRACT | Status: DC
  Administered 2023-07-12 – 2023-07-13 (×3): 0.5 mg via RESPIRATORY_TRACT
  Filled 2023-07-12 (×3): qty 2.5

## 2023-07-12 MED ORDER — LEVALBUTEROL HCL 0.63 MG/3ML IN NEBU: 0.6300 mg | INHALATION_SOLUTION | Freq: Three times a day (TID) | RESPIRATORY_TRACT | Status: DC | PRN

## 2023-07-12 MED ORDER — METHYLPREDNISOLONE SODIUM SUCC 40 MG IJ SOLR
40.0000 mg | Freq: Two times a day (BID) | INTRAMUSCULAR | Status: DC
  Administered 2023-07-12 – 2023-07-13 (×3): 40 mg via INTRAVENOUS
  Filled 2023-07-12 (×3): qty 1

## 2023-07-12 MED ORDER — LEVALBUTEROL HCL 0.63 MG/3ML IN NEBU
0.6300 mg | INHALATION_SOLUTION | Freq: Three times a day (TID) | RESPIRATORY_TRACT | Status: DC
  Administered 2023-07-12 (×2): 0.63 mg via RESPIRATORY_TRACT
  Filled 2023-07-12 (×2): qty 3

## 2023-07-12 NOTE — Progress Notes (Signed)
*  PRELIMINARY RESULTS* Echocardiogram 2D Echocardiogram has been performed.  William Lee 07/12/2023, 10:52 AM

## 2023-07-12 NOTE — Progress Notes (Addendum)
PROGRESS NOTE        PATIENT DETAILS Name: William Lee Age: 77 y.o. Sex: male Date of Birth: 07-08-1946 Admit Date: 07/09/2023 Admitting Physician Buena Irish, MD ZOX:[id number], Curly Rim, MD  Brief Summary: Patient is a 77 y.o.  male with history of COPD on home O2, lung cancer-s/p SBRT August 2022, iron deficiency anemia-PET/CT at [facility] May 2022 with focus of FDG avidity in the ascending colon (felt to be a poor candidate for endoscopic procedures due to advanced COPD)-presented with fatigue/diarrhea/abdominal pain-found to have AKI and subsequently admitted to the hospitalist service.  Significant events: 9/9>> admit to [facility] 9/10>> Foley catheter placed-600 cc immediate urine output.  Significant studies: 9/9>> CT abdomen/pelvis: Mild small bowel dilatation, diverticulosis without diverticulitis  Significant microbiology data: 9/10>> stool C. difficile antigen: Positive 9/10>> GI pathogen panel: EPEC positive  Procedures: None  Consults: GI  Subjective: More short of breath today-more wheezing.  Complaining of worsening LLE pain overnight.  He really wants to go home-understands that he may be at a dead end in terms of further improvement-wants to talk to his wife about hospice care etc  Objective: Vitals: Blood pressure (!) 113/47, pulse 99, temperature 97.8 F (36.6 C), temperature source Oral, resp. rate 17, height 5\' 3"  (1.6 m), weight 48.4 kg, SpO2 98%.   Exam: Gen Exam: Slightly dyspneic-but awake/alert.   HEENT:atraumatic, normocephalic Chest: B/L clear to auscultation anteriorly CVS:S1S2 regular Abdomen:soft non tender, non distended Extremities: LLE-more swollen than RLE, both forefoot appears slightly cold, some skin discoloration LLE.  Not able to dorsalis pedis palpate pulses bilaterally neurology: Non focal Skin: no rash  Pertinent Labs/Radiology:    Latest Ref Rng & Units 07/12/2023    2:50 AM 07/11/2023    6:33 AM  07/10/2023    2:48 AM  CBC  WBC 4.0 - 10.5 K/uL 7.3  2.9  7.0   Hemoglobin 13.0 - 17.0 g/dL 9.5  40.9  9.3   Hematocrit 39.0 - 52.0 % 30.1  31.5  28.2   Platelets 150 - 400 K/uL 231  226  220     Lab Results  Component Value Date   NA 138 07/12/2023   K 4.5 07/12/2023   CL 109 07/12/2023   CO2 12 (L) 07/12/2023      Assessment/Plan: AKI Multifactorial etiology-volume loss due to diarrhea, ARB use, NSAID use and  acute urinary retention UA neg for proteinuria, CT abdomen negative for hydronephrosis Improved with IVF/Foley catheter placement-but creatinine levels have now plateaued Continue supportive care-continue to avoid nephrotoxic agents Currently being monitored off IVF-he received IVF for several days-he now appears relatively euvolemic  Hyponatremia Secondary to diarrhea/volume loss Improved with IV fluid hydration  Non Anion gap metabolic acidosis Due to AKI/diarrhea Abdominal exam benign Continues to have persistent metabolic acidosis Continue oral bicarb-will increase dosage Hopefully will improve with essentially no further diarrhea.  Sinus tachycardia Likely physiological response to pain in LLE/COPD exacerbation  Tachycardia much better today  Lower extremity Dopplers negative Echo pending  Gastroenteritis likely due to enteropathogenic E. coli Doubt C. difficile as stool studies positive for enteropathogenic E. Coli Will stop oral vancomycin-stool studies likely represents colonization Start Zithromax x 3 days  COPD exacerbation Worsening apnea this morning-gross rhonchi all over Starting IV steroids Scheduled short acting bronchodilators Continue maintenance bronchodilators Per patient/spouse-he has been told that he has advanced/end-stage COPD  by his pulmonologist at [facility] has been told that he needs to avoid procedures that involve sedation (hence colonoscopy not done for the past several years)  History of CAD s/p CABG  1996 Aspirin/statin  History of PAD-with concern for critical LLE ischemia with worsening rest pain Has had worsening rest pain-in early overnight-apparently has been having some intermittent pain for the past several days Some skin discoloration in the forefoot area-left foot is somewhat swollen-hard to assess pedal pulse.   Checking ABIs Continue aspirin/statin Difficult situation-he has AKI-risk for further contrast induced nephropathy for angiogram-and on top of that-he does not want to pursue any sort of procedures that we will entail sedation. Will discuss with vascular surgery but may not have realistic revascularization options at this point.  Addendum ABI results noted-briefly curb sided with vascular surgery-if sedation with colonoscopy was not possible-likely will not be able to proceed with any vascular interventions, further he will require contrast-that will cause more AKI.  If stent placed-he will require DAPT-with his suspicion for colon cancer and ongoing iron deficiency anemia-he will be at increased risk for bleeding.  Discussed all of this with patient/spouse at bedside this afternoon-they do not want to pursue any further intervention.  They have talked with case manager-and have elected to go home with hospice tomorrow-awaiting DME hospital bed and other equipment to be delivered.    HTN BP soft-on midodrine All antihypertensives held initially-due to sinus tachycardia-restarted on short acting Cardizem for now.  Chronic hypoxic respiratory failure on home O2 History of lung cancer-s/p SBRT 2022  History of iron deficiency anemia with FDG avidity in the ascending colon on recent PET scan Suspicion for colon cancer-however per prior notes-not a candidate for endoscopic evaluation due to advanced COPD-and his inability to tolerate sedation. GI apparently offered inpatient colonoscopy-patient refused Hemoglobin stable-prior history of FOBT positive but currently not  actively/overtly bleeding  Palliative care Long discussion with patient earlier this morning-and then with spouse over the phone. DNR ordered after speaking with patient/spouse this morning Patient aware of limitations in care-due to AKI/and what sounds like end-stage COPD.  He is aware that he may have an ischemic left leg-and that he may require angiogram/revascularization-but that would entail contrast/sedation for procedures etc.  All he wants to apparently is go home-and "let him be".  Per my conversation with spouse-family has been preparing for this eventuality-and that she would like home hospice set up.  I will discuss with our transition of care team-palliative care consultation placed.  Nutrition Status: Nutrition Problem: Severe Malnutrition Etiology: chronic illness Signs/Symptoms: severe fat depletion, severe muscle depletion Interventions: Ensure Enlive (each supplement provides 350kcal and 20 grams of protein), Magic cup   Underweight: Estimated body mass index is 18.9 kg/m as calculated from the following:   Height as of this encounter: 5\' 3"  (1.6 m).   Weight as of this encounter: 48.4 kg.   Code status:   Code Status: Limited: Do not attempt resuscitation (DNR) -DNR-LIMITED -Do Not Intubate/DNI    DVT Prophylaxis: heparin injection 5,000 Units Start: 07/10/23 1415 SCDs Start: 07/09/23 1857   Family Communication: Spouse-Andree-[phone] updated over the phone   Disposition Plan: Status is: Inpatient Remains inpatient appropriate because: Severity of illness   Planned Discharge Destination:Home health   Diet: Diet Order             DIET DYS 3 Room service appropriate? Yes with Assist; Fluid consistency: Thin  Diet effective now  Antimicrobial agents: Anti-infectives (From admission, onward)    Start     Dose/Rate Route Frequency Ordered Stop   07/11/23 1000  azithromycin (ZITHROMAX) tablet 500 mg        500 mg Oral Daily  07/11/23 0645 07/14/23 0959   07/10/23 2200  fidaxomicin (DIFICID) tablet 200 mg  Status:  Discontinued        200 mg Oral 2 times daily 07/10/23 1610 07/10/23 1613   07/10/23 2200  vancomycin (VANCOCIN) capsule 125 mg  Status:  Discontinued        125 mg Oral 4 times daily 07/10/23 1613 07/11/23 1117   07/10/23 1400  vancomycin (VANCOCIN) capsule 125 mg  Status:  Discontinued        125 mg Oral 4 times daily 07/10/23 1312 07/10/23 1609        MEDICATIONS: Scheduled Meds:  arformoterol  15 mcg Nebulization BID   aspirin EC  81 mg Oral Daily   atorvastatin  20 mg Oral Daily   azithromycin  500 mg Oral Daily   budesonide (PULMICORT) nebulizer solution  0.25 mg Nebulization BID   Chlorhexidine Gluconate Cloth  6 each Topical Daily   cholecalciferol  2,000 Units Oral Daily   diltiazem  30 mg Oral Q8H   feeding supplement  237 mL Oral BID BM   gabapentin  300 mg Oral BID   heparin injection (subcutaneous)  5,000 Units Subcutaneous Q8H   levalbuterol  0.63 mg Nebulization Q8H   And   ipratropium  0.5 mg Nebulization Q8H   methylPREDNISolone (SOLU-MEDROL) injection  40 mg Intravenous Q12H   midodrine  10 mg Oral TID WC   ondansetron (ZOFRAN) IV  4 mg Intravenous Once   pantoprazole  40 mg Oral Daily   revefenacin  175 mcg Nebulization Daily   sodium bicarbonate  650 mg Oral BID   tamsulosin  0.4 mg Oral QPC supper   Continuous Infusions: PRN Meds:.acetaminophen, bismuth subsalicylate, diphenoxylate-atropine, HYDROmorphone, levalbuterol, ondansetron **OR** ondansetron (ZOFRAN) IV   I have personally reviewed following labs and imaging studies  LABORATORY DATA: CBC: Recent Labs  Lab 07/09/23 1550 07/10/23 0248 07/11/23 0633 07/12/23 0250  WBC 8.2 7.0 2.9* 7.3  NEUTROABS 7.0  --   --   --   HGB 10.7* 9.3* 10.1* 9.5*  HCT 33.3* 28.2* 31.5* 30.1*  MCV 95.4 93.1 95.5 98.0  PLT 228 220 226 231    Basic Metabolic Panel: Recent Labs  Lab 07/09/23 1550 07/10/23 0248  07/11/23 0633 07/12/23 0250  NA 128* 133* 138 138  K 4.9 4.5 4.5 4.5  CL 102 106 112* 109  CO2 14* 15* 10* 12*  GLUCOSE 101* 97 67* 87  BUN 92* 95* 84* 96*  CREATININE 7.70* 6.10* 2.93* 3.20*  CALCIUM 6.6* 6.9* 7.5* 7.8*    GFR: Estimated Creatinine Clearance: 13 mL/min (A) (by C-G formula based on SCr of 3.2 mg/dL (H)).  Liver Function Tests: Recent Labs  Lab 07/09/23 1550  AST 14*  ALT 13  ALKPHOS 55  BILITOT 0.8  PROT 5.2*  ALBUMIN 2.5*   Recent Labs  Lab 07/09/23 1550  LIPASE 17   No results for input(s): "AMMONIA" in the last 168 hours.  Coagulation Profile: No results for input(s): "INR", "PROTIME" in the last 168 hours.  Cardiac Enzymes: No results for input(s): "CKTOTAL", "CKMB", "CKMBINDEX", "TROPONINI" in the last 168 hours.  BNP (last 3 results) No results for input(s): "PROBNP" in the last 8760 hours.  Lipid Profile: No results  for input(s): "CHOL", "HDL", "LDLCALC", "TRIG", "CHOLHDL", "LDLDIRECT" in the last 72 hours.  Thyroid Function Tests: Recent Labs    07/10/23 0248  TSH 1.351    Anemia Panel: No results for input(s): "VITAMINB12", "FOLATE", "FERRITIN", "TIBC", "IRON", "RETICCTPCT" in the last 72 hours.  Urine analysis:    Component Value Date/Time   COLORURINE YELLOW 07/09/2023 1823   APPEARANCEUR HAZY (A) 07/09/2023 1823   LABSPEC 1.027 07/09/2023 1823   PHURINE 5.0 07/09/2023 1823   GLUCOSEU NEGATIVE 07/09/2023 1823   HGBUR SMALL (A) 07/09/2023 1823   BILIRUBINUR NEGATIVE 07/09/2023 1823   KETONESUR NEGATIVE 07/09/2023 1823   PROTEINUR 30 (A) 07/09/2023 1823   NITRITE NEGATIVE 07/09/2023 1823   LEUKOCYTESUR NEGATIVE 07/09/2023 1823    Sepsis Labs: Lactic Acid, Venous No results found for: "LATICACIDVEN"  MICROBIOLOGY: Recent Results (from the past 240 hour(s))  Gastrointestinal Panel by PCR , Stool     Status: Abnormal   Collection Time: 07/10/23  6:46 AM   Specimen: STOOL  Result Value Ref Range Status    Campylobacter species NOT DETECTED NOT DETECTED Final   Plesimonas shigelloides NOT DETECTED NOT DETECTED Final   Salmonella species NOT DETECTED NOT DETECTED Final   Yersinia enterocolitica NOT DETECTED NOT DETECTED Final   Vibrio species NOT DETECTED NOT DETECTED Final   Vibrio cholerae NOT DETECTED NOT DETECTED Final   Enteroaggregative E coli (EAEC) NOT DETECTED NOT DETECTED Final   Enteropathogenic E coli (EPEC) DETECTED (A) NOT DETECTED Final    Comment: RESULT CALLED TO, READ BACK BY AND VERIFIED WITH: DAWN MELAND RN @0439  07/11/23 ASW    Enterotoxigenic E coli (ETEC) NOT DETECTED NOT DETECTED Final   Shiga like toxin producing E coli (STEC) NOT DETECTED NOT DETECTED Final   Shigella/Enteroinvasive E coli (EIEC) NOT DETECTED NOT DETECTED Final   Cryptosporidium NOT DETECTED NOT DETECTED Final   Cyclospora cayetanensis NOT DETECTED NOT DETECTED Final   Entamoeba histolytica NOT DETECTED NOT DETECTED Final   Giardia lamblia NOT DETECTED NOT DETECTED Final   Adenovirus F40/41 NOT DETECTED NOT DETECTED Final   Astrovirus NOT DETECTED NOT DETECTED Final   Norovirus GI/GII NOT DETECTED NOT DETECTED Final   Rotavirus A NOT DETECTED NOT DETECTED Final   Sapovirus (I, II, IV, and V) NOT DETECTED NOT DETECTED Final    Comment: Performed at [facility], [address] [place], [place] [address]  C Difficile Quick Screen w PCR reflex     Status: Abnormal   Collection Time: 07/10/23  6:46 AM   Specimen: STOOL  Result Value Ref Range Status   C Diff antigen POSITIVE (A) NEGATIVE Final   C Diff toxin NEGATIVE NEGATIVE Final   C Diff interpretation   Final    Results are indeterminate. Please contact the provider listed for your campus for C diff questions in AMION.    Comment: Performed at [facility], [address] [place], [place] [address]  C. Diff by PCR, Reflexed     Status: None   Collection Time: 07/10/23  6:46 AM  Result Value Ref Range Status   Toxigenic C.  Difficile by PCR NEGATIVE NEGATIVE Final    Comment: Patient is colonized with non toxigenic C. difficile. May not need treatment unless significant symptoms are present. Performed at [facility], [address], [place], [place] [address]     RADIOLOGY STUDIES/RESULTS: DG Chest Port 1 View  Result Date: 07/12/2023 CLINICAL DATA:  141880 SOB (shortness of breath) 141880 Developing  right middle lobe airspace opacity. EXAM: PORTABLE CHEST 1 VIEW COMPARISON:  Chest x-ray 07/11/2023 FINDINGS: The heart and mediastinal contours are unchanged. Atherosclerotic plaque. Left upper lobe linear atelectasis versus scarring. No focal consolidation. Chronic coarsened interstitial markings with no overt pulmonary edema. No pleural effusion. No pneumothorax. No acute osseous abnormality.  Intact sternotomy wires. IMPRESSION: 1. No active disease. 2.  Aortic Atherosclerosis (ICD10-I70.0). Electronically Signed   By: Tish Frederickson M.D.   On: 07/12/2023 05:36   VAS [place] LOWER EXTREMITY VENOUS (DVT)  Result Date: 07/11/2023  Lower Venous DVT Study Patient Name:  JANOAH SAVOCA  Date of Exam:   07/11/2023 Medical Rec #: [id number]       Accession #:    [id number] Date of Birth: [birth year]        Patient Gender: M Patient Age:   25 years Exam Location:  [facility] Procedure:      VAS [place] LOWER EXTREMITY VENOUS (DVT) Referring Phys: Jeoffrey Massed --------------------------------------------------------------------------------  Indications: Swelling, and Edema.  Risk Factors: None identified. Comparison Study: No prior study Performing Technologist: Shona Simpson  Examination Guidelines: A complete evaluation includes B-mode imaging, spectral Doppler, color Doppler, and power Doppler as needed of all accessible portions of each vessel. Bilateral testing is considered an integral part of a complete examination. Limited examinations for reoccurring indications may be performed as noted. The reflux portion of the  exam is performed with the patient in reverse Trendelenburg.  +---------+---------------+---------+-----------+----------+--------------+ RIGHT    CompressibilityPhasicitySpontaneityPropertiesThrombus Aging +---------+---------------+---------+-----------+----------+--------------+ CFV      Full           Yes      Yes                                 +---------+---------------+---------+-----------+----------+--------------+ SFJ      Full                                                        +---------+---------------+---------+-----------+----------+--------------+ FV Prox  Full                                                        +---------+---------------+---------+-----------+----------+--------------+ FV Mid   Full                                                        +---------+---------------+---------+-----------+----------+--------------+ FV DistalFull                                                        +---------+---------------+---------+-----------+----------+--------------+ PFV      Full                                                        +---------+---------------+---------+-----------+----------+--------------+  POP      Full           Yes      Yes                                 +---------+---------------+---------+-----------+----------+--------------+ PTV      Full                                                        +---------+---------------+---------+-----------+----------+--------------+ PERO     Full                                                        +---------+---------------+---------+-----------+----------+--------------+   +---------+---------------+---------+-----------+----------+--------------+ LEFT     CompressibilityPhasicitySpontaneityPropertiesThrombus Aging +---------+---------------+---------+-----------+----------+--------------+ CFV      Full           Yes      Yes                                  +---------+---------------+---------+-----------+----------+--------------+ SFJ      Full                                                        +---------+---------------+---------+-----------+----------+--------------+ FV Prox  Full                                                        +---------+---------------+---------+-----------+----------+--------------+ FV Mid   Full                                                        +---------+---------------+---------+-----------+----------+--------------+ FV DistalFull                                                        +---------+---------------+---------+-----------+----------+--------------+ PFV      Full                                                        +---------+---------------+---------+-----------+----------+--------------+ POP      Full           Yes      Yes                                 +---------+---------------+---------+-----------+----------+--------------+  PTV      Full                                                        +---------+---------------+---------+-----------+----------+--------------+ PERO     Full                                                        +---------+---------------+---------+-----------+----------+--------------+     Summary: BILATERAL: - No evidence of deep vein thrombosis seen in the lower extremities, bilaterally. -No evidence of popliteal cyst, bilaterally.   *See table(s) above for measurements and observations.    Preliminary    DG Chest Port 1V same Day  Result Date: 07/11/2023 CLINICAL DATA:  Shortness of breath EXAM: PORTABLE CHEST 1 VIEW COMPARISON:  CXR 01/13/19 FINDINGS: Status post median sternotomy. No pleural effusion. No pneumothorax. Bibasilar atelectasis. Normal cardiac and mediastinal contours. No radiographically apparent displaced rib fractures. There is a linear opacity in the left upper lobe that is new compared to  prior exam and may represent postsurgical change. There is a hazy right infrahilar airspace opacities that could represent atelectasis or infection. IMPRESSION: 1. Hazy right infrahilar airspace opacity could represent atelectasis or infection. 2. Linear opacity in the left upper lobe is new compared to prior exam and may represent postsurgical change. Electronically Signed   By: Lorenza Cambridge M.D.   On: 07/11/2023 08:31     LOS: 3 days   Jeoffrey Massed, MD  [facility]    To contact the attending provider between 7A-7P or the covering provider during after hours 7P-7A, please log into the web site [email or web address] and access using universal Anna password for that web site. If you do not have the password, please call the hospital operator.  07/12/2023, 10:03 AM

## 2023-07-12 NOTE — Plan of Care (Signed)
Pt has rested quietly throughout the night with no distress noted. Alert and oriented. On O22LNC. Foley cath intact to BSD amber urine. No SOA noted. No complaints voiced. No diarrhea tonight.     Problem: Education: Goal: Knowledge of General Education information will improve Description: Including pain rating scale, medication(s)/side effects and non-pharmacologic comfort measures Outcome: Progressing   Problem: Health Behavior/Discharge Planning: Goal: Ability to manage health-related needs will improve Outcome: Progressing   Problem: Clinical Measurements: Goal: Ability to maintain clinical measurements within normal limits will improve Outcome: Progressing Goal: Will remain free from infection Outcome: Progressing Goal: Diagnostic test results will improve Outcome: Progressing Goal: Respiratory complications will improve Outcome: Progressing Goal: Cardiovascular complication will be avoided Outcome: Progressing   Problem: Activity: Goal: Risk for activity intolerance will decrease Outcome: Progressing   Problem: Nutrition: Goal: Adequate nutrition will be maintained Outcome: Progressing   Problem: Coping: Goal: Level of anxiety will decrease Outcome: Progressing   Problem: Elimination: Goal: Will not experience complications related to bowel motility Outcome: Progressing Goal: Will not experience complications related to urinary retention Outcome: Progressing   Problem: Pain Managment: Goal: General experience of comfort will improve Outcome: Progressing   Problem: Safety: Goal: Ability to remain free from injury will improve Outcome: Progressing   Problem: Skin Integrity: Goal: Risk for impaired skin integrity will decrease Outcome: Progressing

## 2023-07-12 NOTE — Progress Notes (Signed)
ABI's have been completed. Preliminary results can be found in CV Proc through chart review.   07/12/23 11:37 AM Olen Cordial RVT

## 2023-07-12 NOTE — TOC Progression Note (Addendum)
Transition of Care [facility] - Progression Note    Patient Details  Name: William Lee MRN: [id number] Date of Birth: 1946-07-25  Transition of Care [facility]) CM/SW Contact  Lockie Pares, RN Phone Number: 07/12/2023, 12:36 PM  Clinical Narrative:     In depth discussion with provider. Patient is now a DNR and will transition to home hospice with [facility] when ready to DC. Rhunette Croft of [employer] aware and will contact his wife about further DME needs.  TOC will continue to follow 1520 Rhunette Croft messaged from [facility] that the order was placed from their MD and they are good to admit as soon as the patient discharges Expected Discharge Plan: Home w Home Health Services Barriers to Discharge: Continued Medical Work up  Expected Discharge Plan and Services   Discharge Planning Services: CM Consult Post Acute Care Choice: Home Health, Durable Medical Equipment Living arrangements for the past 2 months: Single Family Home                           HH Arranged: RN, PT, OT, Disease Management HH Agency: [facility] Date [facility] Agency Contacted: 07/11/23 Time HH Agency Contacted: [phone] Representative spoke with at [facility] Agency: [place] [facility]   Social Determinants of Health (SDOH) Interventions SDOH Screenings   Food Insecurity: No Food Insecurity (07/09/2023)  Housing: Patient Declined (07/09/2023)  Transportation Needs: No Transportation Needs (07/09/2023)  Utilities: Not At Risk (07/09/2023)  Depression (PHQ2-9): Low Risk  (12/02/2020)  Tobacco Use: Medium Risk (07/09/2023)    Readmission Risk Interventions     No data to display

## 2023-07-12 NOTE — Progress Notes (Signed)
Physical Therapy Treatment Patient Details Name: William Lee MRN: [id number] DOB: [birth year] Today's Date: 07/12/2023   History of Present Illness 77 yo male presents to [facility] on 9/9 with abdominal pain, weakness. Workup for AKI, hypotension. PMH include lung cancer 2022 on 2LO2 all times, papilloma of the larynx, COPD, ischemic cardiomyopathy, EF equal 45% in 2019, chronic anemia and guaiac positive stools, colonic mass recently diagnosed.    PT Comments  Pt received in supine, pt and spouse agreeable to education at bed level. Pt eating lunch with assist from spouse and they defer OOB mobility attempts. Per discussion, pt and spouse requesting not to continue acute PT services as they are planning on a more palliative path going forward. Education emphasis on benefits of supine BLE ROM for comfort/decreased joint pain, pressure relief strategies and caregiver safety with body mechanics (handouts given to reinforce). Pt and spouse receptive to education. Supervising PT LS notified pt requesting to DC acute PT, please reconsult PT if any further needs arise.    If plan is discharge home, recommend the following: A little help with walking and/or transfers;A little help with bathing/dressing/bathroom   Can travel by private vehicle        Equipment Recommendations  Rolling walker (2 wheels) (if pt/family wish; defer to home palliative/hospice services for other DME if needed)    Recommendations for Other Services       Precautions / Restrictions Precautions Precautions: Fall Precaution Comments: 2LO2 chronically Restrictions Weight Bearing Restrictions: No     Mobility  Bed Mobility Overal bed mobility: Needs Assistance Bed Mobility: Rolling Rolling: Min assist         General bed mobility comments: heels floated; pt attempting to eat and agreeable to bed chair posture, he assists with elevating legs for placement of pillow. Pt and spouse defer OOB to chair at this time.     Transfers                   General transfer comment: pt/spouse defer    Ambulation/Gait                   Stairs             Wheelchair Mobility     Tilt Bed    Modified Rankin (Stroke Patients Only)       Balance       Sitting balance - Comments: pt defers                                    Cognition Arousal: Alert Behavior During Therapy: WFL for tasks assessed/performed Overall Cognitive Status: Within Functional Limits for tasks assessed                                 General Comments: discussion with pt and spouse on what their wishes are as far as therapies in hospital/once home, pt/spouse state they are accepting of serious nature of his health conditions and now more interested in his comfort. Pt asked by PTA if he wants PT to follow him while in hospital and he states not at this time. Pt and spouse agreeable to notify MD if they want PT re-consulted. Pt and spouse given educational handouts on caregiver safety/body mechanics and burnout resources, pressure relief strategies and BLE ROM in supine for comfort.  Exercises Other Exercises Other Exercises: Reviewed supine BLE HEP handout he can perform daily PRN for ROM/comfort and to reduce joint stiffness.    General Comments General comments (skin integrity, edema, etc.): see cog section; handouts given      Pertinent Vitals/Pain Pain Assessment Pain Assessment: Faces Faces Pain Scale: Hurts little more Pain Location: left foot, generalized Pain Descriptors / Indicators: Discomfort, Aching Pain Intervention(s): Limited activity within patient's tolerance, Monitored during session, Repositioned, Other (comment) (heels floated)     PT Goals (current goals can now be found in the care plan section) Acute Rehab PT Goals Patient Stated Goal: to go home and be more comfortable PT Goal Formulation: With patient Time For Goal Achievement:  07/24/23 Progress towards PT goals: Goals met/education completed, patient discharged from PT;Not progressing toward goals - comment (pt/family requesting DC acute PT wanting comfort care instead)    Frequency    Min 1X/week      PT Plan         AM-PAC PT "6 Clicks" Mobility   Outcome Measure  Help needed turning from your back to your side while in a flat bed without using bedrails?: A Little Help needed moving from lying on your back to sitting on the side of a flat bed without using bedrails?: A Little Help needed moving to and from a bed to a chair (including a wheelchair)?: A Lot (anticipated) Help needed standing up from a chair using your arms (e.g., wheelchair or bedside chair)?: A Lot Help needed to walk in hospital room?: Total (pt reports he cannot today due to fatigue) Help needed climbing 3-5 steps with a railing? : Total 6 Click Score: 12    End of Session Equipment Utilized During Treatment: Oxygen Activity Tolerance: Patient limited by fatigue;Patient limited by pain Patient left: in bed;with call bell/phone within reach;with bed alarm set;Other (comment);with family/visitor present (spouse assisting him to eat; bed in chair posture) Nurse Communication: Mobility status;Other (comment) (acute PT signing off per pt/family request) PT Visit Diagnosis: Other abnormalities of gait and mobility (R26.89);Muscle weakness (generalized) (M62.81)     Time: 1610-9604 PT Time Calculation (min) (ACUTE ONLY): 11 min  Charges:    $Therapeutic Activity: 8-22 mins PT General Charges $$ ACUTE PT VISIT: 1 Visit                     Darlynn Ricco P., PTA Acute Rehabilitation Services Secure Chat Preferred 9a-5:30pm Office: [phone]    Dorathy Kinsman [facility] 07/12/2023, 3:02 PM

## 2023-07-12 NOTE — Progress Notes (Signed)
Speech Language Pathology Treatment: Dysphagia  Patient Details Name: William Lee MRN: [id number] DOB: [birth year] Today's Date: 07/12/2023 Time: 4098-1191 SLP Time Calculation (min) (ACUTE ONLY): 12 min  Assessment / Plan / Recommendation Clinical Impression  Pt's wife present. She reported "we know he is aspirating and went to pulmonologist office several weeks ago." Pt is scheduled to go home with hospice and wife stated "I'm going to love on him until he's gone." She reported he was unable to masticate the Dys 3 beef today but ate potatoes and jello. Observed him with peach cobbler and thin gingerale. At baseline he has audible chest congestion and work of breathing was decreased from yesterday. He did not show signs of aspiration however his risk is increased with respiratory status however wife is accepting of risks. Discussed diets and pt and wife in agreement to downgrade to Dys 2 (minced). No further ST is needed. Education provided for proper positioning and strategies.    HPI HPI: 77 yo male presents to [facility] on 9/9 with abdominal pain, weakness. Workup for AKI, hypotension. PMH include lung cancer 2022 on 2LO2 all times, papilloma of the larynx, COPD, ischemic cardiomyopathy, EF equal 45% in 2019, chronic anemia and guaiac positive stools, colonic mass recently diagnosed.      SLP Plan  D/C ST       Recommendations for follow up therapy are one component of a multi-disciplinary discharge planning process, led by the attending physician.  Recommendations may be updated based on patient status, additional functional criteria and insurance authorization.    Recommendations  Diet recommendations: Dysphagia 2 (fine chop);Thin liquid Liquids provided via: Straw;Cup Medication Administration: Whole meds with puree Supervision: Staff to assist with self feeding;Full supervision/cueing for compensatory strategies Compensations: Slow rate;Small sips/bites Postural Changes and/or Swallow  Maneuvers: Seated upright 90 degrees                  Oral care BID   Frequent or constant Supervision/Assistance Dysphagia, unspecified (R13.10)     Continue with current plan of care     Royce Macadamia  07/12/2023, 1:20 PM

## 2023-07-13 ENCOUNTER — Other Ambulatory Visit (HOSPITAL_COMMUNITY): Payer: Self-pay

## 2023-07-13 ENCOUNTER — Encounter: Payer: Self-pay | Admitting: Physician Assistant

## 2023-07-13 DIAGNOSIS — N179 Acute kidney failure, unspecified: Secondary | ICD-10-CM | POA: Diagnosis not present

## 2023-07-13 DIAGNOSIS — R1084 Generalized abdominal pain: Secondary | ICD-10-CM | POA: Diagnosis not present

## 2023-07-13 DIAGNOSIS — R197 Diarrhea, unspecified: Secondary | ICD-10-CM | POA: Diagnosis not present

## 2023-07-13 DIAGNOSIS — D649 Anemia, unspecified: Secondary | ICD-10-CM | POA: Diagnosis not present

## 2023-07-13 LAB — BASIC METABOLIC PANEL
Anion gap: 13 (ref 5–15)
BUN: 97 mg/dL — ABNORMAL HIGH (ref 8–23)
CO2: 17 mmol/L — ABNORMAL LOW (ref 22–32)
Calcium: 8.7 mg/dL — ABNORMAL LOW (ref 8.9–10.3)
Chloride: 111 mmol/L (ref 98–111)
Creatinine, Ser: 2.34 mg/dL — ABNORMAL HIGH (ref 0.61–1.24)
GFR, Estimated: 28 mL/min — ABNORMAL LOW (ref >60)
Glucose, Bld: 189 mg/dL — ABNORMAL HIGH (ref 70–99)
Potassium: 4.2 mmol/L (ref 3.5–5.1)
Sodium: 141 mmol/L (ref 135–145)

## 2023-07-13 MED ORDER — ENSURE ENLIVE PO LIQD
237.0000 mL | Freq: Two times a day (BID) | ORAL | 0 refills | Status: AC
Start: 2023-07-13 — End: 2023-08-12
  Filled 2023-07-13: qty 14220, 30d supply, fill #0

## 2023-07-13 MED ORDER — SODIUM BICARBONATE 650 MG PO TABS
1300.0000 mg | ORAL_TABLET | Freq: Two times a day (BID) | ORAL | 0 refills | Status: AC
  Filled 2023-07-13: qty 60, 15d supply, fill #0

## 2023-07-13 MED ORDER — PREDNISONE 10 MG PO TABS
ORAL_TABLET | ORAL | 0 refills | Status: AC
  Filled 2023-07-13: qty 19, 7d supply, fill #0

## 2023-07-13 MED ORDER — HYDROMORPHONE HCL 2 MG PO TABS
1.0000 mg | ORAL_TABLET | Freq: Four times a day (QID) | ORAL | 0 refills | Status: DC | PRN
  Filled 2023-07-13: qty 25, 7d supply, fill #0

## 2023-07-13 MED ORDER — POLYETHYLENE GLYCOL 3350 17 GM/SCOOP PO POWD
17.0000 g | Freq: Every day | ORAL | 0 refills | Status: DC
  Filled 2023-07-13: qty 238, 14d supply, fill #0

## 2023-07-13 MED ORDER — TAMSULOSIN HCL 0.4 MG PO CAPS
0.4000 mg | ORAL_CAPSULE | Freq: Every day | ORAL | 0 refills | Status: DC
  Filled 2023-07-13: qty 30, 30d supply, fill #0

## 2023-07-13 MED ORDER — MIDODRINE HCL 10 MG PO TABS
10.0000 mg | ORAL_TABLET | Freq: Three times a day (TID) | ORAL | 0 refills | Status: DC
  Filled 2023-07-13: qty 90, 30d supply, fill #0

## 2023-07-13 NOTE — TOC Transition Note (Signed)
Transition of Care [facility]) - CM/SW Discharge Note   Patient Details  Name: William Lee MRN: [id number] Date of Birth: 02-05-1946  [facility] CM/SW Contact:  Lockie Pares, RN Phone Number: 07/13/2023, 11:19 AM   Clinical Narrative:    Called Mrs Bardwell to make sure she had everything she needs at home, as the patient is discharged. She commented that it would be nice to obtain sheets  and pads with the bed. Checking to see if those and gloves are covered under insurance, but told her I do not believe they are. Discussed foley care.  Rhunette Croft for [facility] hospice aware of discharge today. Messaged with RN to signal me when ready for PTAR. Patients wife is on the way up to the hospital to get DC instructions.  Final next level of care: Home w Hospice Care Barriers to Discharge: No Barriers Identified   Patient Goals and CMS Choice CMS Medicare.gov Compare Post Acute Care list provided to:: Other (Comment Required) Choice offered to / list presented to : Spouse  Discharge Placement                         Discharge Plan and Services Additional resources added to the After Visit Summary for     Discharge Planning Services: CM Consult Post Acute Care Choice: Home Health, Durable Medical Equipment                    HH Arranged: RN, PT, OT, Disease Management HH Agency: [facility] Date [facility] Agency Contacted: 07/11/23 Time HH Agency Contacted: [phone] Representative spoke with at [facility] Agency: [place] [facility]  Social Determinants of Health (SDOH) Interventions SDOH Screenings   Food Insecurity: No Food Insecurity (07/09/2023)  Housing: Patient Declined (07/09/2023)  Transportation Needs: No Transportation Needs (07/09/2023)  Utilities: Not At Risk (07/09/2023)  Depression (PHQ2-9): Low Risk  (12/02/2020)  Tobacco Use: Medium Risk (07/09/2023)     Readmission Risk Interventions     No data to display

## 2023-07-13 NOTE — Plan of Care (Signed)

## 2023-07-13 NOTE — Progress Notes (Signed)
Palliative:  Consult received and chart reviewed. Goals of care discussed with primary yesterday - patient and family have agreed to go home with hospice care. Checked in with primary - no outstanding palliative needs.   Please call if needs arise.  Gerlean Ren, DNP, AGNP-C Palliative Medicine Team Team Phone # [phone]  Pager # [phone]  NO CHARGE

## 2023-07-13 NOTE — Progress Notes (Signed)
PT Discharge Note  Patient Details Name: William Lee MRN: [id number] DOB: [birth year]   Cancelled Treatment:    Reason Eval/Treat Not Completed: Other (comment) (Pt is going comfort care and pt and spouse asked to discontinue skilled physical therapy services in acute care hospital setting at this time. Please re-consult if further needs arise.)  William Lee, DPT, CLT  Acute Rehabilitation Services Office: [phone] (Secure chat preferred)   William Lee 07/13/2023, 8:26 AM

## 2023-07-13 NOTE — Care Management (Signed)
PTAR called

## 2023-07-13 NOTE — Discharge Summary (Addendum)
triphasic          +---------+------------------+-----+----------+--------+ PTA      104               1.03 monophasic         +---------+------------------+-----+----------+--------+ DP       55                0.54 monophasic         +---------+------------------+-----+----------+--------+ Great Toe                                 Absent   +---------+------------------+-----+----------+--------+ +---------+------------------+-----+-----------+-------+ Left     Lt Pressure (mmHg)IndexWaveform   Comment +---------+------------------+-----+-----------+-------+ Brachial 101                    triphasic          +---------+------------------+-----+-----------+-------+ PTA      254               2.51 multiphasic        +---------+------------------+-----+-----------+-------+ DP       254               2.51 multiphasic        +---------+------------------+-----+-----------+-------+ Great Toe                                  Absent  +---------+------------------+-----+-----------+-------+ +-------+-----------+-----------+------------+------------+ ABI/TBIToday's ABIToday's TBIPrevious ABIPrevious TBI +-------+-----------+-----------+------------+------------+ Right  1.03       Absent                               +-------+-----------+-----------+------------+------------+ Left   [name]         Absent                              +-------+-----------+-----------+------------+------------+  Summary: Right: Resting right ankle-brachial index is within normal range. The right toe-brachial index is abnormal. ABI is likely falsely elevated due to medial calcification. Monophasic flow is noted in the posterior tibial, and dorsalis pedis arteries. Unable to obtain TBI due to absent waveforms. Left: Resting left ankle-brachial index indicates noncompressible left lower extremity arteries. The left toe-brachial index is abnormal. Unable to obtain TBI due to absent waveforms. *See table(s) above for measurements and observations.  Electronically signed by Heath Lark on 07/12/2023 at 10:52:46 PM.    Final    VAS [place] LOWER EXTREMITY VENOUS (DVT)  Result Date: 07/12/2023  Lower Venous DVT Study Patient Name:  William Lee  Date of Exam:   07/11/2023 Medical Rec #: [id number]       Accession #:    [id number] Date of Birth: Oct 10, 1946        Patient Gender: M Patient Age:   77 years Exam Location:  [facility] Procedure:      VAS [place] LOWER EXTREMITY VENOUS (DVT) Referring Phys: Jeoffrey Massed --------------------------------------------------------------------------------  Indications: Swelling, and Edema.  Risk Factors: None identified. Comparison Study: No prior study Performing Technologist: Shona Simpson  Examination Guidelines: A complete evaluation includes B-mode imaging, spectral Doppler, color Doppler, and power Doppler as needed of all accessible portions of each vessel. Bilateral testing is considered an integral part of a complete examination. Limited examinations for reoccurring indications may be performed as noted.  PATIENT DETAILS Name: William Lee Age: 77 y.o. Sex: male Date of Birth: August 01, 1946 MRN: [id number]. Admitting Physician: Buena Irish, MD WUJ:[id number], Curly Rim, MD  Admit Date: 07/09/2023 Discharge date: 07/13/2023  Recommendations for Outpatient Follow-up:  Optimize symptom control Being discharged with Foley catheter-hospice/PCP to consider voiding trial over the next several weeks (especially if patient is stable)  Admitted From:  Home  Disposition: Hospice care   Discharge Condition: fair  CODE STATUS:   Code Status: Limited: Do not attempt resuscitation (DNR) -DNR-LIMITED -Do Not Intubate/DNI    Diet recommendation:  Diet Order             Diet - low sodium heart healthy           DIET DYS 2 Room service appropriate? Yes with Assist; Fluid consistency: Thin  Diet effective now                    Brief Summary: Patient is a 77 y.o.  male with history of COPD on home O2, lung cancer-s/p SBRT August 2022, iron deficiency anemia-PET/CT at [facility] May 2022 with focus of FDG avidity in the ascending colon (felt to be a poor candidate for endoscopic procedures due to advanced COPD)-presented with fatigue/diarrhea/abdominal pain-found to have AKI and subsequently admitted to the hospitalist service.   Significant events: 9/9>> admit to [facility] 9/10>> Foley catheter placed-600 cc immediate urine output. 9/12>> patient opting to go home with hospice-does not want to proceed with any procedures-see discussion below.   Significant studies: 9/9>> CT abdomen/pelvis: Mild small bowel dilatation, diverticulosis without diverticulitis   Significant microbiology data: 9/10>> stool C. difficile antigen: Positive 9/10>> GI pathogen panel: EPEC positive   Procedures: None   Consults: GI Palliative care    Brief Hospital Course: AKI Multifactorial etiology-volume loss due to diarrhea, ARB use, NSAID use and  acute urinary retention UA neg for proteinuria, CT  abdomen negative for hydronephrosis Renal function slowly improved with IVF/Foley catheter placement and other supportive measures  Being discharged home with hospice-if feasible/practical PCP/hospice MD to consider repeat electrolytes in the next several weeks if he is relatively stable. Continue foley on discharge-for comfort-if he stabilizes-PCP/Hospice MD can consider voiding trial   Hyponatremia Secondary to diarrhea/volume loss Improved with IV fluid hydration   Non Anion gap metabolic acidosis Due to AKI/diarrhea Abdominal exam benign Continues to have persistent metabolic acidosis but gradually improving with improvement in renal function/resolution of diarrhea and oral bicarb supplementation. If feasible/practical PCP/hospice MD to consider repeat electrolytes in the next several weeks if he i is relatively stable.   Sinus tachycardia Likely physiological response to pain in LLE/COPD exacerbation  Tachycardia much better today  Lower extremity Dopplers negative Echo pending   Gastroenteritis likely due to enteropathogenic E. coli Doubt C. difficile as stool studies positive for enteropathogenic E. Coli No longer on  oral vancomycin-stool studies likely represents colonization and not a real infection Zithromax x 3 days completed Diarrhea has essentially resolved at this point.   COPD exacerbation Better-started on IV steroids yesterday  Moving air well-has some scattered rhonchi.   Continue usual inhaler/bronchodilator regimen on discharge-and tapering oral steroids.   Per patient/spouse-he has been told that he has advanced/end-stage COPD by his pulmonologist at [facility] has been told that he needs to avoid procedures that involve sedation (hence colonoscopy not done for the past several years)   History of CAD s/p CABG 1996 Aspirin/statin   History of PAD-with concern for critical LLE ischemia with  triphasic          +---------+------------------+-----+----------+--------+ PTA      104               1.03 monophasic         +---------+------------------+-----+----------+--------+ DP       55                0.54 monophasic         +---------+------------------+-----+----------+--------+ Great Toe                                 Absent   +---------+------------------+-----+----------+--------+ +---------+------------------+-----+-----------+-------+ Left     Lt Pressure (mmHg)IndexWaveform   Comment +---------+------------------+-----+-----------+-------+ Brachial 101                    triphasic          +---------+------------------+-----+-----------+-------+ PTA      254               2.51 multiphasic        +---------+------------------+-----+-----------+-------+ DP       254               2.51 multiphasic        +---------+------------------+-----+-----------+-------+ Great Toe                                  Absent  +---------+------------------+-----+-----------+-------+ +-------+-----------+-----------+------------+------------+ ABI/TBIToday's ABIToday's TBIPrevious ABIPrevious TBI +-------+-----------+-----------+------------+------------+ Right  1.03       Absent                               +-------+-----------+-----------+------------+------------+ Left   [name]         Absent                              +-------+-----------+-----------+------------+------------+  Summary: Right: Resting right ankle-brachial index is within normal range. The right toe-brachial index is abnormal. ABI is likely falsely elevated due to medial calcification. Monophasic flow is noted in the posterior tibial, and dorsalis pedis arteries. Unable to obtain TBI due to absent waveforms. Left: Resting left ankle-brachial index indicates noncompressible left lower extremity arteries. The left toe-brachial index is abnormal. Unable to obtain TBI due to absent waveforms. *See table(s) above for measurements and observations.  Electronically signed by Heath Lark on 07/12/2023 at 10:52:46 PM.    Final    VAS [place] LOWER EXTREMITY VENOUS (DVT)  Result Date: 07/12/2023  Lower Venous DVT Study Patient Name:  William Lee  Date of Exam:   07/11/2023 Medical Rec #: [id number]       Accession #:    [id number] Date of Birth: Oct 10, 1946        Patient Gender: M Patient Age:   77 years Exam Location:  [facility] Procedure:      VAS [place] LOWER EXTREMITY VENOUS (DVT) Referring Phys: Jeoffrey Massed --------------------------------------------------------------------------------  Indications: Swelling, and Edema.  Risk Factors: None identified. Comparison Study: No prior study Performing Technologist: Shona Simpson  Examination Guidelines: A complete evaluation includes B-mode imaging, spectral Doppler, color Doppler, and power Doppler as needed of all accessible portions of each vessel. Bilateral testing is considered an integral part of a complete examination. Limited examinations for reoccurring indications may be performed as noted.  triphasic          +---------+------------------+-----+-----------+-------+ PTA      254               2.51 multiphasic        +---------+------------------+-----+-----------+-------+ DP       254               2.51 multiphasic        +---------+------------------+-----+-----------+-------+ Great Toe                                  Absent  +---------+------------------+-----+-----------+-------+ +-------+-----------+-----------+------------+------------+ ABI/TBIToday's ABIToday's TBIPrevious ABIPrevious TBI  +-------+-----------+-----------+------------+------------+ Right  1.03       Absent                              +-------+-----------+-----------+------------+------------+ Left   [place]         Absent                              +-------+-----------+-----------+------------+------------+  Summary: Right: Resting right ankle-brachial index is within normal range. The right toe-brachial index is abnormal. ABI is likely falsely elevated due to medial calcification. Monophasic flow is noted in the posterior tibial, and dorsalis pedis arteries. Unable to obtain TBI due to absent waveforms. Left: Resting left ankle-brachial index indicates noncompressible left lower extremity arteries. The left toe-brachial index is abnormal. Unable to obtain TBI due to absent waveforms. *See table(s) above for measurements and observations.  Electronically signed by Heath Lark on 07/12/2023 at 10:52:46 PM.    Final    VAS [place] LOWER EXTREMITY VENOUS (DVT)  Result Date: 07/12/2023  Lower Venous DVT Study Patient Name:  CHANDARA RORRER  Date of Exam:   07/11/2023 Medical Rec #: [id number]       Accession #:    [id number] Date of Birth: [birth year]        Patient Gender: M Patient Age:   77 years Exam Location:  [facility] Procedure:      VAS [place] LOWER EXTREMITY VENOUS (DVT) Referring Phys: Jeoffrey Massed --------------------------------------------------------------------------------  Indications: Swelling, and Edema.  Risk Factors: None identified. Comparison Study: No prior study Performing Technologist: Shona Simpson  Examination Guidelines: A complete evaluation includes B-mode imaging, spectral Doppler, color Doppler, and power Doppler as needed of all accessible portions of each vessel. Bilateral testing is considered an integral part of a complete examination. Limited examinations for reoccurring indications may be performed as noted. The reflux portion of the exam is performed with the patient in reverse  Trendelenburg.  +---------+---------------+---------+-----------+----------+--------------+ RIGHT    CompressibilityPhasicitySpontaneityPropertiesThrombus Aging +---------+---------------+---------+-----------+----------+--------------+ CFV      Full           Yes      Yes                                 +---------+---------------+---------+-----------+----------+--------------+ SFJ      Full                                                        +---------+---------------+---------+-----------+----------+--------------+ FV Prox  Full                                                        +---------+---------------+---------+-----------+----------+--------------+  triphasic          +---------+------------------+-----+-----------+-------+ PTA      254               2.51 multiphasic        +---------+------------------+-----+-----------+-------+ DP       254               2.51 multiphasic        +---------+------------------+-----+-----------+-------+ Great Toe                                  Absent  +---------+------------------+-----+-----------+-------+ +-------+-----------+-----------+------------+------------+ ABI/TBIToday's ABIToday's TBIPrevious ABIPrevious TBI  +-------+-----------+-----------+------------+------------+ Right  1.03       Absent                              +-------+-----------+-----------+------------+------------+ Left   [place]         Absent                              +-------+-----------+-----------+------------+------------+  Summary: Right: Resting right ankle-brachial index is within normal range. The right toe-brachial index is abnormal. ABI is likely falsely elevated due to medial calcification. Monophasic flow is noted in the posterior tibial, and dorsalis pedis arteries. Unable to obtain TBI due to absent waveforms. Left: Resting left ankle-brachial index indicates noncompressible left lower extremity arteries. The left toe-brachial index is abnormal. Unable to obtain TBI due to absent waveforms. *See table(s) above for measurements and observations.  Electronically signed by Heath Lark on 07/12/2023 at 10:52:46 PM.    Final    VAS [place] LOWER EXTREMITY VENOUS (DVT)  Result Date: 07/12/2023  Lower Venous DVT Study Patient Name:  CHANDARA RORRER  Date of Exam:   07/11/2023 Medical Rec #: [id number]       Accession #:    [id number] Date of Birth: [birth year]        Patient Gender: M Patient Age:   77 years Exam Location:  [facility] Procedure:      VAS [place] LOWER EXTREMITY VENOUS (DVT) Referring Phys: Jeoffrey Massed --------------------------------------------------------------------------------  Indications: Swelling, and Edema.  Risk Factors: None identified. Comparison Study: No prior study Performing Technologist: Shona Simpson  Examination Guidelines: A complete evaluation includes B-mode imaging, spectral Doppler, color Doppler, and power Doppler as needed of all accessible portions of each vessel. Bilateral testing is considered an integral part of a complete examination. Limited examinations for reoccurring indications may be performed as noted. The reflux portion of the exam is performed with the patient in reverse  Trendelenburg.  +---------+---------------+---------+-----------+----------+--------------+ RIGHT    CompressibilityPhasicitySpontaneityPropertiesThrombus Aging +---------+---------------+---------+-----------+----------+--------------+ CFV      Full           Yes      Yes                                 +---------+---------------+---------+-----------+----------+--------------+ SFJ      Full                                                        +---------+---------------+---------+-----------+----------+--------------+ FV Prox  Full                                                        +---------+---------------+---------+-----------+----------+--------------+  triphasic          +---------+------------------+-----+----------+--------+ PTA      104               1.03 monophasic         +---------+------------------+-----+----------+--------+ DP       55                0.54 monophasic         +---------+------------------+-----+----------+--------+ Great Toe                                 Absent   +---------+------------------+-----+----------+--------+ +---------+------------------+-----+-----------+-------+ Left     Lt Pressure (mmHg)IndexWaveform   Comment +---------+------------------+-----+-----------+-------+ Brachial 101                    triphasic          +---------+------------------+-----+-----------+-------+ PTA      254               2.51 multiphasic        +---------+------------------+-----+-----------+-------+ DP       254               2.51 multiphasic        +---------+------------------+-----+-----------+-------+ Great Toe                                  Absent  +---------+------------------+-----+-----------+-------+ +-------+-----------+-----------+------------+------------+ ABI/TBIToday's ABIToday's TBIPrevious ABIPrevious TBI +-------+-----------+-----------+------------+------------+ Right  1.03       Absent                               +-------+-----------+-----------+------------+------------+ Left   [name]         Absent                              +-------+-----------+-----------+------------+------------+  Summary: Right: Resting right ankle-brachial index is within normal range. The right toe-brachial index is abnormal. ABI is likely falsely elevated due to medial calcification. Monophasic flow is noted in the posterior tibial, and dorsalis pedis arteries. Unable to obtain TBI due to absent waveforms. Left: Resting left ankle-brachial index indicates noncompressible left lower extremity arteries. The left toe-brachial index is abnormal. Unable to obtain TBI due to absent waveforms. *See table(s) above for measurements and observations.  Electronically signed by Heath Lark on 07/12/2023 at 10:52:46 PM.    Final    VAS [place] LOWER EXTREMITY VENOUS (DVT)  Result Date: 07/12/2023  Lower Venous DVT Study Patient Name:  William Lee  Date of Exam:   07/11/2023 Medical Rec #: [id number]       Accession #:    [id number] Date of Birth: Oct 10, 1946        Patient Gender: M Patient Age:   77 years Exam Location:  [facility] Procedure:      VAS [place] LOWER EXTREMITY VENOUS (DVT) Referring Phys: Jeoffrey Massed --------------------------------------------------------------------------------  Indications: Swelling, and Edema.  Risk Factors: None identified. Comparison Study: No prior study Performing Technologist: Shona Simpson  Examination Guidelines: A complete evaluation includes B-mode imaging, spectral Doppler, color Doppler, and power Doppler as needed of all accessible portions of each vessel. Bilateral testing is considered an integral part of a complete examination. Limited examinations for reoccurring indications may be performed as noted.  triphasic          +---------+------------------+-----+----------+--------+ PTA      104               1.03 monophasic         +---------+------------------+-----+----------+--------+ DP       55                0.54 monophasic         +---------+------------------+-----+----------+--------+ Great Toe                                 Absent   +---------+------------------+-----+----------+--------+ +---------+------------------+-----+-----------+-------+ Left     Lt Pressure (mmHg)IndexWaveform   Comment +---------+------------------+-----+-----------+-------+ Brachial 101                    triphasic          +---------+------------------+-----+-----------+-------+ PTA      254               2.51 multiphasic        +---------+------------------+-----+-----------+-------+ DP       254               2.51 multiphasic        +---------+------------------+-----+-----------+-------+ Great Toe                                  Absent  +---------+------------------+-----+-----------+-------+ +-------+-----------+-----------+------------+------------+ ABI/TBIToday's ABIToday's TBIPrevious ABIPrevious TBI +-------+-----------+-----------+------------+------------+ Right  1.03       Absent                               +-------+-----------+-----------+------------+------------+ Left   [name]         Absent                              +-------+-----------+-----------+------------+------------+  Summary: Right: Resting right ankle-brachial index is within normal range. The right toe-brachial index is abnormal. ABI is likely falsely elevated due to medial calcification. Monophasic flow is noted in the posterior tibial, and dorsalis pedis arteries. Unable to obtain TBI due to absent waveforms. Left: Resting left ankle-brachial index indicates noncompressible left lower extremity arteries. The left toe-brachial index is abnormal. Unable to obtain TBI due to absent waveforms. *See table(s) above for measurements and observations.  Electronically signed by Heath Lark on 07/12/2023 at 10:52:46 PM.    Final    VAS [place] LOWER EXTREMITY VENOUS (DVT)  Result Date: 07/12/2023  Lower Venous DVT Study Patient Name:  William Lee  Date of Exam:   07/11/2023 Medical Rec #: [id number]       Accession #:    [id number] Date of Birth: Oct 10, 1946        Patient Gender: M Patient Age:   77 years Exam Location:  [facility] Procedure:      VAS [place] LOWER EXTREMITY VENOUS (DVT) Referring Phys: Jeoffrey Massed --------------------------------------------------------------------------------  Indications: Swelling, and Edema.  Risk Factors: None identified. Comparison Study: No prior study Performing Technologist: Shona Simpson  Examination Guidelines: A complete evaluation includes B-mode imaging, spectral Doppler, color Doppler, and power Doppler as needed of all accessible portions of each vessel. Bilateral testing is considered an integral part of a complete examination. Limited examinations for reoccurring indications may be performed as noted.  triphasic          +---------+------------------+-----+-----------+-------+ PTA      254               2.51 multiphasic        +---------+------------------+-----+-----------+-------+ DP       254               2.51 multiphasic        +---------+------------------+-----+-----------+-------+ Great Toe                                  Absent  +---------+------------------+-----+-----------+-------+ +-------+-----------+-----------+------------+------------+ ABI/TBIToday's ABIToday's TBIPrevious ABIPrevious TBI  +-------+-----------+-----------+------------+------------+ Right  1.03       Absent                              +-------+-----------+-----------+------------+------------+ Left   [place]         Absent                              +-------+-----------+-----------+------------+------------+  Summary: Right: Resting right ankle-brachial index is within normal range. The right toe-brachial index is abnormal. ABI is likely falsely elevated due to medial calcification. Monophasic flow is noted in the posterior tibial, and dorsalis pedis arteries. Unable to obtain TBI due to absent waveforms. Left: Resting left ankle-brachial index indicates noncompressible left lower extremity arteries. The left toe-brachial index is abnormal. Unable to obtain TBI due to absent waveforms. *See table(s) above for measurements and observations.  Electronically signed by Heath Lark on 07/12/2023 at 10:52:46 PM.    Final    VAS [place] LOWER EXTREMITY VENOUS (DVT)  Result Date: 07/12/2023  Lower Venous DVT Study Patient Name:  CHANDARA RORRER  Date of Exam:   07/11/2023 Medical Rec #: [id number]       Accession #:    [id number] Date of Birth: [birth year]        Patient Gender: M Patient Age:   77 years Exam Location:  [facility] Procedure:      VAS [place] LOWER EXTREMITY VENOUS (DVT) Referring Phys: Jeoffrey Massed --------------------------------------------------------------------------------  Indications: Swelling, and Edema.  Risk Factors: None identified. Comparison Study: No prior study Performing Technologist: Shona Simpson  Examination Guidelines: A complete evaluation includes B-mode imaging, spectral Doppler, color Doppler, and power Doppler as needed of all accessible portions of each vessel. Bilateral testing is considered an integral part of a complete examination. Limited examinations for reoccurring indications may be performed as noted. The reflux portion of the exam is performed with the patient in reverse  Trendelenburg.  +---------+---------------+---------+-----------+----------+--------------+ RIGHT    CompressibilityPhasicitySpontaneityPropertiesThrombus Aging +---------+---------------+---------+-----------+----------+--------------+ CFV      Full           Yes      Yes                                 +---------+---------------+---------+-----------+----------+--------------+ SFJ      Full                                                        +---------+---------------+---------+-----------+----------+--------------+ FV Prox  Full                                                        +---------+---------------+---------+-----------+----------+--------------+  triphasic          +---------+------------------+-----+-----------+-------+ PTA      254               2.51 multiphasic        +---------+------------------+-----+-----------+-------+ DP       254               2.51 multiphasic        +---------+------------------+-----+-----------+-------+ Great Toe                                  Absent  +---------+------------------+-----+-----------+-------+ +-------+-----------+-----------+------------+------------+ ABI/TBIToday's ABIToday's TBIPrevious ABIPrevious TBI  +-------+-----------+-----------+------------+------------+ Right  1.03       Absent                              +-------+-----------+-----------+------------+------------+ Left   [place]         Absent                              +-------+-----------+-----------+------------+------------+  Summary: Right: Resting right ankle-brachial index is within normal range. The right toe-brachial index is abnormal. ABI is likely falsely elevated due to medial calcification. Monophasic flow is noted in the posterior tibial, and dorsalis pedis arteries. Unable to obtain TBI due to absent waveforms. Left: Resting left ankle-brachial index indicates noncompressible left lower extremity arteries. The left toe-brachial index is abnormal. Unable to obtain TBI due to absent waveforms. *See table(s) above for measurements and observations.  Electronically signed by Heath Lark on 07/12/2023 at 10:52:46 PM.    Final    VAS [place] LOWER EXTREMITY VENOUS (DVT)  Result Date: 07/12/2023  Lower Venous DVT Study Patient Name:  CHANDARA RORRER  Date of Exam:   07/11/2023 Medical Rec #: [id number]       Accession #:    [id number] Date of Birth: [birth year]        Patient Gender: M Patient Age:   77 years Exam Location:  [facility] Procedure:      VAS [place] LOWER EXTREMITY VENOUS (DVT) Referring Phys: Jeoffrey Massed --------------------------------------------------------------------------------  Indications: Swelling, and Edema.  Risk Factors: None identified. Comparison Study: No prior study Performing Technologist: Shona Simpson  Examination Guidelines: A complete evaluation includes B-mode imaging, spectral Doppler, color Doppler, and power Doppler as needed of all accessible portions of each vessel. Bilateral testing is considered an integral part of a complete examination. Limited examinations for reoccurring indications may be performed as noted. The reflux portion of the exam is performed with the patient in reverse  Trendelenburg.  +---------+---------------+---------+-----------+----------+--------------+ RIGHT    CompressibilityPhasicitySpontaneityPropertiesThrombus Aging +---------+---------------+---------+-----------+----------+--------------+ CFV      Full           Yes      Yes                                 +---------+---------------+---------+-----------+----------+--------------+ SFJ      Full                                                        +---------+---------------+---------+-----------+----------+--------------+ FV Prox  Full                                                        +---------+---------------+---------+-----------+----------+--------------+  PATIENT DETAILS Name: William Lee Age: 77 y.o. Sex: male Date of Birth: August 01, 1946 MRN: [id number]. Admitting Physician: Buena Irish, MD WUJ:[id number], Curly Rim, MD  Admit Date: 07/09/2023 Discharge date: 07/13/2023  Recommendations for Outpatient Follow-up:  Optimize symptom control Being discharged with Foley catheter-hospice/PCP to consider voiding trial over the next several weeks (especially if patient is stable)  Admitted From:  Home  Disposition: Hospice care   Discharge Condition: fair  CODE STATUS:   Code Status: Limited: Do not attempt resuscitation (DNR) -DNR-LIMITED -Do Not Intubate/DNI    Diet recommendation:  Diet Order             Diet - low sodium heart healthy           DIET DYS 2 Room service appropriate? Yes with Assist; Fluid consistency: Thin  Diet effective now                    Brief Summary: Patient is a 77 y.o.  male with history of COPD on home O2, lung cancer-s/p SBRT August 2022, iron deficiency anemia-PET/CT at [facility] May 2022 with focus of FDG avidity in the ascending colon (felt to be a poor candidate for endoscopic procedures due to advanced COPD)-presented with fatigue/diarrhea/abdominal pain-found to have AKI and subsequently admitted to the hospitalist service.   Significant events: 9/9>> admit to [facility] 9/10>> Foley catheter placed-600 cc immediate urine output. 9/12>> patient opting to go home with hospice-does not want to proceed with any procedures-see discussion below.   Significant studies: 9/9>> CT abdomen/pelvis: Mild small bowel dilatation, diverticulosis without diverticulitis   Significant microbiology data: 9/10>> stool C. difficile antigen: Positive 9/10>> GI pathogen panel: EPEC positive   Procedures: None   Consults: GI Palliative care    Brief Hospital Course: AKI Multifactorial etiology-volume loss due to diarrhea, ARB use, NSAID use and  acute urinary retention UA neg for proteinuria, CT  abdomen negative for hydronephrosis Renal function slowly improved with IVF/Foley catheter placement and other supportive measures  Being discharged home with hospice-if feasible/practical PCP/hospice MD to consider repeat electrolytes in the next several weeks if he is relatively stable. Continue foley on discharge-for comfort-if he stabilizes-PCP/Hospice MD can consider voiding trial   Hyponatremia Secondary to diarrhea/volume loss Improved with IV fluid hydration   Non Anion gap metabolic acidosis Due to AKI/diarrhea Abdominal exam benign Continues to have persistent metabolic acidosis but gradually improving with improvement in renal function/resolution of diarrhea and oral bicarb supplementation. If feasible/practical PCP/hospice MD to consider repeat electrolytes in the next several weeks if he i is relatively stable.   Sinus tachycardia Likely physiological response to pain in LLE/COPD exacerbation  Tachycardia much better today  Lower extremity Dopplers negative Echo pending   Gastroenteritis likely due to enteropathogenic E. coli Doubt C. difficile as stool studies positive for enteropathogenic E. Coli No longer on  oral vancomycin-stool studies likely represents colonization and not a real infection Zithromax x 3 days completed Diarrhea has essentially resolved at this point.   COPD exacerbation Better-started on IV steroids yesterday  Moving air well-has some scattered rhonchi.   Continue usual inhaler/bronchodilator regimen on discharge-and tapering oral steroids.   Per patient/spouse-he has been told that he has advanced/end-stage COPD by his pulmonologist at [facility] has been told that he needs to avoid procedures that involve sedation (hence colonoscopy not done for the past several years)   History of CAD s/p CABG 1996 Aspirin/statin   History of PAD-with concern for critical LLE ischemia with  PATIENT DETAILS Name: William Lee Age: 77 y.o. Sex: male Date of Birth: August 01, 1946 MRN: [id number]. Admitting Physician: Buena Irish, MD WUJ:[id number], Curly Rim, MD  Admit Date: 07/09/2023 Discharge date: 07/13/2023  Recommendations for Outpatient Follow-up:  Optimize symptom control Being discharged with Foley catheter-hospice/PCP to consider voiding trial over the next several weeks (especially if patient is stable)  Admitted From:  Home  Disposition: Hospice care   Discharge Condition: fair  CODE STATUS:   Code Status: Limited: Do not attempt resuscitation (DNR) -DNR-LIMITED -Do Not Intubate/DNI    Diet recommendation:  Diet Order             Diet - low sodium heart healthy           DIET DYS 2 Room service appropriate? Yes with Assist; Fluid consistency: Thin  Diet effective now                    Brief Summary: Patient is a 77 y.o.  male with history of COPD on home O2, lung cancer-s/p SBRT August 2022, iron deficiency anemia-PET/CT at [facility] May 2022 with focus of FDG avidity in the ascending colon (felt to be a poor candidate for endoscopic procedures due to advanced COPD)-presented with fatigue/diarrhea/abdominal pain-found to have AKI and subsequently admitted to the hospitalist service.   Significant events: 9/9>> admit to [facility] 9/10>> Foley catheter placed-600 cc immediate urine output. 9/12>> patient opting to go home with hospice-does not want to proceed with any procedures-see discussion below.   Significant studies: 9/9>> CT abdomen/pelvis: Mild small bowel dilatation, diverticulosis without diverticulitis   Significant microbiology data: 9/10>> stool C. difficile antigen: Positive 9/10>> GI pathogen panel: EPEC positive   Procedures: None   Consults: GI Palliative care    Brief Hospital Course: AKI Multifactorial etiology-volume loss due to diarrhea, ARB use, NSAID use and  acute urinary retention UA neg for proteinuria, CT  abdomen negative for hydronephrosis Renal function slowly improved with IVF/Foley catheter placement and other supportive measures  Being discharged home with hospice-if feasible/practical PCP/hospice MD to consider repeat electrolytes in the next several weeks if he is relatively stable. Continue foley on discharge-for comfort-if he stabilizes-PCP/Hospice MD can consider voiding trial   Hyponatremia Secondary to diarrhea/volume loss Improved with IV fluid hydration   Non Anion gap metabolic acidosis Due to AKI/diarrhea Abdominal exam benign Continues to have persistent metabolic acidosis but gradually improving with improvement in renal function/resolution of diarrhea and oral bicarb supplementation. If feasible/practical PCP/hospice MD to consider repeat electrolytes in the next several weeks if he i is relatively stable.   Sinus tachycardia Likely physiological response to pain in LLE/COPD exacerbation  Tachycardia much better today  Lower extremity Dopplers negative Echo pending   Gastroenteritis likely due to enteropathogenic E. coli Doubt C. difficile as stool studies positive for enteropathogenic E. Coli No longer on  oral vancomycin-stool studies likely represents colonization and not a real infection Zithromax x 3 days completed Diarrhea has essentially resolved at this point.   COPD exacerbation Better-started on IV steroids yesterday  Moving air well-has some scattered rhonchi.   Continue usual inhaler/bronchodilator regimen on discharge-and tapering oral steroids.   Per patient/spouse-he has been told that he has advanced/end-stage COPD by his pulmonologist at [facility] has been told that he needs to avoid procedures that involve sedation (hence colonoscopy not done for the past several years)   History of CAD s/p CABG 1996 Aspirin/statin   History of PAD-with concern for critical LLE ischemia with  triphasic          +---------+------------------+-----+----------+--------+ PTA      104               1.03 monophasic         +---------+------------------+-----+----------+--------+ DP       55                0.54 monophasic         +---------+------------------+-----+----------+--------+ Great Toe                                 Absent   +---------+------------------+-----+----------+--------+ +---------+------------------+-----+-----------+-------+ Left     Lt Pressure (mmHg)IndexWaveform   Comment +---------+------------------+-----+-----------+-------+ Brachial 101                    triphasic          +---------+------------------+-----+-----------+-------+ PTA      254               2.51 multiphasic        +---------+------------------+-----+-----------+-------+ DP       254               2.51 multiphasic        +---------+------------------+-----+-----------+-------+ Great Toe                                  Absent  +---------+------------------+-----+-----------+-------+ +-------+-----------+-----------+------------+------------+ ABI/TBIToday's ABIToday's TBIPrevious ABIPrevious TBI +-------+-----------+-----------+------------+------------+ Right  1.03       Absent                               +-------+-----------+-----------+------------+------------+ Left   [name]         Absent                              +-------+-----------+-----------+------------+------------+  Summary: Right: Resting right ankle-brachial index is within normal range. The right toe-brachial index is abnormal. ABI is likely falsely elevated due to medial calcification. Monophasic flow is noted in the posterior tibial, and dorsalis pedis arteries. Unable to obtain TBI due to absent waveforms. Left: Resting left ankle-brachial index indicates noncompressible left lower extremity arteries. The left toe-brachial index is abnormal. Unable to obtain TBI due to absent waveforms. *See table(s) above for measurements and observations.  Electronically signed by Heath Lark on 07/12/2023 at 10:52:46 PM.    Final    VAS [place] LOWER EXTREMITY VENOUS (DVT)  Result Date: 07/12/2023  Lower Venous DVT Study Patient Name:  William Lee  Date of Exam:   07/11/2023 Medical Rec #: [id number]       Accession #:    [id number] Date of Birth: Oct 10, 1946        Patient Gender: M Patient Age:   77 years Exam Location:  [facility] Procedure:      VAS [place] LOWER EXTREMITY VENOUS (DVT) Referring Phys: Jeoffrey Massed --------------------------------------------------------------------------------  Indications: Swelling, and Edema.  Risk Factors: None identified. Comparison Study: No prior study Performing Technologist: Shona Simpson  Examination Guidelines: A complete evaluation includes B-mode imaging, spectral Doppler, color Doppler, and power Doppler as needed of all accessible portions of each vessel. Bilateral testing is considered an integral part of a complete examination. Limited examinations for reoccurring indications may be performed as noted.  PATIENT DETAILS Name: William Lee Age: 77 y.o. Sex: male Date of Birth: August 01, 1946 MRN: [id number]. Admitting Physician: Buena Irish, MD WUJ:[id number], Curly Rim, MD  Admit Date: 07/09/2023 Discharge date: 07/13/2023  Recommendations for Outpatient Follow-up:  Optimize symptom control Being discharged with Foley catheter-hospice/PCP to consider voiding trial over the next several weeks (especially if patient is stable)  Admitted From:  Home  Disposition: Hospice care   Discharge Condition: fair  CODE STATUS:   Code Status: Limited: Do not attempt resuscitation (DNR) -DNR-LIMITED -Do Not Intubate/DNI    Diet recommendation:  Diet Order             Diet - low sodium heart healthy           DIET DYS 2 Room service appropriate? Yes with Assist; Fluid consistency: Thin  Diet effective now                    Brief Summary: Patient is a 77 y.o.  male with history of COPD on home O2, lung cancer-s/p SBRT August 2022, iron deficiency anemia-PET/CT at [facility] May 2022 with focus of FDG avidity in the ascending colon (felt to be a poor candidate for endoscopic procedures due to advanced COPD)-presented with fatigue/diarrhea/abdominal pain-found to have AKI and subsequently admitted to the hospitalist service.   Significant events: 9/9>> admit to [facility] 9/10>> Foley catheter placed-600 cc immediate urine output. 9/12>> patient opting to go home with hospice-does not want to proceed with any procedures-see discussion below.   Significant studies: 9/9>> CT abdomen/pelvis: Mild small bowel dilatation, diverticulosis without diverticulitis   Significant microbiology data: 9/10>> stool C. difficile antigen: Positive 9/10>> GI pathogen panel: EPEC positive   Procedures: None   Consults: GI Palliative care    Brief Hospital Course: AKI Multifactorial etiology-volume loss due to diarrhea, ARB use, NSAID use and  acute urinary retention UA neg for proteinuria, CT  abdomen negative for hydronephrosis Renal function slowly improved with IVF/Foley catheter placement and other supportive measures  Being discharged home with hospice-if feasible/practical PCP/hospice MD to consider repeat electrolytes in the next several weeks if he is relatively stable. Continue foley on discharge-for comfort-if he stabilizes-PCP/Hospice MD can consider voiding trial   Hyponatremia Secondary to diarrhea/volume loss Improved with IV fluid hydration   Non Anion gap metabolic acidosis Due to AKI/diarrhea Abdominal exam benign Continues to have persistent metabolic acidosis but gradually improving with improvement in renal function/resolution of diarrhea and oral bicarb supplementation. If feasible/practical PCP/hospice MD to consider repeat electrolytes in the next several weeks if he i is relatively stable.   Sinus tachycardia Likely physiological response to pain in LLE/COPD exacerbation  Tachycardia much better today  Lower extremity Dopplers negative Echo pending   Gastroenteritis likely due to enteropathogenic E. coli Doubt C. difficile as stool studies positive for enteropathogenic E. Coli No longer on  oral vancomycin-stool studies likely represents colonization and not a real infection Zithromax x 3 days completed Diarrhea has essentially resolved at this point.   COPD exacerbation Better-started on IV steroids yesterday  Moving air well-has some scattered rhonchi.   Continue usual inhaler/bronchodilator regimen on discharge-and tapering oral steroids.   Per patient/spouse-he has been told that he has advanced/end-stage COPD by his pulmonologist at [facility] has been told that he needs to avoid procedures that involve sedation (hence colonoscopy not done for the past several years)   History of CAD s/p CABG 1996 Aspirin/statin   History of PAD-with concern for critical LLE ischemia with  PATIENT DETAILS Name: William Lee Age: 77 y.o. Sex: male Date of Birth: August 01, 1946 MRN: [id number]. Admitting Physician: Buena Irish, MD WUJ:[id number], Curly Rim, MD  Admit Date: 07/09/2023 Discharge date: 07/13/2023  Recommendations for Outpatient Follow-up:  Optimize symptom control Being discharged with Foley catheter-hospice/PCP to consider voiding trial over the next several weeks (especially if patient is stable)  Admitted From:  Home  Disposition: Hospice care   Discharge Condition: fair  CODE STATUS:   Code Status: Limited: Do not attempt resuscitation (DNR) -DNR-LIMITED -Do Not Intubate/DNI    Diet recommendation:  Diet Order             Diet - low sodium heart healthy           DIET DYS 2 Room service appropriate? Yes with Assist; Fluid consistency: Thin  Diet effective now                    Brief Summary: Patient is a 77 y.o.  male with history of COPD on home O2, lung cancer-s/p SBRT August 2022, iron deficiency anemia-PET/CT at [facility] May 2022 with focus of FDG avidity in the ascending colon (felt to be a poor candidate for endoscopic procedures due to advanced COPD)-presented with fatigue/diarrhea/abdominal pain-found to have AKI and subsequently admitted to the hospitalist service.   Significant events: 9/9>> admit to [facility] 9/10>> Foley catheter placed-600 cc immediate urine output. 9/12>> patient opting to go home with hospice-does not want to proceed with any procedures-see discussion below.   Significant studies: 9/9>> CT abdomen/pelvis: Mild small bowel dilatation, diverticulosis without diverticulitis   Significant microbiology data: 9/10>> stool C. difficile antigen: Positive 9/10>> GI pathogen panel: EPEC positive   Procedures: None   Consults: GI Palliative care    Brief Hospital Course: AKI Multifactorial etiology-volume loss due to diarrhea, ARB use, NSAID use and  acute urinary retention UA neg for proteinuria, CT  abdomen negative for hydronephrosis Renal function slowly improved with IVF/Foley catheter placement and other supportive measures  Being discharged home with hospice-if feasible/practical PCP/hospice MD to consider repeat electrolytes in the next several weeks if he is relatively stable. Continue foley on discharge-for comfort-if he stabilizes-PCP/Hospice MD can consider voiding trial   Hyponatremia Secondary to diarrhea/volume loss Improved with IV fluid hydration   Non Anion gap metabolic acidosis Due to AKI/diarrhea Abdominal exam benign Continues to have persistent metabolic acidosis but gradually improving with improvement in renal function/resolution of diarrhea and oral bicarb supplementation. If feasible/practical PCP/hospice MD to consider repeat electrolytes in the next several weeks if he i is relatively stable.   Sinus tachycardia Likely physiological response to pain in LLE/COPD exacerbation  Tachycardia much better today  Lower extremity Dopplers negative Echo pending   Gastroenteritis likely due to enteropathogenic E. coli Doubt C. difficile as stool studies positive for enteropathogenic E. Coli No longer on  oral vancomycin-stool studies likely represents colonization and not a real infection Zithromax x 3 days completed Diarrhea has essentially resolved at this point.   COPD exacerbation Better-started on IV steroids yesterday  Moving air well-has some scattered rhonchi.   Continue usual inhaler/bronchodilator regimen on discharge-and tapering oral steroids.   Per patient/spouse-he has been told that he has advanced/end-stage COPD by his pulmonologist at [facility] has been told that he needs to avoid procedures that involve sedation (hence colonoscopy not done for the past several years)   History of CAD s/p CABG 1996 Aspirin/statin   History of PAD-with concern for critical LLE ischemia with  triphasic          +---------+------------------+-----+-----------+-------+ PTA      254               2.51 multiphasic        +---------+------------------+-----+-----------+-------+ DP       254               2.51 multiphasic        +---------+------------------+-----+-----------+-------+ Great Toe                                  Absent  +---------+------------------+-----+-----------+-------+ +-------+-----------+-----------+------------+------------+ ABI/TBIToday's ABIToday's TBIPrevious ABIPrevious TBI  +-------+-----------+-----------+------------+------------+ Right  1.03       Absent                              +-------+-----------+-----------+------------+------------+ Left   [place]         Absent                              +-------+-----------+-----------+------------+------------+  Summary: Right: Resting right ankle-brachial index is within normal range. The right toe-brachial index is abnormal. ABI is likely falsely elevated due to medial calcification. Monophasic flow is noted in the posterior tibial, and dorsalis pedis arteries. Unable to obtain TBI due to absent waveforms. Left: Resting left ankle-brachial index indicates noncompressible left lower extremity arteries. The left toe-brachial index is abnormal. Unable to obtain TBI due to absent waveforms. *See table(s) above for measurements and observations.  Electronically signed by Heath Lark on 07/12/2023 at 10:52:46 PM.    Final    VAS [place] LOWER EXTREMITY VENOUS (DVT)  Result Date: 07/12/2023  Lower Venous DVT Study Patient Name:  CHANDARA RORRER  Date of Exam:   07/11/2023 Medical Rec #: [id number]       Accession #:    [id number] Date of Birth: [birth year]        Patient Gender: M Patient Age:   77 years Exam Location:  [facility] Procedure:      VAS [place] LOWER EXTREMITY VENOUS (DVT) Referring Phys: Jeoffrey Massed --------------------------------------------------------------------------------  Indications: Swelling, and Edema.  Risk Factors: None identified. Comparison Study: No prior study Performing Technologist: Shona Simpson  Examination Guidelines: A complete evaluation includes B-mode imaging, spectral Doppler, color Doppler, and power Doppler as needed of all accessible portions of each vessel. Bilateral testing is considered an integral part of a complete examination. Limited examinations for reoccurring indications may be performed as noted. The reflux portion of the exam is performed with the patient in reverse  Trendelenburg.  +---------+---------------+---------+-----------+----------+--------------+ RIGHT    CompressibilityPhasicitySpontaneityPropertiesThrombus Aging +---------+---------------+---------+-----------+----------+--------------+ CFV      Full           Yes      Yes                                 +---------+---------------+---------+-----------+----------+--------------+ SFJ      Full                                                        +---------+---------------+---------+-----------+----------+--------------+ FV Prox  Full                                                        +---------+---------------+---------+-----------+----------+--------------+  PATIENT DETAILS Name: William Lee Age: 77 y.o. Sex: male Date of Birth: August 01, 1946 MRN: [id number]. Admitting Physician: Buena Irish, MD WUJ:[id number], Curly Rim, MD  Admit Date: 07/09/2023 Discharge date: 07/13/2023  Recommendations for Outpatient Follow-up:  Optimize symptom control Being discharged with Foley catheter-hospice/PCP to consider voiding trial over the next several weeks (especially if patient is stable)  Admitted From:  Home  Disposition: Hospice care   Discharge Condition: fair  CODE STATUS:   Code Status: Limited: Do not attempt resuscitation (DNR) -DNR-LIMITED -Do Not Intubate/DNI    Diet recommendation:  Diet Order             Diet - low sodium heart healthy           DIET DYS 2 Room service appropriate? Yes with Assist; Fluid consistency: Thin  Diet effective now                    Brief Summary: Patient is a 77 y.o.  male with history of COPD on home O2, lung cancer-s/p SBRT August 2022, iron deficiency anemia-PET/CT at [facility] May 2022 with focus of FDG avidity in the ascending colon (felt to be a poor candidate for endoscopic procedures due to advanced COPD)-presented with fatigue/diarrhea/abdominal pain-found to have AKI and subsequently admitted to the hospitalist service.   Significant events: 9/9>> admit to [facility] 9/10>> Foley catheter placed-600 cc immediate urine output. 9/12>> patient opting to go home with hospice-does not want to proceed with any procedures-see discussion below.   Significant studies: 9/9>> CT abdomen/pelvis: Mild small bowel dilatation, diverticulosis without diverticulitis   Significant microbiology data: 9/10>> stool C. difficile antigen: Positive 9/10>> GI pathogen panel: EPEC positive   Procedures: None   Consults: GI Palliative care    Brief Hospital Course: AKI Multifactorial etiology-volume loss due to diarrhea, ARB use, NSAID use and  acute urinary retention UA neg for proteinuria, CT  abdomen negative for hydronephrosis Renal function slowly improved with IVF/Foley catheter placement and other supportive measures  Being discharged home with hospice-if feasible/practical PCP/hospice MD to consider repeat electrolytes in the next several weeks if he is relatively stable. Continue foley on discharge-for comfort-if he stabilizes-PCP/Hospice MD can consider voiding trial   Hyponatremia Secondary to diarrhea/volume loss Improved with IV fluid hydration   Non Anion gap metabolic acidosis Due to AKI/diarrhea Abdominal exam benign Continues to have persistent metabolic acidosis but gradually improving with improvement in renal function/resolution of diarrhea and oral bicarb supplementation. If feasible/practical PCP/hospice MD to consider repeat electrolytes in the next several weeks if he i is relatively stable.   Sinus tachycardia Likely physiological response to pain in LLE/COPD exacerbation  Tachycardia much better today  Lower extremity Dopplers negative Echo pending   Gastroenteritis likely due to enteropathogenic E. coli Doubt C. difficile as stool studies positive for enteropathogenic E. Coli No longer on  oral vancomycin-stool studies likely represents colonization and not a real infection Zithromax x 3 days completed Diarrhea has essentially resolved at this point.   COPD exacerbation Better-started on IV steroids yesterday  Moving air well-has some scattered rhonchi.   Continue usual inhaler/bronchodilator regimen on discharge-and tapering oral steroids.   Per patient/spouse-he has been told that he has advanced/end-stage COPD by his pulmonologist at [facility] has been told that he needs to avoid procedures that involve sedation (hence colonoscopy not done for the past several years)   History of CAD s/p CABG 1996 Aspirin/statin   History of PAD-with concern for critical LLE ischemia with  triphasic          +---------+------------------+-----+----------+--------+ PTA      104               1.03 monophasic         +---------+------------------+-----+----------+--------+ DP       55                0.54 monophasic         +---------+------------------+-----+----------+--------+ Great Toe                                 Absent   +---------+------------------+-----+----------+--------+ +---------+------------------+-----+-----------+-------+ Left     Lt Pressure (mmHg)IndexWaveform   Comment +---------+------------------+-----+-----------+-------+ Brachial 101                    triphasic          +---------+------------------+-----+-----------+-------+ PTA      254               2.51 multiphasic        +---------+------------------+-----+-----------+-------+ DP       254               2.51 multiphasic        +---------+------------------+-----+-----------+-------+ Great Toe                                  Absent  +---------+------------------+-----+-----------+-------+ +-------+-----------+-----------+------------+------------+ ABI/TBIToday's ABIToday's TBIPrevious ABIPrevious TBI +-------+-----------+-----------+------------+------------+ Right  1.03       Absent                               +-------+-----------+-----------+------------+------------+ Left   [name]         Absent                              +-------+-----------+-----------+------------+------------+  Summary: Right: Resting right ankle-brachial index is within normal range. The right toe-brachial index is abnormal. ABI is likely falsely elevated due to medial calcification. Monophasic flow is noted in the posterior tibial, and dorsalis pedis arteries. Unable to obtain TBI due to absent waveforms. Left: Resting left ankle-brachial index indicates noncompressible left lower extremity arteries. The left toe-brachial index is abnormal. Unable to obtain TBI due to absent waveforms. *See table(s) above for measurements and observations.  Electronically signed by Heath Lark on 07/12/2023 at 10:52:46 PM.    Final    VAS [place] LOWER EXTREMITY VENOUS (DVT)  Result Date: 07/12/2023  Lower Venous DVT Study Patient Name:  William Lee  Date of Exam:   07/11/2023 Medical Rec #: [id number]       Accession #:    [id number] Date of Birth: Oct 10, 1946        Patient Gender: M Patient Age:   77 years Exam Location:  [facility] Procedure:      VAS [place] LOWER EXTREMITY VENOUS (DVT) Referring Phys: Jeoffrey Massed --------------------------------------------------------------------------------  Indications: Swelling, and Edema.  Risk Factors: None identified. Comparison Study: No prior study Performing Technologist: Shona Simpson  Examination Guidelines: A complete evaluation includes B-mode imaging, spectral Doppler, color Doppler, and power Doppler as needed of all accessible portions of each vessel. Bilateral testing is considered an integral part of a complete examination. Limited examinations for reoccurring indications may be performed as noted.  triphasic          +---------+------------------+-----+----------+--------+ PTA      104               1.03 monophasic         +---------+------------------+-----+----------+--------+ DP       55                0.54 monophasic         +---------+------------------+-----+----------+--------+ Great Toe                                 Absent   +---------+------------------+-----+----------+--------+ +---------+------------------+-----+-----------+-------+ Left     Lt Pressure (mmHg)IndexWaveform   Comment +---------+------------------+-----+-----------+-------+ Brachial 101                    triphasic          +---------+------------------+-----+-----------+-------+ PTA      254               2.51 multiphasic        +---------+------------------+-----+-----------+-------+ DP       254               2.51 multiphasic        +---------+------------------+-----+-----------+-------+ Great Toe                                  Absent  +---------+------------------+-----+-----------+-------+ +-------+-----------+-----------+------------+------------+ ABI/TBIToday's ABIToday's TBIPrevious ABIPrevious TBI +-------+-----------+-----------+------------+------------+ Right  1.03       Absent                               +-------+-----------+-----------+------------+------------+ Left   [name]         Absent                              +-------+-----------+-----------+------------+------------+  Summary: Right: Resting right ankle-brachial index is within normal range. The right toe-brachial index is abnormal. ABI is likely falsely elevated due to medial calcification. Monophasic flow is noted in the posterior tibial, and dorsalis pedis arteries. Unable to obtain TBI due to absent waveforms. Left: Resting left ankle-brachial index indicates noncompressible left lower extremity arteries. The left toe-brachial index is abnormal. Unable to obtain TBI due to absent waveforms. *See table(s) above for measurements and observations.  Electronically signed by Heath Lark on 07/12/2023 at 10:52:46 PM.    Final    VAS [place] LOWER EXTREMITY VENOUS (DVT)  Result Date: 07/12/2023  Lower Venous DVT Study Patient Name:  William Lee  Date of Exam:   07/11/2023 Medical Rec #: [id number]       Accession #:    [id number] Date of Birth: Oct 10, 1946        Patient Gender: M Patient Age:   77 years Exam Location:  [facility] Procedure:      VAS [place] LOWER EXTREMITY VENOUS (DVT) Referring Phys: Jeoffrey Massed --------------------------------------------------------------------------------  Indications: Swelling, and Edema.  Risk Factors: None identified. Comparison Study: No prior study Performing Technologist: Shona Simpson  Examination Guidelines: A complete evaluation includes B-mode imaging, spectral Doppler, color Doppler, and power Doppler as needed of all accessible portions of each vessel. Bilateral testing is considered an integral part of a complete examination. Limited examinations for reoccurring indications may be performed as noted.

## 2023-07-17 ENCOUNTER — Ambulatory Visit: Payer: Medicare Other | Admitting: Gastroenterology

## 2023-07-25 ENCOUNTER — Ambulatory Visit: Payer: Medicare Other | Admitting: Nurse Practitioner

## 2023-07-25 ENCOUNTER — Ambulatory Visit: Payer: Medicare Other | Admitting: Oncology

## 2023-07-25 ENCOUNTER — Other Ambulatory Visit: Payer: Medicare Other

## 2023-08-09 ENCOUNTER — Ambulatory Visit: Payer: Medicare Other | Admitting: Physician Assistant

## 2023-08-28 ENCOUNTER — Ambulatory Visit (HOSPITAL_COMMUNITY): Admit: 2023-08-28 | Payer: Medicare Other | Admitting: Gastroenterology

## 2023-08-28 ENCOUNTER — Encounter (HOSPITAL_COMMUNITY): Payer: Self-pay

## 2023-08-28 SURGERY — ESOPHAGOGASTRODUODENOSCOPY (EGD) WITH PROPOFOL
Anesthesia: Monitor Anesthesia Care

## 2023-09-04 ENCOUNTER — Encounter: Payer: Self-pay | Admitting: Physician Assistant

## 2023-09-10 NOTE — Progress Notes (Deleted)
09/10/2023 William Lee [id number] 1945-11-27  Referring provider: Kaleen Mask, * Primary GI doctor: Dr. Barron Alvine  ASSESSMENT AND PLAN:   Iron deficiency anemia, unspecified iron deficiency anemia type With ongoing iron deficiency anemia requiring IV iron, FOBT+ Will start back on protonix 40 mg daily for possible GERD as source for bleeding Continue supportive care Patient very high risk for endoscopic procedures, had a very long discussion with the patient and family member in regards to endoscopic evaluation for history of IDA, FOBT + and I discussed risk of electrolyte abnormalities, arrhythmias, heart failure, lung failure including need for ventilation, stroke and heart attack and death but patient states he is interested in pursuing colonoscopy and endoscopy for the iron deficiency anemia for therapeutic purposes.  Will plan in the hospital with Dr. Corey Skains  CEA from [facility] elevated at 18.9  History of adenomatous polyp of colon 2014 2 cm piecemeal TA polyp 2020 virtual colon with no fixed on barium tag polypoid filling defects or annular constricting lesions.  However did have non mobile filling defect noted in the mid to distal transverse colon, possible adherent stool 2022 PET scan avidity in the ascending colon Due to this information and FOBT, IDA, likely would benefit from repeat colon  Gastroesophageal reflux disease without esophagitis Lifestyle changes discussed, avoid NSAIDS, ETOH Start on protonix 40 mg daily Emphasized PPI 30 minutes to an hour before food  Severe chronic obstructive pulmonary disease (HCC) with history of lung cancer, Chronic hypoxemic respiratory failure (HCC) Getting PFTs in Aug again, last PFT 05/2022 PEV1 80% He uses O2 to sleep and as needed, he did walk in here without O2 and was at 95%  Patient Care Team: Kaleen Mask, MD as PCP - General (Family Medicine) Nahser, Deloris Ping, MD as PCP -  Cardiology (Cardiology) Pugh, Erven Colla, MD as Referring Physician (Pulmonary Disease) Noel Gerold Joanna Puff, MD as Referring Physician (Otolaryngology) [facility] as Referring Physician (General Practice) [facility]erring Physician (General Practice) Loni Muse, MD as Attending Physician (Hematology)  HISTORY OF PRESENT ILLNESS: 77 y.o. male with a past medical history of CAD previous bypass 70, COPD on oxygen at home, PVD, lung cancer diagnosis 2022, dyslipidemia, previous papilloma of larynx, anemia and others listed below presents for evaluation of IDA.   09/23/2013 EGD and colonoscopy at the hospital with Dr. Randa Evens for FOBT positive stool-multiple polyps removed ascending, transverse and sigmoid colon, 2 cm polyp transverse colon piecemeal removed with net.  Internal hemorrhoids EGD unremarkable.  Small hiatal hernia.   All polyps were tubular adenomatous polyps. 10/30/2019 CT virtual colonoscopy with ordered by [facility] Dr. showed no fixed on barium tag polypoid filling defects or annular constricting lesions.  Diverticula.There is a non mobile filling defect noted in the mid to distal transverse colon, but this appears to be tagged with barium and likely adherent stool. 03/15/2021 CT PET at [facility] showed Focus of FDG avidity in the ascending colon. Correlate with colonoscopy for further evaluation of possible neoplasm  11/21/2022 office visit with Dr. Elease Hashimoto 03/30/2023 office visit with hematology oncology, lung cancer treated with SBRT in August 22 at [facility] most recent CT no evidence of local recurrence or metastasis 12/15/2022.    Patient had multiple doses of IV iron, October 2023 and most recently 02/06/19/2024 had 2 doses. 02/01/23  HGB 8.1 Ferritin 5, s/p blood transfusion and he felt better.  03/30/2023 Hgb 11.8, MCV 88, iron panel  iron 61, saturation ratio 19, ferritin levels 38 05/18/2023 HGB 11.3  MCV 93.8 Iron 47 Ferritin 30 B12 367 He is  getting two more transfusions scheduled tomorrow.   He states he had PFTs a year ago, getting again August 23rd.  He was having fatigue, DOE prior to April, he states he felt much better after the 1 unit PRBC. He has been having iron infusions but still has DOE.  When he was on iron pills, had darker stool but other wise no melena no hematochezia but states the stool can be reddish/brown intermittent  He was on omeprazole 40 mg BID since 2011, he just stopped 2 years ago, doctor tapered him off.  He has had more indigestion/GERD, no dysphagia, no AB pain.  He will now take omeprazole PRN.  PFTs 06/21/2022 FEV1 0.80, FEV1/FVC 31.51 He states he can walk 4 car lengths with DOE, will have to stop but will not need O2.  He denies blood thinner use, he is on bASA.  Takes Advil PRN, twice a month.  He denies ETOH use.   He denies tobacco use, Quit 32 years ago.  He reports drug use.    He  reports that he quit smoking about 32 years ago. His smoking use included cigarettes. He has never used smokeless tobacco. He reports current drug use. Drug: Marijuana. He reports that he does not drink alcohol.  RELEVANT LABS AND IMAGING: CBC    Component Value Date/Time   WBC 7.3 07/12/2023 0250   RBC 3.07 (L) 07/12/2023 0250   HGB 9.5 (L) 07/12/2023 0250   HGB 12.3 (L) 06/08/2023 1239   HCT 30.1 (L) 07/12/2023 0250   PLT 231 07/12/2023 0250   PLT 279 06/08/2023 1239   MCV 98.0 07/12/2023 0250   MCH 30.9 07/12/2023 0250   MCHC 31.6 07/12/2023 0250   RDW 15.1 07/12/2023 0250   LYMPHSABS 0.2 (L) 07/09/2023 1550   MONOABS 1.0 07/09/2023 1550   EOSABS 0.0 07/09/2023 1550   BASOSABS 0.0 07/09/2023 1550   Recent Labs    02/05/23 1209 02/15/23 1314 03/02/23 1453 03/30/23 1424 05/18/23 1353 06/08/23 1239 07/09/23 1550 07/10/23 0248 07/11/23 0633 07/12/23 0250  HGB 8.3* 10.6* 11.5* 11.8* 11.3* 12.3* 10.7* 9.3* 10.1* 9.5*    CMP     Component Value Date/Time   NA 141 07/13/2023 0717   NA  137 08/29/2019 1105   K 4.2 07/13/2023 0717   CL 111 07/13/2023 0717   CO2 17 (L) 07/13/2023 0717   GLUCOSE 189 (H) 07/13/2023 0717   BUN 97 (H) 07/13/2023 0717   BUN 17 08/29/2019 1105   CREATININE 2.34 (H) 07/13/2023 0717   CREATININE 1.19 06/08/2023 1239   CREATININE 1.09 08/11/2015 1133   CALCIUM 8.7 (L) 07/13/2023 0717   PROT 5.2 (L) 07/09/2023 1550   PROT 6.7 08/29/2019 1105   ALBUMIN 2.5 (L) 07/09/2023 1550   ALBUMIN 4.4 08/29/2019 1105   AST 14 (L) 07/09/2023 1550   AST 25 06/08/2023 1239   ALT 13 07/09/2023 1550   ALT 28 06/08/2023 1239   ALKPHOS 55 07/09/2023 1550   BILITOT 0.8 07/09/2023 1550   BILITOT 0.8 06/08/2023 1239   GFRNONAA 28 (L) 07/13/2023 0717   GFRNONAA >60 06/08/2023 1239   GFRAA 64 08/29/2019 1105      Latest Ref Rng & Units 07/09/2023    3:50 PM 06/08/2023   12:39 PM 08/11/2022    2:30 PM  Hepatic Function  Total Protein 6.5 - 8.1 g/dL 5.2  7.1  7.0   Albumin 3.5 - 5.0 g/dL 2.5  4.2  4.1   AST 15 - 41 U/L 14  25  17    ALT 0 - 44 U/L 13  28  14    Alk Phosphatase 38 - 126 U/L 55  86  84   Total Bilirubin 0.3 - 1.2 mg/dL 0.8  0.8  0.9       Current Medications:   Current Outpatient Medications (Endocrine & Metabolic):    predniSONE (DELTASONE) 20 MG tablet, Take 20 mg by mouth daily as needed (for fluid in lungs per patient).  Current Outpatient Medications (Cardiovascular):    atorvastatin (LIPITOR) 20 MG tablet, Take 20 mg by mouth daily.   diltiazem (CARDIZEM CD) 120 MG 24 hr capsule, Take 120 mg by mouth at bedtime.   midodrine (PROAMATINE) 10 MG tablet, Take 1 tablet (10 mg total) by mouth 3 (three) times daily with meals.  Current Outpatient Medications (Respiratory):    albuterol (PROVENTIL HFA;VENTOLIN HFA) 108 (90 BASE) MCG/ACT inhaler, Inhale 2 puffs into the lungs every 6 (six) hours as needed for wheezing.   Doxylamine-DM (VICKS NYQUIL COUGH) 6.25-15 MG/15ML LIQD, Take 15 mLs by mouth 2 (two) times daily as needed  (congestion/sleep).   Fluticasone-Umeclidin-Vilant 100-62.5-25 MCG/ACT AEPB, Inhale 1 puff into the lungs daily.  Current Outpatient Medications (Analgesics):    aspirin 81 MG tablet, Take 81 mg by mouth daily.   HYDROmorphone (DILAUDID) 2 MG tablet, Take 0.5-1 tablets (1-2 mg total) by mouth every 6 (six) hours as needed for severe pain.   Current Outpatient Medications (Other):    Camphor-Menthol-Methyl Sal (SALONPAS EX), Apply 1 patch topically 2 (two) times daily as needed (pain).   Cholecalciferol 50 MCG (2000 UT) TABS, Take 2,000 Units by mouth daily.   gabapentin (NEURONTIN) 300 MG capsule, Take 300 mg by mouth 2 (two) times daily.   OXYGEN, Inhale 2 L into the lungs daily as needed (shortness of breath).   pantoprazole (PROTONIX) 40 MG tablet, Take 1 tablet (40 mg total) by mouth daily.   polyethylene glycol powder (GLYCOLAX/MIRALAX) 17 GM/SCOOP powder, Take 17 g by mouth daily.   polyvinyl alcohol (LIQUIFILM TEARS) 1.4 % ophthalmic solution, Place 1 drop into both eyes as needed for dry eyes.   potassium chloride 20 MEQ/15ML (10%) SOLN, Take 20 mEq by mouth 2 (two) times daily.    tamsulosin (FLOMAX) 0.4 MG CAPS capsule, Take 1 capsule (0.4 mg total) by mouth daily after supper.  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     Surgical History:  He  has a past surgical history that includes Coronary artery bypass graft; Cholecystectomy (12/1995); papalomas virus removal (3/10 7/10 5/11 10/12); Inguinal hernia repair (05/01/2012); Hernia repair; Esophagogastroduodenoscopy (egd) with propofol (N/A, 09/23/2013); Colonoscopy with propofol (N/A, 09/23/2013); Cardiac catheterization; Cardiac catheterization (N/A, 04/19/2016); Carpal tunnel release (Right, 06/10/2021); and Carpal tunnel release (Left, 08/01/2021). Family History:  His family history includes Bone cancer in his father; Cancer in his brother; Liver cancer in his mother; Lung cancer in his sister.  REVIEW OF SYSTEMS  : All other systems reviewed and negative except where noted in the History of Present Illness.  PHYSICAL EXAM: There were no vitals taken for this visit. General Appearance: Thin appearing, in no apparent distress. Head:   Normocephalic and atraumatic. Eyes:  sclerae anicteric,conjunctive pink  Respiratory: Barrel chest, decreased BS, no wheezing, not on Oxygen, on 95% RA Cardio: RRR with no MRGs. Peripheral pulses intact.  Abdomen: Soft,  Flat ,active bowel sounds. No tenderness . Without guarding and Without rebound. No masses. Rectal: Normal external rectal exam, normal rectal tone, no internal hemorrhoids appreciated, no masses, non tender, brown stool, hemoccult Positive Musculoskeletal: Full ROM, Normal gait. Without edema. Skin:  Dry and intact without significant lesions or rashes Neuro: Alert and  oriented x4;  No focal deficits. Psych:  Cooperative. Normal mood and affect.    Doree Albee, PA-C 9:40 AM

## 2023-09-11 ENCOUNTER — Ambulatory Visit: Payer: Medicare Other | Admitting: Physician Assistant
